# Patient Record
Sex: Female | Born: 1960 | Race: White | Hispanic: No | Marital: Single | Smoking: Former smoker
Health system: Southern US, Community
[De-identification: ages and names within clinical notes are randomized; demographics above are authoritative.]

## PROBLEM LIST (undated history)

## (undated) DIAGNOSIS — E785 Hyperlipidemia, unspecified: Secondary | ICD-10-CM

## (undated) DIAGNOSIS — E119 Type 2 diabetes mellitus without complications: Secondary | ICD-10-CM

## (undated) DIAGNOSIS — I214 Non-ST elevation (NSTEMI) myocardial infarction: Secondary | ICD-10-CM

## (undated) DIAGNOSIS — I10 Essential (primary) hypertension: Secondary | ICD-10-CM

## (undated) DIAGNOSIS — I2510 Atherosclerotic heart disease of native coronary artery without angina pectoris: Secondary | ICD-10-CM

---

## 1998-12-13 ENCOUNTER — Emergency Department (HOSPITAL_COMMUNITY): Admission: EM | Admit: 1998-12-13 | Discharge: 1998-12-13 | Payer: Self-pay | Admitting: Emergency Medicine

## 2002-05-14 ENCOUNTER — Ambulatory Visit (HOSPITAL_BASED_OUTPATIENT_CLINIC_OR_DEPARTMENT_OTHER): Admission: RE | Admit: 2002-05-14 | Discharge: 2002-05-14 | Payer: Self-pay | Admitting: *Deleted

## 2003-08-23 ENCOUNTER — Emergency Department (HOSPITAL_COMMUNITY): Admission: EM | Admit: 2003-08-23 | Discharge: 2003-08-23 | Payer: Self-pay | Admitting: Emergency Medicine

## 2003-08-23 ENCOUNTER — Encounter: Payer: Self-pay | Admitting: Emergency Medicine

## 2016-12-08 ENCOUNTER — Encounter (HOSPITAL_COMMUNITY): Payer: Self-pay

## 2016-12-08 ENCOUNTER — Emergency Department (HOSPITAL_COMMUNITY)
Admission: EM | Admit: 2016-12-08 | Discharge: 2016-12-09 | Disposition: A | Discharge status: Home / Self Care | Payer: Medicaid Other | Attending: Emergency Medicine | Admitting: Emergency Medicine

## 2016-12-08 ENCOUNTER — Emergency Department (HOSPITAL_COMMUNITY): Payer: Medicaid Other

## 2016-12-08 DIAGNOSIS — R079 Chest pain, unspecified: Secondary | ICD-10-CM | POA: Diagnosis not present

## 2016-12-08 DIAGNOSIS — I10 Essential (primary) hypertension: Secondary | ICD-10-CM | POA: Diagnosis not present

## 2016-12-08 DIAGNOSIS — E119 Type 2 diabetes mellitus without complications: Secondary | ICD-10-CM | POA: Insufficient documentation

## 2016-12-08 DIAGNOSIS — Z79899 Other long term (current) drug therapy: Secondary | ICD-10-CM | POA: Insufficient documentation

## 2016-12-08 DIAGNOSIS — K2100 Gastro-esophageal reflux disease with esophagitis, without bleeding: Secondary | ICD-10-CM

## 2016-12-08 DIAGNOSIS — Z7984 Long term (current) use of oral hypoglycemic drugs: Secondary | ICD-10-CM | POA: Insufficient documentation

## 2016-12-08 DIAGNOSIS — Z7982 Long term (current) use of aspirin: Secondary | ICD-10-CM | POA: Diagnosis not present

## 2016-12-08 DIAGNOSIS — R1013 Epigastric pain: Secondary | ICD-10-CM | POA: Diagnosis present

## 2016-12-08 DIAGNOSIS — F17200 Nicotine dependence, unspecified, uncomplicated: Secondary | ICD-10-CM | POA: Insufficient documentation

## 2016-12-08 HISTORY — DX: Type 2 diabetes mellitus without complications: E11.9

## 2016-12-08 HISTORY — DX: Essential (primary) hypertension: I10

## 2016-12-08 LAB — BASIC METABOLIC PANEL
Anion gap: 10 (ref 5–15)
BUN: 13 mg/dL (ref 6–20)
CALCIUM: 9.2 mg/dL (ref 8.9–10.3)
CO2: 23 mmol/L (ref 22–32)
Chloride: 103 mmol/L (ref 101–111)
Creatinine, Ser: 1.03 mg/dL — ABNORMAL HIGH (ref 0.44–1.00)
GFR calc Af Amer: >60 mL/min (ref >60)
GLUCOSE: 203 mg/dL — AB (ref 65–99)
Potassium: 3.6 mmol/L (ref 3.5–5.1)
SODIUM: 136 mmol/L (ref 135–145)

## 2016-12-08 LAB — I-STAT TROPONIN, ED
TROPONIN I, POC: 0 ng/mL (ref 0.00–0.08)
Troponin i, poc: 0 ng/mL (ref 0.00–0.08)

## 2016-12-08 LAB — CBC
HEMATOCRIT: 38.4 % (ref 36.0–46.0)
Hemoglobin: 12.9 g/dL (ref 12.0–15.0)
MCH: 29 pg (ref 26.0–34.0)
MCHC: 33.6 g/dL (ref 30.0–36.0)
MCV: 86.3 fL (ref 78.0–100.0)
PLATELETS: 177 10*3/uL (ref 150–400)
RBC: 4.45 MIL/uL (ref 3.87–5.11)
RDW: 14.6 % (ref 11.5–15.5)
WBC: 9.2 10*3/uL (ref 4.0–10.5)

## 2016-12-08 MED ORDER — ACETAMINOPHEN 325 MG PO TABS
650.0000 mg | ORAL_TABLET | Freq: Once | ORAL | Status: AC
  Administered 2016-12-09: 650 mg via ORAL
  Filled 2016-12-08: qty 2

## 2016-12-08 MED ORDER — GI COCKTAIL ~~LOC~~
30.0000 mL | Freq: Once | ORAL | Status: AC
  Administered 2016-12-09: 30 mL via ORAL
  Filled 2016-12-08: qty 30

## 2016-12-08 MED ORDER — NITROGLYCERIN 0.4 MG SL SUBL: 0.4000 mg | SUBLINGUAL_TABLET | SUBLINGUAL | Status: DC | PRN

## 2016-12-08 NOTE — ED Provider Notes (Signed)
Medical screening examination/treatment/procedure(s) were conducted as a shared visit with non-physician practitioner(s) and myself.  I personally evaluated the patient during the encounter.   EKG Interpretation  Date/Time:  Wednesday December 08 2016 19:38:48 EST Ventricular Rate:  61 PR Interval:  174 QRS Duration: 82 QT Interval:  422 QTC Calculation: 424 R Axis:   9 Text Interpretation:  Normal sinus rhythm Normal ECG No significant change since last tracing Confirmed by Paitlyn Mcclatchey,  DO, Cataleia Gade [phone]([id number]) on 12/08/2016 11:14:43 PM      Pt is a 56 y.o. female with history of CAD status post stents on Plavix who presents emergency department with 2 days of epigastric abdominal pain that radiated into her left chest tonight at 7 PM. Described as a burning pain with nausea. No significant shortness of breath, diaphoresis. States this does not feel at all like her prior heart attack. No pressure, tightness. Has had some left arm pain but she does not feel that this is related to her chest pain. Patient's exam is unremarkable. Mildly bradycardic in the upper 40s intermittently but is on a beta blocker. Otherwise hemodynamically stable. Reports compliance with her Plavix. First troponin negative. Chest x-ray clear. EKG shows no new ischemic abnormality, arrhythmia or interval change. Plan is to repeat second troponin, treat any residual symptoms with GI cocktail. This seems less likely cardiac in nature. It is not pleuritic or exertional. If second troponin negative, pain resolved the patient still hemodynamically stable, plan is to discharge home with close cardiology follow-up. Patient and daughter at bedside are comfortable with this plan.   Layla MawKristen N Yaritza Leist, DO 12/08/16 2329

## 2016-12-08 NOTE — ED Provider Notes (Addendum)
[facility]-EMERGENCY DEPT Provider Note   CSN: [id number] Arrival date & time: 12/08/16  1932     History   Chief Complaint Chief Complaint  Patient presents with  . Chest Pain    HPI Alice Bennett is a 56 y.o. female.  Patient with history of NIDDM, MI (stents 2015), HLD, HTN, kidney mass, tobacco abuse presents with chest pain x 2 days. Describes pain was initially a burning type epigastric pain until tonight when it became a pressure-like, left sided, constant chest pain, now 5/10. She did not take anything at home for pain. No aggravating or alleviating factors.  She has nausea no vomiting, no headache, and no sob. She reports left arm aching x 2 days. She has a history of MI and reports current pain is not the same.    The history is provided by the patient. No language interpreter was used.    Past Medical History:  Diagnosis Date  . Diabetes mellitus without complication (HCC)   . Hypertension     There are no active problems to display for this patient.   History reviewed. No pertinent surgical history.  OB History    No data available       Home Medications    Prior to Admission medications   Medication Sig Start Date End Date Taking? Authorizing Provider  aspirin EC 81 MG tablet Take 81 mg by mouth daily.   Yes Historical Provider, MD  clonazePAM (KLONOPIN) 1 MG tablet Take 1 mg by mouth 2 (two) times daily.   Yes Historical Provider, MD  clopidogrel (PLAVIX) 75 MG tablet Take 75 mg by mouth daily.   Yes Historical Provider, MD  esomeprazole (NEXIUM) 20 MG capsule Take 40 mg by mouth daily at 12 noon.    Yes Historical Provider, MD  gabapentin (NEURONTIN) 300 MG capsule Take 300 mg by mouth 3 (three) times daily.   Yes Historical Provider, MD  hydrochlorothiazide (HYDRODIURIL) 25 MG tablet Take 25 mg by mouth daily.   Yes Historical Provider, MD  lisinopril (PRINIVIL,ZESTRIL) 5 MG tablet Take 5 mg by mouth daily.   Yes Historical Provider, MD  metFORMIN  (GLUCOPHAGE) 1000 MG tablet Take 1,000 mg by mouth 2 (two) times daily with a meal.   Yes Historical Provider, MD  metoprolol tartrate (LOPRESSOR) 25 MG tablet Take 25 mg by mouth 2 (two) times daily.   Yes Historical Provider, MD  nitroGLYCERIN (NITROSTAT) 0.4 MG SL tablet Place 0.4 mg under the tongue every 5 (five) minutes as needed for chest pain.   Yes Historical Provider, MD  pravastatin (PRAVACHOL) 40 MG tablet Take 40 mg by mouth every evening.   Yes Historical Provider, MD  sertraline (ZOLOFT) 100 MG tablet Take 100 mg by mouth daily.   Yes Historical Provider, MD    Family History History reviewed. No pertinent family history.  Social History Social History  Substance Use Topics  . Smoking status: Current Every Day Smoker  . Smokeless tobacco: Never Used  . Alcohol use No     Allergies   Codeine   Review of Systems Review of Systems  Constitutional: Negative for chills and fever.  HENT: Negative.   Respiratory: Negative.  Negative for cough and shortness of breath.   Cardiovascular: Positive for chest pain.  Gastrointestinal: Positive for abdominal pain and nausea. Negative for vomiting.  Musculoskeletal: Negative.   Skin: Negative.   Neurological: Positive for weakness.     Physical Exam Updated Vital Signs BP 135/72 (BP Location: Right  Arm)   Pulse (!) 58   Temp 98.2 F (36.8 C) (Oral)   Resp 18   Ht 5\' 9"  (1.753 m)   Wt 99.8 kg   SpO2 97%   BMI 32.49 kg/m   Physical Exam  Constitutional: She is oriented to person, place, and time. She appears well-developed and well-nourished. No distress.  HENT:  Head: Normocephalic.  Neck: Normal range of motion. Neck supple.  Cardiovascular: Normal rate and regular rhythm.   No murmur heard. Pulmonary/Chest: Effort normal and breath sounds normal. She has no wheezes. She has no rales. She exhibits no tenderness.  Abdominal: Soft. Bowel sounds are normal. There is no tenderness. There is no rebound and no  guarding.  Musculoskeletal: Normal range of motion. She exhibits no edema.  Neurological: She is alert and oriented to person, place, and time.  Skin: Skin is warm and dry. No rash noted.  Psychiatric: She has a normal mood and affect.     ED Treatments / Results  Labs (all labs ordered are listed, but only abnormal results are displayed) Labs Reviewed  BASIC METABOLIC PANEL - Abnormal; Notable for the following:       Result Value   Glucose, Bld 203 (*)    Creatinine, Ser 1.03 (*)    All other components within normal limits  CBC  I-STAT TROPOININ, ED   Results for orders placed or performed during the hospital encounter of 12/08/16  Basic metabolic panel  Result Value Ref Range   Sodium 136 135 - 145 mmol/L   Potassium 3.6 3.5 - 5.1 mmol/L   Chloride 103 101 - 111 mmol/L   CO2 23 22 - 32 mmol/L   Glucose, Bld 203 (H) 65 - 99 mg/dL   BUN 13 6 - 20 mg/dL   Creatinine, Ser 1.321.03 (H) 0.44 - 1.00 mg/dL   Calcium 9.2 8.9 - 44.010.3 mg/dL   GFR calc non Af Amer >60 >60 mL/min   GFR calc Af Amer >60 >60 mL/min   Anion gap 10 5 - 15  CBC  Result Value Ref Range   WBC 9.2 4.0 - 10.5 K/uL   RBC 4.45 3.87 - 5.11 MIL/uL   Hemoglobin 12.9 12.0 - 15.0 g/dL   HCT 10.238.4 72.536.0 - 36.646.0 %   MCV 86.3 78.0 - 100.0 fL   MCH 29.0 26.0 - 34.0 pg   MCHC 33.6 30.0 - 36.0 g/dL   RDW 44.014.6 34.711.5 - 42.515.5 %   Platelets 177 150 - 400 K/uL  I-stat troponin, ED  Result Value Ref Range   Troponin i, poc 0.00 0.00 - 0.08 ng/mL   Comment 3          I-Stat Troponin, ED [facility])  Result Value Ref Range   Troponin i, poc 0.00 0.00 - 0.08 ng/mL   Comment 3            EKG  EKG Interpretation None       Radiology Dg Chest 2 View  Result Date: 12/08/2016 CLINICAL DATA:  Acute onset of chest pain under both breasts, with burning and pain at the left biceps. Insomnia. EXAM: CHEST  2 VIEW COMPARISON:  Chest radiograph performed 08/03/2016 FINDINGS: The lungs are well-aerated and clear. There is no  evidence of focal opacification, pleural effusion or pneumothorax. The heart is normal in size; the mediastinal contour is within normal limits. No acute osseous abnormalities are seen. IMPRESSION: No acute cardiopulmonary process seen. Electronically Signed   By: Leotis ShamesJeffery  Chang M.D.   On: 12/08/2016 21:00    Procedures Procedures (including critical care time)  Medications Ordered in ED Medications - No data to display   Initial Impression / Assessment and Plan / ED Course  I have reviewed the triage vital signs and the nursing notes.  Pertinent labs & imaging results that were available during my care of the patient were reviewed by me and considered in my medical decision making (see chart for details).     Patient presents with pain initially described as epigastric burning, which continues but now extends to left chest. No SOB, diaphoresis. No vomiting though she has nausea.   EKG is non-ischemic, troponin and delta troponin are negative. GI cocktail with some relief. This is not felt to be ACS.  She is evaluated by Dr. Elesa Massed and is felt appropriate for discharge home. She is from [facility] and will be referred to cardiology to establish care. Recommend adding Pepcid or Zantac to current medication regimen.   Final Clinical Impressions(s) / ED Diagnoses   Final diagnoses:  None  1. Nonspecific chest pain.  New Prescriptions New Prescriptions   No medications on file     Elpidio Anis, PA-C 12/09/16 0019    Elpidio Anis, PA-C 12/22/16 0002    Layla Maw Ward, DO 12/23/16 2303

## 2016-12-08 NOTE — ED Triage Notes (Signed)
Pt complaining of L sided chest pain that radiates to L arm. Pt states hx of MI in 2016. Pt states some nausea, no vomiting. Pt states intermittent chest pain x 1 week, worsening today.

## 2017-06-09 IMAGING — DX DG CHEST 2V
2 series · 2 of 2 positions shown · non-contrast
Comparison: Chest radiograph performed 08/03/2016

CLINICAL DATA: Acute onset of chest pain under both breasts, with
burning and pain at the left biceps. Insomnia.

EXAM:
CHEST  2 VIEW

[w chest pa]
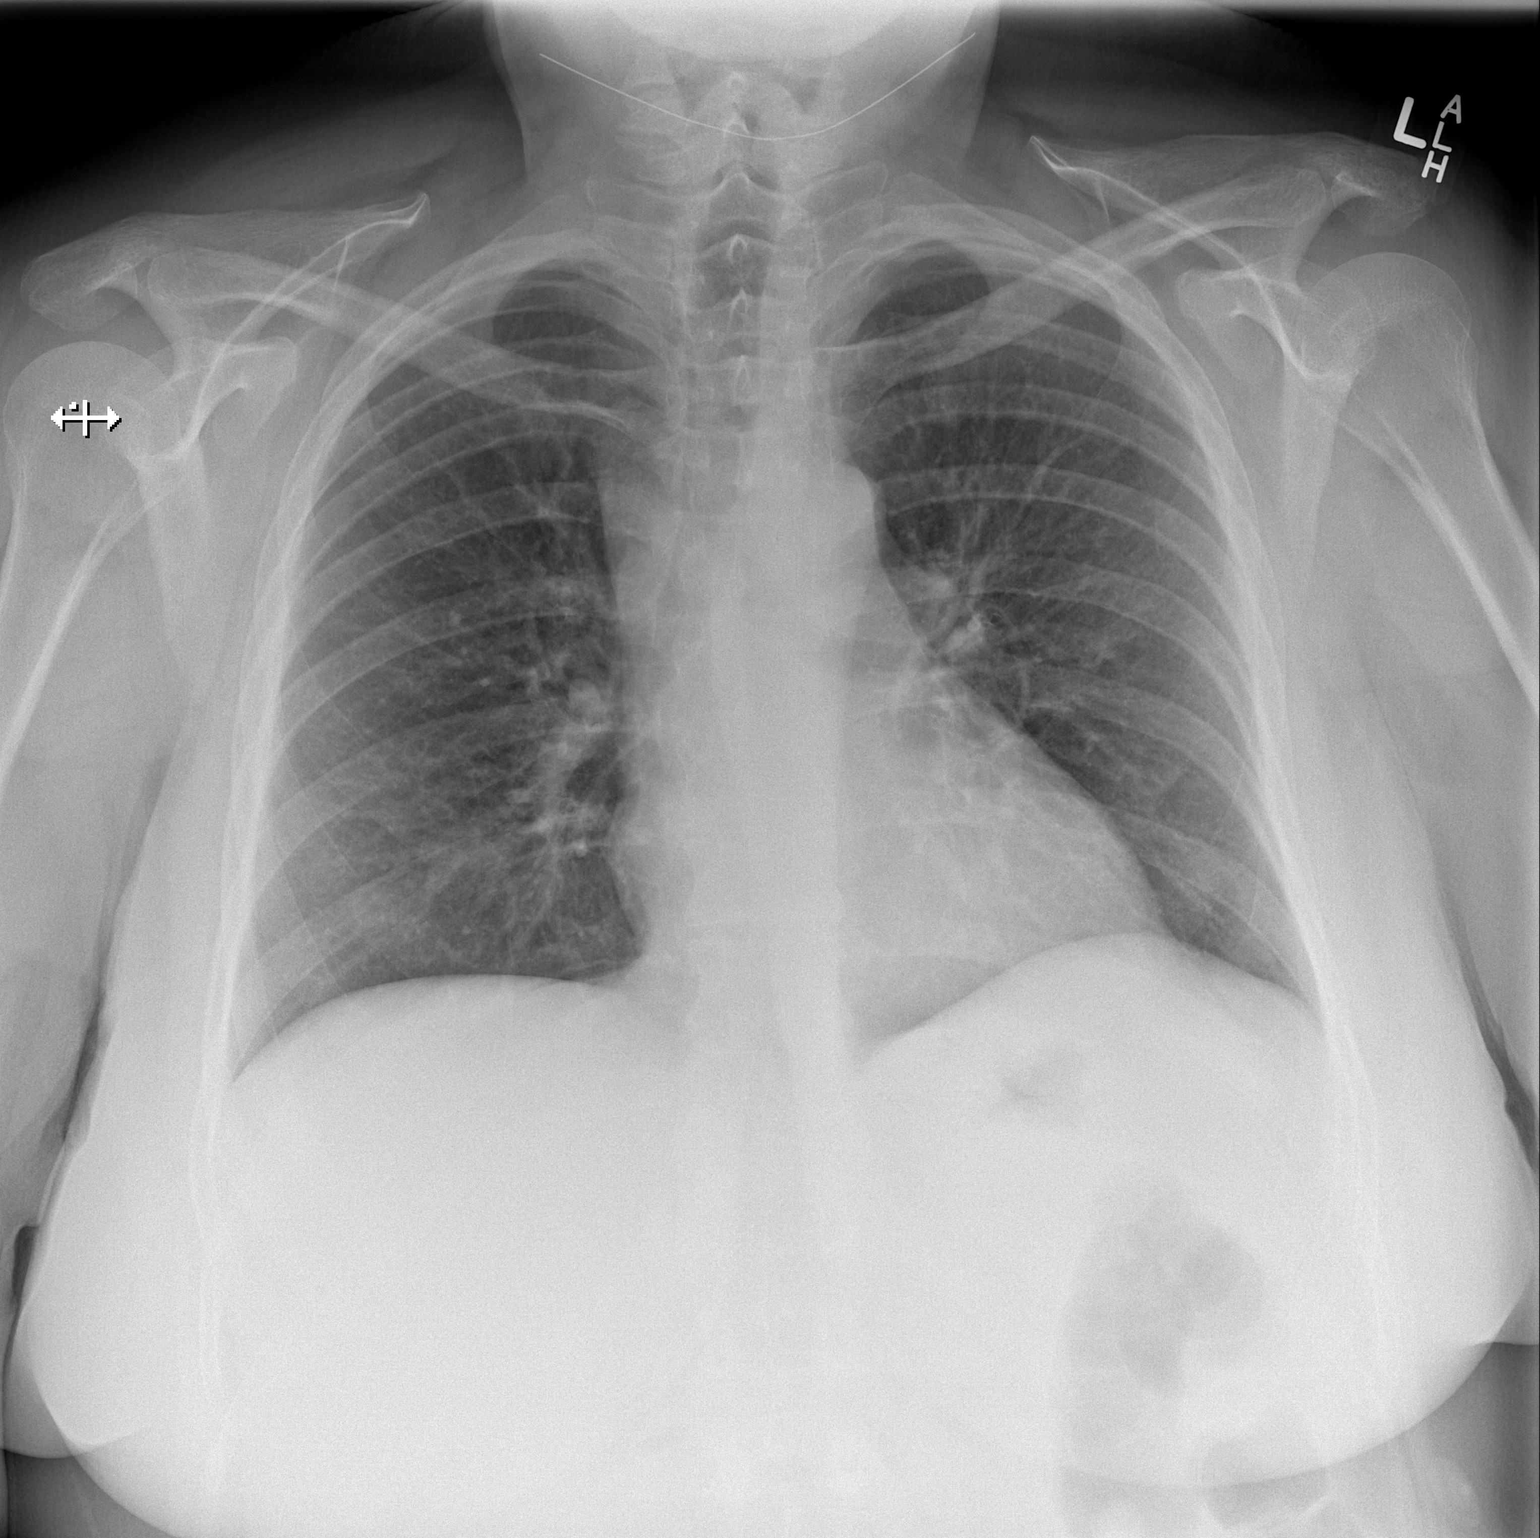

[w chest lat]
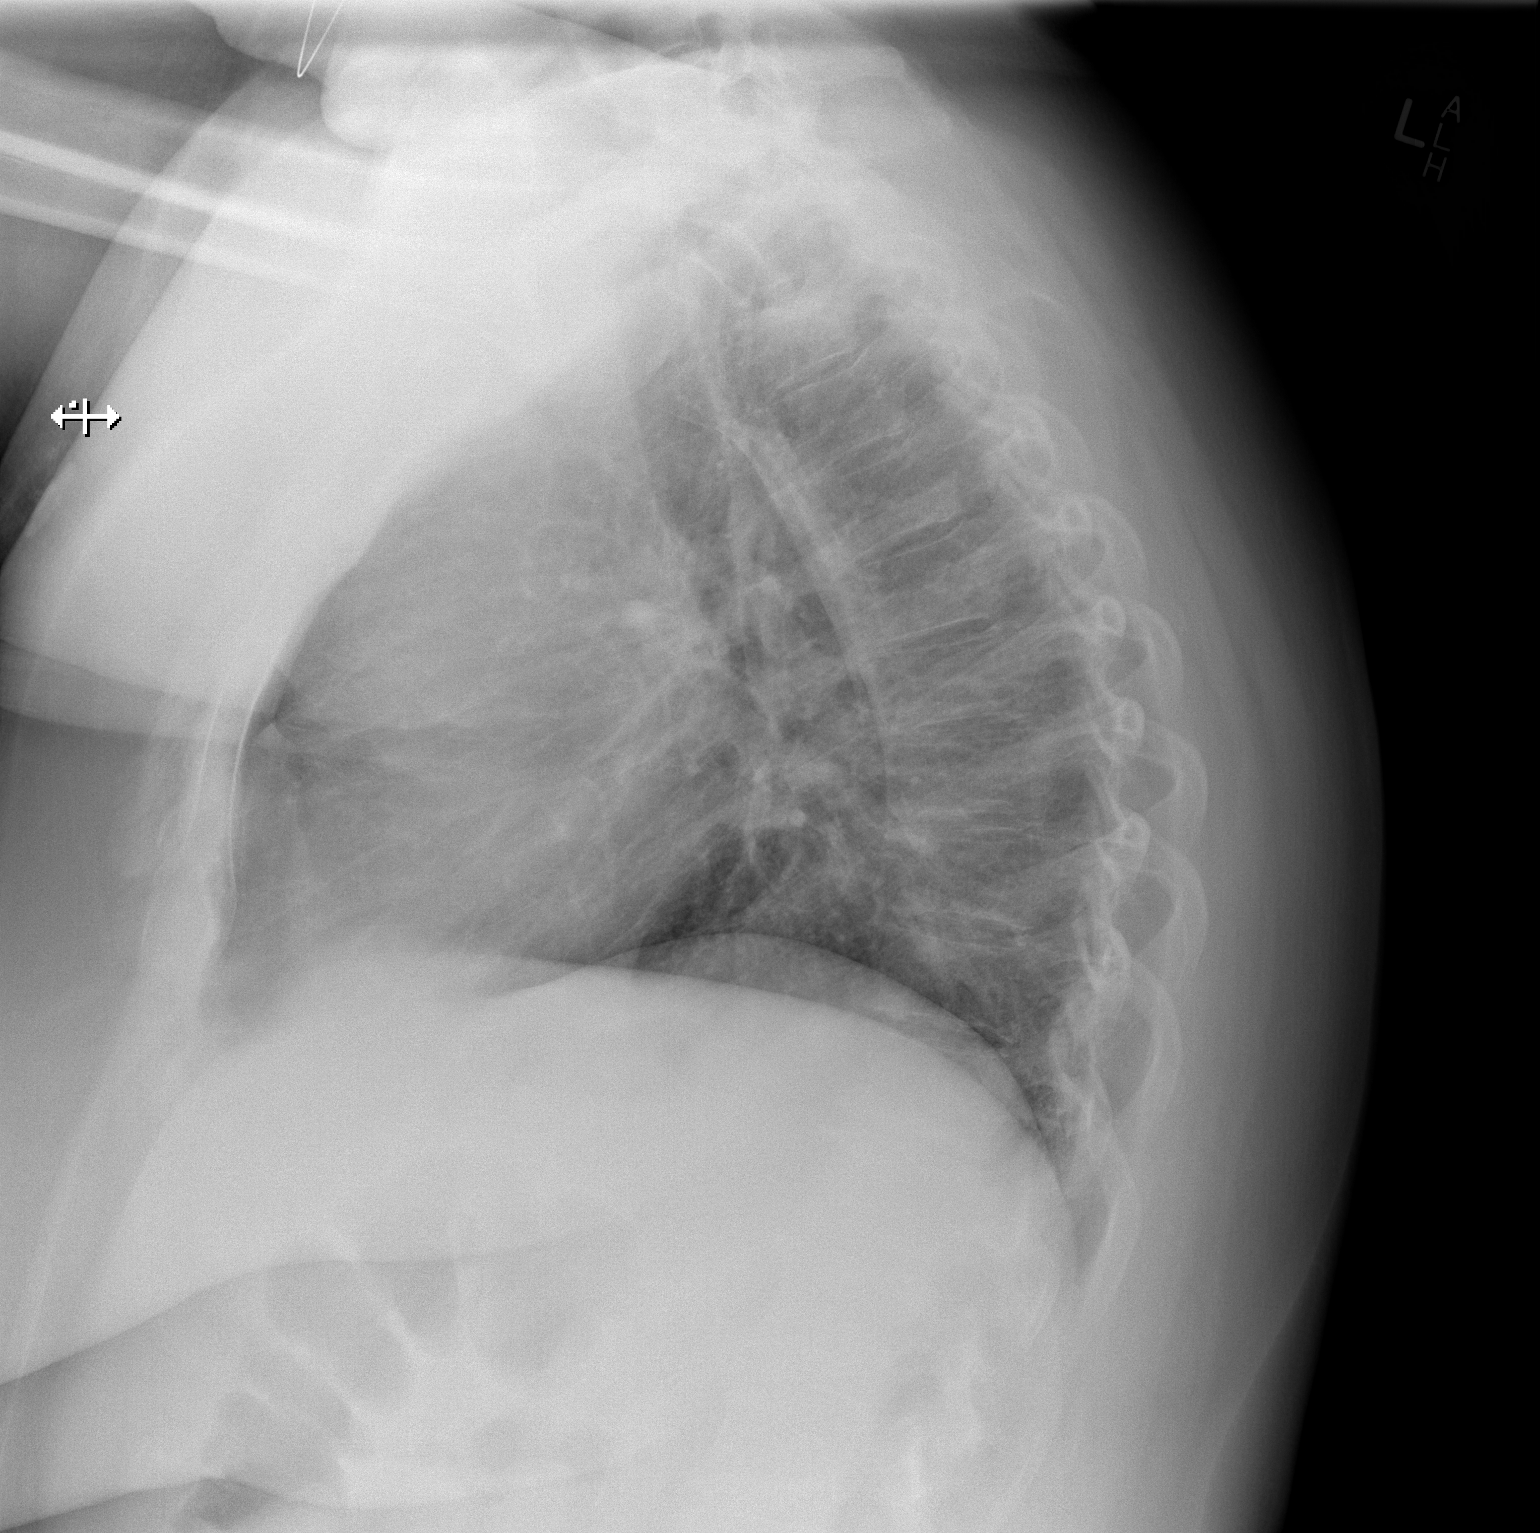

[2 of 2 positions shown; findings below may reference images not displayed]

FINDINGS: The lungs are well-aerated and clear. There is no evidence of focal
opacification, pleural effusion or pneumothorax.

The heart is normal in size; the mediastinal contour is within
normal limits. No acute osseous abnormalities are seen.
IMPRESSION: No acute cardiopulmonary process seen.

## 2017-07-09 ENCOUNTER — Inpatient Hospital Stay (HOSPITAL_COMMUNITY)
Admission: AD | Admit: 2017-07-09 | Discharge: 2017-07-12 | DRG: 247 | Disposition: A | Discharge status: Home / Self Care | Payer: Medicaid Other | Source: Other Acute Inpatient Hospital | Attending: Cardiovascular Disease | Admitting: Cardiovascular Disease

## 2017-07-09 ENCOUNTER — Encounter (HOSPITAL_COMMUNITY): Payer: Self-pay | Admitting: Cardiovascular Disease

## 2017-07-09 DIAGNOSIS — F419 Anxiety disorder, unspecified: Secondary | ICD-10-CM | POA: Diagnosis not present

## 2017-07-09 DIAGNOSIS — E1169 Type 2 diabetes mellitus with other specified complication: Secondary | ICD-10-CM

## 2017-07-09 DIAGNOSIS — Z7982 Long term (current) use of aspirin: Secondary | ICD-10-CM

## 2017-07-09 DIAGNOSIS — I5181 Takotsubo syndrome: Secondary | ICD-10-CM | POA: Diagnosis present

## 2017-07-09 DIAGNOSIS — I152 Hypertension secondary to endocrine disorders: Secondary | ICD-10-CM

## 2017-07-09 DIAGNOSIS — I10 Essential (primary) hypertension: Secondary | ICD-10-CM | POA: Diagnosis present

## 2017-07-09 DIAGNOSIS — Z6835 Body mass index (BMI) 35.0-35.9, adult: Secondary | ICD-10-CM

## 2017-07-09 DIAGNOSIS — Z955 Presence of coronary angioplasty implant and graft: Secondary | ICD-10-CM

## 2017-07-09 DIAGNOSIS — Z885 Allergy status to narcotic agent status: Secondary | ICD-10-CM

## 2017-07-09 DIAGNOSIS — Z7984 Long term (current) use of oral hypoglycemic drugs: Secondary | ICD-10-CM

## 2017-07-09 DIAGNOSIS — R079 Chest pain, unspecified: Secondary | ICD-10-CM

## 2017-07-09 DIAGNOSIS — J811 Chronic pulmonary edema: Secondary | ICD-10-CM | POA: Diagnosis not present

## 2017-07-09 DIAGNOSIS — W19XXXA Unspecified fall, initial encounter: Secondary | ICD-10-CM | POA: Diagnosis not present

## 2017-07-09 DIAGNOSIS — Z7902 Long term (current) use of antithrombotics/antiplatelets: Secondary | ICD-10-CM | POA: Diagnosis not present

## 2017-07-09 DIAGNOSIS — I214 Non-ST elevation (NSTEMI) myocardial infarction: Principal | ICD-10-CM

## 2017-07-09 DIAGNOSIS — E118 Type 2 diabetes mellitus with unspecified complications: Secondary | ICD-10-CM

## 2017-07-09 DIAGNOSIS — Z79899 Other long term (current) drug therapy: Secondary | ICD-10-CM | POA: Diagnosis not present

## 2017-07-09 DIAGNOSIS — I25110 Atherosclerotic heart disease of native coronary artery with unstable angina pectoris: Secondary | ICD-10-CM | POA: Diagnosis present

## 2017-07-09 DIAGNOSIS — E876 Hypokalemia: Secondary | ICD-10-CM | POA: Diagnosis present

## 2017-07-09 DIAGNOSIS — E785 Hyperlipidemia, unspecified: Secondary | ICD-10-CM

## 2017-07-09 DIAGNOSIS — E669 Obesity, unspecified: Secondary | ICD-10-CM | POA: Diagnosis present

## 2017-07-09 DIAGNOSIS — F17210 Nicotine dependence, cigarettes, uncomplicated: Secondary | ICD-10-CM | POA: Diagnosis present

## 2017-07-09 DIAGNOSIS — I255 Ischemic cardiomyopathy: Secondary | ICD-10-CM | POA: Diagnosis present

## 2017-07-09 DIAGNOSIS — E119 Type 2 diabetes mellitus without complications: Secondary | ICD-10-CM | POA: Diagnosis present

## 2017-07-09 DIAGNOSIS — I200 Unstable angina: Secondary | ICD-10-CM | POA: Diagnosis present

## 2017-07-09 DIAGNOSIS — E782 Mixed hyperlipidemia: Secondary | ICD-10-CM | POA: Diagnosis present

## 2017-07-09 DIAGNOSIS — Z8679 Personal history of other diseases of the circulatory system: Secondary | ICD-10-CM

## 2017-07-09 HISTORY — DX: Atherosclerotic heart disease of native coronary artery without angina pectoris: I25.10

## 2017-07-09 HISTORY — DX: Non-ST elevation (NSTEMI) myocardial infarction: I21.4

## 2017-07-09 HISTORY — DX: Hyperlipidemia, unspecified: E78.5

## 2017-07-09 LAB — CBC WITH DIFFERENTIAL/PLATELET
Basophils Absolute: 0 10*3/uL (ref 0.0–0.1)
Basophils Relative: 0 %
Eosinophils Absolute: 0.1 10*3/uL (ref 0.0–0.7)
Eosinophils Relative: 0 %
HCT: 35.4 % — ABNORMAL LOW (ref 36.0–46.0)
Hemoglobin: 11.7 g/dL — ABNORMAL LOW (ref 12.0–15.0)
Lymphocytes Relative: 23 %
Lymphs Abs: 2.6 10*3/uL (ref 0.7–4.0)
MCH: 28.5 pg (ref 26.0–34.0)
MCHC: 33.1 g/dL (ref 30.0–36.0)
MCV: 86.3 fL (ref 78.0–100.0)
Monocytes Absolute: 0.4 10*3/uL (ref 0.1–1.0)
Monocytes Relative: 4 %
Neutro Abs: 8.2 10*3/uL — ABNORMAL HIGH (ref 1.7–7.7)
Neutrophils Relative %: 73 %
Platelets: 180 10*3/uL (ref 150–400)
RBC: 4.1 MIL/uL (ref 3.87–5.11)
RDW: 14.7 % (ref 11.5–15.5)
WBC: 11.3 10*3/uL — ABNORMAL HIGH (ref 4.0–10.5)

## 2017-07-09 LAB — COMPREHENSIVE METABOLIC PANEL
ALT: 14 U/L (ref 14–54)
AST: 20 U/L (ref 15–41)
Albumin: 3.4 g/dL — ABNORMAL LOW (ref 3.5–5.0)
Alkaline Phosphatase: 53 U/L (ref 38–126)
Anion gap: 8 (ref 5–15)
BUN: 15 mg/dL (ref 6–20)
CO2: 24 mmol/L (ref 22–32)
Calcium: 9.1 mg/dL (ref 8.9–10.3)
Chloride: 106 mmol/L (ref 101–111)
Creatinine, Ser: 0.92 mg/dL (ref 0.44–1.00)
GFR calc Af Amer: >60 mL/min (ref >60)
GFR calc non Af Amer: >60 mL/min (ref >60)
Glucose, Bld: 109 mg/dL — ABNORMAL HIGH (ref 65–99)
Potassium: 3.6 mmol/L (ref 3.5–5.1)
Sodium: 138 mmol/L (ref 135–145)
Total Bilirubin: 0.3 mg/dL (ref 0.3–1.2)
Total Protein: 7.1 g/dL (ref 6.5–8.1)

## 2017-07-09 LAB — TROPONIN I
Troponin I: 0.35 ng/mL (ref <0.03)
Troponin I: 0.29 ng/mL (ref <0.03)

## 2017-07-09 LAB — HEPARIN LEVEL (UNFRACTIONATED): HEPARIN UNFRACTIONATED: 0.2 [IU]/mL — AB (ref 0.30–0.70)

## 2017-07-09 LAB — MRSA PCR SCREENING: MRSA BY PCR: NEGATIVE

## 2017-07-09 LAB — GLUCOSE, CAPILLARY: GLUCOSE-CAPILLARY: 136 mg/dL — AB (ref 65–99)

## 2017-07-09 MED ORDER — ACETAMINOPHEN 325 MG PO TABS
650.0000 mg | ORAL_TABLET | ORAL | Status: DC | PRN
  Administered 2017-07-09 – 2017-07-10 (×3): 650 mg via ORAL
  Filled 2017-07-09 (×3): qty 2

## 2017-07-09 MED ORDER — INSULIN ASPART 100 UNIT/ML ~~LOC~~ SOLN: 0.0000 [IU] | Freq: Every day | SUBCUTANEOUS | Status: DC

## 2017-07-09 MED ORDER — ASPIRIN 81 MG PO CHEW: 324.0000 mg | CHEWABLE_TABLET | ORAL | Status: DC

## 2017-07-09 MED ORDER — SERTRALINE HCL 100 MG PO TABS
100.0000 mg | ORAL_TABLET | Freq: Every day | ORAL | Status: DC
  Administered 2017-07-10 – 2017-07-12 (×3): 100 mg via ORAL
  Filled 2017-07-09 (×3): qty 1

## 2017-07-09 MED ORDER — PANTOPRAZOLE SODIUM 40 MG PO TBEC
40.0000 mg | DELAYED_RELEASE_TABLET | Freq: Every day | ORAL | Status: DC
  Administered 2017-07-10 – 2017-07-12 (×3): 40 mg via ORAL
  Filled 2017-07-09 (×3): qty 1

## 2017-07-09 MED ORDER — ASPIRIN 300 MG RE SUPP: 300.0000 mg | RECTAL | Status: DC

## 2017-07-09 MED ORDER — ONDANSETRON HCL 4 MG/2ML IJ SOLN: 4.0000 mg | Freq: Four times a day (QID) | INTRAMUSCULAR | Status: DC | PRN

## 2017-07-09 MED ORDER — ASPIRIN EC 81 MG PO TBEC
81.0000 mg | DELAYED_RELEASE_TABLET | Freq: Every day | ORAL | Status: DC
  Administered 2017-07-10: 81 mg via ORAL
  Filled 2017-07-09: qty 1

## 2017-07-09 MED ORDER — HEPARIN BOLUS VIA INFUSION
1000.0000 [IU] | Freq: Once | INTRAVENOUS | Status: AC
  Administered 2017-07-09: 1000 [IU] via INTRAVENOUS
  Filled 2017-07-09: qty 1000

## 2017-07-09 MED ORDER — NITROGLYCERIN IN D5W 200-5 MCG/ML-% IV SOLN
0.0000 ug/min | INTRAVENOUS | Status: DC
  Administered 2017-07-09: 15 ug/min via INTRAVENOUS
  Filled 2017-07-09: qty 250

## 2017-07-09 MED ORDER — HYDROCHLOROTHIAZIDE 25 MG PO TABS
25.0000 mg | ORAL_TABLET | Freq: Every day | ORAL | Status: DC
  Administered 2017-07-10: 25 mg via ORAL
  Filled 2017-07-09: qty 1

## 2017-07-09 MED ORDER — ATORVASTATIN CALCIUM 80 MG PO TABS
80.0000 mg | ORAL_TABLET | Freq: Every day | ORAL | Status: DC
  Administered 2017-07-09 – 2017-07-11 (×3): 80 mg via ORAL
  Filled 2017-07-09 (×3): qty 1

## 2017-07-09 MED ORDER — CLONAZEPAM 0.5 MG PO TABS
1.0000 mg | ORAL_TABLET | Freq: Two times a day (BID) | ORAL | Status: DC
  Administered 2017-07-09 – 2017-07-12 (×6): 1 mg via ORAL
  Filled 2017-07-09: qty 1
  Filled 2017-07-09: qty 2
  Filled 2017-07-09 (×2): qty 1
  Filled 2017-07-09: qty 2
  Filled 2017-07-09: qty 1

## 2017-07-09 MED ORDER — INSULIN ASPART 100 UNIT/ML ~~LOC~~ SOLN
0.0000 [IU] | Freq: Three times a day (TID) | SUBCUTANEOUS | Status: DC
  Administered 2017-07-10: 3 [IU] via SUBCUTANEOUS

## 2017-07-09 MED ORDER — GABAPENTIN 300 MG PO CAPS
300.0000 mg | ORAL_CAPSULE | Freq: Three times a day (TID) | ORAL | Status: DC
  Administered 2017-07-09 – 2017-07-10 (×3): 300 mg via ORAL
  Filled 2017-07-09 (×3): qty 1

## 2017-07-09 MED ORDER — PNEUMOCOCCAL VAC POLYVALENT 25 MCG/0.5ML IJ INJ: 0.5000 mL | INJECTION | INTRAMUSCULAR | Status: DC | PRN

## 2017-07-09 MED ORDER — ORAL CARE MOUTH RINSE
15.0000 mL | Freq: Two times a day (BID) | OROMUCOSAL | Status: DC
  Administered 2017-07-10 – 2017-07-12 (×3): 15 mL via OROMUCOSAL

## 2017-07-09 MED ORDER — CLOPIDOGREL BISULFATE 75 MG PO TABS
75.0000 mg | ORAL_TABLET | Freq: Every day | ORAL | Status: DC
  Administered 2017-07-10 – 2017-07-12 (×3): 75 mg via ORAL
  Filled 2017-07-09 (×3): qty 1

## 2017-07-09 MED ORDER — METOPROLOL TARTRATE 25 MG PO TABS
25.0000 mg | ORAL_TABLET | Freq: Two times a day (BID) | ORAL | Status: DC
  Administered 2017-07-10: 25 mg via ORAL
  Filled 2017-07-09 (×2): qty 1

## 2017-07-09 MED ORDER — ASPIRIN EC 81 MG PO TBEC: 81.0000 mg | DELAYED_RELEASE_TABLET | Freq: Every day | ORAL | Status: DC

## 2017-07-09 MED ORDER — ALPRAZOLAM 0.25 MG PO TABS
0.2500 mg | ORAL_TABLET | Freq: Two times a day (BID) | ORAL | Status: DC | PRN
  Administered 2017-07-10: 0.25 mg via ORAL
  Filled 2017-07-09: qty 1

## 2017-07-09 MED ORDER — HEPARIN (PORCINE) IN NACL 100-0.45 UNIT/ML-% IJ SOLN
1550.0000 [IU]/h | INTRAMUSCULAR | Status: DC
  Administered 2017-07-09: 1300 [IU]/h via INTRAVENOUS
  Administered 2017-07-10: 1550 [IU]/h via INTRAVENOUS
  Filled 2017-07-09 (×2): qty 250

## 2017-07-09 MED ORDER — ZOLPIDEM TARTRATE 5 MG PO TABS: 5.0000 mg | ORAL_TABLET | Freq: Every evening | ORAL | Status: DC | PRN

## 2017-07-09 MED ORDER — LISINOPRIL 5 MG PO TABS
5.0000 mg | ORAL_TABLET | Freq: Every day | ORAL | Status: DC
  Administered 2017-07-10 – 2017-07-12 (×3): 5 mg via ORAL
  Filled 2017-07-09 (×3): qty 1

## 2017-07-09 MED ORDER — NITROGLYCERIN 0.4 MG SL SUBL
0.4000 mg | SUBLINGUAL_TABLET | SUBLINGUAL | Status: DC | PRN
  Administered 2017-07-10: 0.4 mg via SUBLINGUAL
  Filled 2017-07-09: qty 1

## 2017-07-09 NOTE — Progress Notes (Signed)
ANTICOAGULATION CONSULT NOTE - Initial Consult  Pharmacy Consult for heparin Indication: chest pain/ACS  Allergies  Allergen Reactions  . Codeine Itching    Patient Measurements: Height: 5\' 9"  (175.3 cm) Weight: 238 lb 1.6 oz (108 kg) IBW/kg (Calculated) : 66.2 Heparin Dosing Weight: 90kg  Vital Signs: Temp: 98.6 F (37 C) (09/01 1400) Temp Source: Oral (09/01 1400) BP: 104/64 (09/01 1445) Pulse Rate: 54 (09/01 1445)  Labs: No results for input(s): HGB, HCT, PLT, APTT, LABPROT, INR, HEPARINUNFRC, HEPRLOWMOCWT, CREATININE, CKTOTAL, CKMB, TROPONINI in the last 72 hours.  CrCl cannot be calculated (Patient's most recent lab result is older than the maximum 21 days allowed.).   Medical History: Past Medical History:  Diagnosis Date  . Diabetes mellitus without complication (HCC)   . Hypertension     Assessment: [facility]. Heparin currently running at 1300 units/hr from OSH. Will check level at 1800 and adjust accordingly.    Goal of Therapy:  Heparin level 0.3-0.7 units/ml Monitor platelets by anticoagulation protocol: Yes   Plan:  Continue heparin at 1300 units/hr Check heparin level tonight  Sheppard CoilFrank Trudee Chirino PharmD., BCPS Clinical Pharmacist Pager [phone] 07/09/2017 3:09 PM
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
## 2017-07-09 NOTE — Progress Notes (Signed)
ANTICOAGULATION CONSULT NOTE   Pharmacy Consult for heparin Indication: chest pain/ACS  Allergies  Allergen Reactions  . Codeine Itching    Patient Measurements: Height: 5\' 9"  (175.3 cm) Weight: 238 lb 1.6 oz (108 kg) IBW/kg (Calculated) : 66.2 Heparin Dosing Weight: 90kg  Vital Signs: Temp: 98.8 F (37.1 C) (09/01 1600) Temp Source: Oral (09/01 1600) BP: 119/76 (09/01 1700) Pulse Rate: 51 (09/01 1700)  Labs:  Recent Labs  07/09/17 1643  HGB 11.7*  HCT 35.4*  PLT 180  HEPARINUNFRC 0.20*    CrCl cannot be calculated (Patient's most recent lab result is older than the maximum 21 days allowed.).   Medical History: Past Medical History:  Diagnosis Date  . CAD (coronary artery disease)   . Diabetes mellitus without complication (HCC)   . Hyperlipidemia   . Hypertension     Assessment: [facility] on heparin. The initial heparin level is 0.2 and below goal.   Goal of Therapy:  Heparin level 0.3-0.7 units/ml Monitor platelets by anticoagulation protocol: Yes   Plan:  -heparin 1000 unit bolus and increase to 1550 units/hr -Heparin level in 6 hours and daily wth CBC daily  Harland Germanndrew Welma Mccombs, Pharm D 07/09/2017 5:35 PM

## 2017-07-10 ENCOUNTER — Inpatient Hospital Stay (HOSPITAL_COMMUNITY): Payer: Medicaid Other

## 2017-07-10 ENCOUNTER — Other Ambulatory Visit: Payer: Self-pay

## 2017-07-10 ENCOUNTER — Encounter (HOSPITAL_COMMUNITY)
Admission: AD | Disposition: A | Discharge status: Home / Self Care | Payer: Self-pay | Source: Other Acute Inpatient Hospital | Attending: Cardiovascular Disease

## 2017-07-10 DIAGNOSIS — I255 Ischemic cardiomyopathy: Secondary | ICD-10-CM

## 2017-07-10 DIAGNOSIS — I2510 Atherosclerotic heart disease of native coronary artery without angina pectoris: Secondary | ICD-10-CM

## 2017-07-10 DIAGNOSIS — I5030 Unspecified diastolic (congestive) heart failure: Secondary | ICD-10-CM

## 2017-07-10 DIAGNOSIS — I214 Non-ST elevation (NSTEMI) myocardial infarction: Secondary | ICD-10-CM

## 2017-07-10 HISTORY — PX: LEFT HEART CATH AND CORONARY ANGIOGRAPHY: CATH118249

## 2017-07-10 HISTORY — PX: INTRAVASCULAR PRESSURE WIRE/FFR STUDY: CATH118243

## 2017-07-10 HISTORY — PX: CORONARY STENT INTERVENTION: CATH118234

## 2017-07-10 LAB — HEPARIN LEVEL (UNFRACTIONATED)
HEPARIN UNFRACTIONATED: 0.39 [IU]/mL (ref 0.30–0.70)
Heparin Unfractionated: 0.43 IU/mL (ref 0.30–0.70)

## 2017-07-10 LAB — CBC
HCT: 36.1 % (ref 36.0–46.0)
Hemoglobin: 11.7 g/dL — ABNORMAL LOW (ref 12.0–15.0)
MCH: 28.1 pg (ref 26.0–34.0)
MCHC: 32.4 g/dL (ref 30.0–36.0)
MCV: 86.6 fL (ref 78.0–100.0)
PLATELETS: 146 10*3/uL — AB (ref 150–400)
RBC: 4.17 MIL/uL (ref 3.87–5.11)
RDW: 14.8 % (ref 11.5–15.5)
WBC: 7.9 10*3/uL (ref 4.0–10.5)

## 2017-07-10 LAB — PROTIME-INR
INR: 0.93
Prothrombin Time: 12.4 seconds (ref 11.4–15.2)

## 2017-07-10 LAB — GLUCOSE, CAPILLARY
GLUCOSE-CAPILLARY: 104 mg/dL — AB (ref 65–99)
GLUCOSE-CAPILLARY: 153 mg/dL — AB (ref 65–99)
GLUCOSE-CAPILLARY: 151 mg/dL — AB (ref 65–99)
Glucose-Capillary: 173 mg/dL — ABNORMAL HIGH (ref 65–99)
Glucose-Capillary: 156 mg/dL — ABNORMAL HIGH (ref 65–99)

## 2017-07-10 LAB — TROPONIN I
Troponin I: 0.15 ng/mL (ref <0.03)
Troponin I: 0.18 ng/mL (ref <0.03)
Troponin I: 0.48 ng/mL (ref <0.03)

## 2017-07-10 LAB — HIV ANTIBODY (ROUTINE TESTING W REFLEX): HIV Screen 4th Generation wRfx: NONREACTIVE

## 2017-07-10 SURGERY — LEFT HEART CATH AND CORONARY ANGIOGRAPHY
Anesthesia: LOCAL

## 2017-07-10 MED ORDER — HEPARIN SODIUM (PORCINE) 1000 UNIT/ML IJ SOLN
INTRAMUSCULAR | Status: AC
  Filled 2017-07-10: qty 1

## 2017-07-10 MED ORDER — MORPHINE SULFATE (PF) 4 MG/ML IV SOLN
2.0000 mg | INTRAVENOUS | Status: DC | PRN
  Administered 2017-07-10 (×2): 4 mg via INTRAVENOUS
  Filled 2017-07-10 (×2): qty 1

## 2017-07-10 MED ORDER — IOPAMIDOL (ISOVUE-370) INJECTION 76%
INTRAVENOUS | Status: DC | PRN
  Administered 2017-07-10: 155 mL via INTRA_ARTERIAL

## 2017-07-10 MED ORDER — IOPAMIDOL (ISOVUE-370) INJECTION 76%
INTRAVENOUS | Status: AC
  Filled 2017-07-10: qty 100

## 2017-07-10 MED ORDER — HYDRALAZINE HCL 20 MG/ML IJ SOLN
INTRAMUSCULAR | Status: DC | PRN
  Administered 2017-07-10: 10 mg via INTRAVENOUS

## 2017-07-10 MED ORDER — LIDOCAINE HCL (PF) 1 % IJ SOLN
INTRAMUSCULAR | Status: AC
  Filled 2017-07-10: qty 30

## 2017-07-10 MED ORDER — FENTANYL CITRATE (PF) 100 MCG/2ML IJ SOLN
INTRAMUSCULAR | Status: AC
  Filled 2017-07-10: qty 2

## 2017-07-10 MED ORDER — MIDAZOLAM HCL 2 MG/2ML IJ SOLN
INTRAMUSCULAR | Status: DC | PRN
  Administered 2017-07-10: 1 mg via INTRAVENOUS

## 2017-07-10 MED ORDER — ADENOSINE 12 MG/4ML IV SOLN
INTRAVENOUS | Status: AC
  Filled 2017-07-10: qty 16

## 2017-07-10 MED ORDER — FUROSEMIDE 10 MG/ML IJ SOLN
INTRAMUSCULAR | Status: AC
  Filled 2017-07-10: qty 4

## 2017-07-10 MED ORDER — LABETALOL HCL 5 MG/ML IV SOLN
10.0000 mg | INTRAVENOUS | Status: AC | PRN
Start: 2017-07-10 — End: 2017-07-10

## 2017-07-10 MED ORDER — CLOPIDOGREL BISULFATE 300 MG PO TABS
ORAL_TABLET | ORAL | Status: AC
  Filled 2017-07-10: qty 1

## 2017-07-10 MED ORDER — VERAPAMIL HCL 2.5 MG/ML IV SOLN
INTRAVENOUS | Status: DC | PRN
  Administered 2017-07-10: 11:00:00 via INTRA_ARTERIAL

## 2017-07-10 MED ORDER — ASPIRIN 81 MG PO CHEW: 81.0000 mg | CHEWABLE_TABLET | ORAL | Status: DC

## 2017-07-10 MED ORDER — HYDRALAZINE HCL 20 MG/ML IJ SOLN
INTRAMUSCULAR | Status: AC
  Filled 2017-07-10: qty 1

## 2017-07-10 MED ORDER — IOPAMIDOL (ISOVUE-370) INJECTION 76%
INTRAVENOUS | Status: AC
  Filled 2017-07-10: qty 50

## 2017-07-10 MED ORDER — ASPIRIN EC 81 MG PO TBEC
81.0000 mg | DELAYED_RELEASE_TABLET | Freq: Every day | ORAL | Status: DC
  Administered 2017-07-12: 81 mg via ORAL
  Filled 2017-07-10: qty 1

## 2017-07-10 MED ORDER — FUROSEMIDE 10 MG/ML IJ SOLN
INTRAMUSCULAR | Status: DC | PRN
  Administered 2017-07-10: 40 mg via INTRAVENOUS

## 2017-07-10 MED ORDER — SODIUM CHLORIDE 0.9% FLUSH: 3.0000 mL | Freq: Two times a day (BID) | INTRAVENOUS | Status: DC

## 2017-07-10 MED ORDER — PREGABALIN 75 MG PO CAPS
75.0000 mg | ORAL_CAPSULE | Freq: Two times a day (BID) | ORAL | Status: DC
  Administered 2017-07-10 – 2017-07-12 (×4): 75 mg via ORAL
  Filled 2017-07-10 (×4): qty 1

## 2017-07-10 MED ORDER — HYDRALAZINE HCL 20 MG/ML IJ SOLN: 5.0000 mg | INTRAMUSCULAR | Status: AC | PRN

## 2017-07-10 MED ORDER — MIDAZOLAM HCL 2 MG/2ML IJ SOLN
INTRAMUSCULAR | Status: AC
  Filled 2017-07-10: qty 2

## 2017-07-10 MED ORDER — SODIUM CHLORIDE 0.9% FLUSH
3.0000 mL | Freq: Two times a day (BID) | INTRAVENOUS | Status: DC
  Administered 2017-07-10 – 2017-07-12 (×4): 3 mL via INTRAVENOUS

## 2017-07-10 MED ORDER — HEPARIN SODIUM (PORCINE) 5000 UNIT/ML IJ SOLN
5000.0000 [IU] | Freq: Three times a day (TID) | INTRAMUSCULAR | Status: DC
  Administered 2017-07-10 – 2017-07-12 (×4): 5000 [IU] via SUBCUTANEOUS
  Filled 2017-07-10 (×4): qty 1

## 2017-07-10 MED ORDER — CLOPIDOGREL BISULFATE 300 MG PO TABS
ORAL_TABLET | ORAL | Status: DC | PRN
  Administered 2017-07-10: 600 mg via ORAL

## 2017-07-10 MED ORDER — SODIUM CHLORIDE 0.9 % IV SOLN: 250.0000 mL | INTRAVENOUS | Status: DC | PRN

## 2017-07-10 MED ORDER — LIDOCAINE HCL (PF) 1 % IJ SOLN
INTRAMUSCULAR | Status: DC | PRN
  Administered 2017-07-10: 2 mL

## 2017-07-10 MED ORDER — ONDANSETRON HCL 4 MG/2ML IJ SOLN
INTRAMUSCULAR | Status: DC | PRN
  Administered 2017-07-10: 4 mg via INTRAVENOUS

## 2017-07-10 MED ORDER — SODIUM CHLORIDE 0.9% FLUSH: 3.0000 mL | INTRAVENOUS | Status: DC | PRN

## 2017-07-10 MED ORDER — HEPARIN SODIUM (PORCINE) 1000 UNIT/ML IJ SOLN
INTRAMUSCULAR | Status: DC | PRN
  Administered 2017-07-10: 5000 [IU] via INTRAVENOUS
  Administered 2017-07-10: 3000 [IU] via INTRAVENOUS
  Administered 2017-07-10: 5000 [IU] via INTRAVENOUS

## 2017-07-10 MED ORDER — HEPARIN (PORCINE) IN NACL 2-0.9 UNIT/ML-% IJ SOLN
INTRAMUSCULAR | Status: DC | PRN
  Administered 2017-07-10: 11:00:00

## 2017-07-10 MED ORDER — VERAPAMIL HCL 2.5 MG/ML IV SOLN
INTRAVENOUS | Status: AC
  Filled 2017-07-10: qty 2

## 2017-07-10 MED ORDER — ADENOSINE (DIAGNOSTIC) 140MCG/KG/MIN
INTRAVENOUS | Status: DC | PRN
  Administered 2017-07-10: 140 ug/kg/min via INTRAVENOUS

## 2017-07-10 MED ORDER — SODIUM CHLORIDE 0.9% FLUSH
3.0000 mL | INTRAVENOUS | Status: DC | PRN
Start: 2017-07-10 — End: 2017-07-10

## 2017-07-10 MED ORDER — SODIUM CHLORIDE 0.9 % IV SOLN
INTRAVENOUS | Status: DC
  Administered 2017-07-10: 10:00:00 via INTRAVENOUS

## 2017-07-10 MED ORDER — HEPARIN (PORCINE) IN NACL 2-0.9 UNIT/ML-% IJ SOLN
INTRAMUSCULAR | Status: AC
  Filled 2017-07-10: qty 1000

## 2017-07-10 MED ORDER — INSULIN ASPART 100 UNIT/ML ~~LOC~~ SOLN
0.0000 [IU] | Freq: Three times a day (TID) | SUBCUTANEOUS | Status: DC
  Administered 2017-07-10 – 2017-07-12 (×6): 3 [IU] via SUBCUTANEOUS
  Administered 2017-07-12: 2 [IU] via SUBCUTANEOUS

## 2017-07-10 MED ORDER — NITROGLYCERIN 1 MG/10 ML FOR IR/CATH LAB
INTRA_ARTERIAL | Status: DC | PRN
  Administered 2017-07-10: 200 ug via INTRACORONARY

## 2017-07-10 MED ORDER — ONDANSETRON HCL 4 MG/2ML IJ SOLN
INTRAMUSCULAR | Status: AC
  Filled 2017-07-10: qty 2

## 2017-07-10 MED ORDER — FENTANYL CITRATE (PF) 100 MCG/2ML IJ SOLN
INTRAMUSCULAR | Status: DC | PRN
  Administered 2017-07-10 (×2): 25 ug via INTRAVENOUS

## 2017-07-10 SURGICAL SUPPLY — 18 items
BALLN SAPPHIRE 2.5X12 (BALLOONS) ×2
BALLN ~~LOC~~ EUPHORA RX 3.25X12 (BALLOONS) ×2
BALLOON SAPPHIRE 2.5X12 (BALLOONS) IMPLANT
BALLOON ~~LOC~~ EUPHORA RX 3.25X12 (BALLOONS) IMPLANT
CATH 5FR JL3.5 JR4 ANG PIG MP (CATHETERS) ×1 IMPLANT
CATH VISTA GUIDE 6FR XBLAD3.5 (CATHETERS) ×1 IMPLANT
DEVICE RAD COMP TR BAND LRG (VASCULAR PRODUCTS) ×1 IMPLANT
GLIDESHEATH SLEND SS 6F .021 (SHEATH) ×1 IMPLANT
GUIDEWIRE INQWIRE 1.5J.035X260 (WIRE) IMPLANT
GUIDEWIRE PRESSURE COMET II (WIRE) ×1 IMPLANT
INQWIRE 1.5J .035X260CM (WIRE) ×2
KIT ENCORE 26 ADVANTAGE (KITS) ×1 IMPLANT
KIT HEART LEFT (KITS) ×2 IMPLANT
PACK CARDIAC CATHETERIZATION (CUSTOM PROCEDURE TRAY) ×2 IMPLANT
STENT SIERRA 3.00 X 15 MM (Permanent Stent) ×1 IMPLANT
SYR MEDRAD MARK V 150ML (SYRINGE) ×2 IMPLANT
TRANSDUCER W/STOPCOCK (MISCELLANEOUS) ×2 IMPLANT
TUBING CIL FLEX 10 FLL-RA (TUBING) ×2 IMPLANT

## 2017-07-10 NOTE — Plan of Care (Signed)
Problem: Pain Managment: Goal: General experience of comfort will improve Outcome: Not Progressing Patient experiencing pain post cath, MD aware and orders for PRN medication received.

## 2017-07-10 NOTE — Progress Notes (Signed)
Pt c/o new onset chest pain (5/10), radiating to left arm- similar to what initially  caused her to seek medical attention. EKG performed, nitro increased as ordered, and VS obtained. Cards fellow on call paged. RN will cycle BP Q 15 min and closely monitor.

## 2017-07-10 NOTE — Progress Notes (Signed)
ANTICOAGULATION CONSULT NOTE   Pharmacy Consult for heparin Indication: chest pain/ACS  Allergies  Allergen Reactions  . Codeine Itching    Patient Measurements: Height: 5\' 9"  (175.3 cm) Weight: 236 lb 1.6 oz (107.1 kg) IBW/kg (Calculated) : 66.2 Heparin Dosing Weight: 90kg  Vital Signs: Temp: 98.7 F (37.1 C) (09/02 0837) Temp Source: Oral (09/02 0837) BP: 114/63 (09/02 0705) Pulse Rate: 68 (09/02 0705)  Labs:  Recent Labs  07/09/17 1643 07/09/17 2035 07/10/17 0034 07/10/17 0326 07/10/17 1007  HGB 11.7*  --   --  11.7*  --   HCT 35.4*  --   --  36.1  --   PLT 180  --   --  146*  --   HEPARINUNFRC 0.20*  --  0.43  --  0.39  CREATININE 0.92  --   --   --   --   TROPONINI 0.35* 0.29*  --  0.15*  --     Estimated Creatinine Clearance: 90.1 mL/min (by C-G formula based on SCr of 0.92 mg/dL).   Medical History: Past Medical History:  Diagnosis Date  . CAD (coronary artery disease)   . Diabetes mellitus without complication (HCC)   . Hyperlipidemia   . Hypertension     Assessment: [facility] heparin. Heparin level this am is at goal.   Plan is for ischemic eval this am. Will follow up post cath.  Goal of Therapy:  Heparin level 0.3-0.7 units/ml Monitor platelets by anticoagulation protocol: Yes   Sheppard CoilFrank Majorie Santee PharmD., BCPS Clinical Pharmacist Pager [phone] 07/10/2017 10:56 AM

## 2017-07-10 NOTE — Progress Notes (Signed)
ANTICOAGULATION CONSULT NOTE - Follow Up Consult  Pharmacy Consult for Heparin  Indication: chest pain/ACS  Allergies  Allergen Reactions  . Codeine Itching    Patient Measurements: Height: 5\' 9"  (175.3 cm) Weight: 238 lb 1.6 oz (108 kg) IBW/kg (Calculated) : 66.2  Vital Signs: Temp: 98.5 F (36.9 C) (09/01 2300) Temp Source: Oral (09/01 2300) BP: 109/64 (09/01 2300) Pulse Rate: 63 (09/01 2300)  Labs:  Recent Labs  07/09/17 1643 07/09/17 2035 07/10/17 0034  HGB 11.7*  --   --   HCT 35.4*  --   --   PLT 180  --   --   HEPARINUNFRC 0.20*  --  0.43  CREATININE 0.92  --   --   TROPONINI 0.35* 0.29*  --     Estimated Creatinine Clearance: 90.4 mL/min (by C-G formula based on SCr of 0.92 mg/dL).   Assessment: 56 y/o F on heparin for CP, plans for cath, heparin level is therapeutic after rate increase  Goal of Therapy:  Heparin level 0.3-0.7 units/ml Monitor platelets by anticoagulation protocol: Yes   Plan:  -Cont heparin at 1550 units/hr -1000 HL  Abran DukeLedford, Azeneth Carbonell 07/10/2017,3:21 AM

## 2017-07-10 NOTE — Progress Notes (Signed)
Spoke with Dr. Santiago Gladarncelli and updated on patient status. Pt currently reports she is chest pain free on nitro at 50mcgs. This incident of substernal chest pain with left arm radiation was percipated by a trip to the restroom. RN informed patient that next time she would have to use the bedpan or bedside commode. RN will continue to monitor- nitro order adjusted per MD to reflect current usage rate.
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
## 2017-07-10 NOTE — Interval H&P Note (Signed)
History and Physical Interval Note:  07/10/2017 11:14 AM  Alice SacksShirley F Locey  has presented today for surgery, with the diagnosis of STEMI  The various methods of treatment have been discussed with the patient and family. After consideration of risks, benefits and other options for treatment, the patient has consented to  Procedure(s): LEFT HEART CATH AND CORONARY ANGIOGRAPHY (N/A) as a surgical intervention .  The patient's history has been reviewed, patient examined, no change in status, stable for surgery.  I have reviewed the patient's chart and labs.  Questions were answered to the patient's satisfaction.   Cath Lab Visit (complete for each Cath Lab visit)  Clinical Evaluation Leading to the Procedure:   ACS: Yes.    Non-ACS:    Anginal Classification: CCS IV  Anti-ischemic medical therapy: Maximal Therapy (2 or more classes of medications)  Non-Invasive Test Results: No non-invasive testing performed  Prior CABG: No previous CABG        Theron Aristaeter [facility] ToledoJordanMD,FACC 07/10/2017 11:14 AM

## 2017-07-10 NOTE — Progress Notes (Signed)
  Echocardiogram 2D Echocardiogram has been performed.  Alice Bennett, Alice Bennett 07/10/2017, 6:17 PM

## 2017-07-11 ENCOUNTER — Encounter (HOSPITAL_COMMUNITY): Payer: Self-pay

## 2017-07-11 DIAGNOSIS — I5181 Takotsubo syndrome: Secondary | ICD-10-CM

## 2017-07-11 LAB — ECHOCARDIOGRAM COMPLETE
CHL CUP MV DEC (S): 257
E decel time: 257 msec
E/e' ratio: 9.72
FS: 36 % (ref 28–44)
Height: 69 in
IVS/LV PW RATIO, ED: 0.89
LA ID, A-P, ES: 38 mm
LA diam end sys: 38 mm
LA diam index: 1.63 cm/m2
LA vol index: 20.9 mL/m2
LA vol: 48.7 mL
LAVOLA4C: 41.1 mL
LV TDI E'LATERAL: 9.03
LV TDI E'MEDIAL: 5.33
LV sys vol: 60 mL — AB (ref 14–42)
LVDIAVOL: 103 mL (ref 46–106)
LVDIAVOLIN: 44 mL/m2
LVEEAVG: 9.72
LVEEMED: 9.72
LVELAT: 9.03 cm/s
LVOT SV: 97 mL
LVOT VTI: 25.4 cm
LVOT area: 3.8 cm2
LVOT diameter: 22 mm
LVOTPV: 111 cm/s
LVSYSVOLIN: 26 mL/m2
Lateral S' vel: 15.4 cm/s
MV Peak grad: 3 mmHg
MVPKAVEL: 62.1 m/s
MVPKEVEL: 87.8 m/s
PW: 9.2 mm — AB (ref 0.6–1.1)
RV TAPSE: 34 mm
Simpson's disk: 42
Stroke v: 43 ml
WEIGHTICAEL: 3777.6 [oz_av]

## 2017-07-11 LAB — GLUCOSE, CAPILLARY
GLUCOSE-CAPILLARY: 172 mg/dL — AB (ref 65–99)
GLUCOSE-CAPILLARY: 163 mg/dL — AB (ref 65–99)
Glucose-Capillary: 194 mg/dL — ABNORMAL HIGH (ref 65–99)

## 2017-07-11 LAB — CBC
HEMATOCRIT: 42.9 % (ref 36.0–46.0)
HEMOGLOBIN: 14 g/dL (ref 12.0–15.0)
MCH: 28.5 pg (ref 26.0–34.0)
MCHC: 32.6 g/dL (ref 30.0–36.0)
MCV: 87.2 fL (ref 78.0–100.0)
Platelets: 220 10*3/uL (ref 150–400)
RBC: 4.92 MIL/uL (ref 3.87–5.11)
RDW: 14.7 % (ref 11.5–15.5)
WBC: 12.1 10*3/uL — ABNORMAL HIGH (ref 4.0–10.5)

## 2017-07-11 LAB — BASIC METABOLIC PANEL
ANION GAP: 12 (ref 5–15)
BUN: 11 mg/dL (ref 6–20)
CALCIUM: 9.4 mg/dL (ref 8.9–10.3)
CO2: 27 mmol/L (ref 22–32)
Chloride: 96 mmol/L — ABNORMAL LOW (ref 101–111)
Creatinine, Ser: 0.94 mg/dL (ref 0.44–1.00)
GFR calc Af Amer: >60 mL/min (ref >60)
GFR calc non Af Amer: >60 mL/min (ref >60)
Glucose, Bld: 143 mg/dL — ABNORMAL HIGH (ref 65–99)
POTASSIUM: 4 mmol/L (ref 3.5–5.1)
SODIUM: 135 mmol/L (ref 135–145)

## 2017-07-11 MED ORDER — CARVEDILOL 6.25 MG PO TABS
6.2500 mg | ORAL_TABLET | Freq: Two times a day (BID) | ORAL | Status: DC
  Administered 2017-07-11 – 2017-07-12 (×3): 6.25 mg via ORAL
  Filled 2017-07-11 (×3): qty 1

## 2017-07-11 MED ORDER — SPIRONOLACTONE 25 MG PO TABS
12.5000 mg | ORAL_TABLET | Freq: Every day | ORAL | Status: DC
  Administered 2017-07-11 – 2017-07-12 (×2): 12.5 mg via ORAL
  Filled 2017-07-11 (×2): qty 1

## 2017-07-11 NOTE — Progress Notes (Signed)
Progress Note  Patient Name: Alice Bennett Date of Encounter: 07/11/2017  Primary Cardiologist: new  Subjective   Feels better; chest  Pain resolved  Inpatient Medications    Scheduled Meds: . [START ON 07/12/2017] aspirin EC  81 mg Oral Daily  . atorvastatin  80 mg Oral q1800  . clonazePAM  1 mg Oral BID  . clopidogrel  75 mg Oral Daily  . heparin  5,000 Units Subcutaneous Q8H  . insulin aspart  0-15 Units Subcutaneous TID WC  . insulin aspart  0-5 Units Subcutaneous QHS  . lisinopril  5 mg Oral Daily  . mouth rinse  15 mL Mouth Rinse BID  . metoprolol tartrate  25 mg Oral BID  . pantoprazole  40 mg Oral Daily  . pregabalin  75 mg Oral BID  . sertraline  100 mg Oral Daily  . sodium chloride flush  3 mL Intravenous Q12H   Continuous Infusions: . sodium chloride Stopped (Jul 26, 2017 1935)  . nitroGLYCERIN Stopped (07-26-17 1536)   PRN Meds: sodium chloride, acetaminophen, ALPRAZolam, morphine injection, nitroGLYCERIN, ondansetron (ZOFRAN) IV, pneumococcal 23 valent vaccine, sodium chloride flush, zolpidem   Vital Signs    Vitals:   07/11/17 0350 07/11/17 0423 07/11/17 0720 07/11/17 0724  BP:  120/68 117/72   Pulse:  65 (!) 57   Resp:  16 13 15   Temp: 99.1 F (37.3 C)  98.7 F (37.1 C)   TempSrc: Oral  Oral   SpO2:  97% 96% 93%  Weight:      Height:        Intake/Output Summary (Last 24 hours) at 07/11/17 0732 Last data filed at 07/11/17 0720  Gross per 24 hour  Intake           857.23 ml  Output             3450 ml  Net         -2592.77 ml    I/O since admission: -4349  Filed Weights   07/09/17 1400 07/26/17 1028  Weight: 238 lb 1.6 oz (108 kg) 236 lb 1.6 oz (107.1 kg)    Telemetry    Sinus 65 - Personally Reviewed  ECG   ECG (independently read by me): SB at 59; diffuse T wave inversion V1-6, I aVL, 2.aVF less pronounced  07-26-2017 ECG at 4:29 am (independently read by me): NSR at 67 with new deeply inverted T wave changes in V1-6 c/w LAD  ischemia but also T-wave changes in 1 and L, V2 and aVF.; QTc 541    Repeat ECG at 7:03 am (independently read by me): NSR at 65 persistent new anterolateral, lateral, and inferior deep T wave inversion; QTc 523   Repeat ECG obtained now 8:57 am (independently read by me): NSR at 63 persistent marked T wave normality; QTC interval 542 ms.  Physical Exam   BP 117/72 (BP Location: Left Wrist)   Pulse (!) 57   Temp 98.7 F (37.1 C) (Oral)   Resp 15   Ht 5\' 9"  (1.753 m)   Wt 236 lb 1.6 oz (107.1 kg)   SpO2 93%   BMI 34.87 kg/m  General: Alert, oriented, no distress.  Skin: normal turgor, no rashes, warm and dry HEENT: Normocephalic, atraumatic. Pupils equal round and reactive to light; sclera anicteric; extraocular muscles intact; Nose without nasal septal hypertrophy Mouth/Parynx benign; Mallinpatti scale 3 Neck: No JVD, no carotid bruits; normal carotid upstroke Lungs: clear to ausculatation and percussion; no wheezing or rales Chest  wall: without tenderness to palpitation Heart: PMI not displaced, RRR, s1 s2 normal, 1/6 systolic murmur, no diastolic murmur, no rubs, gallops, thrills, or heaves Abdomen: soft, nontender; no hepatosplenomehaly, BS+; abdominal aorta nontender and not dilated by palpation. Back: no CVA tenderness Pulses 2+ Musculoskeletal: full range of motion, normal strength, no joint deformities Extremities: no clubbing cyanosis or edema, Homan's sign negative  Neurologic: grossly nonfocal; Cranial nerves grossly wnl Psychologic: Normal mood and affect   Labs    Laboratory from [facility]: shows a BUN of 20, creatinine 0.9.  Glucose 179.  Hemoglobin 13.4/hematocrit 39.0/platelets 231.  Total cholesterol 232, triglycerides 420, HDL 34  Troponin I at 5:25 AM 0.48 and at 8:26 AM 0.73 on 07/09/2017  Chemistry  Recent Labs Lab 07/09/17 1643 07/11/17 0223  NA 138 135  K 3.6 4.0  CL 106 96*  CO2 24 27  GLUCOSE 109* 143*  BUN 15 11  CREATININE 0.92  0.94  CALCIUM 9.1 9.4  PROT 7.1  --   ALBUMIN 3.4*  --   AST 20  --   ALT 14  --   ALKPHOS 53  --   BILITOT 0.3  --   GFRNONAA >60 >60  GFRAA >60 >60  ANIONGAP 8 12     Hematology  Recent Labs Lab 07/09/17 1643 07/10/17 0326 07/11/17 0223  WBC 11.3* 7.9 12.1*  RBC 4.10 4.17 4.92  HGB 11.7* 11.7* 14.0  HCT 35.4* 36.1 42.9  MCV 86.3 86.6 87.2  MCH 28.5 28.1 28.5  MCHC 33.1 32.4 32.6  RDW 14.7 14.8 14.7  PLT 180 146* 220    Cardiac Enzymes  Recent Labs Lab 07/09/17 2035 07/10/17 0326 07/10/17 1007 07/10/17 2215  TROPONINI 0.29* 0.15* 0.18* 0.48*   No results for input(s): TROPIPOC in the last 168 hours.   BNPNo results for input(s): BNP, PROBNP in the last 168 hours.   DDimer No results for input(s): DDIMER in the last 168 hours.   Lipid Panel  No results found for: CHOL, TRIG, HDL, CHOLHDL, VLDL, LDLCALC, LDLDIRECT   Radiology    No results found.  Cardiac Studies   Echo at [facility] 07/09/17: EF 25%   Cardiac Cath Conclusion     Prox RCA lesion, 35 %stenosed.  Prox LAD lesion, 0 %stenosed.  Prox Cx to Mid Cx lesion, 50 %stenosed.  A STENT SIERRA 3.00 X 15 MM drug eluting stent was successfully placed, and does not overlap previously placed stent.  Mid LAD lesion, 70 %stenosed.  Post intervention, there is a 0% residual stenosis.  There is moderate left ventricular systolic dysfunction.  The left ventricular ejection fraction is 35-45% by visual estimate.  LV end diastolic pressure is severely elevated.   1. Single vessel obstructive CAD with 70% mid LAD. FFR 0.79. Stent in proximal LAD is patent 2. Severe LV dysfunction. Wall motion abnormality is more consistent with Takotsubo pattern 3. Markedly elevated LVEDP 4. Successful stenting of the mid LAD with DES.  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 The mid LAD stenosis was not critical and is unlikely to have caused  these findings. It was borderline obstructive with abnormal FFR so we did proceed with stenting of this lesion. Will continue DAPT for one year. IV diuresis. Optimize BP control with beta blocker and ACEi.          Post-Intervention Diagram    Patient Profile     ANTWANETTE WESCHE is a 56 y.o. female  with a history of CAD, hypertension, diabetes mellitus and hyperlipidemia who was [facility] following admission for chest pain with plans for cardiac catheterization  Assessment & Plan    1. Unstable angina/NSTEMI: the patient is status post bare-metal stenting of her mid LAD with a 3.015 mm MultiLink vision stent in June 2014.  She had experienced 2-3 months of progressive exertional chest pain and dyspnea.  [facility]  on 07/09/2017 showed an EF of 25%  On early monring yesterday night,after walking to the bathroom, she developed similar chest pain as the previous night and now her ECG shows marked new T-wave inversion anterolaterally V1-6  with T-wave inversion also in leads 1 and L, 2 and aVF.  Cath yesterday showed findings c/w Takotsubo cardiomyopathy but mid LAD lesion with FFR 0.79 for which she underwent PCI. Had recurrent pain post procedure; now pain free. Plan DAPT for a minimum of a year.  2.  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  Apical ballooning at cath c/w Takotsubo cardiomyopathy. However, the patient had noticed several months of progressive exertional angina and dyspnea leading to her hospitalization probably contributed by LAD stenosis. Will change metopolol to carvedilol 6.25 mg bid today. Continue lisinopril. Add spironolactone 12.5 mg daily.   3. Mixed hyperlipidemia:  now on atorvastatin 80 mg; may need combination therapy if triglycerides remain significantly elevated  4.  Essential hypertension: BP stable today with plan for med adjustment as noted   5.  Type 2 diabetes mellitus: Metformin on hold.  6.  Obesity: BMI 35.16.  Weight loss is imperative  Time spent: 35 minutes  Signed, Lennette Biharihomas A. Myeshia Fojtik, MD, [facility] 07/11/2017, 7:32 AM

## 2017-07-12 ENCOUNTER — Encounter (HOSPITAL_COMMUNITY): Payer: Self-pay | Admitting: Cardiology

## 2017-07-12 ENCOUNTER — Encounter (HOSPITAL_COMMUNITY)
Admission: AD | Disposition: A | Discharge status: Home / Self Care | Payer: Self-pay | Source: Other Acute Inpatient Hospital | Attending: Cardiovascular Disease

## 2017-07-12 DIAGNOSIS — E785 Hyperlipidemia, unspecified: Secondary | ICD-10-CM

## 2017-07-12 DIAGNOSIS — I10 Essential (primary) hypertension: Secondary | ICD-10-CM

## 2017-07-12 DIAGNOSIS — E1169 Type 2 diabetes mellitus with other specified complication: Secondary | ICD-10-CM

## 2017-07-12 DIAGNOSIS — I152 Hypertension secondary to endocrine disorders: Secondary | ICD-10-CM

## 2017-07-12 LAB — BASIC METABOLIC PANEL
ANION GAP: 10 (ref 5–15)
BUN: 24 mg/dL — ABNORMAL HIGH (ref 6–20)
CALCIUM: 8.8 mg/dL — AB (ref 8.9–10.3)
CHLORIDE: 97 mmol/L — AB (ref 101–111)
CO2: 27 mmol/L (ref 22–32)
Creatinine, Ser: 1.24 mg/dL — ABNORMAL HIGH (ref 0.44–1.00)
GFR calc Af Amer: 56 mL/min — ABNORMAL LOW (ref >60)
GFR calc non Af Amer: 48 mL/min — ABNORMAL LOW (ref >60)
GLUCOSE: 235 mg/dL — AB (ref 65–99)
POTASSIUM: 3.4 mmol/L — AB (ref 3.5–5.1)
Sodium: 134 mmol/L — ABNORMAL LOW (ref 135–145)

## 2017-07-12 LAB — GLUCOSE, CAPILLARY
GLUCOSE-CAPILLARY: 177 mg/dL — AB (ref 65–99)
Glucose-Capillary: 123 mg/dL — ABNORMAL HIGH (ref 65–99)

## 2017-07-12 LAB — POCT ACTIVATED CLOTTING TIME
ACTIVATED CLOTTING TIME: 516 s
Activated Clotting Time: 268 s

## 2017-07-12 SURGERY — LEFT HEART CATH AND CORONARY ANGIOGRAPHY
Anesthesia: LOCAL

## 2017-07-12 MED ORDER — CARVEDILOL 6.25 MG PO TABS: 6.2500 mg | ORAL_TABLET | Freq: Two times a day (BID) | ORAL | 0 refills | Status: DC

## 2017-07-12 MED ORDER — POTASSIUM CHLORIDE CRYS ER 20 MEQ PO TBCR
20.0000 meq | EXTENDED_RELEASE_TABLET | Freq: Once | ORAL | Status: AC
  Administered 2017-07-12: 20 meq via ORAL
  Filled 2017-07-12: qty 1

## 2017-07-12 MED ORDER — CLOPIDOGREL BISULFATE 75 MG PO TABS: 75.0000 mg | ORAL_TABLET | Freq: Every day | ORAL | 0 refills | Status: DC

## 2017-07-12 MED ORDER — ATORVASTATIN CALCIUM 80 MG PO TABS: 80.0000 mg | ORAL_TABLET | Freq: Every day | ORAL | 0 refills | Status: DC

## 2017-07-12 MED ORDER — PANTOPRAZOLE SODIUM 40 MG PO TBEC: 40.0000 mg | DELAYED_RELEASE_TABLET | Freq: Every day | ORAL | 0 refills | Status: DC
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
## 2017-07-12 NOTE — Care Management Note (Addendum)
Case Management Note  Patient Details  Name: Alice Bennett MRN: [id number] Date of Birth: 10/28/1961  Subjective/Objective:    Unstable Angina               Action/Plan: Patient lives with her Nephew; no PCP/ no medical insurance; patient is agreeable to [facility]; apt made for Sept 10, 2018 at 10 am; she can also go there to get her prescriptions filled; pharmacy of choice is [facility]; she has filed for [employer] and is getting Disability.  Expected Discharge Date:    07/12/2017              Expected Discharge Plan:  Home/Self Care  In-House Referral:   Financial Counselor   Discharge planning Services  CM Consult, [facility]    Status of Service:  In process, will continue to follow  Reola MosherChandler, Massiel Stipp L, RN,MHA,BSN [phone] 07/12/2017, 11:14 AM

## 2017-07-12 NOTE — Progress Notes (Signed)
Patient offered pneumonia vaccine prior to discharge.  Patient refused, stated she will get the vaccination from her doctor. Elnita MaxwellSalome N Tashayla Therien, RN

## 2017-07-12 NOTE — Progress Notes (Signed)
The patient has been seen in conjunction with Laverda Page, in [facility]. All aspects of care have been considered and discussed. The patient has been personally interviewed, examined, and all clinical data has been reviewed.   Non-ST elevation acute coronary syndrome possibly stress cardiomyopathy with superimposed CAD. LAD DES was placed.  Currently on appropriate therapy for systolic dysfunction including beta blocker, ACE inhibitor, and spironolactone.  Creatinine is elevated this a.m. Spironolactone will be discontinued.  Probably home today but will need to have blood work within the next 5-7 days to reassess kidney function and potassium.   Progress Note  Patient Name: Alice Bennett Date of Encounter: 07/12/2017  Primary Cardiologist: Tresa Endo  Subjective   Feeling well today. Ambulated in the hallway with cardiac rehab and felt well. No chest pain.   Inpatient Medications    Scheduled Meds: . aspirin EC  81 mg Oral Daily  . atorvastatin  80 mg Oral q1800  . carvedilol  6.25 mg Oral BID WC  . clonazePAM  1 mg Oral BID  . clopidogrel  75 mg Oral Daily  . heparin  5,000 Units Subcutaneous Q8H  . insulin aspart  0-15 Units Subcutaneous TID WC  . insulin aspart  0-5 Units Subcutaneous QHS  . lisinopril  5 mg Oral Daily  . mouth rinse  15 mL Mouth Rinse BID  . pantoprazole  40 mg Oral Daily  . pregabalin  75 mg Oral BID  . sertraline  100 mg Oral Daily  . sodium chloride flush  3 mL Intravenous Q12H  . spironolactone  12.5 mg Oral Daily   Continuous Infusions: . sodium chloride Stopped (07/10/17 1935)  . nitroGLYCERIN Stopped (07/10/17 1536)   PRN Meds: sodium chloride, acetaminophen, ALPRAZolam, morphine injection, nitroGLYCERIN, ondansetron (ZOFRAN) IV, pneumococcal 23 valent vaccine, sodium chloride flush, zolpidem   Vital Signs    Vitals:   07/11/17 1611 07/11/17 2015 07/12/17 0501 07/12/17 0830  BP: 108/65 (!) 110/51 (!) 92/43 117/76  Pulse:  66 72 76  Resp:  (!) 26 18 18    Temp: 99.3 F (37.4 C) 98.4 F (36.9 C) 99.3 F (37.4 C)   TempSrc: Oral Oral Oral   SpO2: 98% 98% 96%   Weight:   230 lb 4.8 oz (104.5 kg)   Height:        Intake/Output Summary (Last 24 hours) at 07/12/17 1042 Last data filed at 07/12/17 0900  Gross per 24 hour  Intake             1680 ml  Output              700 ml  Net              980 ml   Filed Weights   07/10/17 1028 07/11/17 1110 07/12/17 0501  Weight: 236 lb 1.6 oz (107.1 kg) 230 lb 2.6 oz (104.4 kg) 230 lb 4.8 oz (104.5 kg)    Telemetry    SR - Personally Reviewed  ECG    9/3 SB with slightly improved TWI - Personally Reviewed  Physical Exam   General: Well developed, well nourished, female appearing in no acute distress. Head: Normocephalic, atraumatic.  Neck: Supple without bruits, JVD. Lungs:  Resp regular and unlabored, CTA. Heart: RRR, S1, S2, no S3, S4, or murmur; no rub. Abdomen: Soft, non-tender, non-distended with normoactive bowel sounds. No hepatomegaly. No rebound/guarding. No obvious abdominal masses. Extremities: No clubbing, cyanosis, edema. Distal pedal pulses are 2+ bilaterally. Neuro: Alert and oriented X  3. Moves all extremities spontaneously. Psych: Normal affect.  Labs    Chemistry Recent Labs Lab 07/09/17 1643 07/11/17 0223 07/12/17 0648  NA 138 135 134*  K 3.6 4.0 3.4*  CL 106 96* 97*  CO2 24 27 27   GLUCOSE 109* 143* 235*  BUN 15 11 24*  CREATININE 0.92 0.94 1.24*  CALCIUM 9.1 9.4 8.8*  PROT 7.1  --   --   ALBUMIN 3.4*  --   --   AST 20  --   --   ALT 14  --   --   ALKPHOS 53  --   --   BILITOT 0.3  --   --   GFRNONAA >60 >60 48*  GFRAA >60 >60 56*  ANIONGAP 8 12 10      Hematology Recent Labs Lab 07/09/17 1643 07/10/17 0326 07/11/17 0223  WBC 11.3* 7.9 12.1*  RBC 4.10 4.17 4.92  HGB 11.7* 11.7* 14.0  HCT 35.4* 36.1 42.9  MCV 86.3 86.6 87.2  MCH 28.5 28.1 28.5  MCHC 33.1 32.4 32.6  RDW 14.7 14.8 14.7  PLT 180 146* 220    Cardiac  Enzymes Recent Labs Lab 07/09/17 2035 07/10/17 0326 07/10/17 1007 07/10/17 2215  TROPONINI 0.29* 0.15* 0.18* 0.48*   No results for input(s): TROPIPOC in the last 168 hours.   BNPNo results for input(s): BNP, PROBNP in the last 168 hours.   DDimer No results for input(s): DDIMER in the last 168 hours.    Radiology    No results found.  Cardiac Studies   LHC: 07/10/17  Conclusion     Prox RCA lesion, 35 %stenosed.  Prox LAD lesion, 0 %stenosed.  Prox Cx to Mid Cx lesion, 50 %stenosed.  A STENT SIERRA 3.00 X 15 MM drug eluting stent was successfully placed, and does not overlap previously placed stent.  Mid LAD lesion, 70 %stenosed.  Post intervention, there is a 0% residual stenosis.  There is moderate left ventricular systolic dysfunction.  The left ventricular ejection fraction is 35-45% by visual estimate.  LV end diastolic pressure is severely elevated.   1. Single vessel obstructive CAD with 70% mid LAD. FFR 0.79. Stent in proximal LAD is patent 2. Severe LV dysfunction. Wall motion abnormality is more consistent with Takotsubo pattern 3. Markedly elevated LVEDP 4. Successful stenting of the mid LAD with DES.  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 The mid LAD stenosis was not critical and is unlikely to have caused these findings. It was borderline obstructive with abnormal FFR so we did proceed with stenting of this lesion. Will continue DAPT for one year. IV diuresis. Optimize BP control with beta blocker and ACEi.   TTE: 07/10/17  Study Conclusions  - Left ventricle: The cavity size was normal. Wall thickness was   normal. Systolic function was moderately reduced. The estimated   ejection fraction was in the range of 35% to 40%. Hypokinesis of   the mid-apicalanteroseptal, anterior, inferolateral, inferior,   and inferoseptal myocardium. Features are consistent with a   pseudonormal  left ventricular filling pattern, with concomitant   abnormal relaxation and increased filling pressure (grade 2   diastolic dysfunction). - Left atrium: The atrium was mildly dilated.  Impressions:  - Appearance is most suggestive of apical ballooning due to   takotsubo syndrome, but cannot exclude ischemic heart disease   with multivessel involvement or mid-vessel stenosis in a large   &quot;wrap-around&quot; LAD artery.  Patient Profile  56 y.o. female with a history of CAD, hypertension, diabetes mellitus and hyperlipidemia who was transferred from [facility] following admission for chest pain with plans for cardiac catheterization.  Assessment & Plan    1. Unstable angina/NSTEMI: underwent post bare-metal stenting of her mid LAD with a 3.015 mm MultiLink vision stent in June 2014.  She had experienced 2-3 months of progressive exertional chest pain and dyspnea.  Echo at [facility]  on 07/09/2017 showed an EF of 25% On 07/08/17 after walking to the bathroom, she developed similar chest pain as the previous night and now her ECG shows marked new T-wave inversion anterolaterally V1-6  with T-wave inversion also in leads 1 and L, 2 and aVF.  Cath 07/09/17 showed findings c/w Takotsubo cardiomyopathy but mid LAD lesion with FFR 0.79 for which she underwent PCI. Had recurrent pain post procedure; now pain free. Trop peaked at 0.48. -- Plan DAPT with ASA/plavix for at least a year, with statin, BB and ACEi  2. 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  Apical ballooning at cath c/w Takotsubo cardiomyopathy. However, the patient had noticed several months of progressive exertional angina and dyspnea leading to her hospitalization probably contributed by LAD stenosis. -- on carvedilol 6.25 mg bid, lisinopril and spironolactone 12.5 mg daily.   3. Mixed hyperlipidemia:  on atorvastatin 80 mg --LDL 232, trig  420 at OSH prior to admission  4. Essential hypertension: BP stable today.  5. Type 2 diabetes mellitus: Metformin on hold. -- Hgb A1c 6.5  6.  Obesity: BMI 35.16.  Weight loss is imperative  7. Hypokalemia: Replace x1   Signed, Laverda PageLindsay Roberts, NP  07/12/2017, 10:42 AM  Pager # [phone]

## 2017-07-12 NOTE — Progress Notes (Signed)
CARDIAC REHAB PHASE I   PRE:  Rate/Rhythm: 74 SR  BP:  Supine:   Sitting: 130/75  Standing:    SaO2: 98%RA  MODE:  Ambulation: 470 ft   POST:  Rate/Rhythm: 87 SR  BP:  Supine:   Sitting: 114/64  Standing:    SaO2: 98%RA 0845-0945 Pt walked 470 ft with steady gait. No CP or dizziness. Tolerated well. MI and CHF ed completed with pt . Discussed MI restrictions, NTG use, risk factors, ex ed and diet. Gave pt smoking cessation handout and fake cigarette. Pt has quit cold [place] with each pregnancy. Encouraged her to quit now. Gave pt an article on takotsubo cardiomyopathy and explained risk of CHF with low EF. Gave CHF booklet and low sodium diet. Pt does not have scales at home. She is trying to get her Medicaid. She needs assistance with her meds. Needs to see case manager. Discussed CRP 2 and will refer to [facility]. Pt unsure if she can attend as she will need transportation and her Medicaid.   Luetta Nuttingharlene Karlee Staff, RN BSN  07/12/2017 9:41 AM

## 2017-07-18 ENCOUNTER — Inpatient Hospital Stay: Payer: Self-pay

## 2017-07-19 ENCOUNTER — Ambulatory Visit: Payer: Self-pay | Admitting: Physician Assistant

## 2017-07-19 ENCOUNTER — Telehealth: Payer: Self-pay | Admitting: Physician Assistant

## 2017-07-19 MED ORDER — CARVEDILOL 6.25 MG PO TABS: 6.2500 mg | ORAL_TABLET | Freq: Two times a day (BID) | ORAL | 0 refills | Status: DC

## 2017-07-19 MED ORDER — CLOPIDOGREL BISULFATE 75 MG PO TABS: 75.0000 mg | ORAL_TABLET | Freq: Every day | ORAL | 0 refills | Status: DC

## 2017-07-19 MED ORDER — PANTOPRAZOLE SODIUM 40 MG PO TBEC: 40.0000 mg | DELAYED_RELEASE_TABLET | Freq: Every day | ORAL | 0 refills | Status: AC

## 2017-07-19 MED ORDER — ATORVASTATIN CALCIUM 80 MG PO TABS: 80.0000 mg | ORAL_TABLET | Freq: Every day | ORAL | 0 refills | Status: DC

## 2017-07-19 NOTE — Telephone Encounter (Signed)
Rx sent to correct pharmacy.   Confirmed upcoming appt with patient.   Patient verbalized understanding.

## 2017-07-19 NOTE — Telephone Encounter (Signed)
New message      Pt need these meds sent to [facility] in [place], [place] [facility] of [facility]atorvastatin, carvedilol, clopidogrel and pantoprazole

## 2017-07-27 ENCOUNTER — Encounter: Payer: Self-pay | Admitting: Physician Assistant

## 2017-07-27 ENCOUNTER — Ambulatory Visit (INDEPENDENT_AMBULATORY_CARE_PROVIDER_SITE_OTHER): Payer: Medicaid Other | Admitting: Physician Assistant

## 2017-07-27 VITALS — BP 110/70 | HR 58 | Ht 69.0 in | Wt 233.0 lb

## 2017-07-27 DIAGNOSIS — I5181 Takotsubo syndrome: Secondary | ICD-10-CM

## 2017-07-27 DIAGNOSIS — I10 Essential (primary) hypertension: Secondary | ICD-10-CM

## 2017-07-27 DIAGNOSIS — E785 Hyperlipidemia, unspecified: Secondary | ICD-10-CM | POA: Diagnosis not present

## 2017-07-27 DIAGNOSIS — I2510 Atherosclerotic heart disease of native coronary artery without angina pectoris: Secondary | ICD-10-CM

## 2017-07-27 DIAGNOSIS — Z79899 Other long term (current) drug therapy: Secondary | ICD-10-CM | POA: Diagnosis not present

## 2017-07-27 DIAGNOSIS — E119 Type 2 diabetes mellitus without complications: Secondary | ICD-10-CM | POA: Diagnosis not present

## 2017-07-27 MED ORDER — NITROGLYCERIN 0.4 MG/SPRAY TL SOLN: 1.0000 | 2 refills | Status: DC | PRN

## 2017-07-27 MED ORDER — SPIRONOLACTONE 25 MG PO TABS: 12.5000 mg | ORAL_TABLET | Freq: Every day | ORAL | 3 refills | Status: DC

## 2017-07-27 MED ORDER — LISINOPRIL 2.5 MG PO TABS: 2.5000 mg | ORAL_TABLET | Freq: Every day | ORAL | 3 refills | Status: DC

## 2017-07-27 NOTE — Patient Instructions (Addendum)
Medication Instructions: DECREASE the Lisinopril to 2.5 mg tablet daily START Spironolactone 12.5 mg (half of a  tablet) daily. Nitroglycerin spray has been filled for you.  If you need a refill on your cardiac medications before your next appointment, please call your pharmacy.   Labwork: Your physician recommends that you have a BMET done today.  Your physician recommends that you return for lab work in: one week for a BMET. You do not have to be fasting. This can be done at the [facility] office. You do not need an appointment.  Your physician recommends that you return for lab work in: 6-8 weeks for a FASTING lipid and Hepatic. You may have this done at the [facility] office.   Follow-Up: Your physician wants you to follow-up in: one month with Azalee Course, PA and in 3 months with Dr. Tresa Endo. You will receive a reminder letter in the mail two months in advance. If you don't receive a letter, please call our office to schedule this follow-up appointment.   Thank you for choosing Heart[facility] at [facility]!!

## 2017-07-27 NOTE — Progress Notes (Signed)
Cardiology Office Note    Date:  07/27/2017   ID:  PETRONA WYETH, DOB [birth year], MRN [id number]  PCP:  Retia Passe, NP  Cardiologist:  Dr. Tresa Endo  Chief Complaint  Patient presents with  . Follow-up    swelling and occassional chest pain     History of Present Illness:  Alice Bennett is a 56 y.o. female with PMH of CAD, HTN, HLD and DM II. He had a history of coronary artery disease and underwent stenting of mid LAD with bare metal stent in June 2014 at [facility]. Recently, she moved upper normal area and has not established with a cardiologist. She presented to the hospital on 07/09/2017 with chest pain after tripping in ditch. Echocardiogram obtained on 07/10/2017 however showed EF 35-40%, hypokinesis of the mid apical anteroseptal, anterior, inferolateral, inferior, inferoseptal myocardium, grade 2 DD, mild LAE. She eventually underwent cardiac catheterization on the same day which showed 30% proximal RCA lesion, patent proximal LAD stent, 70% mid LAD lesion with FFR 0.79 treated with DES (does not overlap previous prox LAD stent), 50% proximal to mid left circumflex lesion, EF 35-45%. Her mid LAD lesion was not critical and It was felt patient likely has Takotsubo syndrome. There was an initial attempt at adding spironolactone, however her creatinine increased mildly, spironolactone was held prior to discharge. Metoprolol was switched to carvedilol due to LV dysfunction.  She presents today for cardiology office visit. She says sometimes she has ankle swelling in the legs, her HCTZ was recently discontinued. She also had hypokalemia in the hospital as well. I want rechallenge her with spironolactone. Even though spironolactone was discontinued prior to discharge due to mildly elevated creatinine, however I think the elevation of the creatinine likely has to do with contrast dyes instead of medication. She'll occasionally have a little arm pain only last a few seconds at a time.  She will continue on aspirin and Plavix. She denies any significant orthopnea or PND.   Past Medical History:  Diagnosis Date  . CAD (coronary artery disease)    07/10/17 PCI/DES to LAD, EF 35-40%  . Diabetes mellitus without complication (HCC)   . Hyperlipidemia   . Hypertension   . NSTEMI (non-ST elevated myocardial infarction) [facility])     Past Surgical History:  Procedure Laterality Date  . CORONARY STENT INTERVENTION N/A 07/10/2017   Procedure: CORONARY STENT INTERVENTION;  Surgeon: Swaziland, Peter M, MD;  Location[facility] INVASIVE CV LAB;  Service: Cardiovascular;  Laterality: N/A;  . INTRAVASCULAR PRESSURE WIRE/FFR STUDY N/A 07/10/2017   Procedure: INTRAVASCULAR PRESSURE WIRE/FFR STUDY;  Surgeon: Swaziland, Peter M, MD;  Location: [facility] INVASIVE CV LAB;  Service: Cardiovascular;  Laterality: N/A;  . LEFT HEART CATH AND CORONARY ANGIOGRAPHY N/A 07/10/2017   Procedure: LEFT HEART CATH AND CORONARY ANGIOGRAPHY;  Surgeon: Swaziland, Peter M, MD;  Location: [facility] INVASIVE CV LAB;  Service: Cardiovascular;  Laterality: N/A;    Current Medications: Outpatient Medications Prior to Visit  Medication Sig Dispense Refill  . aspirin EC 81 MG tablet Take 81 mg by mouth daily.    Marland Kitchen atorvastatin (LIPITOR) 80 MG tablet Take 1 tablet (80 mg total) by mouth daily at 6 PM. 30 tablet 0  . carvedilol (COREG) 6.25 MG tablet Take 1 tablet (6.25 mg total) by mouth 2 (two) times daily with a meal. 60 tablet 0  . clonazePAM (KLONOPIN) 1 MG tablet Take 1 mg by mouth 2 (two) times daily.    . clopidogrel (PLAVIX) 75 MG  tablet Take 1 tablet (75 mg total) by mouth daily. 30 tablet 0  . metFORMIN (GLUCOPHAGE) 1000 MG tablet Take 1,000 mg by mouth 2 (two) times daily with a meal.    . pantoprazole (PROTONIX) 40 MG tablet Take 1 tablet (40 mg total) by mouth daily. 30 tablet 0  . sertraline (ZOLOFT) 100 MG tablet Take 100 mg by mouth daily.    Marland Kitchen lisinopril (PRINIVIL,ZESTRIL) 5 MG tablet Take 5 mg by mouth daily.    . nitroGLYCERIN  (NITROSTAT) 0.4 MG SL tablet Place 0.4 mg under the tongue every 5 (five) minutes as needed for chest pain.     No facility-administered medications prior to visit.      Allergies:   Codeine   Social History   Social History  . Marital status: Married    Spouse name: N/A  . Number of children: N/A  . Years of education: N/A   Social History Main Topics  . Smoking status: Current Every Day Smoker    Packs/day: 0.50    Years: 10.00    Types: Cigarettes  . Smokeless tobacco: Never Used  . Alcohol use No  . Drug use: No  . Sexual activity: Not Asked   Other Topics Concern  . None   Social History Narrative  . None     Family History:  The patient's family history is not on file.   ROS:   Please see the history of present illness.    ROS All other systems reviewed and are negative.   PHYSICAL EXAM:   VS:  BP 110/70   Pulse (!) 58   Ht  (1.753 m)   Wt 233 lb (105.7 kg)   BMI 34.41 kg/m    GEN: Well nourished, well developed, in no acute distress  HEENT: normal  Neck: no JVD, carotid bruits, or masses Cardiac: RRR; no murmurs, rubs, or gallops,no edema  Respiratory:  clear to auscultation bilaterally, normal work of breathing GI: soft, nontender, nondistended, + BS MS: no deformity or atrophy  Skin: warm and dry, no rash Neuro:  Alert and Oriented x 3, Strength and sensation are intact Psych: euthymic mood, full affect  Wt Readings from Last 3 Encounters:  07/27/17 233 lb (105.7 kg)  07/12/17 230 lb 4.8 oz (104.5 kg)  12/08/16 220 lb (99.8 kg)      Studies/Labs Reviewed:   EKG:  EKG is ordered today.  The ekg ordered today demonstrates Normal sinus rhythm,T-wave inversion in anterolateral leads.  Recent Labs: 07/09/2017: ALT 14 07/11/2017: Hemoglobin 14.0; Platelets 220 07/12/2017: BUN 24; Creatinine, Ser 1.24; Potassium 3.4; Sodium 134   Lipid Panel No results found for: CHOL, TRIG, HDL, CHOLHDL, VLDL, LDLCALC, LDLDIRECT  Additional studies/  records that were reviewed today include:   Echo 07/10/2017 LV EF: 35% -   40%  Study Conclusions  - Left ventricle: The cavity size was normal. Wall thickness was   normal. Systolic function was moderately reduced. The estimated   ejection fraction was in the range of 35% to 40%. Hypokinesis of   the mid-apicalanteroseptal, anterior, inferolateral, inferior,   and inferoseptal myocardium. Features are consistent with a   pseudonormal left ventricular filling pattern, with concomitant   abnormal relaxation and increased filling pressure (grade 2   diastolic dysfunction). - Left atrium: The atrium was mildly dilated.  Impressions:  - Appearance is most suggestive of apical ballooning due to   takotsubo syndrome, but cannot exclude ischemic heart disease   with multivessel involvement  or mid-vessel stenosis in a large   &quot;wrap-around&quot; LAD artery.    Cath 07/10/2017 Conclusion     Prox RCA lesion, 35 %stenosed.  Prox LAD lesion, 0 %stenosed.  Prox Cx to Mid Cx lesion, 50 %stenosed.  A STENT SIERRA 3.00 X 15 MM drug eluting stent was successfully placed, and does not overlap previously placed stent.  Mid LAD lesion, 70 %stenosed.  Post intervention, there is a 0% residual stenosis.  There is moderate left ventricular systolic dysfunction.  The left ventricular ejection fraction is 35-45% by visual estimate.  LV end diastolic pressure is severely elevated.   1. Single vessel obstructive CAD with 70% mid LAD. FFR 0.79. Stent in proximal LAD is patent 2. Severe LV dysfunction. Wall motion abnormality is more consistent with Takotsubo pattern 3. Markedly elevated LVEDP 4. Successful stenting of the mid LAD with DES.  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 The mid LAD stenosis was not critical and is unlikely to have caused these findings. It was borderline obstructive with abnormal FFR so we  did proceed with stenting of this lesion. Will continue DAPT for one year. IV diuresis. Optimize BP control with beta blocker and ACEi.      ASSESSMENT:    1. Coronary artery disease involving native coronary artery of native heart without angina pectoris   2. Hyperlipidemia, unspecified hyperlipidemia type   3. Hypertension, unspecified type   4. Medication management   5. Takotsubo cardiomyopathy   6. Controlled type 2 diabetes mellitus without complication, without long-term current use of insulin (HCC)      PLAN:  In order of problems listed above:  1. CAD: Only transient chest discomfort and arm pain lasting a few seconds at a time, no further workup is needed at this time. Recently underwent DES to mid LAD, the new stent does not overlap with the previous proximal LAD stent. Continue on aspirin and Plavix.  2. Takotsubo cardiomyopathy: Continue carvedilol, blood pressure soft, I would decrease lisinopril to 2.5 mg daily. Add spironolactone for LV dysfunction and a lower extremity edema. Note, spironolactone was discontinued prior to recent discharge as her renal function worsened, I think this is likely related to contrast dye instead of the medication.   3. Hypertension: Blood pressure stable, reduce lisinopril to 2.5 mg daily which will allow addition of spironolactone 12.5 mg daily. Basic metabolic panel today and also in one week  4. Hyperlipidemia: Continue high-dose statin, fasting lipid panel and LFTs in 6-8 weeks.  5. DM 2: We'll defer to primary care provider    Medication Adjustments/Labs and Tests Ordered: Current medicines are reviewed at length with the patient today.  Concerns regarding medicines are outlined above.  Medication changes, Labs and Tests ordered today are listed in the Patient Instructions below. Patient Instructions  Medication Instructions: DECREASE the Lisinopril to 2.5 mg tablet daily START Spironolactone 12.5 mg (half of a  tablet)  daily. Nitroglycerin spray has been filled for you.  If you need a refill on your cardiac medications before your next appointment, please call your pharmacy.   Labwork: Your physician recommends that you have a BMET done today.  Your physician recommends that you return for lab work in: one week for a BMET. You do not have to be fasting. This can be done at the [facility] office. You do not need an appointment.  Your physician recommends that you return for lab work in: 6-8 weeks for a FASTING lipid  and Hepatic. You may have this done at the [facility] [place] office.   Follow-Up: Your physician wants you to follow-up in: one month with Azalee Course, PA and in 3 months with Dr. Tresa Endo. You will receive a reminder letter in the mail two months in advance. If you don't receive a letter, please call our office to schedule this follow-up appointment.   Thank you for choosing [facility] at [facility], Azalee Course, [place]  07/27/2017 11:34 PM    [facility] [address] [place], [place], [place]  [address] Phone: [phone]; Fax: [phone]

## 2017-07-28 LAB — BASIC METABOLIC PANEL
BUN / CREAT RATIO: 8 — AB (ref 9–23)
BUN: 8 mg/dL (ref 6–24)
CALCIUM: 9.2 mg/dL (ref 8.7–10.2)
CO2: 25 mmol/L (ref 20–29)
CREATININE: 1.05 mg/dL — AB (ref 0.57–1.00)
Chloride: 107 mmol/L — ABNORMAL HIGH (ref 96–106)
GFR calc non Af Amer: 60 mL/min/{1.73_m2} (ref >59)
GFR, EST AFRICAN AMERICAN: 69 mL/min/{1.73_m2} (ref >59)
Glucose: 97 mg/dL (ref 65–99)
Potassium: 3.9 mmol/L (ref 3.5–5.2)
Sodium: 143 mmol/L (ref 134–144)

## 2017-08-01 ENCOUNTER — Telehealth: Payer: Self-pay | Admitting: Physician Assistant

## 2017-08-01 NOTE — Telephone Encounter (Signed)
Communicated to patient, see results note.

## 2017-08-01 NOTE — Telephone Encounter (Signed)
New message      Patient returning call  For lab results.

## 2017-08-31 ENCOUNTER — Ambulatory Visit: Payer: Medicaid Other | Admitting: Physician Assistant

## 2017-10-14 ENCOUNTER — Ambulatory Visit: Payer: Medicaid Other | Admitting: Cardiovascular Disease

## 2018-11-17 ENCOUNTER — Encounter: Payer: Self-pay | Admitting: Physician Assistant

## 2019-01-30 ENCOUNTER — Ambulatory Visit: Payer: Medicaid Other | Admitting: Cardiovascular Disease

## 2019-04-27 ENCOUNTER — Telehealth: Payer: Self-pay | Admitting: Cardiovascular Disease

## 2019-04-27 NOTE — Telephone Encounter (Signed)
LVM for patient to call and confirm appt for 04-30-19

## 2019-04-30 ENCOUNTER — Other Ambulatory Visit: Payer: Self-pay

## 2019-04-30 ENCOUNTER — Encounter: Payer: Self-pay | Admitting: Cardiovascular Disease

## 2019-04-30 ENCOUNTER — Ambulatory Visit (INDEPENDENT_AMBULATORY_CARE_PROVIDER_SITE_OTHER): Payer: Medicaid Other | Admitting: Cardiovascular Disease

## 2019-04-30 VITALS — BP 144/78 | HR 60 | Ht 69.0 in | Wt 242.4 lb

## 2019-04-30 DIAGNOSIS — E118 Type 2 diabetes mellitus with unspecified complications: Secondary | ICD-10-CM | POA: Diagnosis not present

## 2019-04-30 DIAGNOSIS — I10 Essential (primary) hypertension: Secondary | ICD-10-CM | POA: Diagnosis not present

## 2019-04-30 DIAGNOSIS — E785 Hyperlipidemia, unspecified: Secondary | ICD-10-CM

## 2019-04-30 DIAGNOSIS — I2510 Atherosclerotic heart disease of native coronary artery without angina pectoris: Secondary | ICD-10-CM | POA: Diagnosis not present

## 2019-04-30 DIAGNOSIS — I5181 Takotsubo syndrome: Secondary | ICD-10-CM

## 2019-04-30 DIAGNOSIS — K219 Gastro-esophageal reflux disease without esophagitis: Secondary | ICD-10-CM

## 2019-04-30 MED ORDER — SPIRONOLACTONE 25 MG PO TABS: 12.5000 mg | ORAL_TABLET | Freq: Every day | ORAL | 3 refills | Status: DC

## 2019-04-30 MED ORDER — LISINOPRIL 5 MG PO TABS: 5.0000 mg | ORAL_TABLET | Freq: Every day | ORAL | 1 refills | Status: DC

## 2019-04-30 NOTE — Progress Notes (Signed)
Cardiology Office Note    Date:  04/30/2019   ID:  Alice Bennett, DOB 01/22/1961, MRN [id number]  PCP:  Barnie Mort, NP  Cardiologist:  Shelva Majestic, MD   Chief Complaint  Patient presents with  . Follow-up   Initial office evaluation with me  History of Present Illness:  Alice Bennett is a 58 y.o. female who I had seen when she was hospitalized in September 2018 but have never seen her in the office.  She has a history of known CAD and in 2014 underwent bare-metal stenting of her mid LAD with a 3.0 x 15 mm MultiLink vision stent at [facility].  She presented to the hospital on July 09, 2017 with chest pain after tripping in a ditch.  An echocardiogram at [facility] showed an EF of 35 to 40% with hypokinesis of the mid apical, anteroseptal anterior inferolateral inferior inferoseptal myocardium with grade 2 diastolic dysfunction.  She was transferred to [facility] and underwent cardiac catheterization which showed 30% proximal RCA lesion, patent proximal LAD stent, 70% mid LAD lesion with FFR of 0.79 treated with a DES stent and there was 50% proximal to mid circumflex stenosis.  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  There was an initial attempt of adding spironolactone but this was discontinued due to increasing creatinine.  She apparently was seen on July 27, 2017 in the office by Mammie Russian, PA for follow-up evaluation she was on aspirin and Plavix had not developed any recurrent symptomatology.  She was also on lisinopril and due to low blood pressure this was reduced to 2.5 mg daily.  She also has a history of diabetes mellitus, hyperlipidemia.  She has not been seen in our by our group in over 2 years.  She apparently recently saw Katheran Awe, NP who advised cardiology follow-up evaluation.  Patient denies any exertional chest pain.  She does admit to rare episodes of sharp twinges of chest discomfort.  She is on atorvastatin 80 mg  for hyperlipidemia.  She continues to be on dual antiplatelet therapy with aspirin and Plavix.  She continues to be on spironolactone 12.5 mg daily and lisinopril 2.5 mg in addition to carvedilol 6.25 mg twice a day with her LV dysfunction.  She is diabetic on Farxiga and metformin, pantoprazole for GERD and continues to be on Lyrica for peripheral neuropathy.  She presents for evaluation   Past Medical History:  Diagnosis Date  . CAD (coronary artery disease)    07/10/17 PCI/DES to LAD, EF 35-40%  . Diabetes mellitus without complication ([place])   . Hyperlipidemia   . Hypertension   . NSTEMI (non-ST elevated myocardial infarction) [facility])     Past Surgical History:  Procedure Laterality Date  . CORONARY STENT INTERVENTION N/A 07/10/2017   Procedure: CORONARY STENT INTERVENTION;  Surgeon: Martinique, Peter M, MD;  Location: [place] [facility];  Service: Cardiovascular;  Laterality: N/A;  . INTRAVASCULAR PRESSURE WIRE/FFR STUDY N/A 07/10/2017   Procedure: INTRAVASCULAR PRESSURE WIRE/FFR STUDY;  Surgeon: Martinique, Peter M, MD;  Location: [place] [facility];  Service: Cardiovascular;  Laterality: N/A;  . LEFT HEART CATH AND CORONARY ANGIOGRAPHY N/A 07/10/2017   Procedure: LEFT HEART CATH AND CORONARY ANGIOGRAPHY;  Surgeon: Martinique, Peter M, MD;  Location: [place] [facility];  Service: Cardiovascular;  Laterality: N/A;    Current Medications: Outpatient Medications Prior to Visit  Medication Sig Dispense Refill  . aspirin EC 81 MG tablet Take  81 mg by mouth daily.    Marland Kitchen atorvastatin (LIPITOR) 80 MG tablet Take 1 tablet (80 mg total) by mouth daily at 6 PM. 30 tablet 0  . carvedilol (COREG) 6.25 MG tablet Take 1 tablet (6.25 mg total) by mouth 2 (two) times daily with a meal. 60 tablet 0  . clonazePAM (KLONOPIN) 1 MG tablet Take 1 mg by mouth 2 (two) times daily.    . clopidogrel (PLAVIX) 75 MG tablet Take 1 tablet (75 mg total) by mouth daily. 30 tablet 0  . FARXIGA 10 MG TABS tablet TAKE 1 TABLET BY MOUTH  ONCE DAILY FOR 30 DAYS    . Folic Acid-Cholecalciferol 11-4998 MG-UNIT TABS Take by mouth.    Marland Kitchen lisinopril (PRINIVIL,ZESTRIL) 2.5 MG tablet Take 1 tablet (2.5 mg total) by mouth daily. 90 tablet 3  . magnesium oxide (MAG-OX) 400 MG tablet TAKE 1 TABLET BY MOUTH ONCE DAILY FOR 30 DAYS    . metFORMIN (GLUCOPHAGE) 500 MG tablet TAKE 2 TABLETS BY MOUTH TWICE DAILY FOR 30 DAYS    . Multiple Vitamin (MULTI-VITAMIN) tablet Take by mouth.    . nitroGLYCERIN (NITROLINGUAL) 0.4 MG/SPRAY spray Place 1 spray under the tongue every 5 (five) minutes x 3 doses as needed for chest pain. 12 g 2  . pantoprazole (PROTONIX) 40 MG tablet Take 1 tablet (40 mg total) by mouth daily. 30 tablet 0  . pregabalin (LYRICA) 100 MG capsule Take 100 mg by mouth 2 (two) times daily.    . sertraline (ZOLOFT) 100 MG tablet Take 100 mg by mouth daily.    . metFORMIN (GLUCOPHAGE) 1000 MG tablet Take 1,000 mg by mouth 2 (two) times daily with a meal.    . spironolactone (ALDACTONE) 25 MG tablet Take 0.5 tablets (12.5 mg total) by mouth daily. 45 tablet 3   No facility-administered medications prior to visit.      Allergies:   Codeine   Social History   Socioeconomic History  . Marital status: Married    Spouse name: Not on file  . Number of children: Not on file  . Years of education: Not on file  . Highest education level: Not on file  Occupational History  . Not on file  Social Needs  . Financial resource strain: Not on file  . Food insecurity    Worry: Not on file    Inability: Not on file  . Transportation needs    Medical: Not on file    Non-medical: Not on file  Tobacco Use  . Smoking status: Current Every Day Smoker    Packs/day: 0.50    Years: 10.00    Pack years: 5.00    Types: Cigarettes  . Smokeless tobacco: Never Used  Substance and Sexual Activity  . Alcohol use: No  . Drug use: No  . Sexual activity: Not on file  Lifestyle  . Physical activity    Days per week: Not on file    Minutes per  session: Not on file  . Stress: Not on file  Relationships  . Social Herbalist on phone: Not on file    Gets together: Not on file    Attends religious service: Not on file    Active member of club or organization: Not on file    Attends meetings of clubs or organizations: Not on file    Relationship status: Not on file  Other Topics Concern  . Not on file  Social History Narrative  . Not  on file     Family History:  The patient's family history is not on file.   ROS General: Negative; No fevers, chills, or night sweats;  HEENT: Negative; No changes in vision or hearing, sinus congestion, difficulty swallowing Pulmonary: Negative; No cough, wheezing, shortness of breath, hemoptysis Cardiovascular: Negative; No chest pain, presyncope, syncope, palpitations GI: GERD GU: Negative; No dysuria, hematuria, or difficulty voiding Musculoskeletal: Negative; no myalgias, joint pain, or weakness Hematologic/Oncology: Negative; no easy bruising, bleeding Endocrine: Positive for diabetes Neuro: Positive for peripheral neuropathy Skin: Negative; No rashes or skin lesions Psychiatric: Negative; No behavioral problems, depression Sleep: Negative; No snoring, daytime sleepiness, hypersomnolence, bruxism, restless legs, hypnogognic hallucinations, no cataplexy Other comprehensive 14 point system review is negative.   PHYSICAL EXAM:   VS:  BP (!) 144/78   Pulse 60   Ht '5\' 9"'  (1.753 m)   Wt 242 lb 6.4 oz (110 kg)   BMI 35.80 kg/m     Wt Readings from Last 3 Encounters:  04/30/19 242 lb 6.4 oz (110 kg)  07/27/17 233 lb (105.7 kg)  07/12/17 230 lb 4.8 oz (104.5 kg)    General: Alert, oriented, no distress.  Skin: normal turgor, no rashes, warm and dry HEENT: Normocephalic, atraumatic. Pupils equal round and reactive to light; sclera anicteric; extraocular muscles intact;  Nose without nasal septal hypertrophy Mouth/Parynx benign;  Neck: No JVD, no carotid bruits; normal  carotid upstroke Lungs: clear to ausculatation and percussion; no wheezing or rales Chest wall: without tenderness to palpitation Heart: PMI not displaced, RRR, s1 s2 normal, 1/6 systolic murmur, no diastolic murmur, no rubs, gallops, thrills, or heaves Abdomen: soft, nontender; no hepatosplenomehaly, BS+; abdominal aorta nontender and not dilated by palpation. Back: no CVA tenderness Pulses 2+ Musculoskeletal: full range of motion, normal strength, no joint deformities Extremities: no clubbing cyanosis or edema, Homan's sign negative  Neurologic: grossly nonfocal; Cranial nerves grossly wnl Psychologic: Normal mood and affect   Studies/Labs Reviewed:   EKG:  EKG is ordered today.  ECG (independently read by me): NSR at 60; resolution of prior anterior T wave inversion since 07/28/2017  Recent Labs: BMP Latest Ref Rng & Units 07/27/2017 07/12/2017 07/11/2017  Glucose 65 - 99 mg/dL 97 235(H) 143(H)  BUN 6 - 24 mg/dL 8 24(H) 11  Creatinine 0.57 - 1.00 mg/dL 1.05(H) 1.24(H) 0.94  BUN/Creat Ratio 9 - 23 8(L) - -  Sodium 134 - 144 mmol/L 143 134(L) 135  Potassium 3.5 - 5.2 mmol/L 3.9 3.4(L) 4.0  Chloride 96 - 106 mmol/L 107(H) 97(L) 96(L)  CO2 20 - 29 mmol/L '25 27 27  ' Calcium 8.7 - 10.2 mg/dL 9.2 8.8(L) 9.4     Hepatic Function Latest Ref Rng & Units 07/09/2017  Total Protein 6.5 - 8.1 g/dL 7.1  Albumin 3.5 - 5.0 g/dL 3.4(L)  AST 15 - 41 U/L 20  ALT 14 - 54 U/L 14  Alk Phosphatase 38 - 126 U/L 53  Total Bilirubin 0.3 - 1.2 mg/dL 0.3    CBC Latest Ref Rng & Units 07/11/2017 07/10/2017 07/09/2017  WBC 4.0 - 10.5 K/uL 12.1(H) 7.9 11.3(H)  Hemoglobin 12.0 - 15.0 g/dL 14.0 11.7(L) 11.7(L)  Hematocrit 36.0 - 46.0 % 42.9 36.1 35.4(L)  Platelets 150 - 400 K/uL 220 146(L) 180   Lab Results  Component Value Date   MCV 87.2 07/11/2017   MCV 86.6 07/10/2017   MCV 86.3 07/09/2017   No results found for: TSH No results found for: HGBA1C   BNP No  results found for: BNP  ProBNP No results  found for: PROBNP   Lipid Panel  No results found for: CHOL, TRIG, HDL, CHOLHDL, VLDL, LDLCALC, LDLDIRECT   RADIOLOGY: No results found.   Additional studies/ records that were reviewed today include:  I reviewed her records from her hospitalization in September 2018 including catheterization report, echo Doppler data, and subsequent evaluation with Almyra Deforest, PA-C   Prox RCA lesion, 35 %stenosed.  Prox LAD lesion, 0 %stenosed.  Prox Cx to Mid Cx lesion, 50 %stenosed.  A STENT SIERRA 3.00 X 15 MM drug eluting stent was successfully placed, and does not overlap previously placed stent.  Mid LAD lesion, 70 %stenosed.  Post intervention, there is a 0% residual stenosis.  There is moderate left ventricular systolic dysfunction.  The left ventricular ejection fraction is 35-45% by visual estimate.  LV end diastolic pressure is severely elevated.   1. Single vessel obstructive CAD with 70% mid LAD. FFR 0.79. Stent in proximal LAD is patent 2. Severe LV dysfunction. Wall motion abnormality is more consistent with Takotsubo pattern 3. Markedly elevated LVEDP 4. Successful stenting of the mid LAD with DES.  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 The mid LAD stenosis was not critical and is unlikely to have caused these findings. It was borderline obstructive with abnormal FFR so we did proceed with stenting of this lesion. Will continue DAPT for one year. IV diuresis. Optimize BP control with beta blocker and ACEi.      ASSESSMENT:    No diagnosis found.   PLAN:   1.  CAD: In 2014 she underwent bare-metal stenting of her LAD at [facility].  On July 09, 2017 she underwent stenting of her mid LAD a patent proximal LAD stent and 50% circumflex and 35% RCA stenoses.  She continues to be on DAPT.  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  ECG today shows complete resolution of her previous T wave abnormalities noted in September 2018.  She continues to be on lisinopril 2.5 mg, carvedilol 6.25 mg twice a day and spironolactone 12.5 mg daily  3.  Atypical chest pain: She admits to rare episodes of sharp fleeting chest pain, nonexertional  4.  Type 2 diabetes mellitus with complication: Currently on SGLT with Farxiga and metformin.  5.  Essential hypertension: Blood pressure is elevated today.  I have recommended further titration of lisinopril to 5 mg and she will continue present dose of carvedilol 6.25 mg twice a day and Spironolactone.  6.  Hyperlipidemia: LDL goal less than 70, she is on atorvastatin 80 mg.  Laboratory has been checked by her primary provider  7.  Peripheral neuropathy: on pregabalin  8.  GERD: Controlled with pantoprazole   Medication Adjustments/Labs and Tests Ordered: Current medicines are reviewed at length with the patient today.  Concerns regarding medicines are outlined above.  Medication changes, Labs and Tests ordered today are listed in the Patient Instructions below. There are no Patient Instructions on file for this visit.   Signed, Shelva Majestic, MD  04/30/2019 11:44 AM    [facility] [address], [place], [place], [place]  [address] Phone: [phone]

## 2019-04-30 NOTE — Patient Instructions (Addendum)
Medication Instructions:  Increase Lisinopril to 1 tablet daily (5 mg) May take extra 0.5 tablet of Spironlactone as needed if increased swelling occurs.  If you need a refill on your cardiac medications before your next appointment, please call your pharmacy.    Follow-Up: At [facility], you and your health needs are our priority.  As part of our continuing mission to provide you with exceptional heart care, we have created designated Provider Care Teams.  These Care Teams include your primary Cardiologist (physician) and Advanced Practice Providers (APPs -  Physician Assistants and Nurse Practitioners) who all work together to provide you with the care you need, when you need it. You will need a follow up appointment in 12 months.  Please call our office 2 months in advance to schedule this appointment.  You may see Dr.Kelly or one of the following Advanced Practice Providers on your designated Care Team: Almyra Deforest, [place] . Fabian Sharp, PA-C

## 2019-05-02 ENCOUNTER — Encounter: Payer: Self-pay | Admitting: Cardiovascular Disease

## 2019-09-20 ENCOUNTER — Telehealth: Payer: Self-pay

## 2019-09-20 NOTE — Telephone Encounter (Signed)
   [facility] Pre-operative Risk Assessment    Request for surgical clearance:  1. What type of surgery is being performed? DENTAL CLEANING, FILLINGS AND/OR EXTRACTIONS   2. When is this surgery scheduled? TBD   3. What type of clearance is required (medical clearance vs. Pharmacy clearance to hold med vs. Both)? BOTH  4. Are there any medications that need to be held prior to surgery and how long? PLAVIX AND ASA 5 DAYS PRIOR   5. Practice name and name of physician performing surgery? [facility]    6. What is your office phone number  [phone]   2.   What is your office fax number  [phone]  8.   Anesthesia type (None, local, MAC, general) ? LOCAL W/EPI

## 2019-09-20 NOTE — Telephone Encounter (Signed)
   Primary Cardiologist: Shelva Majestic, MD  Chart reviewed as part of pre-operative protocol coverage. Simple dental extractions are considered low risk procedures per guidelines and generally do not require any specific cardiac clearance. It is also generally accepted that for simple extractions and dental cleanings, there is no need to interrupt blood thinner therapy.   SBE prophylaxis is not required for the patient.  I will route this recommendation to the requesting party via Epic fax function and remove from pre-op pool.  Please call with questions.  Abigail Butts, PA-C 09/20/2019, 8:24 AM

## 2020-05-06 ENCOUNTER — Encounter: Payer: Self-pay | Admitting: Cardiovascular Disease

## 2020-05-06 ENCOUNTER — Ambulatory Visit (INDEPENDENT_AMBULATORY_CARE_PROVIDER_SITE_OTHER): Payer: Medicaid Other | Admitting: Cardiovascular Disease

## 2020-05-06 ENCOUNTER — Other Ambulatory Visit: Payer: Self-pay

## 2020-05-06 DIAGNOSIS — I10 Essential (primary) hypertension: Secondary | ICD-10-CM

## 2020-05-06 DIAGNOSIS — E785 Hyperlipidemia, unspecified: Secondary | ICD-10-CM

## 2020-05-06 DIAGNOSIS — I255 Ischemic cardiomyopathy: Secondary | ICD-10-CM

## 2020-05-06 DIAGNOSIS — I5181 Takotsubo syndrome: Secondary | ICD-10-CM

## 2020-05-06 DIAGNOSIS — I214 Non-ST elevation (NSTEMI) myocardial infarction: Secondary | ICD-10-CM | POA: Diagnosis not present

## 2020-05-06 DIAGNOSIS — E118 Type 2 diabetes mellitus with unspecified complications: Secondary | ICD-10-CM

## 2020-05-06 NOTE — Progress Notes (Signed)
Cardiology Office Note    Date:  05/07/2020   ID:  Alice Bennett, DOB Jul 17, 1961, MRN [id number]  PCP:  Alice Mort, NP  Cardiologist:  Alice Majestic, MD   No chief complaint on file.  One year  evaluation with me  History of Present Illness:  Alice Bennett is a 59 y.o. female who I had seen when she was hospitalized in September 2018 but have never seen her in the office.  She has a history of known CAD and in 2014 underwent bare-metal stenting of her mid LAD with a 3.0 x 15 mm MultiLink vision stent at [facility].  She presented to the hospital on July 09, 2017 with chest pain after tripping in a ditch.  An echocardiogram at [facility] showed an EF of 35 to 40% with hypokinesis of the mid apical, anteroseptal anterior inferolateral inferior inferoseptal myocardium with grade 2 diastolic dysfunction.  She was transferred to [facility] and underwent cardiac catheterization which showed 30% proximal RCA lesion, patent proximal LAD stent, 70% mid LAD lesion with FFR of 0.79 treated with a DES stent and there was 50% proximal to mid circumflex stenosis.  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  There was an initial attempt of adding spironolactone but this was discontinued due to increasing creatinine.  She apparently was seen on July 27, 2017 in the office by Alice Laud Me  ng, PA for follow-up evaluation she was on aspirin and Plavix had not developed any recurrent symptomatology.  She was also on lisinopril and due to low blood pressure this was reduced to 2.5 mg daily.  She also has a history of diabetes mellitus, hyperlipidemia.  She has not been seen in our by our group in over 2 years.  She apparently recently saw Alice Awe, NP who advised cardiology follow-up evaluation.  I last saw her in June 2020 at which time she denied exertional chest pain.  She  admitted to rare episodes of sharp twinges of chest discomfort.  She was on atorvastatin 80 mg  for hyperlipidemia.  She continued to be on dual antiplatelet therapy with aspirin and Plavix.  She was on spironolactone 12.5 mg daily and lisinopril 2.5 mg in addition to carvedilol 6.25 mg twice a day with her LV dysfunction.  She is diabetic on Farxiga and metformin, pantoprazole for GERD and continues to be on Lyrica for peripheral neuropathy.    Since I last saw her, over the past year she has continued to remain fairly stable.  She continues to experience rare nonexertional chest pain in the costochondral region which is nonexertional and short-lived.  She does experience mild shortness of breath with increased activity.  She has not had recent laboratory.  She is unaware of presyncope or syncope.  She presents for evaluation.   Past Medical History:  Diagnosis Date  . CAD (coronary artery disease)    07/10/17 PCI/DES to LAD, EF 35-40%  . Diabetes mellitus without complication ([place])   . Hyperlipidemia   . Hypertension   . NSTEMI (non-ST elevated myocardial infarction) [facility])     Past Surgical History:  Procedure Laterality Date  . CORONARY STENT INTERVENTION N/A 07/10/2017   Procedure: CORONARY STENT INTERVENTION;  Surgeon: Bennett, Alice M, MD;  Location: [place] CV LAB;  Service: Cardiovascular;  Laterality: N/A;  . INTRAVASCULAR PRESSURE WIRE/FFR STUDY N/A 07/10/2017   Procedure: INTRAVASCULAR PRESSURE WIRE/FFR STUDY;  Surgeon: Bennett, Alice M, MD;  Location: [place] CV  LAB;  Service: Cardiovascular;  Laterality: N/A;  . LEFT HEART CATH AND CORONARY ANGIOGRAPHY N/A 07/10/2017   Procedure: LEFT HEART CATH AND CORONARY ANGIOGRAPHY;  Surgeon: Bennett, Alice M, MD;  Location: [place] [facility];  Service: Cardiovascular;  Laterality: N/A;    Current Medications: Outpatient Medications Prior to Visit  Medication Sig Dispense Refill  . aspirin EC 81 MG tablet Take 81 mg by mouth daily.    Marland Kitchen atorvastatin (LIPITOR) 80 MG tablet Take 1 tablet (80 mg total) by mouth daily at 6 PM. 30 tablet  0  . carvedilol (COREG) 6.25 MG tablet Take 1 tablet (6.25 mg total) by mouth 2 (two) times daily with a meal. 60 tablet 0  . clonazePAM (KLONOPIN) 1 MG tablet Take 1 mg by mouth 2 (two) times daily.    . clopidogrel (PLAVIX) 75 MG tablet Take 1 tablet (75 mg total) by mouth daily. 30 tablet 0  . FARXIGA 10 MG TABS tablet TAKE 1 TABLET BY MOUTH ONCE DAILY FOR 30 DAYS    . Folic Acid-Cholecalciferol 11-4998 MG-UNIT TABS Take by mouth.    Marland Kitchen lisinopril (ZESTRIL) 5 MG tablet Take 1 tablet (5 mg total) by mouth daily. 90 tablet 1  . magnesium oxide (MAG-OX) 400 MG tablet TAKE 1 TABLET BY MOUTH ONCE DAILY FOR 30 DAYS    . metFORMIN (GLUCOPHAGE) 500 MG tablet TAKE 2 TABLETS BY MOUTH TWICE DAILY FOR 30 DAYS    . Multiple Vitamin (MULTI-VITAMIN) tablet Take by mouth.    . nitroGLYCERIN (NITROLINGUAL) 0.4 MG/SPRAY spray Place 1 spray under the tongue every 5 (five) minutes x 3 doses as needed for chest pain. 12 g 2  . pantoprazole (PROTONIX) 40 MG tablet Take 1 tablet (40 mg total) by mouth daily. 30 tablet 0  . pregabalin (LYRICA) 150 MG capsule Take 150 mg by mouth 2 (two) times daily.    . sertraline (ZOLOFT) 100 MG tablet Take 100 mg by mouth daily.    . pregabalin (LYRICA) 100 MG capsule Take 150 mg by mouth 2 (two) times daily.     Marland Kitchen spironolactone (ALDACTONE) 25 MG tablet Take 0.5 tablets (12.5 mg total) by mouth daily. May take extra 0.5 tablet as needed if swelling occurs. 45 tablet 3   No facility-administered medications prior to visit.     Allergies:   Codeine   Social History   Socioeconomic History  . Marital status: Married    Spouse name: Not on file  . Number of children: Not on file  . Years of education: Not on file  . Highest education level: Not on file  Occupational History  . Not on file  Tobacco Use  . Smoking status: Current Every Day Smoker    Packs/day: 0.50    Years: 10.00    Pack years: 5.00    Types: Cigarettes  . Smokeless tobacco: Never Used  Vaping Use  .  Vaping Use: Never used  Substance and Sexual Activity  . Alcohol use: No  . Drug use: No  . Sexual activity: Not on file  Other Topics Concern  . Not on file  Social History Narrative  . Not on file   Social Determinants of Health   Financial Resource Strain:   . Difficulty of Paying Living Expenses:   Food Insecurity:   . Worried About Charity fundraiser in the Last Year:   . [place] in the Last Year:   Transportation Needs:   . Lack of  Transportation (Medical):   Marland Kitchen Lack of Transportation (Non-Medical):   Physical Activity:   . Days of Exercise per Week:   . Minutes of Exercise per Session:   Stress:   . Feeling of Stress :   Social Connections:   . Frequency of Communication with Friends and Family:   . Frequency of Social Gatherings with Friends and Family:   . Attends Religious Services:   . Active Member of Clubs or Organizations:   . Attends Archivist Meetings:   Marland Kitchen Marital Status:      Family History:  The patient's family history is not on file.   ROS General: Negative; No fevers, chills, or night sweats;  HEENT: Negative; No changes in vision or hearing, sinus congestion, difficulty swallowing Pulmonary: Negative; No cough, wheezing, shortness of breath, hemoptysis Cardiovascular: Negative; No chest pain, presyncope, syncope, palpitations GI: GERD GU: Negative; No dysuria, hematuria, or difficulty voiding Musculoskeletal: Negative; no myalgias, joint pain, or weakness Hematologic/Oncology: Negative; no easy bruising, bleeding Endocrine: Positive for diabetes Neuro: Positive for peripheral neuropathy Skin: Negative; No rashes or skin lesions Psychiatric: Negative; No behavioral problems, depression Sleep: Negative; No snoring, daytime sleepiness, hypersomnolence, bruxism, restless legs, hypnogognic hallucinations, no cataplexy Other comprehensive 14 point system review is negative.   PHYSICAL EXAM:   VS:  BP 120/70 (BP Location: Right  Arm, Patient Position: Sitting, Cuff Size: Large)   Pulse 63   Ht '5\' 9"'  (1.753 Bennett)   Wt 230 lb (104.3 kg)   BMI 33.97 kg/Bennett     Repeat blood pressure by me was 124/74  Wt Readings from Last 3 Encounters:  05/06/20 230 lb (104.3 kg)  04/30/19 242 lb 6.4 oz (110 kg)  07/27/17 233 lb (105.7 kg)   General: Alert, oriented, no distress.  Skin: normal turgor, no rashes, warm and dry HEENT: Normocephalic, atraumatic. Pupils equal round and reactive to light; sclera anicteric; extraocular muscles intact;  Nose without nasal septal hypertrophy Mouth/Parynx benign; Mallinpatti scale 3 Neck: No JVD, no carotid bruits; normal carotid upstroke Lungs: clear to ausculatation and percussion; no wheezing or rales Chest wall: Tenderness to palpation over the left costochondral region Heart: PMI not displaced, RRR, s1 s2 normal, 1/6 systolic murmur, no diastolic murmur, no rubs, gallops, thrills, or heaves Abdomen: soft, nontender; no hepatosplenomehaly, BS+; abdominal aorta nontender and not dilated by palpation. Back: no CVA tenderness Pulses 2+ Musculoskeletal: full range of motion, normal strength, no joint deformities Extremities: no clubbing cyanosis or edema, Homan's sign negative  Neurologic: grossly nonfocal; Cranial nerves grossly wnl Psychologic: Normal mood and affect   Studies/Labs Reviewed:   EKG:  EKG is ordered today.  ECG (independently read by me): NSR at 63; no ectopy, normal intervals  April 30, 2019 ECG (independently read by me): NSR at 60; resolution of prior anterior T wave inversion since 07/28/2017  Recent Labs: BMP Latest Ref Rng & Units 07/27/2017 07/12/2017 07/11/2017  Glucose 65 - 99 mg/dL 97 235(H) 143(H)  BUN 6 - 24 mg/dL 8 24(H) 11  Creatinine 0.57 - 1.00 mg/dL 1.05(H) 1.24(H) 0.94  BUN/Creat Ratio 9 - 23 8(L) - -  Sodium 134 - 144 mmol/L 143 134(L) 135  Potassium 3.5 - 5.2 mmol/L 3.9 3.4(L) 4.0  Chloride 96 - 106 mmol/L 107(H) 97(L) 96(L)  CO2 20 - 29 mmol/L '25 27  27  ' Calcium 8.7 - 10.2 mg/dL 9.2 8.8(L) 9.4     Hepatic Function Latest Ref Rng & Units 07/09/2017  Total Protein 6.5 - 8.1  g/dL 7.1  Albumin 3.5 - 5.0 g/dL 3.4(L)  AST 15 - 41 U/L 20  ALT 14 - 54 U/L 14  Alk Phosphatase 38 - 126 U/L 53  Total Bilirubin 0.3 - 1.2 mg/dL 0.3    CBC Latest Ref Rng & Units 07/11/2017 07/10/2017 07/09/2017  WBC 4.0 - 10.5 K/uL 12.1(H) 7.9 11.3(H)  Hemoglobin 12.0 - 15.0 g/dL 14.0 11.7(L) 11.7(L)  Hematocrit 36 - 46 % 42.9 36.1 35.4(L)  Platelets 150 - 400 K/uL 220 146(L) 180   Lab Results  Component Value Date   MCV 87.2 07/11/2017   MCV 86.6 07/10/2017   MCV 86.3 07/09/2017   No results found for: TSH No results found for: HGBA1C   BNP No results found for: BNP  ProBNP No results found for: PROBNP   Lipid Panel  No results found for: CHOL, TRIG, HDL, CHOLHDL, VLDL, LDLCALC, LDLDIRECT   RADIOLOGY: No results found.   Additional studies/ records that were reviewed today include:  I reviewed her records from her hospitalization in September 2018 including catheterization report, echo Doppler data, and subsequent evaluation with Almyra Deforest, PA-C   Prox RCA lesion, 35 %stenosed.  Prox LAD lesion, 0 %stenosed.  Prox Cx to Mid Cx lesion, 50 %stenosed.  A STENT SIERRA 3.00 X 15 MM drug eluting stent was successfully placed, and does not overlap previously placed stent.  Mid LAD lesion, 70 %stenosed.  Post intervention, there is a 0% residual stenosis.  There is moderate left ventricular systolic dysfunction.  The left ventricular ejection fraction is 35-45% by visual estimate.  LV end diastolic pressure is severely elevated.   1. Single vessel obstructive CAD with 70% mid LAD. FFR 0.79. Stent in proximal LAD is patent 2. Severe LV dysfunction. Wall motion abnormality is more consistent with Takotsubo pattern 3. Markedly elevated LVEDP 4. Successful stenting of the mid LAD with DES.  Plan: her clinical presentation, Ecg findings, LV  abnormality are most consistent with Takotsubo syndrome. The mid LAD stenosis was not critical and is unlikely to have caused these findings. It was borderline obstructive with abnormal FFR so we did proceed with stenting of this lesion. Will continue DAPT for one year. IV diuresis. Optimize BP control with beta blocker and ACEi.      ASSESSMENT:    1. Non-ST elevation (NSTEMI) myocardial infarction [facility]): September 2018   2. Ischemic cardiomyopathy   3. Hyperlipidemia, unspecified hyperlipidemia type   4. Hypertension, unspecified type   5. Takotsubo cardiomyopathy   6. Essential hypertension   7. Type 2 diabetes mellitus with complication, without long-term current use of insulin ([place])     PLAN:   1.  CAD: In 2014 she underwent bare-metal stenting of her LAD at [facility].  On July 09, 2017 she underwent stenting of her mid LAD a patent proximal LAD stent and 50% circumflex and 35% RCA stenoses.  Presently, is not having any anginal symptoms on her regimen consisting of carvedilol 6.25 mg twice a day in addition to her lisinopril and spironolactone.  She continues to be on DAPT with aspirin Plavix and denies bleeding.  2.  Takotsubo cardiomyopathy: Her clinical presentation, ECG findings, and LV abnormality were felt to be due to the syndrome in September 2018.  EKG today shows resolution of her prior T wave abnormalities.  She is breathing well denies shortness of breath except during significant activity.  She is not had follow-up echocardiography since her initial echo in September 2018 revealed an EF of 35  to 40%.  I am recommending a follow-up echo Doppler study.  Continue lisinopril, carvedilol and spironolactone.    3.  Atypical chest pain: She has musculoskeletal chest pain and costochondral tenderness on palpation of her chest wall.  4.  Type 2 diabetes mellitus with complication: She is on Farxiga for SGL T2 inhibition in addition to Metformin.  5.  Essential  hypertension: Blood pressure today is stable.  At her last evaluation lisinopril had been titrated that she will continue her regimen of lisinopril 5 mg, spironolactone 12.5 mg and carvedilol 6.25 mg twice a day.  6.  Hyperlipidemia: LDL goal less than 70, she is on atorvastatin 80 mg.  Laboratory has been checked by her primary provider  7.  Peripheral neuropathy: She continues to be on pregabalin.  8.  GERD: Stable on pantoprazole  In addition to echo Doppler study, fasting laboratory will be obtained including a comprehensive metabolic panel, CBC, TSH, lipid studies in addition to hemoglobin A1c.  I will see her in 3 months for follow-up evaluation.   Medication Adjustments/Labs and Tests Ordered: Current medicines are reviewed at length with the patient today.  Concerns regarding medicines are outlined above.  Medication changes, Labs and Tests ordered today are listed in the Patient Instructions below. Patient Instructions  Medication Instructions:  CONTINUE WITH CURRENT MEDICATIONS. NO CHANGES.  *If you need a refill on your cardiac medications before your next appointment, please call your pharmacy*   Lab Work: FASTING LABS: CMET CBC TSH LIPID HGBA1C  If you have labs (blood work) drawn today and your tests are completely normal, you will receive your results only by: Marland Kitchen MyChart Message (if you have MyChart) OR . A paper copy in the mail If you have any lab test that is abnormal or we need to change your treatment, we will call you to review the results.   Testing/Procedures: Your physician has requested that you have an echocardiogram. Echocardiography is a painless test that uses sound waves to create images of your heart. It provides your doctor with information about the size and shape of your heart and how well your heart's chambers and valves are working. This procedure takes approximately one hour. There are no restrictions for this procedure.     Follow-Up: At  [facility], you and your health needs are our priority.  As part of our continuing mission to provide you with exceptional heart care, we have created designated Provider Care Teams.  These Care Teams include your primary Cardiologist (physician) and Advanced Practice Providers (APPs -  Physician Assistants and Nurse Practitioners) who all work together to provide you with the care you need, when you need it.  We recommend signing up for the patient portal called "MyChart".  Sign up information is provided on this After Visit Summary.  MyChart is used to connect with patients for Virtual Visits (Telemedicine).  Patients are able to view lab/test results, encounter notes, upcoming appointments, etc.  Non-urgent messages can be sent to your provider as well.   To learn more about what you can do with MyChart, go to [email or web address].    Your next appointment:   3 month(s)  The format for your next appointment:   In Person  Provider:   Shelva Majestic, MD        Signed, Alice Majestic, MD  05/07/2020 7:36 AM    [place] [address] [place], [place],   [address] Phone: [phone]

## 2020-05-06 NOTE — Patient Instructions (Signed)
Medication Instructions:  CONTINUE WITH CURRENT MEDICATIONS. NO CHANGES.  *If you need a refill on your cardiac medications before your next appointment, please call your pharmacy*   Lab Work: FASTING LABS: CMET CBC TSH LIPID HGBA1C  If you have labs (blood work) drawn today and your tests are completely normal, you will receive your results only by: Marland Kitchen MyChart Message (if you have MyChart) OR . A paper copy in the mail If you have any lab test that is abnormal or we need to change your treatment, we will call you to review the results.   Testing/Procedures: Your physician has requested that you have an echocardiogram. Echocardiography is a painless test that uses sound waves to create images of your heart. It provides your doctor with information about the size and shape of your heart and how well your heart's chambers and valves are working. This procedure takes approximately one hour. There are no restrictions for this procedure.     Follow-Up: At [facility], you and your health needs are our priority.  As part of our continuing mission to provide you with exceptional heart care, we have created designated Provider Care Teams.  These Care Teams include your primary Cardiologist (physician) and Advanced Practice Providers (APPs -  Physician Assistants and Nurse Practitioners) who all work together to provide you with the care you need, when you need it.  We recommend signing up for the patient portal called "MyChart".  Sign up information is provided on this After Visit Summary.  MyChart is used to connect with patients for Virtual Visits (Telemedicine).  Patients are able to view lab/test results, encounter notes, upcoming appointments, etc.  Non-urgent messages can be sent to your provider as well.   To learn more about what you can do with MyChart, go to [email or web address].    Your next appointment:   3 month(s)  The format for your next appointment:   In  Person  Provider:   Nicki Guadalajara, MD

## 2020-05-07 ENCOUNTER — Encounter: Payer: Self-pay | Admitting: Cardiovascular Disease

## 2020-05-30 ENCOUNTER — Ambulatory Visit (HOSPITAL_COMMUNITY): Payer: Medicaid Other | Attending: Cardiology

## 2020-05-30 ENCOUNTER — Other Ambulatory Visit: Payer: Self-pay

## 2020-05-30 DIAGNOSIS — I10 Essential (primary) hypertension: Secondary | ICD-10-CM | POA: Diagnosis present

## 2020-05-30 DIAGNOSIS — E785 Hyperlipidemia, unspecified: Secondary | ICD-10-CM

## 2020-05-30 DIAGNOSIS — I214 Non-ST elevation (NSTEMI) myocardial infarction: Secondary | ICD-10-CM

## 2020-05-30 DIAGNOSIS — I255 Ischemic cardiomyopathy: Secondary | ICD-10-CM | POA: Insufficient documentation

## 2020-05-30 LAB — ECHOCARDIOGRAM COMPLETE
Area-P 1/2: 2.75 cm2
P 1/2 time: 822 msec
S' Lateral: 3.2 cm

## 2020-05-31 LAB — COMPREHENSIVE METABOLIC PANEL
ALT: 15 IU/L (ref 0–32)
AST: 10 IU/L (ref 0–40)
Albumin/Globulin Ratio: 1.5 (ref 1.2–2.2)
Albumin: 4.5 g/dL (ref 3.8–4.9)
Alkaline Phosphatase: 97 IU/L (ref 48–121)
BUN/Creatinine Ratio: 12 (ref 9–23)
BUN: 15 mg/dL (ref 6–24)
Bilirubin Total: 0.3 mg/dL (ref 0.0–1.2)
CO2: 26 mmol/L (ref 20–29)
Calcium: 9.5 mg/dL (ref 8.7–10.2)
Chloride: 104 mmol/L (ref 96–106)
Creatinine, Ser: 1.21 mg/dL — ABNORMAL HIGH (ref 0.57–1.00)
GFR calc Af Amer: 57 mL/min/{1.73_m2} — ABNORMAL LOW (ref >59)
GFR calc non Af Amer: 49 mL/min/{1.73_m2} — ABNORMAL LOW (ref >59)
Globulin, Total: 3 g/dL (ref 1.5–4.5)
Glucose: 116 mg/dL — ABNORMAL HIGH (ref 65–99)
Potassium: 5 mmol/L (ref 3.5–5.2)
Sodium: 143 mmol/L (ref 134–144)
Total Protein: 7.5 g/dL (ref 6.0–8.5)

## 2020-05-31 LAB — LIPID PANEL
Chol/HDL Ratio: 5.9 ratio — ABNORMAL HIGH (ref 0.0–4.4)
Cholesterol, Total: 207 mg/dL — ABNORMAL HIGH (ref 100–199)
HDL: 35 mg/dL — ABNORMAL LOW (ref >39)
LDL Chol Calc (NIH): 123 mg/dL — ABNORMAL HIGH (ref 0–99)
Triglycerides: 277 mg/dL — ABNORMAL HIGH (ref 0–149)
VLDL Cholesterol Cal: 49 mg/dL — ABNORMAL HIGH (ref 5–40)

## 2020-05-31 LAB — CBC
Hematocrit: 45.5 % (ref 34.0–46.6)
Hemoglobin: 14.7 g/dL (ref 11.1–15.9)
MCH: 28.5 pg (ref 26.6–33.0)
MCHC: 32.3 g/dL (ref 31.5–35.7)
MCV: 88 fL (ref 79–97)
Platelets: 217 10*3/uL (ref 150–450)
RBC: 5.16 x10E6/uL (ref 3.77–5.28)
RDW: 14.6 % (ref 11.7–15.4)
WBC: 7.9 10*3/uL (ref 3.4–10.8)

## 2020-05-31 LAB — TSH: TSH: 2.98 u[IU]/mL (ref 0.450–4.500)

## 2020-05-31 LAB — HEMOGLOBIN A1C
Est. average glucose Bld gHb Est-mCnc: 166 mg/dL
Hgb A1c MFr Bld: 7.4 % — ABNORMAL HIGH (ref 4.8–5.6)

## 2020-06-04 ENCOUNTER — Other Ambulatory Visit: Payer: Self-pay

## 2020-06-04 DIAGNOSIS — I255 Ischemic cardiomyopathy: Secondary | ICD-10-CM

## 2020-06-04 DIAGNOSIS — E785 Hyperlipidemia, unspecified: Secondary | ICD-10-CM

## 2020-06-04 DIAGNOSIS — I214 Non-ST elevation (NSTEMI) myocardial infarction: Secondary | ICD-10-CM

## 2020-06-04 DIAGNOSIS — I10 Essential (primary) hypertension: Secondary | ICD-10-CM

## 2020-06-04 DIAGNOSIS — I5181 Takotsubo syndrome: Secondary | ICD-10-CM

## 2020-06-04 MED ORDER — EZETIMIBE 10 MG PO TABS
10.0000 mg | ORAL_TABLET | Freq: Every day | ORAL | 3 refills | Status: DC
Start: 2020-06-04 — End: 2021-01-02

## 2020-06-04 MED ORDER — ICOSAPENT ETHYL 1 G PO CAPS
2.0000 g | ORAL_CAPSULE | Freq: Two times a day (BID) | ORAL | 3 refills | Status: DC
Start: 2020-06-04 — End: 2020-07-10

## 2020-06-04 NOTE — Progress Notes (Signed)
Pt states to send her lab work to [address] [place], [place] [address]

## 2020-06-09 ENCOUNTER — Emergency Department (HOSPITAL_COMMUNITY)
Admission: EM | Admit: 2020-06-09 | Discharge: 2020-06-09 | Disposition: A | Discharge status: Home / Self Care | Payer: Medicaid Other | Attending: Emergency Medicine | Admitting: Emergency Medicine

## 2020-06-09 ENCOUNTER — Other Ambulatory Visit: Payer: Self-pay

## 2020-06-09 ENCOUNTER — Encounter (HOSPITAL_COMMUNITY): Payer: Self-pay | Admitting: *Deleted

## 2020-06-09 DIAGNOSIS — R197 Diarrhea, unspecified: Secondary | ICD-10-CM | POA: Diagnosis not present

## 2020-06-09 DIAGNOSIS — I25110 Atherosclerotic heart disease of native coronary artery with unstable angina pectoris: Secondary | ICD-10-CM | POA: Diagnosis not present

## 2020-06-09 DIAGNOSIS — R1110 Vomiting, unspecified: Secondary | ICD-10-CM | POA: Insufficient documentation

## 2020-06-09 DIAGNOSIS — Z7982 Long term (current) use of aspirin: Secondary | ICD-10-CM | POA: Diagnosis not present

## 2020-06-09 DIAGNOSIS — F17210 Nicotine dependence, cigarettes, uncomplicated: Secondary | ICD-10-CM | POA: Insufficient documentation

## 2020-06-09 DIAGNOSIS — N189 Chronic kidney disease, unspecified: Secondary | ICD-10-CM | POA: Diagnosis not present

## 2020-06-09 DIAGNOSIS — E1122 Type 2 diabetes mellitus with diabetic chronic kidney disease: Secondary | ICD-10-CM | POA: Diagnosis not present

## 2020-06-09 DIAGNOSIS — K625 Hemorrhage of anus and rectum: Secondary | ICD-10-CM | POA: Diagnosis not present

## 2020-06-09 DIAGNOSIS — I129 Hypertensive chronic kidney disease with stage 1 through stage 4 chronic kidney disease, or unspecified chronic kidney disease: Secondary | ICD-10-CM | POA: Diagnosis not present

## 2020-06-09 DIAGNOSIS — I214 Non-ST elevation (NSTEMI) myocardial infarction: Secondary | ICD-10-CM | POA: Diagnosis not present

## 2020-06-09 DIAGNOSIS — Z79899 Other long term (current) drug therapy: Secondary | ICD-10-CM | POA: Diagnosis not present

## 2020-06-09 DIAGNOSIS — Z7984 Long term (current) use of oral hypoglycemic drugs: Secondary | ICD-10-CM | POA: Diagnosis not present

## 2020-06-09 DIAGNOSIS — R109 Unspecified abdominal pain: Secondary | ICD-10-CM | POA: Diagnosis present

## 2020-06-09 LAB — COMPREHENSIVE METABOLIC PANEL
ALT: 14 U/L (ref 0–44)
AST: 14 U/L — ABNORMAL LOW (ref 15–41)
Albumin: 3.6 g/dL (ref 3.5–5.0)
Alkaline Phosphatase: 73 U/L (ref 38–126)
Anion gap: 9 (ref 5–15)
BUN: 16 mg/dL (ref 6–20)
CO2: 25 mmol/L (ref 22–32)
Calcium: 9.2 mg/dL (ref 8.9–10.3)
Chloride: 103 mmol/L (ref 98–111)
Creatinine, Ser: 1.28 mg/dL — ABNORMAL HIGH (ref 0.44–1.00)
GFR calc Af Amer: 53 mL/min — ABNORMAL LOW (ref >60)
GFR calc non Af Amer: 46 mL/min — ABNORMAL LOW (ref >60)
Glucose, Bld: 160 mg/dL — ABNORMAL HIGH (ref 70–99)
Potassium: 3.5 mmol/L (ref 3.5–5.1)
Sodium: 137 mmol/L (ref 135–145)
Total Bilirubin: 0.7 mg/dL (ref 0.3–1.2)
Total Protein: 7.4 g/dL (ref 6.5–8.1)

## 2020-06-09 LAB — URINALYSIS, ROUTINE W REFLEX MICROSCOPIC
Bacteria, UA: NONE SEEN
Bilirubin Urine: NEGATIVE
Glucose, UA: >=500 mg/dL — AB
Ketones, ur: NEGATIVE mg/dL
Leukocytes,Ua: NEGATIVE
Nitrite: NEGATIVE
Protein, ur: NEGATIVE mg/dL
Specific Gravity, Urine: 1.01 (ref 1.005–1.030)
pH: 5 (ref 5.0–8.0)

## 2020-06-09 LAB — CBC
HCT: 42.6 % (ref 36.0–46.0)
Hemoglobin: 13.5 g/dL (ref 12.0–15.0)
MCH: 28.1 pg (ref 26.0–34.0)
MCHC: 31.7 g/dL (ref 30.0–36.0)
MCV: 88.8 fL (ref 80.0–100.0)
Platelets: 226 10*3/uL (ref 150–400)
RBC: 4.8 MIL/uL (ref 3.87–5.11)
RDW: 14.9 % (ref 11.5–15.5)
WBC: 11.3 10*3/uL — ABNORMAL HIGH (ref 4.0–10.5)
nRBC: 0 % (ref 0.0–0.2)

## 2020-06-09 LAB — I-STAT BETA HCG BLOOD, ED (MC, WL, AP ONLY): I-stat hCG, quantitative: <5 m[IU]/mL (ref <5)

## 2020-06-09 LAB — POC OCCULT BLOOD, ED: Fecal Occult Bld: POSITIVE — AB

## 2020-06-09 LAB — LIPASE, BLOOD: Lipase: 29 U/L (ref 11–51)

## 2020-06-09 MED ORDER — SODIUM CHLORIDE 0.9% FLUSH: 3.0000 mL | Freq: Once | INTRAVENOUS | Status: DC

## 2020-06-09 NOTE — ED Notes (Signed)
Verbalized understanding of DC instructions and follow up care with GI.

## 2020-06-09 NOTE — ED Provider Notes (Signed)
[facility] EMERGENCY DEPARTMENT Provider Note   CSN: [id number] Arrival date & time: 06/09/20  0246     History Chief Complaint  Patient presents with  . Abdominal Pain    Alice Bennett is a 59 y.o. female with pmhx of CAD (PCI/DEC 08-05-17), DM, HLD, CKD, HTN presenting with BRBPR today. Patient reports that she had 4-5 episodes of diarrhea starting around 2 pm which is not normal for her. On Saturday, she had 3 episodes of NBNB emesis. She has not had n/v since. She denies fevers, current abdominal pain, chest pain. She has been able to tolerate PO solids and liquids. She has never had blood in her stool in the past. Patient reports history of normal colonoscopy around her 36s. She does not have a gastroenterologist at this time. No family history of colon cancer or other GI cancers She does also reports some light-headedness but no syncope.      Past Medical History:  Diagnosis Date  . CAD (coronary artery disease)    07/10/17 PCI/DES to LAD, EF 35-40%  . Diabetes mellitus without complication (HCC)   . Hyperlipidemia   . Hypertension   . NSTEMI (non-ST elevated myocardial infarction) [facility])     Patient Active Problem List   Diagnosis Date Noted  . Hyperlipemia 07/12/2017  . Hypertension 07/12/2017  . Non-ST elevation (NSTEMI) myocardial infarction (HCC)   . Ischemic cardiomyopathy   . Unstable angina (HCC) 07/09/2017    Past Surgical History:  Procedure Laterality Date  . CORONARY STENT INTERVENTION N/A 07/10/2017   Procedure: CORONARY STENT INTERVENTION;  Surgeon: Swaziland, Peter M, MD;  Location: [facility] INVASIVE CV LAB;  Service: Cardiovascular;  Laterality: N/A;  . INTRAVASCULAR PRESSURE WIRE/FFR STUDY N/A 07/10/2017   Procedure: INTRAVASCULAR PRESSURE WIRE/FFR STUDY;  Surgeon: Swaziland, Peter M, MD;  Location: [facility] INVASIVE CV LAB;  Service: Cardiovascular;  Laterality: N/A;  . LEFT HEART CATH AND CORONARY ANGIOGRAPHY N/A 07/10/2017   Procedure: LEFT HEART CATH  AND CORONARY ANGIOGRAPHY;  Surgeon: Swaziland, Peter M, MD;  Location: [facility] INVASIVE CV LAB;  Service: Cardiovascular;  Laterality: N/A;     OB History   No obstetric history on file.    No family history on file.  Social History   Tobacco Use  . Smoking status: Current Every Day Smoker    Packs/day: 0.50    Years: 10.00    Pack years: 5.00    Types: Cigarettes  . Smokeless tobacco: Never Used  Vaping Use  . Vaping Use: Never used  Substance Use Topics  . Alcohol use: No  . Drug use: No    Home Medications Prior to Admission medications   Medication Sig Start Date End Date Taking? Authorizing Provider  aspirin EC 81 MG tablet Take 81 mg by mouth daily.   Yes [provider]  atorvastatin (LIPITOR) 80 MG tablet Take 1 tablet (80 mg total) by mouth daily at 6 PM. 07/19/17  Yes Lennette Bihari, MD  carvedilol (COREG) 6.25 MG tablet Take 1 tablet (6.25 mg total) by mouth 2 (two) times daily with a meal. 07/19/17  Yes Lennette Bihari, MD  clonazePAM (KLONOPIN) 1 MG tablet Take 1 mg by mouth 2 (two) times daily.   Yes [provider]  clopidogrel (PLAVIX) 75 MG tablet Take 1 tablet (75 mg total) by mouth daily. 07/19/17  Yes Lennette Bihari, MD  ezetimibe (ZETIA) 10 MG tablet Take 1 tablet (10 mg total) by mouth daily. 06/04/20 09/02/20 Yes  Lennette Bihari, MD  FARXIGA 10 MG TABS tablet Take 10 mg by mouth daily.  03/28/19  Yes [provider]  icosapent Ethyl (VASCEPA) 1 g capsule Take 2 capsules (2 g total) by mouth 2 (two) times daily. 06/04/20  Yes Lennette Bihari, MD  lisinopril (ZESTRIL) 5 MG tablet Take 1 tablet (5 mg total) by mouth daily. 04/30/19  Yes Lennette Bihari, MD  metFORMIN (GLUCOPHAGE-XR) 500 MG 24 hr tablet Take 500 mg by mouth 2 (two) times daily. 04/25/20  Yes [provider]  Multiple Vitamin (MULTI-VITAMIN) tablet Take by mouth.   Yes [provider]  nitroGLYCERIN (NITROSTAT) 0.4 MG SL tablet Place 0.4 mg under the tongue as  needed for chest pain. 04/25/20  Yes [provider]  pantoprazole (PROTONIX) 40 MG tablet Take 1 tablet (40 mg total) by mouth daily. 07/19/17  Yes Lennette Bihari, MD  pregabalin (LYRICA) 150 MG capsule Take 150 mg by mouth 2 (two) times daily.   Yes [provider]  sertraline (ZOLOFT) 100 MG tablet Take 150 mg by mouth daily.    Yes [provider]  spironolactone (ALDACTONE) 25 MG tablet Take 0.5 tablets (12.5 mg total) by mouth daily. May take extra 0.5 tablet as needed if swelling occurs. 04/30/19 06/09/20 Yes Lennette Bihari, MD  TRULICITY 0.75 MG/0.5ML SOPN Inject 0.75 mg into the skin once a week. 04/10/20  Yes [provider]  metFORMIN (GLUCOPHAGE) 500 MG tablet TAKE 2 TABLETS BY MOUTH TWICE DAILY FOR 30 DAYS 02/12/19   [provider]  nitroGLYCERIN (NITROLINGUAL) 0.4 MG/SPRAY spray Place 1 spray under the tongue every 5 (five) minutes x 3 doses as needed for chest pain. Patient not taking: Reported on 06/09/2020 07/27/17   Azalee Course, PA    Allergies    Codeine  Review of Systems   Review of Systems  Physical Exam Updated Vital Signs BP (!) 139/59 (BP Location: Left Arm)   Pulse 64   Temp 98.5 F (36.9 C) (Oral)   Resp 16   Ht 5\' 9"  (1.753 m)   Wt (!) 101.6 kg   SpO2 93%   BMI 33.08 kg/m   Physical Exam  ED Results / Procedures / Treatments   Labs (all labs ordered are listed, but only abnormal results are displayed) Labs Reviewed  COMPREHENSIVE METABOLIC PANEL - Abnormal; Notable for the following components:      Result Value   Glucose, Bld 160 (*)    Creatinine, Ser 1.28 (*)    AST 14 (*)    GFR calc non Af Amer 46 (*)    GFR calc Af Amer 53 (*)    All other components within normal limits  CBC - Abnormal; Notable for the following components:   WBC 11.3 (*)    All other components within normal limits  URINALYSIS, ROUTINE W REFLEX MICROSCOPIC - Abnormal; Notable for the following components:   Color, Urine STRAW (*)     Glucose, UA >=500 (*)    Hgb urine dipstick SMALL (*)    All other components within normal limits  POC OCCULT BLOOD, ED - Abnormal; Notable for the following components:   Fecal Occult Bld POSITIVE (*)    All other components within normal limits  LIPASE, BLOOD  OCCULT BLOOD X 1 CARD TO LAB, STOOL  I-STAT BETA HCG BLOOD, ED ([facility], [facility], [facility] ONLY)    EKG None  Radiology No results found.  Procedures Procedures (including critical care time)  Medications Ordered in ED Medications  sodium chloride flush (NS) 0.9 % injection 3 mL (has no administration in time range)    ED Course  I have reviewed the triage vital signs and the nursing notes.  Pertinent labs & imaging results that were available during my care of the patient were reviewed by me and considered in my medical decision making (see chart for details).    MDM Rules/Calculators/A&P                          60 year old female with history of CAD, DM, HLD, CKD, HTN who presents with BRB PR x1 this morning.  She does report some diarrhea and vomiting over the weekend though has otherwise been able to tolerate p.o. liquids and solids.  She has not had any other bleeding.  On physical exam, there is no tenderness to palpation abdomen and there is no obvious anal fissure or hemorrhoid palpated on rectal exam.  FOBT collected she is otherwise well-appearing.  CBC within normal limits without evidence of anemia.  Patient's vitals otherwise stable with no current complaints.  Check in with patient and she continues to feel well. No complaints at this time. She is not had any further bleeding episodes while she has been here. Given her stable hemoglobin and vital signs, she will be discharged from the ED and provided the contact information for [facility] gastroenterology for follow-up. This is likely a viral gastroenteritis given preceding emesis and diarrhea with abdominal pain. Patient also advised to follow-up with her primary care  physician.  Final Clinical Impression(s) / ED Diagnoses Final diagnoses:  Bright red blood per rectum    Rx / DC Orders ED Discharge Orders    None      Melene Plan, M.D.  4:32 PM 06/09/2020      Melene Plan, MD 06/09/20 [id number]    Cathren Laine, MD 06/12/20 1058

## 2020-06-09 NOTE — ED Triage Notes (Signed)
The pt is c/o some abd pain since thrusday  She has noticed bright and dark blood in her stools

## 2020-06-09 NOTE — Discharge Instructions (Signed)
If you continue to have bright red bleeding associated with lightheadedness, dizziness, falling out, please go straight to the ED as this may be a sign that you are low on blood. Please follow-up with gastroenterologist as well as your PCP. You can continue to take your aspirin unless you start to have diffuse bleeding.

## 2020-06-10 ENCOUNTER — Encounter: Payer: Self-pay | Admitting: Nurse Practitioner

## 2020-06-26 ENCOUNTER — Telehealth: Payer: Self-pay

## 2020-06-26 NOTE — Telephone Encounter (Signed)
Alice Bennett (Key: [id number]) Sent to  Plan today [employer] The plan will fax you a determination, typically within 1 to 5 business days.  Drug Icosapent Ethyl 1GM capsules

## 2020-07-04 ENCOUNTER — Telehealth: Payer: Self-pay

## 2020-07-04 NOTE — Telephone Encounter (Signed)
[id number]. Need to notify pt that her vascepa was denied and that Dr.Kelly would like to change her to Lovaza

## 2020-07-10 ENCOUNTER — Other Ambulatory Visit: Payer: Self-pay

## 2020-07-10 MED ORDER — OMEGA-3-ACID ETHYL ESTERS 1 G PO CAPS
2.0000 g | ORAL_CAPSULE | Freq: Two times a day (BID) | ORAL | 3 refills | Status: DC
Start: 2020-07-10 — End: 2022-07-20

## 2020-07-10 NOTE — Telephone Encounter (Signed)
acknowledged

## 2020-07-10 NOTE — Telephone Encounter (Signed)
Called and spoke with pt. Notified that insurance would not cover her Vascepa any longer and that Dr.Kelly suggested Lovaza at the same dosage. Pt states she has been paying out of pocket for her Vascepa but it hurts her stomach so she would be willing to try the other medication. Notified that they were similar medications so I was unsure how it would effect her stomach but that hopefully insurance would help cover this. Pt verbalized understanding and was agreeable to medication switch. No other questions at this time. Will send prescription for Lovaza 1g- take 2 capsules twice a day to her pharmacy.  Will make Dr.Kelly aware. 

## 2020-07-10 NOTE — Progress Notes (Signed)
Called and spoke with pt. Notified that insurance would not cover her Vascepa any longer and that Dr.Kelly suggested Lovaza at the same dosage. Pt states she has been paying out of pocket for her Vascepa but it hurts her stomach so she would be willing to try the other medication. Notified that they were similar medications so I was unsure how it would effect her stomach but that hopefully insurance would help cover this. Pt verbalized understanding and was agreeable to medication switch. No other questions at this time. Will send prescription for Lovaza 1g- take 2 capsules twice a day to her pharmacy.  Will make Dr.Kelly aware.

## 2020-07-16 ENCOUNTER — Other Ambulatory Visit: Payer: Self-pay

## 2020-07-23 ENCOUNTER — Ambulatory Visit: Payer: Medicaid Other | Admitting: Nurse Practitioner

## 2020-07-30 ENCOUNTER — Ambulatory Visit: Payer: Medicaid Other | Admitting: Cardiovascular Disease

## 2020-08-15 ENCOUNTER — Other Ambulatory Visit: Payer: Self-pay | Admitting: Cardiovascular Disease

## 2020-08-15 MED ORDER — SPIRONOLACTONE 25 MG PO TABS: 12.5000 mg | ORAL_TABLET | Freq: Every day | ORAL | 6 refills | Status: DC

## 2020-08-15 NOTE — Telephone Encounter (Signed)
*  STAT* If patient is at the pharmacy, call can be transferred to refill team.   1. Which medications need to be refilled? (please list name of each medication and dose if known)  spironolactone (ALDACTONE) 25 MG tablet  2. Which pharmacy/location (including street and city if local pharmacy) is medication to be sent to? [facility] - [place], [place] - [address]  3. Do they need a 30 day or 90 day supply? 30 with refills    Patient is out of medication

## 2020-08-15 NOTE — Telephone Encounter (Signed)
Refilled patient's Spironolactone.

## 2020-12-30 ENCOUNTER — Telehealth: Payer: Self-pay | Admitting: Cardiovascular Disease

## 2020-12-30 NOTE — Telephone Encounter (Signed)
New Message:    Pt said she had an episode last Friday(12-26-20).   Pt c/o of Chest Pain: STAT if CP now or developed within 24 hours  1. Are you having CP right now? A little tightness in her chest  2. Are you experiencing any other symptoms (ex. SOB, nausea, vomiting, sweating)? All of these symptoms and left arm hurting  3. How long have you been experiencing CP?  Just that episode  4. Is your CP continuous or coming and going? It just stayed there for about 20 or 25 minutes  5. Have you taken Nitroglycerin? *took aspirin- she wanted an appt, I offerer her this week, she could not come- her appt is 01-06-21 with Wynema Birch ? t

## 2020-12-30 NOTE — Telephone Encounter (Signed)
Spoke with patient. On Friday 12/26/20 patient experienced chest pain while talking on the phone. She reports she thought it may be indigestion so she went to get Tums. By the time she made it to the kitchen she had to run to the bathroom to vomit. She had a pressure like pain in the middle of her chest and an aching pain under her left arm pit. She developed cold sweats and some shortness of breath as well. It lasted about 10 minutes in total for the chest pain. She chewed a baby aspirin and took a nap. She reports she felt like she did during her last heart attack.  Patient mentioned that she has been having left sided jaw pain that radiates down her left arm as well. It was not present during the chest pain episode but it has been coming and going.   No pain or symptoms at time of call. Advised patient to call 911 if symptoms return. She has an appointment on 01/06/21 with Azalee Course, PA-C. Will route to MD and PA for review.

## 2021-01-02 ENCOUNTER — Other Ambulatory Visit: Payer: Self-pay | Admitting: Cardiovascular Disease

## 2021-01-05 NOTE — Telephone Encounter (Signed)
Per [facility] Keep appointment for tomorrow.

## 2021-01-06 ENCOUNTER — Encounter: Payer: Self-pay | Admitting: Cardiovascular Disease

## 2021-01-06 ENCOUNTER — Ambulatory Visit (INDEPENDENT_AMBULATORY_CARE_PROVIDER_SITE_OTHER): Payer: Medicaid Other | Admitting: Physician Assistant

## 2021-01-06 ENCOUNTER — Other Ambulatory Visit: Payer: Self-pay

## 2021-01-06 ENCOUNTER — Encounter: Payer: Self-pay | Admitting: Physician Assistant

## 2021-01-06 VITALS — BP 142/84 | HR 73 | Ht 69.0 in | Wt 244.0 lb

## 2021-01-06 DIAGNOSIS — R079 Chest pain, unspecified: Secondary | ICD-10-CM

## 2021-01-06 DIAGNOSIS — E785 Hyperlipidemia, unspecified: Secondary | ICD-10-CM

## 2021-01-06 DIAGNOSIS — I10 Essential (primary) hypertension: Secondary | ICD-10-CM | POA: Diagnosis not present

## 2021-01-06 DIAGNOSIS — I25119 Atherosclerotic heart disease of native coronary artery with unspecified angina pectoris: Secondary | ICD-10-CM

## 2021-01-06 NOTE — Progress Notes (Signed)
Cardiology Office Note:    Date:  01/07/2021   ID:  Alice Bennett, DOB [birth year], MRN [id number]  PCP:  Retia Passe, NP   [facility]  Cardiologist:  Nicki Guadalajara, MD  Advanced Practice Provider:  No care team member to display Electrophysiologist:  None   Referring MD: Retia Passe, NP   Chief Complaint  Patient presents with  . Follow-up    Seen for Dr. Tresa Endo  . Chest Pain    History of Present Illness:    Alice Bennett is a 60 y.o. female with a hx of CAD, hypertension, hyperlipidemia, and NSTEMI. She had a bare-metal stent in her LAD at [facility] in 2014. Echocardiogram at [facility] in 2018 showed EF 35 to 40% with hypokinesis of the mid apical, anteroseptal, anterior inferolateral, inferior and inferoseptal myocardium, grade 2 DD. Subsequent cardiac catheterization revealed 30% proximal RCA lesion, patent proximal LAD stent, 70% mid LAD lesion with FFR of 0.79 treated with DES, 50% proximal to mid left circumflex lesion. 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 Spironolactone was added however later discontinued due to rising creatinine. Patient was last seen by Dr. Tresa Endo in June 2021 at which time she was doing well.  Echocardiogram obtained on 05/30/2020 showed EF 60 to 65%, mild LVH of basal septal segment, grade 1 DD, mild AI.  Alice Bennett is presents today for evaluation of chest pain.  So far, she has had 2 episodes of chest pain.  First episode occurred last Friday on 01/02/2021.  She says she had substernal chest pain rating to into the left breast and under the left armpit while on the phone talking to her sister.  Symptom was associated with diaphoresis, nausea and vomiting.  Overall duration of the symptom lasted about 15 minutes before resolving.  Her symptom recurred again yesterday while sitting down watching TV.  Interestingly, she does not have any exertional symptoms with walking recently.  EKG was normal.   I recommended proceeding with Myoview to rule out ischemic disease.    Past Medical History:  Diagnosis Date  . CAD (coronary artery disease)    07/10/17 PCI/DES to LAD, EF 35-40%  . Diabetes mellitus without complication (HCC)   . Hyperlipidemia   . Hypertension   . NSTEMI (non-ST elevated myocardial infarction) [facility])     Past Surgical History:  Procedure Laterality Date  . CORONARY STENT INTERVENTION N/A 07/10/2017   Procedure: CORONARY STENT INTERVENTION;  Surgeon: Swaziland, Peter M, MD;  Location: [facility] INVASIVE CV LAB;  Service: Cardiovascular;  Laterality: N/A;  . INTRAVASCULAR PRESSURE WIRE/FFR STUDY N/A 07/10/2017   Procedure: INTRAVASCULAR PRESSURE WIRE/FFR STUDY;  Surgeon: Swaziland, Peter M, MD;  Location: [facility] INVASIVE CV LAB;  Service: Cardiovascular;  Laterality: N/A;  . LEFT HEART CATH AND CORONARY ANGIOGRAPHY N/A 07/10/2017   Procedure: LEFT HEART CATH AND CORONARY ANGIOGRAPHY;  Surgeon: Swaziland, Peter M, MD;  Location: [facility] INVASIVE CV LAB;  Service: Cardiovascular;  Laterality: N/A;    Current Medications: Current Meds  Medication Sig  . aspirin EC 81 MG tablet Take 81 mg by mouth daily.  Marland Kitchen atorvastatin (LIPITOR) 80 MG tablet Take 1 tablet (80 mg total) by mouth daily at 6 PM.  . carvedilol (COREG) 6.25 MG tablet Take 1 tablet (6.25 mg total) by mouth 2 (two) times daily with a meal.  . clonazePAM (KLONOPIN) 1 MG tablet Take 1 mg by mouth 2 (two) times daily.  . clopidogrel (PLAVIX) 75 MG  tablet Take 1 tablet (75 mg total) by mouth daily.  Marland Kitchen ezetimibe (ZETIA) 10 MG tablet Take 1 tablet by mouth once daily  . FARXIGA 10 MG TABS tablet Take 10 mg by mouth daily.   Marland Kitchen lisinopril (ZESTRIL) 5 MG tablet Take 1 tablet (5 mg total) by mouth daily.  . metFORMIN (GLUCOPHAGE-XR) 500 MG 24 hr tablet Take 500 mg by mouth 2 (two) times daily.  . Multiple Vitamin (MULTI-VITAMIN) tablet Take by mouth.  . nitroGLYCERIN (NITROSTAT) 0.4 MG SL tablet Place 0.4 mg under the tongue as needed for chest  pain.  Marland Kitchen omega-3 acid ethyl esters (LOVAZA) 1 g capsule Take 2 capsules (2 g total) by mouth 2 (two) times daily.  . pantoprazole (PROTONIX) 40 MG tablet Take 1 tablet (40 mg total) by mouth daily.  . pregabalin (LYRICA) 150 MG capsule Take 150 mg by mouth 2 (two) times daily.  . sertraline (ZOLOFT) 100 MG tablet Take 150 mg by mouth daily.   . TRULICITY 0.75 MG/0.5ML SOPN Inject 0.75 mg into the skin once a week.     Allergies:   Codeine   Social History   Socioeconomic History  . Marital status: Married    Spouse name: Not on file  . Number of children: Not on file  . Years of education: Not on file  . Highest education level: Not on file  Occupational History  . Not on file  Tobacco Use  . Smoking status: Current Every Day Smoker    Packs/day: 0.50    Years: 10.00    Pack years: 5.00    Types: Cigarettes  . Smokeless tobacco: Never Used  Vaping Use  . Vaping Use: Never used  Substance and Sexual Activity  . Alcohol use: No  . Drug use: No  . Sexual activity: Not on file  Other Topics Concern  . Not on file  Social History Narrative  . Not on file   Social Determinants of Health   Financial Resource Strain: Not on file  Food Insecurity: Not on file  Transportation Needs: Not on file  Physical Activity: Not on file  Stress: Not on file  Social Connections: Not on file     Family History: The patient's family history is not on file.  ROS:   Please see the history of present illness.     All other systems reviewed and are negative.  EKGs/Labs/Other Studies Reviewed:    The following studies were reviewed today:  Echo 05/30/2020  1. Left ventricular ejection fraction, by estimation, is 60 to 65%. The  left ventricle has normal function. The left ventricle has no regional  wall motion abnormalities. There is mild left ventricular hypertrophy of  the basal-septal segment. Left  ventricular diastolic parameters are consistent with Grade I diastolic   dysfunction (impaired relaxation).  2. Right ventricular systolic function is normal. The right ventricular  size is normal. There is normal pulmonary artery systolic pressure.  3. The mitral valve is normal in structure. No evidence of mitral valve  regurgitation. No evidence of mitral stenosis.  4. The aortic valve is tricuspid. Aortic valve regurgitation is mild.  Mild aortic valve sclerosis is present, with no evidence of aortic valve  stenosis. Aortic regurgitation PHT measures 822 msec.  5. The inferior vena cava is normal in size with greater than 50%  respiratory variability, suggesting right atrial pressure of 3 mmHg.   Comparison(s): Compared to prior study, LVF has normalized.   EKG:  EKG is ordered  today.  The ekg ordered today demonstrates normal sinus rhythm, no significant ST-T wave changes  Recent Labs: 05/30/2020: TSH 2.980 06/09/2020: ALT 14; BUN 16; Creatinine, Ser 1.28; Hemoglobin 13.5; Platelets 226; Potassium 3.5; Sodium 137  Recent Lipid Panel    Component Value Date/Time   CHOL 207 (H) 05/30/2020 1223   TRIG 277 (H) 05/30/2020 1223   HDL 35 (L) 05/30/2020 1223   CHOLHDL 5.9 (H) 05/30/2020 1223   LDLCALC 123 (H) 05/30/2020 1223     Risk Assessment/Calculations:       Physical Exam:    VS:  BP (!) 142/84   Pulse 73   Ht 5\' 9"  (1.753 m)   Wt 244 lb (110.7 kg)   SpO2 96%   BMI 36.03 kg/m     Wt Readings from Last 3 Encounters:  01/06/21 244 lb (110.7 kg)  06/09/20 (!) 224 lb (101.6 kg)  05/06/20 230 lb (104.3 kg)     GEN:  Well nourished, well developed in no acute distress HEENT: Normal NECK: No JVD; No carotid bruits LYMPHATICS: No lymphadenopathy CARDIAC: RRR, no murmurs, rubs, gallops RESPIRATORY:  Clear to auscultation without rales, wheezing or rhonchi  ABDOMEN: Soft, non-tender, non-distended MUSCULOSKELETAL:  No edema; No deformity  SKIN: Warm and dry NEUROLOGIC:  Alert and oriented x 3 PSYCHIATRIC:  Normal affect    ASSESSMENT:    1. Chest pain, unspecified type   2. Coronary artery disease involving native coronary artery of native heart with angina pectoris (HCC)   3. Essential hypertension   4. Hyperlipidemia LDL goal <70    PLAN:    In order of problems listed above:  1. Chest pain: Recommend proceed with Myoview for further evaluation.  2. CAD: Previously had bare-metal stent to LAD in 2014.  Last cardiac catheterization performed in 2018 had a 70% mid LAD lesion that was treated with DES.  3. Hypertension: Blood pressure mildly elevated today, however normally her blood pressure is very well controlled  4. Hyperlipidemia: On Lipitor.   Shared Decision Making/Informed Consent The risks [chest pain, shortness of breath, cardiac arrhythmias, dizziness, blood pressure fluctuations, myocardial infarction, stroke/transient ischemic attack, nausea, vomiting, allergic reaction, radiation exposure, metallic taste sensation and life-threatening complications (estimated to be 1 in 10,000)], benefits (risk stratification, diagnosing coronary artery disease, treatment guidance) and alternatives of a nuclear stress test were discussed in detail with Alice Bennett and she agrees to proceed.       Medication Adjustments/Labs and Tests Ordered: Current medicines are reviewed at length with the patient today.  Concerns regarding medicines are outlined above.  Orders Placed This Encounter  Procedures  . MYOCARDIAL PERFUSION IMAGING  . EKG 12-Lead   No orders of the defined types were placed in this encounter.   Patient Instructions  Medication Instructions:  Your physician recommends that you continue on your current medications as directed. Please refer to the Current Medication list given to you today.  *If you need a refill on your cardiac medications before your next appointment, please call your pharmacy*  Lab Work: NONE ordered at this time of appointment   If you have labs (blood work)  drawn today and your tests are completely normal, you will receive your results only by: 2019 MyChart Message (if you have MyChart) OR . A paper copy in the mail If you have any lab test that is abnormal or we need to change your treatment, we will call you to review the results.  Testing/Procedures: Your physician has requested that  you have a lexiscan myoview. For further information please visit [email or web address]. Please follow instruction sheet, as given.   [facility]   Follow-Up: At [facility], you and your health needs are our priority.  As part of our continuing mission to provide you with exceptional heart care, we have created designated Provider Care Teams.  These Care Teams include your primary Cardiologist (physician) and Advanced Practice Providers (APPs -  Physician Assistants and Nurse Practitioners) who all work together to provide you with the care you need, when you need it.  We recommend signing up for the patient portal called "MyChart".  Sign up information is provided on this After Visit Summary.  MyChart is used to connect with patients for Virtual Visits (Telemedicine).  Patients are able to view lab/test results, encounter notes, upcoming appointments, etc.  Non-urgent messages can be sent to your provider as well.   To learn more about what you can do with MyChart, go to [email or web address].    Your next appointment:   2 month(s)  The format for your next appointment:   In Person  Provider:   Nicki Guadalajarahomas Kelly, MD  Other Instructions      Signed, Azalee CourseHao Muhammad Vacca, PA  01/07/2021 10:50 PM    [facility]

## 2021-01-06 NOTE — Patient Instructions (Signed)
Medication Instructions:  Your physician recommends that you continue on your current medications as directed. Please refer to the Current Medication list given to you today.  *If you need a refill on your cardiac medications before your next appointment, please call your pharmacy*  Lab Work: NONE ordered at this time of appointment   If you have labs (blood work) drawn today and your tests are completely normal, you will receive your results only by: Marland Kitchen MyChart Message (if you have MyChart) OR . A paper copy in the mail If you have any lab test that is abnormal or we need to change your treatment, we will call you to review the results.  Testing/Procedures: Your physician has requested that you have a lexiscan myoview. For further information please visit [email or web address] Please follow instruction sheet, as given.   Please schedule for 1 week at [facility] office   Follow-Up: At [facility], you and your health needs are our priority.  As part of our continuing mission to provide you with exceptional heart care, we have created designated Provider Care Teams.  These Care Teams include your primary Cardiologist (physician) and Advanced Practice Providers (APPs -  Physician Assistants and Nurse Practitioners) who all work together to provide you with the care you need, when you need it.  We recommend signing up for the patient portal called "MyChart".  Sign up information is provided on this After Visit Summary.  MyChart is used to connect with patients for Virtual Visits (Telemedicine).  Patients are able to view lab/test results, encounter notes, upcoming appointments, etc.  Non-urgent messages can be sent to your provider as well.   To learn more about what you can do with MyChart, go to [email or web address].    Your next appointment:   2 month(s)  The format for your next appointment:   In Person  Provider:   Nicki Guadalajara, MD  Other Instructions

## 2021-01-07 ENCOUNTER — Encounter: Payer: Self-pay | Admitting: Physician Assistant

## 2021-01-14 ENCOUNTER — Telehealth (HOSPITAL_COMMUNITY): Payer: Self-pay | Admitting: *Deleted

## 2021-01-14 NOTE — Telephone Encounter (Signed)
Patient given detailed instructions per Myocardial Perfusion Study Information Sheet for the test on 01/19/21 at 1:15. Patient notified to arrive 15 minutes early and that it is imperative to arrive on time for appointment to keep from having the test rescheduled.  If you need to cancel or reschedule your appointment, please call the office within 24 hours of your appointment. . Patient verbalized understanding.Alice Bennett

## 2021-01-19 ENCOUNTER — Ambulatory Visit (HOSPITAL_COMMUNITY): Payer: Medicaid Other | Attending: Cardiology

## 2021-01-19 ENCOUNTER — Other Ambulatory Visit: Payer: Self-pay

## 2021-01-19 DIAGNOSIS — R079 Chest pain, unspecified: Secondary | ICD-10-CM | POA: Insufficient documentation

## 2021-01-19 MED ORDER — REGADENOSON 0.4 MG/5ML IV SOLN
0.4000 mg | Freq: Once | INTRAVENOUS | Status: AC
  Administered 2021-01-19: 0.4 mg via INTRAVENOUS

## 2021-01-19 MED ORDER — TECHNETIUM TC 99M TETROFOSMIN IV KIT
30.7000 | PACK | Freq: Once | INTRAVENOUS | Status: AC | PRN
  Administered 2021-01-19: 30.7 via INTRAVENOUS
  Filled 2021-01-19: qty 31

## 2021-01-20 ENCOUNTER — Ambulatory Visit (HOSPITAL_COMMUNITY): Payer: Medicaid Other | Attending: Internal Medicine

## 2021-01-20 LAB — MYOCARDIAL PERFUSION IMAGING
LV dias vol: 96 mL (ref 46–106)
LV sys vol: 37 mL
Peak HR: 85 {beats}/min
Rest HR: 65 {beats}/min
SDS: 2
SRS: 2
SSS: 4
TID: 0.9

## 2021-01-20 MED ORDER — TECHNETIUM TC 99M TETROFOSMIN IV KIT
32.4000 | PACK | Freq: Once | INTRAVENOUS | Status: AC | PRN
  Administered 2021-01-20: 32.4 via INTRAVENOUS
  Filled 2021-01-20: qty 33

## 2021-03-09 ENCOUNTER — Other Ambulatory Visit: Payer: Self-pay

## 2021-03-09 ENCOUNTER — Ambulatory Visit (INDEPENDENT_AMBULATORY_CARE_PROVIDER_SITE_OTHER): Payer: Medicaid Other | Admitting: Cardiovascular Disease

## 2021-03-09 ENCOUNTER — Encounter: Payer: Self-pay | Admitting: Cardiovascular Disease

## 2021-03-09 VITALS — BP 150/86 | HR 59 | Ht 69.0 in | Wt 243.2 lb

## 2021-03-09 DIAGNOSIS — I214 Non-ST elevation (NSTEMI) myocardial infarction: Secondary | ICD-10-CM | POA: Diagnosis not present

## 2021-03-09 DIAGNOSIS — E118 Type 2 diabetes mellitus with unspecified complications: Secondary | ICD-10-CM | POA: Diagnosis not present

## 2021-03-09 DIAGNOSIS — E785 Hyperlipidemia, unspecified: Secondary | ICD-10-CM | POA: Diagnosis not present

## 2021-03-09 DIAGNOSIS — K219 Gastro-esophageal reflux disease without esophagitis: Secondary | ICD-10-CM

## 2021-03-09 DIAGNOSIS — I5181 Takotsubo syndrome: Secondary | ICD-10-CM

## 2021-03-09 DIAGNOSIS — I10 Essential (primary) hypertension: Secondary | ICD-10-CM

## 2021-03-09 DIAGNOSIS — R140 Abdominal distension (gaseous): Secondary | ICD-10-CM

## 2021-03-09 MED ORDER — SPIRONOLACTONE 25 MG PO TABS: 12.5000 mg | ORAL_TABLET | Freq: Every day | ORAL | 6 refills | Status: DC

## 2021-03-09 MED ORDER — OLMESARTAN MEDOXOMIL 20 MG PO TABS: 20.0000 mg | ORAL_TABLET | Freq: Every day | ORAL | 3 refills | Status: DC

## 2021-03-09 NOTE — Patient Instructions (Addendum)
Medication Instructions:  Stop taking lisinopril  5 mg  Start taking Olmesartan 20 mg one tablet daily     *If you need a refill on your cardiac medications before your next appointment, please call your pharmacy*   Lab Work:  not needed   Testing/Procedures: Not needed   Follow-Up: At [facility], you and your health needs are our priority.  As part of our continuing mission to provide you with exceptional heart care, we have created designated Provider Care Teams.  These Care Teams include your primary Cardiologist (physician) and Advanced Practice Providers (APPs -  Physician Assistants and Nurse Practitioners) who all work together to provide you with the care you need, when you need it.     Your next appointment:   6 month(s)  The format for your next appointment:   In Person  Provider:   Nicki Guadalajara, MD

## 2021-03-09 NOTE — Progress Notes (Signed)
Cardiology Office Note    Date:  03/09/2021   ID:  JO BOOZE, DOB 05-Jun-1961, MRN [id number]  PCP:  Barnie Mort, NP  Cardiologist:  Shelva Majestic, MD   No chief complaint on file.  11 month  evaluation with me  History of Present Illness:  Alice Bennett is a 60 y.o. female who I had seen when she was hospitalized in September 2018.  She has a history of known CAD and in 2014 underwent bare-metal stenting of her mid LAD with a 3.0 x 15 mm MultiLink vision stent at [facility].  She presented to the hospital on July 09, 2017 with chest pain after tripping in a ditch.  An echocardiogram at [facility] showed an EF of 35 to 40% with hypokinesis of the mid apical, anteroseptal anterior inferolateral inferior inferoseptal myocardium with grade 2 diastolic dysfunction.  She was transferred to [facility] and underwent cardiac catheterization which showed 30% proximal RCA lesion, patent proximal LAD stent, 70% mid LAD lesion with FFR of 0.79 treated with a DES stent and there was 50% proximal to mid circumflex stenosis.  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  There was an initial attempt of adding spironolactone but this was discontinued due to increasing creatinine.  She apparently was seen on July 27, 2017 in the office by Almyra Deforest, PA for follow-up evaluation she was on aspirin and Plavix had not developed any recurrent symptomatology.  She was also on lisinopril and due to low blood pressure this was reduced to 2.5 mg daily.  She also has a history of diabetes mellitus, hyperlipidemia.  She has not been seen in our by our group in over 2 years.  She apparently recently saw Katheran Awe, NP who advised cardiology follow-up evaluation.  I saw her in June 2020 at which time she denied exertional chest pain.  She  admitted to rare episodes of sharp twinges of chest discomfort.  She was on atorvastatin 80 mg for hyperlipidemia.  She continued to be on dual  antiplatelet therapy with aspirin and Plavix.  She was on spironolactone 12.5 mg daily and lisinopril 2.5 mg in addition to carvedilol 6.25 mg twice a day with her LV dysfunction.  She is diabetic on Farxiga and metformin, pantoprazole for GERD and continues to be on Lyrica for peripheral neuropathy.    I last saw her on May 06, 2020 and over the prior year she continued to remain stable.  She was experiencing rare nonexertional chest pain in the costochondral region which was short-lived and not felt to be ischemic in etiology.   She was evaluated by Almyra Deforest, PA on January 06, 2021 that time had experienced 2 short-lived episodes of chest discomfort which was somewhat different than her costochondral discomfort.  Her symptoms were not exertionally precipitated and lasted approximately 15 minutes before resolving.  She was referred for a Lexiscan Myoview study which was low risk and showed a small mild defect in the basal inferoseptal and mid inferoseptal location that was nonreversible.  EF was 62%.  Since I last saw her, she has been on a medical regimen of carvedilol 6.25 mg twice a day spironolactone 12.5 mg daily in addition to lisinopril 5 mg for hypertension.  She continues to be on atorvastatin 80 mg, lovaza 2 capsules twice a day and Zetia 10 mg for mixed hyperlipidemia with target LDL less than 70.  She is diabetic on metformin 500 mg twice a day  and Farxiga 10 mg daily.  She has had some GI issues and is on pantoprazole 40 mg.  Recently she has had some abdominal bloating.  She apparently is scheduled to see a gastroenterologist in [place].  She has been followed by Dr. Heide Scales.  She does admit to a dry cough.  She has not been successful with weight loss.  She presents for evaluation.  Past Medical History:  Diagnosis Date  . CAD (coronary artery disease)    07/10/17 PCI/DES to LAD, EF 35-40%  . Diabetes mellitus without complication ([place])   . Hyperlipidemia   . Hypertension   . NSTEMI  (non-ST elevated myocardial infarction) [facility])     Past Surgical History:  Procedure Laterality Date  . CORONARY STENT INTERVENTION N/A 07/10/2017   Procedure: CORONARY STENT INTERVENTION;  Surgeon: Martinique, Peter M, MD;  Location: [place] [facility];  Service: Cardiovascular;  Laterality: N/A;  . INTRAVASCULAR PRESSURE WIRE/FFR STUDY N/A 07/10/2017   Procedure: INTRAVASCULAR PRESSURE WIRE/FFR STUDY;  Surgeon: Martinique, Peter M, MD;  Location: [place] [facility];  Service: Cardiovascular;  Laterality: N/A;  . LEFT HEART CATH AND CORONARY ANGIOGRAPHY N/A 07/10/2017   Procedure: LEFT HEART CATH AND CORONARY ANGIOGRAPHY;  Surgeon: Martinique, Peter M, MD;  Location: [place] [facility];  Service: Cardiovascular;  Laterality: N/A;    Current Medications: Outpatient Medications Prior to Visit  Medication Sig Dispense Refill  . aspirin EC 81 MG tablet Take 81 mg by mouth daily.    Marland Kitchen atorvastatin (LIPITOR) 80 MG tablet Take 1 tablet (80 mg total) by mouth daily at 6 PM. 30 tablet 0  . carvedilol (COREG) 6.25 MG tablet Take 1 tablet (6.25 mg total) by mouth 2 (two) times daily with a meal. 60 tablet 0  . clonazePAM (KLONOPIN) 1 MG tablet Take 1 mg by mouth 2 (two) times daily.    . clopidogrel (PLAVIX) 75 MG tablet Take 1 tablet (75 mg total) by mouth daily. 30 tablet 0  . ezetimibe (ZETIA) 10 MG tablet Take 1 tablet by mouth once daily 30 tablet 0  . FARXIGA 10 MG TABS tablet Take 10 mg by mouth daily.     Marland Kitchen lisinopril (ZESTRIL) 5 MG tablet Take 1 tablet (5 mg total) by mouth daily. 90 tablet 1  . metFORMIN (GLUCOPHAGE-XR) 500 MG 24 hr tablet Take 500 mg by mouth 2 (two) times daily.    . Multiple Vitamin (MULTI-VITAMIN) tablet Take by mouth.    . nitroGLYCERIN (NITROSTAT) 0.4 MG SL tablet Place 0.4 mg under the tongue as needed for chest pain.    Marland Kitchen omega-3 acid ethyl esters (LOVAZA) 1 g capsule Take 2 capsules (2 g total) by mouth 2 (two) times daily. 360 capsule 3  . pantoprazole (PROTONIX) 40 MG tablet Take  1 tablet (40 mg total) by mouth daily. 30 tablet 0  . pregabalin (LYRICA) 150 MG capsule Take 150 mg by mouth 2 (two) times daily.    . sertraline (ZOLOFT) 100 MG tablet Take 150 mg by mouth daily.     . TRULICITY 2.77 OE/4.2PN SOPN Inject 0.75 mg into the skin once a week.    . spironolactone (ALDACTONE) 25 MG tablet Take 0.5 tablets (12.5 mg total) by mouth daily. May take extra 0.5 tablet as needed if swelling occurs. 30 tablet 6   No facility-administered medications prior to visit.     Allergies:   Codeine   Social History   Socioeconomic History  . Marital status: Married  Spouse name: Not on file  . Number of children: Not on file  . Years of education: Not on file  . Highest education level: Not on file  Occupational History  . Not on file  Tobacco Use  . Smoking status: Current Every Day Smoker    Packs/day: 0.50    Years: 10.00    Pack years: 5.00    Types: Cigarettes  . Smokeless tobacco: Never Used  Vaping Use  . Vaping Use: Never used  Substance and Sexual Activity  . Alcohol use: No  . Drug use: No  . Sexual activity: Not on file  Other Topics Concern  . Not on file  Social History Narrative  . Not on file   Social Determinants of Health   Financial Resource Strain: Not on file  Food Insecurity: Not on file  Transportation Needs: Not on file  Physical Activity: Not on file  Stress: Not on file  Social Connections: Not on file     Family History:  The patient's family history is not on file.   ROS General: Negative; No fevers, chills, or night sweats;  HEENT: Negative; No changes in vision or hearing, sinus congestion, difficulty swallowing Pulmonary: Negative; No cough, wheezing, shortness of breath, hemoptysis Cardiovascular: Negative; No chest pain, presyncope, syncope, palpitations GI: GERD; abdominal bloating GU: Negative; No dysuria, hematuria, or difficulty voiding Musculoskeletal: Negative; no myalgias, joint pain, or  weakness Hematologic/Oncology: Negative; no easy bruising, bleeding Endocrine: Positive for diabetes Neuro: Positive for peripheral neuropathy Skin: Negative; No rashes or skin lesions Psychiatric: Negative; No behavioral problems, depression Sleep: Negative; No snoring, daytime sleepiness, hypersomnolence, bruxism, restless legs, hypnogognic hallucinations, no cataplexy Other comprehensive 14 point system review is negative.   PHYSICAL EXAM:   VS:  BP (!) 150/86   Pulse (!) 59   Ht _0  (1.753 m)   Wt 243 lb 3.2 oz (110.3 kg)   SpO2 97%   BMI 35.91 kg/m     Blood pressure by me was 150/86  Wt Readings from Last 3 Encounters:  03/09/21 243 lb 3.2 oz (110.3 kg)  01/19/21 244 lb (110.7 kg)  01/06/21 244 lb (110.7 kg)   General: Alert, oriented, no distress.  Skin: normal turgor, no rashes, warm and dry HEENT: Normocephalic, atraumatic. Pupils equal round and reactive to light; sclera anicteric; extraocular muscles intact;  Nose without nasal septal hypertrophy Mouth/Parynx benign; Mallinpatti scale 3 Neck: No JVD, no carotid bruits; normal carotid upstroke Lungs: clear to ausculatation and percussion; no wheezing or rales Chest wall: without tenderness to palpitation Heart: PMI not displaced, RRR, s1 s2 normal, 1/6 systolic murmur, no diastolic murmur, no rubs, gallops, thrills, or heaves Abdomen: central adiposity; soft, nontender; no hepatosplenomehaly, BS+; abdominal aorta nontender and not dilated by palpation. Back: no CVA tenderness Pulses 2+ Musculoskeletal: full range of motion, normal strength, no joint deformities Extremities: no clubbing cyanosis or edema, Homan's sign negative  Neurologic: grossly nonfocal; Cranial nerves grossly wnl Psychologic: Normal mood and affect   Studies/Labs Reviewed:   EKG:  EKG is ordered today.  ECG (independently read by me): NSR at 63; no ectopy, normal intervals  April 30, 2019 ECG (independently read by me): NSR at 60;  resolution of prior anterior T wave inversion since 07/28/2017  Recent Labs: BMP Latest Ref Rng & Units 06/09/2020 05/30/2020 07/27/2017  Glucose 70 - 99 mg/dL 160(H) 116(H) 97  BUN 6 - 20 mg/dL _1 Creatinine 0.44 - 1.00 mg/dL 1.28(H) 1.21(H) 1.05(H)  BUN/Creat Ratio 9 - 23 - 12 8(L)  Sodium 135 - 145 mmol/L 137 143 143  Potassium 3.5 - 5.1 mmol/L 3.5 5.0 3.9  Chloride 98 - 111 mmol/L 103 104 107(H)  CO2 22 - 32 mmol/L _0 Calcium 8.9 - 10.3 mg/dL 9.2 9.5 9.2     Hepatic Function Latest Ref Rng & Units 06/09/2020 05/30/2020 07/09/2017  Total Protein 6.5 - 8.1 g/dL 7.4 7.5 7.1  Albumin 3.5 - 5.0 g/dL 3.6 4.5 3.4(L)  AST 15 - 41 U/L 14(L) 10 20  ALT 0 - 44 U/L _1 Alk Phosphatase 38 - 126 U/L 73 97 53  Total Bilirubin 0.3 - 1.2 mg/dL 0.7 0.3 0.3    CBC Latest Ref Rng & Units 06/09/2020 05/30/2020 07/11/2017  WBC 4.0 - 10.5 K/uL 11.3(H) 7.9 12.1(H)  Hemoglobin 12.0 - 15.0 g/dL 13.5 14.7 14.0  Hematocrit 36.0 - 46.0 % 42.6 45.5 42.9  Platelets 150 - 400 K/uL 226 217 220   Lab Results  Component Value Date   MCV 88.8 06/09/2020   MCV 88 05/30/2020   MCV 87.2 07/11/2017   Lab Results  Component Value Date   TSH 2.980 05/30/2020   Lab Results  Component Value Date   HGBA1C 7.4 (H) 05/30/2020     BNP No results found for: BNP  ProBNP No results found for: PROBNP   Lipid Panel     Component Value Date/Time   CHOL 207 (H) 05/30/2020 1223   TRIG 277 (H) 05/30/2020 1223   HDL 35 (L) 05/30/2020 1223   CHOLHDL 5.9 (H) 05/30/2020 1223   LDLCALC 123 (H) 05/30/2020 1223     RADIOLOGY: No results found.   Additional studies/ records that were reviewed today include:  I reviewed her records from her hospitalization in September 2018 including catheterization report, echo Doppler data, and subsequent evaluation with Almyra Deforest, PA-C   Prox RCA lesion, 35 %stenosed.  Prox LAD lesion, 0 %stenosed.  Prox Cx to Mid Cx lesion, 50 %stenosed.  A STENT SIERRA 3.00  X 15 MM drug eluting stent was successfully placed, and does not overlap previously placed stent.  Mid LAD lesion, 70 %stenosed.  Post intervention, there is a 0% residual stenosis.  There is moderate left ventricular systolic dysfunction.  The left ventricular ejection fraction is 35-45% by visual estimate.  LV end diastolic pressure is severely elevated.   1. Single vessel obstructive CAD with 70% mid LAD. FFR 0.79. Stent in proximal LAD is patent 2. Severe LV dysfunction. Wall motion abnormality is more consistent with Takotsubo pattern 3. Markedly elevated LVEDP 4. Successful stenting of the mid LAD with DES.  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 The mid LAD stenosis was not critical and is unlikely to have caused these findings. It was borderline obstructive with abnormal FFR so we did proceed with stenting of this lesion. Will continue DAPT for one year. IV diuresis. Optimize BP control with beta blocker and ACEi.      ASSESSMENT:    No diagnosis found.  PLAN:   1.  CAD: In 2014 she underwent bare-metal stenting of her LAD at [facility].  On July 09, 2017 she underwent stenting of her mid LAD a patent proximal LAD stent and 50% circumflex and 35% RCA stenoses.  At present, she is not having any recurrent anginal symptoms continues to be on DAPT with aspirin/Plavix, carvedilol 6.25 mg twice a day, lisinopril  5 mg and spironolactone 12.5 mg daily.   2.  Takotsubo cardiomyopathy: Her clinical presentation, ECG findings, and LV abnormality were felt to be due to the syndrome in September 2018.  ECG reveals resolution of prior T wave abnormalities.  Her EF in September 2018 was 35 to 40%.  On repeat echocardiography on May 30, 2020 EF had improved to 60 to 65%.  There was mild LVH of the basal septal segment, grade 1 diastolic dysfunction and mild aortic Insufficiency.  3.  Atypical chest pain:  She has a history of musculoskeletal chest pain and costochondral tenderness on palpation of her chest wall.  She was reevaluated by Almyra Deforest on January 06, 2021.  She was referred for nuclear perfusion study which was performed on January 19, 2021.  This was a low risk study with a nuclear stress EF at 62%.  There were no EKG changes.  There was a small fixed mild perfusion defect in the basal inferoseptal mid inferoseptal location without evidence for ischemia.  4.  Type 2 diabetes mellitus with complication: She continues to be on Farxiga 10 mg and metformin 500 mg twice a day.  5.  Essential hypertension: She has been on carvedilol 6.25 mg twice a day, lisinopril 5 mg, and spironolactone 12.5 mg daily.  Blood pressure when taken by me today was elevated at 150/86.  She also has noticed a dry cough which most likely is contributed by lisinopril.  I have recommended discontinuance of lisinopril.  I will change her to olmesartan 20 mg which is covered by her insurance.  Ideal blood pressure is less than 120/80.  6.  Hyperlipidemia: LDL goal less than 70, she is on atorvastatin 80 mg.  Laboratory has been checked by her primary provider  7.  Peripheral neuropathy: She continues to be on pregabalin.  8.  GERD: Stable on pantoprazole    Medication Adjustments/Labs and Tests Ordered: Current medicines are reviewed at length with the patient today.  Concerns regarding medicines are outlined above.  Medication changes, Labs and Tests ordered today are listed in the Patient Instructions below. There are no Patient Instructions on file for this visit.   Signed, Shelva Majestic, MD  03/09/2021 3:47 PM    [facility] [address] [place], [place], [place]  [address] Phone: [phone]

## 2021-03-10 ENCOUNTER — Encounter: Payer: Self-pay | Admitting: Cardiovascular Disease

## 2021-09-10 ENCOUNTER — Other Ambulatory Visit: Payer: Self-pay | Admitting: Cardiovascular Disease

## 2022-05-30 ENCOUNTER — Emergency Department (HOSPITAL_COMMUNITY): Payer: Medicaid Other

## 2022-05-30 ENCOUNTER — Other Ambulatory Visit: Payer: Self-pay

## 2022-05-30 ENCOUNTER — Encounter (HOSPITAL_COMMUNITY): Payer: Self-pay | Admitting: *Deleted

## 2022-05-30 ENCOUNTER — Inpatient Hospital Stay (HOSPITAL_COMMUNITY)
Admission: EM | Admit: 2022-05-30 | Discharge: 2022-06-01 | DRG: 247 | Disposition: A | Discharge status: Home / Self Care | Payer: Medicaid Other | Attending: Internal Medicine | Admitting: Internal Medicine

## 2022-05-30 DIAGNOSIS — I10 Essential (primary) hypertension: Secondary | ICD-10-CM | POA: Diagnosis present

## 2022-05-30 DIAGNOSIS — E1142 Type 2 diabetes mellitus with diabetic polyneuropathy: Secondary | ICD-10-CM | POA: Diagnosis present

## 2022-05-30 DIAGNOSIS — I5181 Takotsubo syndrome: Secondary | ICD-10-CM | POA: Diagnosis present

## 2022-05-30 DIAGNOSIS — Z7982 Long term (current) use of aspirin: Secondary | ICD-10-CM | POA: Diagnosis not present

## 2022-05-30 DIAGNOSIS — R079 Chest pain, unspecified: Secondary | ICD-10-CM | POA: Diagnosis not present

## 2022-05-30 DIAGNOSIS — Z79899 Other long term (current) drug therapy: Secondary | ICD-10-CM

## 2022-05-30 DIAGNOSIS — I152 Hypertension secondary to endocrine disorders: Secondary | ICD-10-CM | POA: Diagnosis present

## 2022-05-30 DIAGNOSIS — E876 Hypokalemia: Secondary | ICD-10-CM | POA: Diagnosis present

## 2022-05-30 DIAGNOSIS — E1169 Type 2 diabetes mellitus with other specified complication: Secondary | ICD-10-CM

## 2022-05-30 DIAGNOSIS — K219 Gastro-esophageal reflux disease without esophagitis: Secondary | ICD-10-CM | POA: Diagnosis present

## 2022-05-30 DIAGNOSIS — I252 Old myocardial infarction: Secondary | ICD-10-CM | POA: Diagnosis not present

## 2022-05-30 DIAGNOSIS — Z955 Presence of coronary angioplasty implant and graft: Secondary | ICD-10-CM

## 2022-05-30 DIAGNOSIS — I2510 Atherosclerotic heart disease of native coronary artery without angina pectoris: Secondary | ICD-10-CM | POA: Diagnosis present

## 2022-05-30 DIAGNOSIS — F32A Depression, unspecified: Secondary | ICD-10-CM | POA: Diagnosis present

## 2022-05-30 DIAGNOSIS — F17210 Nicotine dependence, cigarettes, uncomplicated: Secondary | ICD-10-CM | POA: Diagnosis present

## 2022-05-30 DIAGNOSIS — I249 Acute ischemic heart disease, unspecified: Principal | ICD-10-CM

## 2022-05-30 DIAGNOSIS — Z7984 Long term (current) use of oral hypoglycemic drugs: Secondary | ICD-10-CM

## 2022-05-30 DIAGNOSIS — Z7902 Long term (current) use of antithrombotics/antiplatelets: Secondary | ICD-10-CM

## 2022-05-30 DIAGNOSIS — E785 Hyperlipidemia, unspecified: Secondary | ICD-10-CM | POA: Diagnosis present

## 2022-05-30 DIAGNOSIS — Z720 Tobacco use: Secondary | ICD-10-CM | POA: Diagnosis not present

## 2022-05-30 DIAGNOSIS — I214 Non-ST elevation (NSTEMI) myocardial infarction: Principal | ICD-10-CM | POA: Diagnosis present

## 2022-05-30 LAB — BASIC METABOLIC PANEL
Anion gap: 4 — ABNORMAL LOW (ref 5–15)
BUN: 11 mg/dL (ref 6–20)
CO2: 28 mmol/L (ref 22–32)
Calcium: 8.7 mg/dL — ABNORMAL LOW (ref 8.9–10.3)
Chloride: 108 mmol/L (ref 98–111)
Creatinine, Ser: 0.83 mg/dL (ref 0.44–1.00)
GFR, Estimated: >60 mL/min (ref >60)
Glucose, Bld: 234 mg/dL — ABNORMAL HIGH (ref 70–99)
Potassium: 4.1 mmol/L (ref 3.5–5.1)
Sodium: 140 mmol/L (ref 135–145)

## 2022-05-30 LAB — TROPONIN I (HIGH SENSITIVITY)
Troponin I (High Sensitivity): 577 ng/L (ref <18)
Troponin I (High Sensitivity): 1576 ng/L (ref <18)

## 2022-05-30 LAB — CBC
HCT: 42.6 % (ref 36.0–46.0)
Hemoglobin: 13.7 g/dL (ref 12.0–15.0)
MCH: 28.5 pg (ref 26.0–34.0)
MCHC: 32.2 g/dL (ref 30.0–36.0)
MCV: 88.8 fL (ref 80.0–100.0)
Platelets: 179 10*3/uL (ref 150–400)
RBC: 4.8 MIL/uL (ref 3.87–5.11)
RDW: 15 % (ref 11.5–15.5)
WBC: 9 10*3/uL (ref 4.0–10.5)
nRBC: 0 % (ref 0.0–0.2)

## 2022-05-30 LAB — GLUCOSE, CAPILLARY: Glucose-Capillary: 81 mg/dL (ref 70–99)

## 2022-05-30 LAB — HEMOGLOBIN A1C
Hgb A1c MFr Bld: 6.5 % — ABNORMAL HIGH (ref 4.8–5.6)
Mean Plasma Glucose: 139.85 mg/dL

## 2022-05-30 MED ORDER — SERTRALINE HCL 100 MG PO TABS
100.0000 mg | ORAL_TABLET | Freq: Every day | ORAL | Status: DC
  Administered 2022-05-30 – 2022-06-01 (×3): 100 mg via ORAL
  Filled 2022-05-30 (×3): qty 1

## 2022-05-30 MED ORDER — LORAZEPAM 1 MG PO TABS
1.0000 mg | ORAL_TABLET | Freq: Once | ORAL | Status: AC
  Administered 2022-05-30: 1 mg via ORAL
  Filled 2022-05-30: qty 1

## 2022-05-30 MED ORDER — ONDANSETRON HCL 4 MG/2ML IJ SOLN: 4.0000 mg | Freq: Four times a day (QID) | INTRAMUSCULAR | Status: DC | PRN

## 2022-05-30 MED ORDER — ASPIRIN 81 MG PO CHEW
324.0000 mg | CHEWABLE_TABLET | Freq: Once | ORAL | Status: AC
  Administered 2022-05-30: 324 mg via ORAL
  Filled 2022-05-30: qty 4

## 2022-05-30 MED ORDER — PNEUMOCOCCAL VAC POLYVALENT 25 MCG/0.5ML IJ INJ: 0.5000 mL | INJECTION | INTRAMUSCULAR | Status: DC | PRN

## 2022-05-30 MED ORDER — ORAL CARE MOUTH RINSE: 15.0000 mL | OROMUCOSAL | Status: DC | PRN

## 2022-05-30 MED ORDER — INSULIN ASPART 100 UNIT/ML IJ SOLN
6.0000 [IU] | Freq: Three times a day (TID) | INTRAMUSCULAR | Status: DC
  Administered 2022-05-31 – 2022-06-01 (×2): 6 [IU] via SUBCUTANEOUS
  Filled 2022-05-30: qty 0.06

## 2022-05-30 MED ORDER — NITROGLYCERIN IN D5W 200-5 MCG/ML-% IV SOLN
0.0000 ug/min | INTRAVENOUS | Status: DC
  Administered 2022-05-30: 5 ug/min via INTRAVENOUS
  Filled 2022-05-30: qty 250

## 2022-05-30 MED ORDER — NITROGLYCERIN 0.4 MG SL SUBL: 0.4000 mg | SUBLINGUAL_TABLET | SUBLINGUAL | Status: DC | PRN

## 2022-05-30 MED ORDER — EZETIMIBE 10 MG PO TABS
10.0000 mg | ORAL_TABLET | Freq: Every day | ORAL | Status: DC
  Administered 2022-05-30 – 2022-06-01 (×3): 10 mg via ORAL
  Filled 2022-05-30 (×3): qty 1

## 2022-05-30 MED ORDER — SODIUM CHLORIDE 0.9 % IV SOLN: 250.0000 mL | INTRAVENOUS | Status: DC | PRN

## 2022-05-30 MED ORDER — ASPIRIN 81 MG PO CHEW
81.0000 mg | CHEWABLE_TABLET | ORAL | Status: AC
  Administered 2022-05-31: 81 mg via ORAL
  Filled 2022-05-30: qty 1

## 2022-05-30 MED ORDER — CLOPIDOGREL BISULFATE 75 MG PO TABS
75.0000 mg | ORAL_TABLET | Freq: Every day | ORAL | Status: DC
Start: 2022-05-30 — End: 2022-06-01
  Administered 2022-05-30 – 2022-06-01 (×3): 75 mg via ORAL
  Filled 2022-05-30 (×3): qty 1

## 2022-05-30 MED ORDER — OMEGA-3-ACID ETHYL ESTERS 1 G PO CAPS
2.0000 g | ORAL_CAPSULE | Freq: Two times a day (BID) | ORAL | Status: DC
  Administered 2022-05-30: 2 g via ORAL
  Filled 2022-05-30 (×4): qty 2

## 2022-05-30 MED ORDER — SODIUM CHLORIDE 0.9% FLUSH
3.0000 mL | INTRAVENOUS | Status: DC | PRN
Start: 2022-05-30 — End: 2022-05-31

## 2022-05-30 MED ORDER — ATORVASTATIN CALCIUM 80 MG PO TABS
80.0000 mg | ORAL_TABLET | Freq: Every day | ORAL | Status: DC
  Administered 2022-05-30 – 2022-05-31 (×2): 80 mg via ORAL
  Filled 2022-05-30 (×2): qty 1

## 2022-05-30 MED ORDER — ACETAMINOPHEN 325 MG PO TABS
650.0000 mg | ORAL_TABLET | ORAL | Status: DC | PRN
  Administered 2022-05-31: 650 mg via ORAL
  Filled 2022-05-30: qty 2

## 2022-05-30 MED ORDER — IRBESARTAN 150 MG PO TABS
150.0000 mg | ORAL_TABLET | Freq: Every day | ORAL | Status: DC
  Administered 2022-05-30 – 2022-06-01 (×3): 150 mg via ORAL
  Filled 2022-05-30 (×3): qty 1

## 2022-05-30 MED ORDER — PANTOPRAZOLE SODIUM 40 MG PO TBEC
40.0000 mg | DELAYED_RELEASE_TABLET | Freq: Every day | ORAL | Status: DC
Start: 2022-05-30 — End: 2022-06-01
  Administered 2022-05-30 – 2022-06-01 (×3): 40 mg via ORAL
  Filled 2022-05-30 (×3): qty 1

## 2022-05-30 MED ORDER — SODIUM CHLORIDE 0.9% FLUSH
3.0000 mL | Freq: Two times a day (BID) | INTRAVENOUS | Status: DC
  Administered 2022-05-30 – 2022-05-31 (×2): 3 mL via INTRAVENOUS

## 2022-05-30 MED ORDER — SODIUM CHLORIDE 0.9 % WEIGHT BASED INFUSION
1.0000 mL/kg/h | INTRAVENOUS | Status: DC
Start: 2022-05-31 — End: 2022-05-31

## 2022-05-30 MED ORDER — ASPIRIN 81 MG PO TBEC
81.0000 mg | DELAYED_RELEASE_TABLET | Freq: Every day | ORAL | Status: DC
  Administered 2022-05-30 – 2022-06-01 (×2): 81 mg via ORAL
  Filled 2022-05-30 (×2): qty 1

## 2022-05-30 MED ORDER — HEPARIN (PORCINE) 25000 UT/250ML-% IV SOLN
1500.0000 [IU]/h | INTRAVENOUS | Status: DC
  Administered 2022-05-30: 1050 [IU]/h via INTRAVENOUS
  Administered 2022-05-31: 1500 [IU]/h via INTRAVENOUS
  Filled 2022-05-30 (×2): qty 250

## 2022-05-30 MED ORDER — CARVEDILOL 6.25 MG PO TABS
6.2500 mg | ORAL_TABLET | Freq: Two times a day (BID) | ORAL | Status: DC
Start: 2022-05-30 — End: 2022-06-01
  Administered 2022-05-30 – 2022-06-01 (×4): 6.25 mg via ORAL
  Filled 2022-05-30 (×4): qty 1

## 2022-05-30 MED ORDER — DAPAGLIFLOZIN PROPANEDIOL 10 MG PO TABS
10.0000 mg | ORAL_TABLET | Freq: Every day | ORAL | Status: DC
  Administered 2022-05-30 – 2022-06-01 (×3): 10 mg via ORAL
  Filled 2022-05-30 (×3): qty 1

## 2022-05-30 MED ORDER — SODIUM CHLORIDE 0.9 % WEIGHT BASED INFUSION
3.0000 mL/kg/h | INTRAVENOUS | Status: DC
Start: 2022-05-31 — End: 2022-05-31
  Administered 2022-05-31: 3 mL/kg/h via INTRAVENOUS

## 2022-05-30 MED ORDER — HEPARIN BOLUS VIA INFUSION
4000.0000 [IU] | Freq: Once | INTRAVENOUS | Status: AC
Start: 2022-05-30 — End: 2022-05-30
  Administered 2022-05-30: 4000 [IU] via INTRAVENOUS
  Filled 2022-05-30: qty 4000

## 2022-05-30 MED ORDER — INSULIN ASPART 100 UNIT/ML IJ SOLN
0.0000 [IU] | Freq: Three times a day (TID) | INTRAMUSCULAR | Status: DC
  Administered 2022-05-31 (×2): 3 [IU] via SUBCUTANEOUS
  Administered 2022-05-31 – 2022-06-01 (×2): 4 [IU] via SUBCUTANEOUS
  Filled 2022-05-30: qty 0.2

## 2022-05-30 MED ORDER — SPIRONOLACTONE 12.5 MG HALF TABLET
12.5000 mg | ORAL_TABLET | Freq: Every day | ORAL | Status: DC
Start: 2022-05-30 — End: 2022-06-01
  Administered 2022-05-30 – 2022-06-01 (×2): 12.5 mg via ORAL
  Filled 2022-05-30 (×2): qty 1

## 2022-05-30 NOTE — ED Triage Notes (Signed)
Per EMS, patient from home, c/o chest pain since 1100 yesterday after receiving news that brother had died. States today at 1130 pain significantly worsened. 2 nitro without relief. Hx MI.   BP 164/90 HR 60 CBG 109 96% RA

## 2022-05-30 NOTE — Plan of Care (Signed)
Patient admitted to 6E from [facility] ED with a diagnosis of "NSTEMI". Patient is receiving infusions of Nitro and heparin. Patient asked this RN multiple times if her "daughter could bring her something from [employer]'' because she wanted to "eat before midnight". This Rn provided verbal teaching to the patient regarding dietary choices, weight loss, and smoking cessation, with regards to her current diagnosis, and for the prevention of further cardiac tissue damage. Patient still endorses only mild chest pressure at the time of writing.   Problem: Education: Goal: Knowledge of General Education information will improve Description: Including pain rating scale, medication(s)/side effects and non-pharmacologic comfort measures Outcome: Progressing   Problem: Health Behavior/Discharge Planning: Goal: Ability to manage health-related needs will improve Outcome: Progressing   Problem: Clinical Measurements: Goal: Ability to maintain clinical measurements within normal limits will improve Outcome: Progressing Goal: Will remain free from infection Outcome: Progressing Goal: Diagnostic test results will improve Outcome: Progressing Goal: Respiratory complications will improve Outcome: Progressing Goal: Cardiovascular complication will be avoided Outcome: Progressing   Problem: Activity: Goal: Risk for activity intolerance will decrease Outcome: Progressing   Problem: Nutrition: Goal: Adequate nutrition will be maintained Outcome: Progressing   Problem: Coping: Goal: Level of anxiety will decrease Outcome: Progressing   Problem: Elimination: Goal: Will not experience complications related to bowel motility Outcome: Progressing Goal: Will not experience complications related to urinary retention Outcome: Progressing   Problem: Pain Managment: Goal: General experience of comfort will improve Outcome: Progressing   Problem: Safety: Goal: Ability to remain free from injury will  improve Outcome: Progressing   Problem: Skin Integrity: Goal: Risk for impaired skin integrity will decrease Outcome: Progressing   Problem: Education: Goal: Understanding of cardiac disease, CV risk reduction, and recovery process will improve Outcome: Progressing Goal: Individualized Educational Video(s) Outcome: Progressing   Problem: Activity: Goal: Ability to tolerate increased activity will improve Outcome: Progressing   Problem: Cardiac: Goal: Ability to achieve and maintain adequate cardiovascular perfusion will improve Outcome: Progressing   Problem: Health Behavior/Discharge Planning: Goal: Ability to safely manage health-related needs after discharge will improve Outcome: Progressing   Problem: Education: Goal: Ability to describe self-care measures that may prevent or decrease complications (Diabetes Survival Skills Education) will improve Outcome: Progressing Goal: Individualized Educational Video(s) Outcome: Progressing   Problem: Coping: Goal: Ability to adjust to condition or change in health will improve Outcome: Progressing   Problem: Fluid Volume: Goal: Ability to maintain a balanced intake and output will improve Outcome: Progressing   Problem: Health Behavior/Discharge Planning: Goal: Ability to identify and utilize available resources and services will improve Outcome: Progressing Goal: Ability to manage health-related needs will improve Outcome: Progressing   Problem: Metabolic: Goal: Ability to maintain appropriate glucose levels will improve Outcome: Progressing   Problem: Nutritional: Goal: Maintenance of adequate nutrition will improve Outcome: Progressing Goal: Progress toward achieving an optimal weight will improve Outcome: Progressing   Problem: Skin Integrity: Goal: Risk for impaired skin integrity will decrease Outcome: Progressing   Problem: Tissue Perfusion: Goal: Adequacy of tissue perfusion will improve Outcome:  Progressing   Problem: Education: Goal: Understanding of CV disease, CV risk reduction, and recovery process will improve Outcome: Progressing Goal: Individualized Educational Video(s) Outcome: Progressing   Problem: Activity: Goal: Ability to return to baseline activity level will improve Outcome: Progressing   Problem: Cardiovascular: Goal: Ability to achieve and maintain adequate cardiovascular perfusion will improve Outcome: Progressing Goal: Vascular access site(s) Level 0-1 will be maintained Outcome: Progressing   Problem: Health Behavior/Discharge Planning: Goal: Ability  to safely manage health-related needs after discharge will improve Outcome: Progressing

## 2022-05-30 NOTE — ED Triage Notes (Signed)
Patient states she has 4 baby aspirins today. Patient reports chest pain is mostly in the middle of chest but reports pain to her left arm.   States the nitroglycerin didn't help.

## 2022-05-30 NOTE — H&P (Signed)
Cardiology Admission History and Physical:   Patient ID: Alice Bennett MRN: [id number]; DOB: 12-23-1960   Admission date: 05/30/2022  PCP:  Retia Passe, NP   [facility] Providers Cardiologist:  Nicki Guadalajara, MD        Chief Complaint:  NSTEMI  Patient Profile:   Alice Bennett is a 61 y.o. female with CAD with Prior NSTEMI (PCI in 2014 pLAD, mLAD PCI 2018 ) with non obstructive LCX and RCA, EF 35-40%, Mild AI and tobacco abuse, HTN with DM, HLD  with DM with chart history of Takotsubo Cardiomyopathy and LV recover in 2021 (despite only LAD PCI) who is being seen 05/30/2022 for the evaluation of NSTEMI after death of mother.  History of Present Illness:   Alice Bennett notes that she is feeling fine on the nitroglycerin drip.    Was last feeling well in 2022.  Had septal defect in her NM Stress, at that time, and started SGLT2i lovaza and zetia and her pain, though to be MSK pain has resolved.  Baseline activity includes being fairly sedentary, spends time with the grandkids.  Still smoking.  Per patient, brother died two days ago.  At that time had chest pain and chest pressure.  This is better that prior: prior to last PCI felt like there was an elephant on her chest.  For the past few days, she has had a ton of bricks on her chest instead.  Improved with nitroglycerin.    No shortness of breath, DOE .  No PND or orthopnea.  No bendopnea, weight gain (or weight loss- just stable), leg swelling , or abdominal swelling.  No syncope or near syncope .   Notes  no palpitations or funny heart beats.     Inpatient evaluation because of worsening chest pain.  In the ED/OSH patient received medications including ASA (full dose), heparin, ativan X1, nitro drip and had resolution of her symptoms..  Key labs notable for glucose 234, first hs troponin 577.  Key imaging includes CXR without effusions.  Cardiology called for evaluation. EKG: SR: 67 subtle anterolateral t wave  flattening Tele: SR with rare PAC   Past Medical History:  Diagnosis Date   CAD (coronary artery disease)    07/10/17 PCI/DES to LAD, EF 35-40%   Diabetes mellitus without complication (HCC)    Hyperlipidemia    Hypertension    NSTEMI (non-ST elevated myocardial infarction) [facility])     Past Surgical History:  Procedure Laterality Date   CORONARY STENT INTERVENTION N/A 07/10/2017   Procedure: CORONARY STENT INTERVENTION;  Surgeon: Swaziland, Peter M, MD;  Location: [facility] INVASIVE CV LAB;  Service: Cardiovascular;  Laterality: N/A;   INTRAVASCULAR PRESSURE WIRE/FFR STUDY N/A 07/10/2017   Procedure: INTRAVASCULAR PRESSURE WIRE/FFR STUDY;  Surgeon: Swaziland, Peter M, MD;  Location: [facility] INVASIVE CV LAB;  Service: Cardiovascular;  Laterality: N/A;   LEFT HEART CATH AND CORONARY ANGIOGRAPHY N/A 07/10/2017   Procedure: LEFT HEART CATH AND CORONARY ANGIOGRAPHY;  Surgeon: Swaziland, Peter M, MD;  Location: [facility] INVASIVE CV LAB;  Service: Cardiovascular;  Laterality: N/A;     Medications Prior to Admission: Prior to Admission medications   Medication Sig Start Date End Date Taking? Authorizing Provider  carvedilol (COREG) 6.25 MG tablet Take 1 tablet (6.25 mg total) by mouth 2 (two) times daily with a meal. 07/19/17  Yes Lennette Bihari, MD  clonazePAM (KLONOPIN) 1 MG tablet Take 1 mg by mouth 2 (two) times daily.   Yes [provider]  clopidogrel (PLAVIX) 75 MG tablet Take 1 tablet (75 mg total) by mouth daily. 07/19/17  Yes Lennette Bihari, MD  OZEMPIC, 2 MG/DOSE, 8 MG/3ML SOPN Inject 2 mg into the skin once a week. 04/21/22  Yes [provider]  pregabalin (LYRICA) 150 MG capsule Take 150 mg by mouth 2 (two) times daily.   Yes [provider]  sertraline (ZOLOFT) 100 MG tablet Take 100 mg by mouth daily.   Yes [provider]  aspirin EC 81 MG tablet Take 81 mg by mouth daily.    [provider]  atorvastatin (LIPITOR) 80 MG tablet Take 1 tablet (80 mg total) by mouth  daily at 6 PM. 07/19/17   Lennette Bihari, MD  ezetimibe (ZETIA) 10 MG tablet Take 1 tablet by mouth once daily 01/02/21   Lennette Bihari, MD  FARXIGA 10 MG TABS tablet Take 10 mg by mouth daily.  03/28/19   [provider]  metFORMIN (GLUCOPHAGE-XR) 500 MG 24 hr tablet Take 500 mg by mouth 2 (two) times daily. 04/25/20   [provider]  Multiple Vitamin (MULTI-VITAMIN) tablet Take by mouth.    [provider]  nitroGLYCERIN (NITROSTAT) 0.4 MG SL tablet Place 0.4 mg under the tongue as needed for chest pain. 04/25/20   [provider]  olmesartan (BENICAR) 20 MG tablet Take 1 tablet (20 mg total) by mouth daily. 03/09/21   Lennette Bihari, MD  omega-3 acid ethyl esters (LOVAZA) 1 g capsule Take 2 capsules (2 g total) by mouth 2 (two) times daily. 07/10/20   Lennette Bihari, MD  pantoprazole (PROTONIX) 40 MG tablet Take 1 tablet (40 mg total) by mouth daily. 07/19/17   Lennette Bihari, MD  spironolactone (ALDACTONE) 25 MG tablet TAKE 1/2 (ONE-HALF) TABLET BY MOUTH ONCE DAILY. MAY TAKE EXTRA 1/2 TABLET AS NEEDED IF SWELLING OCCURS. 09/10/21   Lennette Bihari, MD  TRULICITY 0.75 MG/0.5ML SOPN Inject 0.75 mg into the skin once a week. 04/10/20   [provider]     Allergies:    Allergies  Allergen Reactions   Codeine Itching    Social History:   Social History   Socioeconomic History   Marital status: Married    Spouse name: Not on file   Number of children: Not on file   Years of education: Not on file   Highest education level: Not on file  Occupational History   Not on file  Tobacco Use   Smoking status: Every Day    Packs/day: 0.50    Years: 10.00    Total pack years: 5.00    Types: Cigarettes   Smokeless tobacco: Never  Vaping Use   Vaping Use: Never used  Substance and Sexual Activity   Alcohol use: No   Drug use: No   Sexual activity: Not on file  Other Topics Concern   Not on file  Social History Narrative   Not on file   Social  Determinants of Health   Financial Resource Strain: Not on file  Food Insecurity: Not on file  Transportation Needs: Not on file  Physical Activity: Not on file  Stress: Not on file  Social Connections: Not on file  Intimate Partner Violence: Not on file    Family History:   Both parents are deceased.  She has 3 brothers and one sister.  2 brothers have undergone CABG revascularization surgery. Has one sister, a daughter and two grandchildren  ROS:  Please see the  history of present illness.  All other ROS reviewed and negative.     Physical Exam/Data:   Vitals:   05/30/22 1434 05/30/22 1500 05/30/22 1536 05/30/22 1600  BP: 135/67 (!) 129/56 121/88 123/70  Pulse: 72 67 65 62  Resp: (!) 23 17 (!) 22 16  Temp:    97.6 F (36.4 C)  SpO2: 99% 99% 98% 99%  Weight:      Height:       No intake or output data in the 24 hours ending 05/30/22 1658    05/30/2022   12:21 PM 03/09/2021    3:01 PM 01/19/2021    1:14 PM  Last 3 Weights  Weight (lbs) 223 lb 243 lb 3.2 oz 244 lb  Weight (kg) 101.152 kg 110.315 kg 110.678 kg     Body mass index is 32.93 kg/m.  General:  Well nourished, well developed, in no acute distress, smells of smoke HEENT: normal Neck: no JVD Vascular: +2 radial pulses Cardiac:  normal S1, S2; RRR; no murmur  Lungs:  clear to auscultation bilaterally, no wheezing, rhonchi or rales  Abd: soft, nontender, no hepatomegaly  Ext: no edema Musculoskeletal:  No deformities, BUE and BLE strength normal and equal Skin: warm and dry  Neuro:  CNs 2-12 intact, no focal abnormalities noted Psych:  Normal affect    Laboratory Data:  High Sensitivity Troponin:   Recent Labs  Lab 05/30/22 1225  TROPONINIHS 577*      Chemistry Recent Labs  Lab 05/30/22 1225  NA 140  K 4.1  CL 108  CO2 28  GLUCOSE 234*  BUN 11  CREATININE 0.83  CALCIUM 8.7*  GFRNONAA >60  ANIONGAP 4*    No results for input(s): "PROT", "ALBUMIN", "AST", "ALT", "ALKPHOS", "BILITOT" in  the last 168 hours. Lipids No results for input(s): "CHOL", "TRIG", "HDL", "LABVLDL", "LDLCALC", "CHOLHDL" in the last 168 hours. Hematology Recent Labs  Lab 05/30/22 1225  WBC 9.0  RBC 4.80  HGB 13.7  HCT 42.6  MCV 88.8  MCH 28.5  MCHC 32.2  RDW 15.0  PLT 179   Thyroid No results for input(s): "TSH", "FREET4" in the last 168 hours. BNPNo results for input(s): "BNP", "PROBNP" in the last 168 hours.  DDimer No results for input(s): "DDIMER" in the last 168 hours.   Radiology/Studies:  DG Chest 2 View  Result Date: 05/30/2022 CLINICAL DATA:  Chest pain since yesterday. EXAM: CHEST - 2 VIEW COMPARISON:  02/17/2021 FINDINGS: Lungs are adequately inflated without focal airspace consolidation or effusion. Cardiomediastinal silhouette and remainder of the exam is unchanged. IMPRESSION: No active cardiopulmonary disease. Electronically Signed   By: Elberta Fortis M.D.   On: 05/30/2022 13:49     Assessment and Plan:   NSTEMI Coronary Artery Disease Complicated by history of Takotsubo Cardiomyopathy  Given risks factors, favor repeat LHC 05/31/22.    Risks and benefits of cardiac catheterization have been discussed with the patient.  These include bleeding, infection, kidney damage, stroke, heart attack, death.  The patient understands these risks and is willing to proceed.  Access recommendations: R radial Procedural considerations prior LAD PCI  - asymptomatic on nitro drip; continue - continue ASA 81 mg and plavix; continue heparin. - continue statin, goal LDL < 55, will check Lp(a) - continue zetia 10, statin 80, and Lovaza - continue BB (coreg 6.25 mg PO BID) - continue home ARB (normal creatinine) and MRA - will get echo 05/31/22   HTM with DM - sliding scale insulin  and SGLT@i   Peripheral neuropathy Depression - continue zoloft and lyrica, need to clarify if she is taking the klonopin BID but given acute stress will continue her home dose of benzo  GERD - continue  protonix  Tobacco abuse - discuss cessation   Labs for tomorrow Cardiac Carb Consistent Diet for today, NPO for tomorrow Cath orders placed  Full Code   Risk Assessment/Risk Scores:    TIMI Risk Score for Unstable Angina or Non-ST Elevation MI:   The patient's TIMI risk score is 5, which indicates a 26% risk of all cause mortality, new or recurrent myocardial infarction or need for urgent revascularization in the next 14 days.  New York Heart Association (NYHA) Functional Class NYHA Class I   Severity of Illness: The appropriate patient status for this patient is INPATIENT. Inpatient status is judged to be reasonable and necessary in order to provide the required intensity of service to ensure the patient's safety. The patient's presenting symptoms, physical exam findings, and initial radiographic and laboratory data in the context of their chronic comorbidities is felt to place them at high risk for further clinical deterioration. Furthermore, it is not anticipated that the patient will be medically stable for discharge from the hospital within 2 midnights of admission.   * I certify that at the point of admission it is my clinical judgment that the patient will require inpatient hospital care spanning beyond 2 midnights from the point of admission due to high intensity of service, high risk for further deterioration and high frequency of surveillance required.*   For questions or updates, please contact [place] Please consult [email or web address] for contact info under     Signed, Werner Lean, MD  05/30/2022 4:58 PM

## 2022-05-30 NOTE — Progress Notes (Signed)
ANTICOAGULATION CONSULT NOTE - Initial Consult  Pharmacy Consult for IV heparin Indication: chest pain/ACS  Allergies  Allergen Reactions   Codeine Itching    Patient Measurements: Height: 5\' 9"  (175.3 cm) Weight: 101.2 kg (223 lb) IBW/kg (Calculated) : 66.2 Heparin Dosing Weight: 88 kg  Vital Signs: BP: 121/88 (07/23 1536) Pulse Rate: 65 (07/23 1536)  Labs: Recent Labs    05/30/22 1225  HGB 13.7  HCT 42.6  PLT 179  CREATININE 0.83  TROPONINIHS 577*    Estimated Creatinine Clearance: 91.3 mL/min (by C-G formula based on SCr of 0.83 mg/dL).   Medical History: Past Medical History:  Diagnosis Date   CAD (coronary artery disease)    07/10/17 PCI/DES to LAD, EF 35-40%   Diabetes mellitus without complication (HCC)    Hyperlipidemia    Hypertension    NSTEMI (non-ST elevated myocardial infarction) (HCC)     Medications:  Scheduled:   aspirin  324 mg Oral Once   Infusions:   nitroGLYCERIN      Assessment: 61 yo patient presented with chest pain to start IV heparin for ACS/STEMI. Baseline labs done. Patient on asa/plavix PTA  Goal of Therapy:  Heparin level 0.3-0.7 units/ml Monitor platelets by anticoagulation protocol: Yes   Plan:  IV heparin 4000 unit bolus then IV heparin rate of 1050 units/hr Check heparin level 6 hours after start of IV heparin Daily CBC  67 05/30/2022,3:55 PM

## 2022-05-30 NOTE — ED Provider Notes (Signed)
[facility]-EMERGENCY DEPT Provider Note   CSN: [id number] Arrival date & time: 05/30/22  1206     History  No chief complaint on file.   Alice Bennett is a 61 y.o. female.  HPI Patient presents for evaluation of chest pain, for 2 days, after brother died suddenly in his sleep, 2 nights ago.  She reports feeling sad about this.  Yesterday she took several doses of nitroglycerin and took some today as well.  She also took her aspirin this morning.  She was transferred here by EMS.  She has not seen her cardiologist recently.  She denies shortness of breath, cough, weakness or dizziness.  In the ED she reports 7/10 chest pain which is ongoing.    Home Medications Prior to Admission medications   Medication Sig Start Date End Date Taking? Authorizing Provider  carvedilol (COREG) 6.25 MG tablet Take 1 tablet (6.25 mg total) by mouth 2 (two) times daily with a meal. 07/19/17  Yes Lennette Bihari, MD  clonazePAM (KLONOPIN) 1 MG tablet Take 1 mg by mouth 2 (two) times daily.   Yes [provider]  clopidogrel (PLAVIX) 75 MG tablet Take 1 tablet (75 mg total) by mouth daily. 07/19/17  Yes Lennette Bihari, MD  OZEMPIC, 2 MG/DOSE, 8 MG/3ML SOPN Inject 2 mg into the skin once a week. 04/21/22  Yes [provider]  pregabalin (LYRICA) 150 MG capsule Take 150 mg by mouth 2 (two) times daily.   Yes [provider]  sertraline (ZOLOFT) 100 MG tablet Take 100 mg by mouth daily.   Yes [provider]  aspirin EC 81 MG tablet Take 81 mg by mouth daily.    [provider]  atorvastatin (LIPITOR) 80 MG tablet Take 1 tablet (80 mg total) by mouth daily at 6 PM. 07/19/17   Lennette Bihari, MD  ezetimibe (ZETIA) 10 MG tablet Take 1 tablet by mouth once daily 01/02/21   Lennette Bihari, MD  FARXIGA 10 MG TABS tablet Take 10 mg by mouth daily.  03/28/19   [provider]  metFORMIN (GLUCOPHAGE-XR) 500 MG 24 hr tablet Take 500 mg by mouth 2  (two) times daily. 04/25/20   [provider]  Multiple Vitamin (MULTI-VITAMIN) tablet Take by mouth.    [provider]  nitroGLYCERIN (NITROSTAT) 0.4 MG SL tablet Place 0.4 mg under the tongue as needed for chest pain. 04/25/20   [provider]  olmesartan (BENICAR) 20 MG tablet Take 1 tablet (20 mg total) by mouth daily. 03/09/21   Lennette Bihari, MD  omega-3 acid ethyl esters (LOVAZA) 1 g capsule Take 2 capsules (2 g total) by mouth 2 (two) times daily. 07/10/20   Lennette Bihari, MD  pantoprazole (PROTONIX) 40 MG tablet Take 1 tablet (40 mg total) by mouth daily. 07/19/17   Lennette Bihari, MD  spironolactone (ALDACTONE) 25 MG tablet TAKE 1/2 (ONE-HALF) TABLET BY MOUTH ONCE DAILY. MAY TAKE EXTRA 1/2 TABLET AS NEEDED IF SWELLING OCCURS. 09/10/21   Lennette Bihari, MD  TRULICITY 0.75 MG/0.5ML SOPN Inject 0.75 mg into the skin once a week. 04/10/20   [provider]      Allergies    Codeine    Review of Systems   Review of Systems  Physical Exam Updated Vital Signs BP 123/70   Pulse 62   Resp 16   Ht 5\' 9"  (1.753 m)   Wt 101.2 kg   SpO2 99%  BMI 32.93 kg/m  Physical Exam Vitals and nursing note reviewed.  Constitutional:      Appearance: She is well-developed. She is not ill-appearing.  HENT:     Head: Normocephalic and atraumatic.     Right Ear: External ear normal.     Left Ear: External ear normal.  Eyes:     Conjunctiva/sclera: Conjunctivae normal.     Pupils: Pupils are equal, round, and reactive to light.  Neck:     Trachea: Phonation normal.  Cardiovascular:     Rate and Rhythm: Normal rate and regular rhythm.     Heart sounds: Normal heart sounds.  Pulmonary:     Effort: Pulmonary effort is normal. No respiratory distress.     Breath sounds: Normal breath sounds. No stridor.  Abdominal:     General: There is no distension.     Palpations: Abdomen is soft.     Tenderness: There is no abdominal tenderness.  Musculoskeletal:         General: Normal range of motion.     Cervical back: Normal range of motion and neck supple.  Skin:    General: Skin is warm and dry.  Neurological:     Mental Status: She is alert and oriented to person, place, and time.     Cranial Nerves: No cranial nerve deficit.     Sensory: No sensory deficit.     Motor: No abnormal muscle tone.     Coordination: Coordination normal.  Psychiatric:        Mood and Affect: Mood normal.        Behavior: Behavior normal.        Thought Content: Thought content normal.        Judgment: Judgment normal.     ED Results / Procedures / Treatments   Labs (all labs ordered are listed, but only abnormal results are displayed) Labs Reviewed  BASIC METABOLIC PANEL - Abnormal; Notable for the following components:      Result Value   Glucose, Bld 234 (*)    Calcium 8.7 (*)    Anion gap 4 (*)    All other components within normal limits  TROPONIN I (HIGH SENSITIVITY) - Abnormal; Notable for the following components:   Troponin I (High Sensitivity) 577 (*)    All other components within normal limits  CBC  HEPARIN LEVEL (UNFRACTIONATED)  TROPONIN I (HIGH SENSITIVITY)    EKG EKG Interpretation  Date/Time:  Sunday May 30 2022 12:16:14 EDT Ventricular Rate:  66 PR Interval:  191 QRS Duration: 93 QT Interval:  466 QTC Calculation: 489 R Axis:   28 Text Interpretation: Sinus rhythm Low voltage, precordial leads Abnrm T, consider ischemia, anterolateral lds Since last tracing Anterolateral leads have improved with less T wave inversion Confirmed by Mancel Bale [id number]) on 05/30/2022 12:33:58 PM  Radiology DG Chest 2 View  Result Date: 05/30/2022 CLINICAL DATA:  Chest pain since yesterday. EXAM: CHEST - 2 VIEW COMPARISON:  02/17/2021 FINDINGS: Lungs are adequately inflated without focal airspace consolidation or effusion. Cardiomediastinal silhouette and remainder of the exam is unchanged. IMPRESSION: No active cardiopulmonary disease.  Electronically Signed   By: Elberta Fortis M.D.   On: 05/30/2022 13:49    Procedures Procedures    Medications Ordered in ED Medications  nitroGLYCERIN 50 mg in dextrose 5 % 250 mL (0.2 mg/mL) infusion (5 mcg/min Intravenous New Bag/Given 05/30/22 1616)  heparin ADULT infusion 100 units/mL (25000 units/215mL) (1,050 Units/hr Intravenous New Bag/Given 05/30/22 1615)  LORazepam (ATIVAN)  tablet 1 mg (1 mg Oral Given 05/30/22 1239)  aspirin chewable tablet 324 mg (324 mg Oral Given 05/30/22 1615)  heparin bolus via infusion 4,000 Units (4,000 Units Intravenous Bolus from Bag 05/30/22 1614)    ED Course/ Medical Decision Making/ A&P Clinical Course as of 05/30/22 1627  Sun May 30, 2022  1603 Patient states her chest pain has resolved after taking a dose of oral Ativan.  EKG repeated does not show acute changes.  I discussed these findings with the patient and her family members who were on the telephone currently.  All questions answered [EW]  1620 Case discussed with Dr. Elease Hashimoto, who agrees with heparinization and nitroglycerin.  He recommends patient be admitted to the cardiology service at [facility].  Will check on bed availability. [EW]    Clinical Course User Index [EW] Mancel Bale, MD                           Medical Decision Making Patient presenting with chest pain for a day and a half, waxing and waning, despite use of nitroglycerin and aspirin.  Last dose of aspirin at 11:20 AM today.  Patient requires evaluation for grief reaction, ACS, pneumonia and pneumothorax.  Problems Addressed: ACS (acute coronary syndrome) [facility]): acute illness or injury that poses a threat to life or bodily functions  Amount and/or Complexity of Data Reviewed Independent Historian:     Details: She is a cogent historian. Labs: ordered.    Details: CBC, metabolic panel, troponin -- normal except troponin high, calcium low, glucose Radiology: ordered.    Details: Chest x-ray -- no infiltrate or  edema Discussion of management or test interpretation with external provider(s): Case discussed with cardiologist, Dr. Melburn Popper, who is excepting patient for admission.  Risk Prescription drug management. Decision regarding hospitalization. Risk Details: Patient presenting with chest pain following morning of her brother's death, 2 days ago.  She has been using nitroglycerin at home without relief.  On arrival her EKG was compared with 1 from 2018, through the MUSE system.  This comparison indicated that the EKG today was improved.  Subsequently an EKG from January 06, 2021 was found, that when compared, showed today's tracing had T wave inversion he had V2 and lead I that was new.  Patient troponin returned elevated.  Pain resolved after treatment of Ativan.  Consideration for Takotsubo's cardiomyopathy causing discomfort and troponin elevation.  Patient does have known coronary disease, cardiac artery stenting.  Patient requires evaluation treat by cardiology.  Heparin and nitroglycerin started.  No evidence for acute heart failure.  Hemodynamically stable.  Plan admission to cardiology service.  Critical Care Total time providing critical care: 45 minutes           Final Clinical Impression(s) / ED Diagnoses Final diagnoses:  ACS (acute coronary syndrome) [facility])    Rx / DC Orders ED Discharge Orders     None         Mancel Bale, MD 05/30/22 [phone]

## 2022-05-31 ENCOUNTER — Encounter (HOSPITAL_COMMUNITY)
Admission: EM | Disposition: A | Discharge status: Home / Self Care | Payer: Self-pay | Source: Home / Self Care | Attending: Internal Medicine

## 2022-05-31 ENCOUNTER — Inpatient Hospital Stay (HOSPITAL_COMMUNITY): Payer: Medicaid Other

## 2022-05-31 DIAGNOSIS — R079 Chest pain, unspecified: Secondary | ICD-10-CM

## 2022-05-31 DIAGNOSIS — I2510 Atherosclerotic heart disease of native coronary artery without angina pectoris: Secondary | ICD-10-CM

## 2022-05-31 HISTORY — PX: RIGHT/LEFT HEART CATH AND CORONARY ANGIOGRAPHY: CATH118266

## 2022-05-31 HISTORY — PX: CORONARY PRESSURE/FFR STUDY: CATH118243

## 2022-05-31 HISTORY — PX: CORONARY STENT INTERVENTION: CATH118234

## 2022-05-31 HISTORY — PX: INTRAVASCULAR PRESSURE WIRE/FFR STUDY: CATH118243

## 2022-05-31 LAB — POCT I-STAT EG7
Acid-Base Excess: 0 mmol/L (ref 0.0–2.0)
Bicarbonate: 25.6 mmol/L (ref 20.0–28.0)
Calcium, Ion: 1.23 mmol/L (ref 1.15–1.40)
HCT: 35 % — ABNORMAL LOW (ref 36.0–46.0)
Hemoglobin: 11.9 g/dL — ABNORMAL LOW (ref 12.0–15.0)
O2 Saturation: 62 %
Potassium: 4 mmol/L (ref 3.5–5.1)
Sodium: 143 mmol/L (ref 135–145)
TCO2: 27 mmol/L (ref 22–32)
pCO2, Ven: 46.2 mmHg (ref 44–60)
pH, Ven: 7.351 (ref 7.25–7.43)
pO2, Ven: 34 mmHg (ref 32–45)

## 2022-05-31 LAB — CBC
HCT: 36.9 % (ref 36.0–46.0)
Hemoglobin: 12.3 g/dL (ref 12.0–15.0)
MCH: 28.7 pg (ref 26.0–34.0)
MCHC: 33.3 g/dL (ref 30.0–36.0)
MCV: 86.2 fL (ref 80.0–100.0)
Platelets: 158 K/uL (ref 150–400)
RBC: 4.28 MIL/uL (ref 3.87–5.11)
RDW: 15.1 % (ref 11.5–15.5)
WBC: 8.6 K/uL (ref 4.0–10.5)
nRBC: 0 % (ref 0.0–0.2)

## 2022-05-31 LAB — POCT I-STAT 7, (LYTES, BLD GAS, ICA,H+H)
Acid-Base Excess: 0 mmol/L (ref 0.0–2.0)
Bicarbonate: 24.6 mmol/L (ref 20.0–28.0)
Calcium, Ion: 1.25 mmol/L (ref 1.15–1.40)
HCT: 35 % — ABNORMAL LOW (ref 36.0–46.0)
Hemoglobin: 11.9 g/dL — ABNORMAL LOW (ref 12.0–15.0)
O2 Saturation: 95 %
Potassium: 4.1 mmol/L (ref 3.5–5.1)
Sodium: 142 mmol/L (ref 135–145)
TCO2: 26 mmol/L (ref 22–32)
pCO2 arterial: 39.6 mmHg (ref 32–48)
pH, Arterial: 7.4 (ref 7.35–7.45)
pO2, Arterial: 77 mmHg — ABNORMAL LOW (ref 83–108)

## 2022-05-31 LAB — GLUCOSE, CAPILLARY
Glucose-Capillary: 140 mg/dL — ABNORMAL HIGH (ref 70–99)
Glucose-Capillary: 147 mg/dL — ABNORMAL HIGH (ref 70–99)
Glucose-Capillary: 193 mg/dL — ABNORMAL HIGH (ref 70–99)
Glucose-Capillary: 136 mg/dL — ABNORMAL HIGH (ref 70–99)

## 2022-05-31 LAB — BASIC METABOLIC PANEL
Anion gap: 8 (ref 5–15)
BUN: 10 mg/dL (ref 6–20)
CO2: 24 mmol/L (ref 22–32)
Calcium: 8.9 mg/dL (ref 8.9–10.3)
Chloride: 107 mmol/L (ref 98–111)
Creatinine, Ser: 1.06 mg/dL — ABNORMAL HIGH (ref 0.44–1.00)
GFR, Estimated: >60 mL/min (ref >60)
Glucose, Bld: 183 mg/dL — ABNORMAL HIGH (ref 70–99)
Potassium: 3.2 mmol/L — ABNORMAL LOW (ref 3.5–5.1)
Sodium: 139 mmol/L (ref 135–145)

## 2022-05-31 LAB — BASIC METABOLIC PANEL WITH GFR
Anion gap: 8 (ref 5–15)
BUN: 10 mg/dL (ref 6–20)
CO2: 23 mmol/L (ref 22–32)
Calcium: 8.5 mg/dL — ABNORMAL LOW (ref 8.9–10.3)
Chloride: 109 mmol/L (ref 98–111)
Creatinine, Ser: 0.99 mg/dL (ref 0.44–1.00)
GFR, Estimated: >60 mL/min
Glucose, Bld: 152 mg/dL — ABNORMAL HIGH (ref 70–99)
Potassium: 3.6 mmol/L (ref 3.5–5.1)
Sodium: 140 mmol/L (ref 135–145)

## 2022-05-31 LAB — ECHOCARDIOGRAM COMPLETE
AR max vel: 2.34 cm2
AV Peak grad: 12 mmHg
Ao pk vel: 1.73 m/s
Area-P 1/2: 3.3 cm2
Height: 69 in
P 1/2 time: 456 msec
S' Lateral: 4.1 cm
Weight: 3622.6 oz

## 2022-05-31 LAB — POCT ACTIVATED CLOTTING TIME
Activated Clotting Time: 275 seconds
Activated Clotting Time: 299 seconds

## 2022-05-31 LAB — HEPARIN LEVEL (UNFRACTIONATED)
Heparin Unfractionated: 0.12 IU/mL — ABNORMAL LOW (ref 0.30–0.70)
Heparin Unfractionated: 0.19 [IU]/mL — ABNORMAL LOW (ref 0.30–0.70)

## 2022-05-31 LAB — LIPID PANEL
Cholesterol: 185 mg/dL (ref 0–200)
HDL: 33 mg/dL — ABNORMAL LOW (ref >40)
LDL Cholesterol: 111 mg/dL — ABNORMAL HIGH (ref 0–99)
Total CHOL/HDL Ratio: 5.6 RATIO
Triglycerides: 205 mg/dL — ABNORMAL HIGH (ref <150)
VLDL: 41 mg/dL — ABNORMAL HIGH (ref 0–40)

## 2022-05-31 SURGERY — RIGHT/LEFT HEART CATH AND CORONARY ANGIOGRAPHY
Anesthesia: LOCAL

## 2022-05-31 MED ORDER — FENTANYL CITRATE (PF) 100 MCG/2ML IJ SOLN
INTRAMUSCULAR | Status: AC
  Filled 2022-05-31: qty 2

## 2022-05-31 MED ORDER — PREGABALIN 75 MG PO CAPS
150.0000 mg | ORAL_CAPSULE | Freq: Two times a day (BID) | ORAL | Status: DC
  Administered 2022-05-31 – 2022-06-01 (×2): 150 mg via ORAL
  Filled 2022-05-31 (×2): qty 2

## 2022-05-31 MED ORDER — CLOPIDOGREL BISULFATE 300 MG PO TABS
ORAL_TABLET | ORAL | Status: AC
Start: 2022-05-31 — End: ?
  Filled 2022-05-31: qty 1

## 2022-05-31 MED ORDER — VERAPAMIL HCL 2.5 MG/ML IV SOLN
INTRAVENOUS | Status: AC
  Filled 2022-05-31: qty 2

## 2022-05-31 MED ORDER — HEPARIN SODIUM (PORCINE) 1000 UNIT/ML IJ SOLN
INTRAMUSCULAR | Status: DC | PRN
  Administered 2022-05-31: 5000 [IU] via INTRAVENOUS
  Administered 2022-05-31: 3000 [IU] via INTRAVENOUS
  Administered 2022-05-31: 5000 [IU] via INTRAVENOUS

## 2022-05-31 MED ORDER — FENTANYL CITRATE (PF) 100 MCG/2ML IJ SOLN
INTRAMUSCULAR | Status: DC | PRN
  Administered 2022-05-31 (×2): 12.5 ug via INTRAVENOUS
  Administered 2022-05-31: 25 ug via INTRAVENOUS

## 2022-05-31 MED ORDER — IOHEXOL 350 MG/ML SOLN
INTRAVENOUS | Status: DC | PRN
  Administered 2022-05-31: 120 mL

## 2022-05-31 MED ORDER — NITROGLYCERIN 1 MG/10 ML FOR IR/CATH LAB
INTRA_ARTERIAL | Status: AC
  Filled 2022-05-31: qty 10

## 2022-05-31 MED ORDER — POTASSIUM CHLORIDE CRYS ER 20 MEQ PO TBCR
40.0000 meq | EXTENDED_RELEASE_TABLET | Freq: Once | ORAL | Status: AC
Start: 2022-05-31 — End: 2022-05-31
  Administered 2022-05-31: 40 meq via ORAL
  Filled 2022-05-31: qty 2

## 2022-05-31 MED ORDER — HYDRALAZINE HCL 20 MG/ML IJ SOLN: 10.0000 mg | INTRAMUSCULAR | Status: AC | PRN

## 2022-05-31 MED ORDER — MIDAZOLAM HCL 2 MG/2ML IJ SOLN
INTRAMUSCULAR | Status: DC | PRN
  Administered 2022-05-31: 1 mg via INTRAVENOUS
  Administered 2022-05-31 (×2): .5 mg via INTRAVENOUS

## 2022-05-31 MED ORDER — HEPARIN SODIUM (PORCINE) 1000 UNIT/ML IJ SOLN
INTRAMUSCULAR | Status: AC
  Filled 2022-05-31: qty 10

## 2022-05-31 MED ORDER — VERAPAMIL HCL 2.5 MG/ML IV SOLN
INTRAVENOUS | Status: DC | PRN
  Administered 2022-05-31: 10 mL via INTRA_ARTERIAL

## 2022-05-31 MED ORDER — MIDAZOLAM HCL 2 MG/2ML IJ SOLN
INTRAMUSCULAR | Status: AC
  Filled 2022-05-31: qty 2

## 2022-05-31 MED ORDER — FUROSEMIDE 10 MG/ML IJ SOLN
20.0000 mg | Freq: Every day | INTRAMUSCULAR | Status: DC
  Administered 2022-05-31 – 2022-06-01 (×2): 20 mg via INTRAVENOUS
  Filled 2022-05-31 (×2): qty 2

## 2022-05-31 MED ORDER — LIDOCAINE HCL (PF) 1 % IJ SOLN
INTRAMUSCULAR | Status: AC
  Filled 2022-05-31: qty 30

## 2022-05-31 MED ORDER — CLONAZEPAM 0.5 MG PO TABS
1.0000 mg | ORAL_TABLET | Freq: Two times a day (BID) | ORAL | Status: DC
  Administered 2022-05-31 – 2022-06-01 (×3): 1 mg via ORAL
  Filled 2022-05-31 (×3): qty 2

## 2022-05-31 MED ORDER — SODIUM CHLORIDE 0.9% FLUSH
3.0000 mL | Freq: Two times a day (BID) | INTRAVENOUS | Status: DC
Start: 2022-05-31 — End: 2022-06-01
  Administered 2022-06-01: 3 mL via INTRAVENOUS

## 2022-05-31 MED ORDER — LABETALOL HCL 5 MG/ML IV SOLN: 10.0000 mg | INTRAVENOUS | Status: AC | PRN

## 2022-05-31 MED ORDER — HEPARIN (PORCINE) IN NACL 1000-0.9 UT/500ML-% IV SOLN
INTRAVENOUS | Status: AC
  Filled 2022-05-31: qty 1000

## 2022-05-31 MED ORDER — NITROGLYCERIN 1 MG/10 ML FOR IR/CATH LAB
INTRA_ARTERIAL | Status: DC | PRN
  Administered 2022-05-31 (×3): 200 ug via INTRACORONARY

## 2022-05-31 MED ORDER — HEPARIN (PORCINE) IN NACL 1000-0.9 UT/500ML-% IV SOLN
INTRAVENOUS | Status: DC | PRN
  Administered 2022-05-31 (×2): 500 mL

## 2022-05-31 MED ORDER — SODIUM CHLORIDE 0.9 % IV SOLN
250.0000 mL | INTRAVENOUS | Status: DC | PRN
  Administered 2022-06-01: 250 mL via INTRAVENOUS

## 2022-05-31 MED ORDER — LIDOCAINE HCL (PF) 1 % IJ SOLN
INTRAMUSCULAR | Status: DC | PRN
  Administered 2022-05-31 (×2): 2 mL

## 2022-05-31 MED ORDER — HEPARIN BOLUS VIA INFUSION
3000.0000 [IU] | Freq: Once | INTRAVENOUS | Status: AC
  Administered 2022-05-31: 3000 [IU] via INTRAVENOUS
  Filled 2022-05-31: qty 3000

## 2022-05-31 MED ORDER — ENOXAPARIN SODIUM 40 MG/0.4ML IJ SOSY
40.0000 mg | PREFILLED_SYRINGE | INTRAMUSCULAR | Status: DC
  Administered 2022-06-01: 40 mg via SUBCUTANEOUS
  Filled 2022-05-31: qty 0.4

## 2022-05-31 MED ORDER — CLOPIDOGREL BISULFATE 300 MG PO TABS
ORAL_TABLET | ORAL | Status: DC | PRN
  Administered 2022-05-31: 300 mg via ORAL

## 2022-05-31 MED ORDER — SODIUM CHLORIDE 0.9% FLUSH: 3.0000 mL | INTRAVENOUS | Status: DC | PRN

## 2022-05-31 SURGICAL SUPPLY — 25 items
BALL SAPPHIRE NC24 3.25X8 (BALLOONS) ×2
BALLN SAPPHIRE 2.5X12 (BALLOONS) ×2
BALLOON SAPPHIRE 2.5X12 (BALLOONS) IMPLANT
BALLOON SAPPHIRE NC24 3.25X8 (BALLOONS) IMPLANT
BAND CMPR LRG ZPHR (HEMOSTASIS) ×1
BAND ZEPHYR COMPRESS 30 LONG (HEMOSTASIS) ×1 IMPLANT
CATH BALLN WEDGE 5F 110CM (CATHETERS) ×1 IMPLANT
CATH INFINITI 5FR ANG PIGTAIL (CATHETERS) ×1 IMPLANT
CATH LAUNCHER 6FR EBU3.5 (CATHETERS) ×1 IMPLANT
CATH LAUNCHER 6FR JR4 (CATHETERS) ×1 IMPLANT
CATH OPTITORQUE TIG 4.0 5F (CATHETERS) ×1 IMPLANT
GLIDESHEATH SLEND SS 6F .021 (SHEATH) ×1 IMPLANT
GUIDEWIRE INQWIRE 1.5J.035X260 (WIRE) IMPLANT
GUIDEWIRE PRESSURE X 175 (WIRE) ×1 IMPLANT
INQWIRE 1.5J .035X260CM (WIRE) ×2
KIT ENCORE 26 ADVANTAGE (KITS) ×1 IMPLANT
KIT ESSENTIALS PG (KITS) ×1 IMPLANT
KIT HEART LEFT (KITS) ×2 IMPLANT
PACK CARDIAC CATHETERIZATION (CUSTOM PROCEDURE TRAY) ×2 IMPLANT
SHEATH GLIDE SLENDER 4/5FR (SHEATH) ×1 IMPLANT
SHEATH PROBE COVER 6X72 (BAG) ×1 IMPLANT
STENT SYNERGY XD 3.0X12 (Permanent Stent) IMPLANT
SYNERGY XD 3.0X12 (Permanent Stent) ×2 IMPLANT
TRANSDUCER W/STOPCOCK (MISCELLANEOUS) ×2 IMPLANT
TUBING CIL FLEX 10 FLL-RA (TUBING) ×2 IMPLANT

## 2022-05-31 NOTE — Brief Op Note (Signed)
BRIEF CARDIAC CATHETERIZATION NOTE  05/31/2022  12:04 PM  PATIENT:  Alice Bennett  61 y.o. female  PRE-OPERATIVE DIAGNOSIS:  NSTEMI  POST-OPERATIVE DIAGNOSIS:  NSTEMI  PROCEDURE:  Procedure(s): RIGHT/LEFT HEART CATH AND CORONARY ANGIOGRAPHY (N/A) INTRAVASCULAR PRESSURE WIRE/FFR STUDY (N/A) CORONARY STENT INTERVENTION (N/A)  SURGEON:  Surgeon(s) and Role:    * Anysia Choi, MD - Primary  FINDINGS: Moderate two-vessel coronary artery disease with 50-60% mid LAD stenosis between 2 previously placed stents that is hemodynamically significant (RFR 0.89) and 50-60% proximal RCA stenosis that is borderline hemodynamically significant (RFR 0.90). Widely patent proximal and mid LAD stents. Low normal left ventricular systolic function (LVEF 50-55%). Moderately elevated left heart filling pressures (PCWP/LVEDP 25-30 mmHg). Low normal to mildly reduced Fick cardiac output (CO 5.3 L/min, CI 2.4 L/min/m). Successful RFR-guided PCI to mid LAD using Synergy 3.0 x 12 mm drug-eluting stent with 0% residual stenosis and TIMI-3 flow.  RECOMMENDATIONS: Continue indefinite dual antiplatelet therapy with aspirin and clopidogrel. Favor medical therapy of RCA disease.  Could consider PCI to proximal RCA if symptoms do not resolve despite aggressive antianginal and heart failure therapy. Gentle diuresis. Aggressive secondary prevention of coronary artery disease.  Yvonne Kendall, MD [facility]

## 2022-05-31 NOTE — Progress Notes (Signed)
ANTICOAGULATION CONSULT NOTE  Pharmacy Consult for IV heparin Indication: chest pain/ACS  Allergies  Allergen Reactions   Codeine Itching    Patient Measurements: Height: 5\' 9"  (175.3 cm) Weight: 102.7 kg (226 lb 6.6 oz) IBW/kg (Calculated) : 66.2 Heparin Dosing Weight: 88 kg  Vital Signs: Temp: 98 F (36.7 C) (07/24 0544) Temp Source: Oral (07/24 0544) BP: 139/53 (07/24 0544) Pulse Rate: 68 (07/24 0600)  Labs: Recent Labs    05/30/22 1225 05/30/22 1425 05/30/22 2330 05/31/22 0425 05/31/22 0654  HGB 13.7  --   --  12.3  --   HCT 42.6  --   --  36.9  --   PLT 179  --   --  158  --   HEPARINUNFRC  --   --  0.12*  --  0.19*  CREATININE 0.83  --  1.06*  --   --   TROPONINIHS 577* 1,576*  --   --   --      Estimated Creatinine Clearance: 72 mL/min (A) (by C-G formula based on SCr of 1.06 mg/dL (H)).   Medical History: Past Medical History:  Diagnosis Date   CAD (coronary artery disease)    07/10/17 PCI/DES to LAD, EF 35-40%   Diabetes mellitus without complication (HCC)    Hyperlipidemia    Hypertension    NSTEMI (non-ST elevated myocardial infarction) (HCC)     Medications:  Scheduled:   aspirin EC  81 mg Oral Daily   atorvastatin  80 mg Oral q1800   carvedilol  6.25 mg Oral BID WC   clopidogrel  75 mg Oral Daily   dapagliflozin propanediol  10 mg Oral Daily   ezetimibe  10 mg Oral Daily   insulin aspart  0-20 Units Subcutaneous TID WC   insulin aspart  6 Units Subcutaneous TID WC   irbesartan  150 mg Oral Daily   omega-3 acid ethyl esters  2 g Oral BID   pantoprazole  40 mg Oral Daily   sertraline  100 mg Oral Daily   sodium chloride flush  3 mL Intravenous Q12H   spironolactone  12.5 mg Oral Daily   Infusions:   sodium chloride     sodium chloride 1 mL/kg/hr (05/31/22 0600)   heparin 1,300 Units/hr (05/31/22 0600)   nitroGLYCERIN 5 mcg/min (05/31/22 0600)    Assessment: 61 yo patient presented with chest pain to start IV heparin for  ACS/STEMI. Baseline labs done. Patient on asa/plavix PTA.  Heparin level this morning came back subtherapeutic at 0.19, on 1300 units/hr. Hgb 12.3, plt 158. No s/sx of bleeding or infusion issues.  Goal of Therapy:  Heparin level 0.3-0.7 units/ml Monitor platelets by anticoagulation protocol: Yes   Plan:  Increase IV heparin rate to 1500 units/hr Check heparin level 6 hours after start of IV heparin Daily CBC, HL, and for s/sx of bleeding F/u after cath   67, PharmD, BCCCP Clinical Pharmacist  Phone: [phone] 05/31/2022 8:08 AM  Please check AMION for all [facility] phone numbers After 10:00 PM, call Main Pharmacy [phone]

## 2022-05-31 NOTE — Interval H&P Note (Signed)
History and Physical Interval Note:  05/31/2022 10:23 AM  Alice Bennett  has presented today for surgery, with the diagnosis of NSTEMI.  The various methods of treatment have been discussed with the patient and family. After consideration of risks, benefits and other options for treatment, the patient has consented to  Procedure(s): RIGHT/LEFT HEART CATH AND CORONARY ANGIOGRAPHY (N/A) as a surgical intervention.  The patient's history has been reviewed, patient examined, no change in status, stable for surgery.  I have reviewed the patient's chart and labs.  Questions were answered to the patient's satisfaction.    Cath Lab Visit (complete for each Cath Lab visit)  Clinical Evaluation Leading to the Procedure:   ACS: Yes.    Non-ACS:  N/A  Chidera Thivierge

## 2022-05-31 NOTE — Progress Notes (Addendum)
Progress Note  Patient Name: Alice Bennett Date of Encounter: 05/31/2022  [facility] Cardiologist: Nicki Guadalajara, MD   Subjective   No complaints, planned for cardiac cath today,   Inpatient Medications    Scheduled Meds:  aspirin EC  81 mg Oral Daily   atorvastatin  80 mg Oral q1800   carvedilol  6.25 mg Oral BID WC   clopidogrel  75 mg Oral Daily   dapagliflozin propanediol  10 mg Oral Daily   ezetimibe  10 mg Oral Daily   insulin aspart  0-20 Units Subcutaneous TID WC   insulin aspart  6 Units Subcutaneous TID WC   irbesartan  150 mg Oral Daily   omega-3 acid ethyl esters  2 g Oral BID   pantoprazole  40 mg Oral Daily   sertraline  100 mg Oral Daily   sodium chloride flush  3 mL Intravenous Q12H   spironolactone  12.5 mg Oral Daily   Continuous Infusions:  sodium chloride     sodium chloride 1 mL/kg/hr (05/31/22 0600)   heparin 1,300 Units/hr (05/31/22 0600)   nitroGLYCERIN 5 mcg/min (05/31/22 0600)   PRN Meds: sodium chloride, acetaminophen, nitroGLYCERIN, ondansetron (ZOFRAN) IV, mouth rinse, pneumococcal 23 valent vaccine, sodium chloride flush   Vital Signs    Vitals:   05/31/22 0415 05/31/22 0500 05/31/22 0544 05/31/22 0600  BP: 128/61  (!) 139/53   Pulse: 75 69 69 68  Resp:      Temp:   98 F (36.7 C)   TempSrc:   Oral   SpO2: 96% 94% 96% 96%  Weight:      Height:        Intake/Output Summary (Last 24 hours) at 05/31/2022 0755 Last data filed at 05/31/2022 0600 Gross per 24 hour  Intake 560.7 ml  Output --  Net 560.7 ml      05/30/2022    8:45 PM 05/30/2022   12:21 PM 03/09/2021    3:01 PM  Last 3 Weights  Weight (lbs) 226 lb 6.6 oz 223 lb 243 lb 3.2 oz  Weight (kg) 102.7 kg 101.152 kg 110.315 kg      Telemetry    Sinus Rhythm - Personally Reviewed  ECG    No new tracing this morning.   Physical Exam   GEN: No acute distress.   Neck: No JVD Cardiac: RRR, no murmurs, rubs, or gallops.  Respiratory: Clear to auscultation  bilaterally. GI: Soft, nontender, non-distended  MS: No edema; No deformity. Neuro:  Nonfocal  Psych: Normal affect   Labs    High Sensitivity Troponin:   Recent Labs  Lab 05/30/22 1225 05/30/22 1425  TROPONINIHS 577* 1,576*     Chemistry Recent Labs  Lab 05/30/22 1225 05/30/22 2330  NA 140 139  K 4.1 3.2*  CL 108 107  CO2 28 24  GLUCOSE 234* 183*  BUN 11 10  CREATININE 0.83 1.06*  CALCIUM 8.7* 8.9  GFRNONAA >60 >60  ANIONGAP 4* 8    Lipids  Recent Labs  Lab 05/30/22 2330  CHOL 185  TRIG 205*  HDL 33*  LDLCALC 111*  CHOLHDL 5.6    Hematology Recent Labs  Lab 05/30/22 1225 05/31/22 0425  WBC 9.0 8.6  RBC 4.80 4.28  HGB 13.7 12.3  HCT 42.6 36.9  MCV 88.8 86.2  MCH 28.5 28.7  MCHC 32.2 33.3  RDW 15.0 15.1  PLT 179 158   Thyroid No results for input(s): "TSH", "FREET4" in the last 168 hours.  BNPNo results for input(s): "BNP", "PROBNP" in the last 168 hours.  DDimer No results for input(s): "DDIMER" in the last 168 hours.   Radiology    DG Chest 2 View  Result Date: 05/30/2022 CLINICAL DATA:  Chest pain since yesterday. EXAM: CHEST - 2 VIEW COMPARISON:  02/17/2021 FINDINGS: Lungs are adequately inflated without focal airspace consolidation or effusion. Cardiomediastinal silhouette and remainder of the exam is unchanged. IMPRESSION: No active cardiopulmonary disease. Electronically Signed   By: Elberta Fortis M.D.   On: 05/30/2022 13:49    Cardiac Studies   Echo: pending  Patient Profile     61 y.o. female with CAD with Prior NSTEMI (PCI in 2014 pLAD, mLAD PCI 2018 ) with non obstructive LCX and RCA, EF 35-40%, Mild AI and tobacco abuse, HTN with DM, HLD with DM with chart history of Takotsubo Cardiomyopathy and LV recover in 2021 (despite only LAD PCI) who is being seen 05/30/2022 for the evaluation of NSTEMI after death of mother.  Assessment & Plan    NSTEMI Prior PCI to LAD' 18, nonobstructive disease in circumflex and RCA --High-sensitivity  troponin 577>> 1576.  Planned for cardiac catheterization today.  No recurrent chest pain --Continue aspirin, IV heparin, nitro, statin, beta-blocker, and Plavix --Echo pending  Hypertension: Stable --Continue Coreg 6.25 mg twice daily, irbesartan 150 mg daily, Aldactone 12.5 mg daily  Hyperlipidemia: LDL 111 --Continue atorvastatin 80 mg daily, Zetia 10 mg daily, Lovaza 2 g twice daily -- Consider lipid clinic referral at discharge  Diabetes: Hemoglobin A1c 6.5 --SSI --Continue Farxiga  History of Takotsubo cardiomyopathy: Significant volume overload noted on exam.  -- Continue Coreg 6.25 mg twice daily, irbesartan 150 mg daily, Aldactone 12.5 mg daily --Echo pending  Tobacco use: Cessation advised  For questions or updates, please contact [facility] Please consult [email or web address] for contact info under        Signed, Laverda Page, NP  05/31/2022, 7:55 AM

## 2022-05-31 NOTE — Progress Notes (Signed)
ANTICOAGULATION CONSULT NOTE - Follow Up Consult  Pharmacy Consult for heparin Indication:  NSTEMI  Labs: Recent Labs    05/30/22 1225 05/30/22 1425 05/30/22 2330  HGB 13.7  --   --   HCT 42.6  --   --   PLT 179  --   --   HEPARINUNFRC  --   --  0.12*  CREATININE 0.83  --   --   TROPONINIHS 577* 1,576*  --     Assessment: 60yo female subtherapeutic on heparin with initial dosing for NSTEMI; no infusion issues or signs of bleeding per RN.  Goal of Therapy:  Heparin level 0.3-0.7 units/ml   Plan:  Will rebolus with heparin 3000 units and increase heparin infusion by 3 units/kgABW/hr to 1300 units/hr and check level in 6 hours.    Vernard Gambles, PharmD, BCPS  05/31/2022,12:17 AM

## 2022-06-01 ENCOUNTER — Encounter: Payer: Self-pay | Admitting: *Deleted

## 2022-06-01 ENCOUNTER — Encounter (HOSPITAL_COMMUNITY): Payer: Self-pay | Admitting: Internal Medicine

## 2022-06-01 DIAGNOSIS — Z720 Tobacco use: Secondary | ICD-10-CM

## 2022-06-01 DIAGNOSIS — Z006 Encounter for examination for normal comparison and control in clinical research program: Secondary | ICD-10-CM

## 2022-06-01 DIAGNOSIS — E785 Hyperlipidemia, unspecified: Secondary | ICD-10-CM

## 2022-06-01 DIAGNOSIS — I10 Essential (primary) hypertension: Secondary | ICD-10-CM

## 2022-06-01 LAB — BASIC METABOLIC PANEL
Anion gap: 8 (ref 5–15)
BUN: 15 mg/dL (ref 6–20)
CO2: 26 mmol/L (ref 22–32)
Calcium: 8.5 mg/dL — ABNORMAL LOW (ref 8.9–10.3)
Chloride: 105 mmol/L (ref 98–111)
Creatinine, Ser: 1.22 mg/dL — ABNORMAL HIGH (ref 0.44–1.00)
GFR, Estimated: 51 mL/min — ABNORMAL LOW (ref >60)
Glucose, Bld: 145 mg/dL — ABNORMAL HIGH (ref 70–99)
Potassium: 3.2 mmol/L — ABNORMAL LOW (ref 3.5–5.1)
Sodium: 139 mmol/L (ref 135–145)

## 2022-06-01 LAB — CBC
HCT: 37.2 % (ref 36.0–46.0)
Hemoglobin: 12.7 g/dL (ref 12.0–15.0)
MCH: 28.7 pg (ref 26.0–34.0)
MCHC: 34.1 g/dL (ref 30.0–36.0)
MCV: 84.2 fL (ref 80.0–100.0)
Platelets: 187 10*3/uL (ref 150–400)
RBC: 4.42 MIL/uL (ref 3.87–5.11)
RDW: 14.6 % (ref 11.5–15.5)
WBC: 9.2 10*3/uL (ref 4.0–10.5)
nRBC: 0 % (ref 0.0–0.2)

## 2022-06-01 LAB — LIPOPROTEIN A (LPA): Lipoprotein (a): 81.8 nmol/L — ABNORMAL HIGH (ref <75.0)

## 2022-06-01 LAB — GLUCOSE, CAPILLARY
Glucose-Capillary: 198 mg/dL — ABNORMAL HIGH (ref 70–99)
Glucose-Capillary: 114 mg/dL — ABNORMAL HIGH (ref 70–99)

## 2022-06-01 LAB — MAGNESIUM: Magnesium: 1.6 mg/dL — ABNORMAL LOW (ref 1.7–2.4)

## 2022-06-01 MED ORDER — MAGNESIUM SULFATE 4 GM/100ML IV SOLN
4.0000 g | Freq: Once | INTRAVENOUS | Status: AC
  Administered 2022-06-01: 4 g via INTRAVENOUS
  Filled 2022-06-01: qty 100

## 2022-06-01 MED ORDER — POTASSIUM CHLORIDE CRYS ER 20 MEQ PO TBCR
40.0000 meq | EXTENDED_RELEASE_TABLET | Freq: Once | ORAL | Status: AC
Start: 2022-06-01 — End: 2022-06-01
  Administered 2022-06-01: 40 meq via ORAL
  Filled 2022-06-01: qty 2

## 2022-06-01 NOTE — Progress Notes (Signed)
CARDIAC REHAB PHASE I   Offered to walk with pt. Pt states independent ambulation without difficulty. Pt denies CP, SOB, or dizziness. Pt educated on importance of ASA and Plavix. Pt given heart healthy and diabetic diets. Reviewed site care, restrictions, and exercise guidelines. Will refer to CRP II [place], pt unsure if she will be able to attend due to transportation.  [id number] Reynold Bowen, RN BSN 06/01/2022 8:47 AM

## 2022-06-01 NOTE — Research (Addendum)
EVOLVE Informed Consent   Subject Name: Alice Bennett [facility]  Subject met inclusion and exclusion criteria.  The informed consent form, study requirements and expectations were reviewed with the subject and questions and concerns were addressed prior to the signing of the consent form.  The subject verbalized understanding of the trial requirements.  The subject agreed to participate in the EVOLVE trial and signed the informed consent at 08:46 on 06/01/2022.  The informed consent was obtained prior to performance of any protocol-specific procedures for the subject.  A copy of the signed informed consent was given to the subject and a copy was placed in the subject's medical record.   Alice Bennett    Protocol # [id number]  Subject Initial ID#:   [id number]                               Age in years: 75  *Demographics are found in the [facility] EMR source.   Protocol Version: v2.00 [id number]  Were all Eligibility Criteria Met?  '[x]'  Yes  '[]'   No  Screening Visit Date:                01-Jun-2022  10.2 Targeted Medical History:  Qualifying Acute Myocardial Infarction Event  '[x]'  NSTEMI  '[]'  STEMI  Admission Date For Index Myocardial Infarction:     30-May-2022  Initial Management Strategy: '[]'  Medical Management ONLY     '[x]'  Invasive Mgmt (including Lysis)  Diagnostic Coronary Angiography Performed for Index MI Event? '[x]'  Yes '[]'  No   Diagnostic Coronary Angiography Performed For Index Myocardial Infarction Event (Select All That Apply)    '[x]'  Invasive Coronary Angiography         '[]'  Computed Tomography (CT) angiography   Coronary Revascularization Performed For Index Myocardial Infarction Event?    '[x]'  Yes  '[]'  No  Coronary Revascularization Performed For Index Myocardial Infarction Event (Select All That Apply)  '[x]' Percutaneous Coronary Intervention (PCI)                                        '[]'  Coronary Artery Bypass Graft (CABG)                  '[]'  Fibrinolysis  Ejection Fraction  Measured?  '[x]'  Yes  '[]'   No  Ejection Fraction (%) __55__  (must be whole number)  Peak Troponin Level For Index Myocardial Infarction Event?  ___1576___   Peak Troponin Unit For Index Myocardial Infarction Event?  ng/L Troponin Normal Range Upper Limit    _18__  History of Multi-vessel Coronary Artery Disease (Including defined during index myocardial infarction event)   '[]'  Yes  '[x]'   No  Cardiovascular and Other Medical History History of Acute Myocardial Infarction Prior To Index Event   '[x]'  Yes '[]'  No  History of Coronary Artery Bypass Graft (CABG) '[]'  Yes  '[x]'   No  History of Percutaneous Coronary Intervention (PCI)  '[x]'  Yes  '[]'   No  History Of Hypertension  '[x]'  Yes  '[]'   No  History of Diabetes Mellitus  '[x]'  Yes  '[]'   No  History of Peripheral Artery Disease (Lower Extremity, Carotid Disease)                            '[]'  Yes  '[x]'   No  History of  Cerebrovascular Disease   (Prior Stroke, Transient Ischemic Attack (TIA))  '[]'  Yes  '[x]'   No  History of Lower Extremity Revascularization  '[]'  Yes  '[x]'   No  History of Major Lower Extremity Amputation At Or Above The Level Of The Ankle                        '[]'  Yes  '[x]'   No  History of Smoking Tobacco  '[]'  Never Smoked     '[]'  Former Smoker     '[x]'  Current Smoker     '[]'  Unknown  VITAL SIGNS (after Consent is signed) Height   __69_____       '[]'  centimeters  '[x]'   Inches  Weight _226_____  '[]'  Kilograms  '[x]'  Lbs   Systolic Blood Pressure (mmHg)    109  (Most recent Prior to Randomization)  Diastolic Blood Pressure (mmHg)    ___51__  (Most recent Prior to Randomization)         Protocol # [id number]   Subject Initial ID#        10.4 Laboratory Assessments - Baseline Baseline Laboratory Assessments Performed? '[x]'  Yes '[]'   No  Lipid Panel Lipid Panel available?  '[x]'  Yes  '[]'   No Lipid Panel Date?       30-May-2022   Total Cholesterol Result?  _185__   Unit: '[x]'   mg/dL   '[]'  mmol/L     High Density Lipoprotein Cholesterol  (HDL-C) Result? ___33__         Unit: '[x]'   mg/dL   '[]'  mmol/L     Triglycerides?  __205____   Unit: '[x]'   mg/dL   '[]'  mmol/L               Low Density Lipoprotein Cholesterol (LDL-C)    111          Unit: '[x]'   mg/dL   '[]'  mmol/L           Apolipoprotein A1 (Apo A1)    '[]'   Yes  '[x]'   No   Result if yes:_____   Apolipoprotein B (ApoB)   '[]'   Yes  '[x]'   No    Result if yes:______   Lipoprotein(a) Available?   '[x]'   Yes  '[]'   No      Date:    01-Jun-2022      Result:  __81__      Unit:  nmol/L  Other Labs:   Creatinine Available?   '[x]'   Yes  '[]'   No      Date:     30-May-2022      Result:  _0.83___      Unit:  mg/dL   Estimated Glomerular Filtration Rate (eGFR) Available?   '[x]'   Yes  '[]'   No      Date:     30-May-2022      Result:  _60___      Unit:  mL/min/1.34m   Alanine Aminotransferase (ALT) / Serum Glutamic-Pyruvic Transaminase (SGPT)     Available?   '[]'   Yes  '[x]'   No       Date:     DD-MMM-YYYY      Result:  ____      Unit:  U/L      Normal Range: Lower Limit;    0      Normal Range: Upper Limit:   44   Aspartate Aminotransferase (AST) / Serum Glutamic-Oxaloacetic Transaminase (SGOT)     Available?   '[]'   Yes  [  x]  No       Date:     DD-MMM-YYYY      Result:  ____      Unit:  U/L      Normal Range: Lower Limit;   15      Normal Range: Upper Limit:   41   Total Bilirubin   Available?   '[]'   Yes  '[x]'   No       Date:     DD-MMM-YYYY      Result:  ____      Unit:  mg/dL       Normal Range: Lower Limit;   0.3      Normal Range: Upper Limit: 1.2      Glucose Available?   '[x]'   Yes  '[]'   No       Date: 30-May-2022      Result:  _234___      Unit:  mg/dL     Normal Range: Lower Limit;  70     Normal Range: Upper Limit: 99  Hemoglobin A1c (HbA1c) Available?   '[x]'   Yes  '[]'   No       Date:     30-May-2022      Result:  _6.5___  (%)       Hemoglobin Available?   '[x]'   Yes  '[]'   No    Date:  30-May-2022       Hgb Level __13.7___      Unit:  g/dL  White Blood Cell Count Available?   '[x]'   Yes   '[]'   No   Date:  30-May-2022      WBC count:  9.0____      Unit:  1000/mmm3  Platelet Available?   '[x]'   Yes  '[]'   No       Date:     30-May-2022       Platelet count:  __179__      Unit:  1000/mmm3      High-Sensitivity C-Reactive Protein (hsCRP).?'[]'   Yes  '[x]'   No      Result if Yes,  ____  Very Low Density Lipoprotein Cholesterol (LDL-C)? '[x]'  Yes  '[]'  No     Result if Yes, __41__        Protocol # [id number]   Subject ID#   [id number]                            DAY 1 Date:        01-Jun-2022  Randomization:   Geographic Region:    [place]  Randomization Date:     01-Jun-2022  Randomization Time:  09:06     Treatment type Assigned   '[]'  Treatment                                   '[x]'   Control              Current Outpatient Medications:    aspirin EC 81 MG tablet, Take 81 mg by mouth daily., Disp: , Rfl:    atorvastatin (LIPITOR) 80 MG tablet, Take 1 tablet (80 mg total) by mouth daily at 6 PM., Disp: 30 tablet, Rfl: 0   carvedilol (COREG) 6.25 MG tablet, Take 1 tablet (6.25 mg total) by mouth 2 (two) times daily with a meal., Disp: 60 tablet, Rfl: 0   Cholecalciferol (D3 HIGH POTENCY)  125 MCG (5000 UT) capsule, Take 5,000 Units by mouth daily., Disp: , Rfl:    clonazePAM (KLONOPIN) 1 MG tablet, Take 1 mg by mouth 2 (two) times daily., Disp: , Rfl:    clopidogrel (PLAVIX) 75 MG tablet, Take 1 tablet (75 mg total) by mouth daily., Disp: 30 tablet, Rfl: 0   ezetimibe (ZETIA) 10 MG tablet, Take 1 tablet by mouth once daily (Patient not taking: Reported on 05/30/2022), Disp: 30 tablet, Rfl: 0   FARXIGA 10 MG TABS tablet, Take 10 mg by mouth daily. , Disp: , Rfl:    Multiple Vitamins-Minerals (HAIR SKIN AND NAILS FORMULA PO), Take 2 capsules by mouth daily., Disp: , Rfl:    nitroGLYCERIN (NITROSTAT) 0.4 MG SL tablet, Place 0.4 mg under the tongue as needed for chest pain., Disp: , Rfl:    olmesartan (BENICAR) 20 MG tablet, Take 1 tablet (20 mg total) by mouth daily., Disp: 90 tablet,  Rfl: 3   omega-3 acid ethyl esters (LOVAZA) 1 g capsule, Take 2 capsules (2 g total) by mouth 2 (two) times daily. (Patient not taking: Reported on 05/30/2022), Disp: 360 capsule, Rfl: 3   OZEMPIC, 2 MG/DOSE, 8 MG/3ML SOPN, Inject 2 mg into the skin once a week., Disp: , Rfl:    pantoprazole (PROTONIX) 40 MG tablet, Take 1 tablet (40 mg total) by mouth daily., Disp: 30 tablet, Rfl: 0   potassium chloride SA (KLOR-CON M) 20 MEQ tablet, Take 2 tablets (40 mEq total) by mouth daily., Disp: 60 tablet, Rfl: 1   pregabalin (LYRICA) 150 MG capsule, Take 150 mg by mouth 2 (two) times daily., Disp: , Rfl:    sertraline (ZOLOFT) 100 MG tablet, Take 100 mg by mouth daily., Disp: , Rfl:    spironolactone (ALDACTONE) 25 MG tablet, TAKE 1/2 (ONE-HALF) TABLET BY MOUTH ONCE DAILY. MAY TAKE EXTRA 1/2 TABLET AS NEEDED IF SWELLING OCCURS. (Patient taking differently: Take 12.5 mg by mouth daily.), Disp: 30 tablet, Rfl: 3

## 2022-06-01 NOTE — Discharge Summary (Addendum)
Discharge Summary    Patient ID: Alice Bennett MRN: [id number]; DOB: 09/26/1961  Admit date: 05/30/2022 Discharge date: 06/01/2022  PCP:  Retia Passeose, Melissa S, NP   [facility] Providers Cardiologist:  Nicki Guadalajarahomas Kelly, MD     Discharge Diagnoses    Principal Problem:   NSTEMI (non-ST elevated myocardial infarction) [facility](HCC) Active Problems:   Hyperlipidemia   Hypertension   Tobacco use   Diagnostic Studies/Procedures    Cath: 05/31/22  Conclusions: Moderate two-vessel coronary artery disease with 50-60% mid LAD stenosis between two previously placed stents that is hemodynamically significant (RFR 0.89) and 50-60% proximal RCA stenosis that is borderline hemodynamically significant (RFR 0.90). Widely patent proximal and mid LAD stents. Low normal left ventricular systolic function (LVEF 50-55%). Moderately elevated left heart filling pressures (PCWP/LVEDP 25-30 mmHg). Mildly elevated right heart and pulmonary artery pressures (mean RA 9  mmHg, mean PA 25 mmHg). Low normal to mildly reduced Fick cardiac output (CO 5.3 L/min, CI 2.4 L/min/m). Successful RFR-guided PCI to mid LAD using Synergy 3.0 x 12 mm drug-eluting stent with 0% residual stenosis and TIMI-3 flow.   Recommendations: Continue indefinite dual antiplatelet therapy with aspirin and clopidogrel. Favor medical therapy of RCA disease.  Could consider PCI to proximal RCA if symptoms do not resolve despite aggressive antianginal and heart failure therapy. Gentle diuresis. Aggressive secondary prevention of coronary artery disease.   [name], MD [facility]  Diagnostic Dominance: Right  Intervention   Echo: 05/31/22  IMPRESSIONS     1. Mid/distal septal hypokinesis . Left ventricular ejection fraction, by  estimation, is 50 to 55%. The left ventricle has low normal function. The  left ventricle demonstrates regional wall motion abnormalities (see  scoring diagram/findings for  description). There is  mild left ventricular hypertrophy. Left ventricular  diastolic parameters were normal.   2. Right ventricular systolic function is normal. The right ventricular  size is normal. There is mildly elevated pulmonary artery systolic  pressure.   3. Left atrial size was moderately dilated.   4. The mitral valve is normal in structure. No evidence of mitral valve  regurgitation. No evidence of mitral stenosis.   5. The aortic valve is tricuspid. There is mild calcification of the  aortic valve. There is mild thickening of the aortic valve. Aortic valve  regurgitation is mild. Aortic valve sclerosis is present, with no evidence  of aortic valve stenosis.   6. The inferior vena cava is normal in size with greater than 50%  respiratory variability, suggesting right atrial pressure of 3 mmHg.   FINDINGS   Left Ventricle: Mid/distal septal hypokinesis. Left ventricular ejection  fraction, by estimation, is 50 to 55%. The left ventricle has low normal  function. The left ventricle demonstrates regional wall motion  abnormalities. The left ventricular internal   cavity size was normal in size. There is mild left ventricular  hypertrophy. Left ventricular diastolic parameters were normal.   Right Ventricle: The right ventricular size is normal. No increase in  right ventricular wall thickness. Right ventricular systolic function is  normal. There is mildly elevated pulmonary artery systolic pressure. The  tricuspid regurgitant velocity is 2.44   m/s, and with an assumed right atrial pressure of 15 mmHg, the estimated  right ventricular systolic pressure is 38.8 mmHg.   Left Atrium: Left atrial size was moderately dilated.   Right Atrium: Right atrial size was normal in size.   Pericardium: There is no evidence of pericardial effusion.   Mitral Valve: The mitral valve  is normal in structure. No evidence of  mitral valve regurgitation. No evidence of mitral valve stenosis.   Tricuspid Valve:  The tricuspid valve is normal in structure. Tricuspid  valve regurgitation is not demonstrated. No evidence of tricuspid  stenosis.   Aortic Valve: The aortic valve is tricuspid. There is mild calcification  of the aortic valve. There is mild thickening of the aortic valve. Aortic  valve regurgitation is mild. Aortic regurgitation PHT measures 456 msec.  Aortic valve sclerosis is present,   with no evidence of aortic valve stenosis. Aortic valve peak gradient  measures 12.0 mmHg.   Pulmonic Valve: The pulmonic valve was normal in structure. Pulmonic valve  regurgitation is not visualized. No evidence of pulmonic stenosis.   Aorta: The aortic root is normal in size and structure.   Venous: The inferior vena cava is normal in size with greater than 50%  respiratory variability, suggesting right atrial pressure of 3 mmHg.   IAS/Shunts: No atrial level shunt detected by color flow Doppler.  _____________   History of Present Illness     Alice Bennett is a 61 y.o. female with past medical history of CAD with prior NSTEMI  (PCI in 2014 pLAD, mLAD PCI 2018 ), hypertension, diabetes, hyperlipidemia and Takotsubo cardiomyopathy who presented 05/30/2022 for evaluation of NSTEMI.  Underwent nuclear stress test in 2022 with septal defect.  She was placed on SGLT2, Lovaza and Zetia.  Presented to the ED on 7/23 with complaints of chest pain after her breath recent death 2 days prior.   In the ED/OSH patient received medications including ASA (full dose), heparin, ativan X1, nitro drip and had resolution of her symptoms..  Key labs notable for glucose 234, first hs troponin 577.  Key imaging includes CXR without effusions.   Cardiology called for evaluation. EKG: SR: 67 subtle anterolateral t wave flattening Tele: SR with rare PAC   She was admitted to cardiology further evaluation with plans for cardiac catheterization  Hospital Course     NSTEMI Prior PCI to LAD' 18, nonobstructive disease  in circumflex and RCA --High-sensitivity troponin 577>> 1576.  Underwent cardiac catheterization noted above with moderate two-vessel CAD with 50 to 60% mid LAD stenosis between 2 previously placed stents that was felt to be hemodynamically significant with an RFR of 0.89.  Prior proximal and mid LAD stents widely patent.  Underwent successful RFR guided PCI to mid LAD with DES x1.  Recommendations for indefinite dual antiplatelet therapy with aspirin and Plavix.  Does have residual disease of 50 to 60% in proximal RCA which was borderline significant with recommendations for medical therapy unless she has recurrent anginal symptoms.  Seen by cardiac rehab --Continue aspirin, Plavix, statin, beta-blocker, ARB   Hypertension: Stable --Continue Coreg 6.25 mg twice daily, Benicar 20 mg daily, Aldactone 12.5 mg daily   Hyperlipidemia: LDL 111 --Continue atorvastatin 80 mg daily, Zetia 10 mg daily, Lovaza 2 g twice daily --Consider lipid clinic referral at discharge (she was enrolled in the evolve trial)   Diabetes: Hemoglobin A1c 6.5 --Continue Farxiga, ozempic    History of Takotsubo cardiomyopathy: No significant volume overload noted on exam.  -- Continue Coreg 6.25 mg twice daily, Benicar 20 mg daily, Aldactone 12.5 mg daily -- Echocardiogram showed LVEF 50 to 55%, mild LVH, normal RV, moderately dilated left atrium, mid septal hypokinesis.  Hypokalemia/hypomagnesemia: K+ 3.2, mag 1.6.  Supplemented prior to discharge --BMP at follow-up   Tobacco use: Cessation advised  Right hip pain: mentioned during  admission she has been having episodes of right hip pain with walking, slightly diminished pedal pulse.  -- plan for outpatient LE dopplers/ABIs  General: Well developed, well nourished, female appearing in no acute distress. Head: Normocephalic, atraumatic.  Neck: Supple without bruits, JVD. Lungs:  Resp regular and unlabored, CTA. Heart: RRR, S1, S2, no S3, S4, or murmur; no  rub. Abdomen: Soft, non-tender, non-distended with normoactive bowel sounds. No hepatomegaly. No rebound/guarding. No obvious abdominal masses. Extremities: No clubbing, cyanosis,edema. Distal pedal pulses are 2+ bilaterally. Right radial cath site stable without bruising or hematoma Neuro: Alert and oriented X 3. Moves all extremities spontaneously. Psych: Normal affect.  Patient seen by Dr. [place] and deemed stable for discharge home.  Follow-up with office arranged.  Medications sent to patient's pharmacy of choice.  Educated by [employer].D. prior to discharge.  Did the patient have an acute coronary syndrome (MI, NSTEMI, STEMI, etc) this admission?:  Yes                               AHA/ACC Clinical Performance & Quality Measures: Aspirin prescribed? - Yes ADP Receptor Inhibitor (Plavix/Clopidogrel, Brilinta/Ticagrelor or Effient/Prasugrel) prescribed (includes medically managed patients)? - Yes Beta Blocker prescribed? - Yes High Intensity Statin (Lipitor 40-80mg  or Crestor 20-40mg ) prescribed? - Yes EF assessed during THIS hospitalization? - Yes For EF <40%, was ACEI/ARB prescribed? - Yes For EF <40%, Aldosterone Antagonist (Spironolactone or Eplerenone) prescribed? - Yes Cardiac Rehab Phase II ordered (including medically managed patients)? - Yes       The patient will be scheduled for a TOC follow up appointment in 10-14 days.  A message has been sent to the [facility] and Scheduling Pool at the office where the patient should be seen for follow up.  _____________  Discharge Vitals Blood pressure (!) 109/51, pulse (!) 58, temperature 98.3 F (36.8 C), temperature source Oral, resp. rate 16, height  (1.753 m), weight 102.7 kg, SpO2 98 %.  Filed Weights   05/30/22 1221 05/30/22 2045  Weight: 101.2 kg 102.7 kg    Labs & Radiologic Studies    CBC Recent Labs    05/31/22 0425 05/31/22 1055 05/31/22 1056 06/01/22 0153  WBC 8.6  --   --  9.2  HGB 12.3   < > 11.9* 12.7   HCT 36.9   < > 35.0* 37.2  MCV 86.2  --   --  84.2  PLT 158  --   --  187   < > = values in this interval not displayed.   Basic Metabolic Panel Recent Labs    57/84/69 0654 05/31/22 1055 05/31/22 1056 06/01/22 0153  NA 140   < > 143 139  K 3.6   < > 4.0 3.2*  CL 109  --   --  105  CO2 23  --   --  26  GLUCOSE 152*  --   --  145*  BUN 10  --   --  15  CREATININE 0.99  --   --  1.22*  CALCIUM 8.5*  --   --  8.5*  MG  --   --   --  1.6*   < > = values in this interval not displayed.   Liver Function Tests No results for input(s): "AST", "ALT", "ALKPHOS", "BILITOT", "PROT", "ALBUMIN" in the last 72 hours. No results for input(s): "LIPASE", "AMYLASE" in the last 72 hours. High Sensitivity Troponin:  Recent Labs  Lab 05/30/22 1225 05/30/22 1425  TROPONINIHS 577* 1,576*    BNP Invalid input(s): "POCBNP" D-Dimer No results for input(s): "DDIMER" in the last 72 hours. Hemoglobin A1C Recent Labs    05/30/22 1718  HGBA1C 6.5*   Fasting Lipid Panel Recent Labs    05/30/22 2330  CHOL 185  HDL 33*  LDLCALC 111*  TRIG 205*  CHOLHDL 5.6   Thyroid Function Tests No results for input(s): "TSH", "T4TOTAL", "T3FREE", "THYROIDAB" in the last 72 hours.  Invalid input(s): "FREET3" _____________  CARDIAC CATHETERIZATION  Result Date: 05/31/2022 Conclusions: Moderate two-vessel coronary artery disease with 50-60% mid LAD stenosis between two previously placed stents that is hemodynamically significant (RFR 0.89) and 50-60% proximal RCA stenosis that is borderline hemodynamically significant (RFR 0.90). Widely patent proximal and mid LAD stents. Low normal left ventricular systolic function (LVEF 50-55%). Moderately elevated left heart filling pressures (PCWP/LVEDP 25-30 mmHg). Mildly elevated right heart and pulmonary artery pressures (mean RA 9  mmHg, mean PA 25 mmHg). Low normal to mildly reduced Fick cardiac output (CO 5.3 L/min, CI 2.4 L/min/m). Successful RFR-guided PCI  to mid LAD using Synergy 3.0 x 12 mm drug-eluting stent with 0% residual stenosis and TIMI-3 flow.  Recommendations: Continue indefinite dual antiplatelet therapy with aspirin and clopidogrel. Favor medical therapy of RCA disease.  Could consider PCI to proximal RCA if symptoms do not resolve despite aggressive antianginal and heart failure therapy. Gentle diuresis. Aggressive secondary prevention of coronary artery disease. Yvonne Kendall, MD [facility]  ECHOCARDIOGRAM COMPLETE  Result Date: 05/31/2022    ECHOCARDIOGRAM REPORT   Patient Name:   NEIDY GUERRIERI Date of Exam: 05/31/2022 Medical Rec #:  [id number]       Height:       69.0 in Accession #:    [id number]      Weight:       226.4 lb Date of Birth:  11-Apr-1961       BSA:          2.178 m Patient Age:    60 years        BP:           121/59 mmHg Patient Gender: F               HR:           59 bpm. Exam Location:  Inpatient Procedure: 2D Echo, Cardiac Doppler and Color Doppler Indications:    Chest Pain  History:        Patient has prior history of Echocardiogram examinations, most                 recent 05/30/2020. CAD; Risk Factors:Diabetes and Hypertension.  Sonographer:    Eduard Roux Referring Phys: [phone] HAO MENG IMPRESSIONS  1. Mid/distal septal hypokinesis . Left ventricular ejection fraction, by estimation, is 50 to 55%. The left ventricle has low normal function. The left ventricle demonstrates regional wall motion abnormalities (see scoring diagram/findings for description). There is mild left ventricular hypertrophy. Left ventricular diastolic parameters were normal.  2. Right ventricular systolic function is normal. The right ventricular size is normal. There is mildly elevated pulmonary artery systolic pressure.  3. Left atrial size was moderately dilated.  4. The mitral valve is normal in structure. No evidence of mitral valve regurgitation. No evidence of mitral stenosis.  5. The aortic valve is tricuspid. There is mild  calcification of the aortic valve. There is mild thickening of the aortic valve. Aortic valve  regurgitation is mild. Aortic valve sclerosis is present, with no evidence of aortic valve stenosis.  6. The inferior vena cava is normal in size with greater than 50% respiratory variability, suggesting right atrial pressure of 3 mmHg. FINDINGS  Left Ventricle: Mid/distal septal hypokinesis. Left ventricular ejection fraction, by estimation, is 50 to 55%. The left ventricle has low normal function. The left ventricle demonstrates regional wall motion abnormalities. The left ventricular internal  cavity size was normal in size. There is mild left ventricular hypertrophy. Left ventricular diastolic parameters were normal. Right Ventricle: The right ventricular size is normal. No increase in right ventricular wall thickness. Right ventricular systolic function is normal. There is mildly elevated pulmonary artery systolic pressure. The tricuspid regurgitant velocity is 2.44  m/s, and with an assumed right atrial pressure of 15 mmHg, the estimated right ventricular systolic pressure is 38.8 mmHg. Left Atrium: Left atrial size was moderately dilated. Right Atrium: Right atrial size was normal in size. Pericardium: There is no evidence of pericardial effusion. Mitral Valve: The mitral valve is normal in structure. No evidence of mitral valve regurgitation. No evidence of mitral valve stenosis. Tricuspid Valve: The tricuspid valve is normal in structure. Tricuspid valve regurgitation is not demonstrated. No evidence of tricuspid stenosis. Aortic Valve: The aortic valve is tricuspid. There is mild calcification of the aortic valve. There is mild thickening of the aortic valve. Aortic valve regurgitation is mild. Aortic regurgitation PHT measures 456 msec. Aortic valve sclerosis is present,  with no evidence of aortic valve stenosis. Aortic valve peak gradient measures 12.0 mmHg. Pulmonic Valve: The pulmonic valve was normal in  structure. Pulmonic valve regurgitation is not visualized. No evidence of pulmonic stenosis. Aorta: The aortic root is normal in size and structure. Venous: The inferior vena cava is normal in size with greater than 50% respiratory variability, suggesting right atrial pressure of 3 mmHg. IAS/Shunts: No atrial level shunt detected by color flow Doppler.  LEFT VENTRICLE PLAX 2D LVIDd:         5.70 cm   Diastology LVIDs:         4.10 cm   LV e' medial:    5.87 cm/s LV PW:         1.20 cm   LV E/e' medial:  14.0 LV IVS:        1.40 cm   LV e' lateral:   8.60 cm/s LVOT diam:     2.00 cm   LV E/e' lateral: 9.5 LV SV:         89 LV SV Index:   41 LVOT Area:     3.14 cm  RIGHT VENTRICLE             IVC RV S prime:     10.20 cm/s  IVC diam: 2.40 cm TAPSE (M-mode): 2.2 cm LEFT ATRIUM             Index        RIGHT ATRIUM           Index LA diam:        4.60 cm 2.11 cm/m   RA Area:     14.80 cm LA Vol (A2C):   66.0 ml 30.31 ml/m  RA Volume:   36.40 ml  16.71 ml/m LA Vol (A4C):   60.9 ml 27.96 ml/m LA Biplane Vol: 65.4 ml 30.03 ml/m  AORTIC VALVE                 PULMONIC VALVE AV Area (  Vmax): 2.34 cm     PV Vmax:       1.05 m/s AV Vmax:        173.00 cm/s  PV Peak grad:  4.4 mmHg AV Peak Grad:   12.0 mmHg LVOT Vmax:      129.00 cm/s LVOT Vmean:     70.400 cm/s LVOT VTI:       0.282 m AI PHT:         456 msec  AORTA Ao Root diam: 3.60 cm Ao Asc diam:  3.60 cm MITRAL VALVE               TRICUSPID VALVE MV Area (PHT): 3.30 cm    TR Peak grad:   23.8 mmHg MV Decel Time: 230 msec    TR Vmax:        244.00 cm/s MV E velocity: 82.00 cm/s MV A velocity: 58.80 cm/s  SHUNTS MV E/A ratio:  1.39        Systemic VTI:  0.28 m                            Systemic Diam: 2.00 cm Charlton Haws MD Electronically signed by Charlton Haws MD Signature Date/Time: 05/31/2022/10:38:01 AM    Final    DG Chest 2 View  Result Date: 05/30/2022 CLINICAL DATA:  Chest pain since yesterday. EXAM: CHEST - 2 VIEW COMPARISON:  02/17/2021 FINDINGS: Lungs  are adequately inflated without focal airspace consolidation or effusion. Cardiomediastinal silhouette and remainder of the exam is unchanged. IMPRESSION: No active cardiopulmonary disease. Electronically Signed   By: Elberta Fortis M.D.   On: 05/30/2022 13:49    Disposition   Pt is being discharged home today in good condition.  Follow-up Plans & Appointments     Follow-up Information     Cannon Kettle, PA-C Follow up on 06/09/2022.   Specialty: Internal Medicine Why: at 8:25am for your follow up appt with Dr. Ellin Goodie' PA Esaw Dace information: [address] [place] [address] [place] [address] [phone]                Discharge Instructions     AMB Referral to Advanced Lipid Disorders Clinic   Complete by: As directed    Internal Lipid Clinic Referral Scheduling  Internal lipid clinic referrals are providers within [facility], who wish to refer established patients for routine management (help in starting PCSK9 inhibitor therapy) or advanced therapies.  Internal MD referral criteria:              1. All patients with LDL>190 mg/dL  2. All patients with Triglycerides >500 mg/dL  3. Patients with suspected or confirmed heterozygous familial hyperlipidemia (HeFH) or homozygous familial hyperlipidemia (HoFH)  4. Patients with family history of suspicious for genetic dyslipidemia desiring genetic testing  5. Patients refractory to standard guideline based therapy  6. Patients with statin intolerance (failed 2 statins, one of which must be a high potency statin)  7. Patients who the provider desires to be seen by MD   Internal PharmD referral criteria:   1. Follow-up patients for medication management  2. Follow-up for compliance monitoring  3. Patients for drug education  4. Patients with statin intolerance  5. PCSK9 inhibitor education and prior authorization approvals  6. Patients with triglycerides <500 mg/dL  External Lipid Clinic Referral  External lipid  clinic referrals are for providers outside of [facility], considered new clinic patients - automatically routed to  MD schedule   Amb Referral to Cardiac Rehabilitation   Complete by: As directed    Diagnosis: Coronary Stents   After initial evaluation and assessments completed: Virtual Based Care may be provided alone or in conjunction with Phase 2 Cardiac Rehab based on patient barriers.: Yes   Call MD for:  difficulty breathing, headache or visual disturbances   Complete by: As directed    Call MD for:  persistant dizziness or light-headedness   Complete by: As directed    Call MD for:  redness, tenderness, or signs of infection (pain, swelling, redness, odor or green/yellow discharge around incision site)   Complete by: As directed    Diet - low sodium heart healthy   Complete by: As directed    Discharge instructions   Complete by: As directed    Radial Site Care Refer to this sheet in the next few weeks. These instructions provide you with information on caring for yourself after your procedure. Your caregiver may also give you more specific instructions. Your treatment has been planned according to current medical practices, but problems sometimes occur. Call your caregiver if you have any problems or questions after your procedure. HOME CARE INSTRUCTIONS You may shower the day after the procedure. Remove the bandage (dressing) and gently wash the site with plain soap and water. Gently pat the site dry.  Do not apply powder or lotion to the site.  Do not submerge the affected site in water for 3 to 5 days.  Inspect the site at least twice daily.  Do not flex or bend the affected arm for 24 hours.  No lifting over 5 pounds (2.3 kg) for 5 days after your procedure.  Do not drive home if you are discharged the same day of the procedure. Have someone else drive you.  You may drive 24 hours after the procedure unless otherwise instructed by your caregiver.  What to expect: Any  bruising will usually fade within 1 to 2 weeks.  Blood that collects in the tissue (hematoma) may be painful to the touch. It should usually decrease in size and tenderness within 1 to 2 weeks.  SEEK IMMEDIATE MEDICAL CARE IF: You have unusual pain at the radial site.  You have redness, warmth, swelling, or pain at the radial site.  You have drainage (other than a small amount of blood on the dressing).  You have chills.  You have a fever or persistent symptoms for more than 72 hours.  You have a fever and your symptoms suddenly get worse.  Your arm becomes pale, cool, tingly, or numb.  You have heavy bleeding from the site. Hold pressure on the site.   PLEASE DO NOT MISS ANY DOSES OF YOUR PLAVIX!!!!! Also keep a log of you blood pressures and bring back to your follow up appt. Please call the office with any questions.   Patients taking blood thinners should generally stay away from medicines like ibuprofen, Advil, Motrin, naproxen, and Aleve due to risk of stomach bleeding. You may take Tylenol as directed or talk to your primary doctor about alternatives.  PLEASE ENSURE THAT YOU DO NOT RUN OUT OF YOUR PLAVIX. This medication is very important to remain on these medications. IF you have issues obtaining this medication due to cost please CALL the office 3-5 business days prior to running out in order to prevent missing doses of this medication.   Increase activity slowly   Complete by: As directed  Discharge Medications   Allergies as of 06/01/2022       Reactions   Codeine Itching        Medication List     TAKE these medications    aspirin EC 81 MG tablet Take 81 mg by mouth daily.   atorvastatin 80 MG tablet Commonly known as: LIPITOR Take 1 tablet (80 mg total) by mouth daily at 6 PM.   carvedilol 6.25 MG tablet Commonly known as: COREG Take 1 tablet (6.25 mg total) by mouth 2 (two) times daily with a meal.   clonazePAM 1 MG tablet Commonly known as:  KLONOPIN Take 1 mg by mouth 2 (two) times daily.   clopidogrel 75 MG tablet Commonly known as: PLAVIX Take 1 tablet (75 mg total) by mouth daily.   D3 High Potency 125 MCG (5000 UT) capsule Generic drug: Cholecalciferol Take 5,000 Units by mouth daily.   ezetimibe 10 MG tablet Commonly known as: ZETIA Take 1 tablet by mouth once daily   Farxiga 10 MG Tabs tablet Generic drug: dapagliflozin propanediol Take 10 mg by mouth daily.   HAIR SKIN AND NAILS FORMULA PO Take 2 capsules by mouth daily.   nitroGLYCERIN 0.4 MG SL tablet Commonly known as: NITROSTAT Place 0.4 mg under the tongue as needed for chest pain.   olmesartan 20 MG tablet Commonly known as: BENICAR Take 1 tablet (20 mg total) by mouth daily.   omega-3 acid ethyl esters 1 g capsule Commonly known as: Lovaza Take 2 capsules (2 g total) by mouth 2 (two) times daily.   Ozempic (2 MG/DOSE) 8 MG/3ML Sopn Generic drug: Semaglutide (2 MG/DOSE) Inject 2 mg into the skin once a week.   pantoprazole 40 MG tablet Commonly known as: PROTONIX Take 1 tablet (40 mg total) by mouth daily.   pregabalin 150 MG capsule Commonly known as: LYRICA Take 150 mg by mouth 2 (two) times daily.   sertraline 100 MG tablet Commonly known as: ZOLOFT Take 100 mg by mouth daily.   spironolactone 25 MG tablet Commonly known as: ALDACTONE TAKE 1/2 (ONE-HALF) TABLET BY MOUTH ONCE DAILY. MAY TAKE EXTRA 1/2 TABLET AS NEEDED IF SWELLING OCCURS. What changed: See the new instructions.        Outstanding Labs/Studies   BMET at follow up appt LE dopplers/ABIs   Duration of Discharge Encounter   Greater than 30 minutes including physician time.  Signed, Laverda Page, NP 06/01/2022, 10:46 AM

## 2022-06-02 ENCOUNTER — Telehealth: Payer: Self-pay

## 2022-06-02 ENCOUNTER — Ambulatory Visit: Payer: Medicaid Other | Admitting: Cardiovascular Disease

## 2022-06-02 MED ORDER — POTASSIUM CHLORIDE CRYS ER 20 MEQ PO TBCR: 40.0000 meq | EXTENDED_RELEASE_TABLET | Freq: Every day | ORAL | 1 refills | Status: DC

## 2022-06-02 NOTE — Telephone Encounter (Signed)
The patient has been notified of the result and verbalized understanding.  All questions (if any) were answered. Macie Burows, RN 06/02/2022 12:15 PM

## 2022-06-02 NOTE — Telephone Encounter (Signed)
-----   Message from Christell Constant, MD sent at 06/02/2022 11:16 AM EDT ----- Results: Low K Creatinine WNL Plan: K 40 mg PO daily, at upcoming follow up may be able to increase aldactone  Christell Constant, MD

## 2022-06-08 NOTE — Progress Notes (Unsigned)
Cardiology Office Note:    Date:  06/09/2022   ID:  Nile Dear, DOB Jun 02, 1961, MRN [id number]  PCP:  [facility] Cardiologist: Nicki Guadalajara, MD   Reason for visit: s/p PCI  History of Present Illness:    Alice Bennett is a 61 y.o. female with a hx of CAD status post BMS to prox/mid LAD in 2014, Takotsubo syndrome 2018 - LHC showed patent LAD stent and nonobstructive disease, diabetes with associated peripheral neuropathy, hypertension and hyperlipidemia.  She also Dr. Tresa Endo in May 2022.  Secondary to dry cough, her lisinopril was changed to olmesartan.  Presented to the ED on 7/23 with complaints of chest pain after her recent death  in her family.  +NSTEMI.  LHC showed moderate two-vessel CAD with 50 to 60% mid LAD stenosis between 2 previously placed stents that was felt to be hemodynamically significant with an RFR of 0.89.  Prior proximal and mid LAD stents widely patent.  Underwent successful RFR guided PCI to mid LAD with DES x1.  Recommendations for indefinite dual antiplatelet therapy with aspirin and Plavix.  Does have residual disease of 50 to 60% in proximal RCA which was borderline significant with recommendations for medical therapy unless she has recurrent anginal symptoms.  Tobacco cessation advised.  Echo showed LVEF 50 to 55%, mild LVH, normal RV, moderately dilated left atrium, mid septal hypokinesis.  Today, she discusses the "ton of bricks" on her chest that prompted her to go to the ED 2 days after her brother's death.  Chest pain resolved post PCI.  No SOB, PND, orthopnea, LE edema.  No bleeding issues with DAPT.  Feels slightly tired.  She is down from 1/2 pack/day to 3 cigarettes/day.  Her only concern is right hip pain with ambulation -this is limiting her walking.  She used to go to the flea market every weekend which is now difficult for her.  She denies calf and thigh pain.  No resting pain.  No leg wounds.    Past  Medical History:  Diagnosis Date   CAD (coronary artery disease)    07/10/17 PCI/DES to LAD, EF 35-40%   Diabetes mellitus without complication (HCC)    Hyperlipidemia    Hypertension    NSTEMI (non-ST elevated myocardial infarction) [facility])     Past Surgical History:  Procedure Laterality Date   CORONARY STENT INTERVENTION N/A 07/10/2017   Procedure: CORONARY STENT INTERVENTION;  Surgeon: Swaziland, Peter M, MD;  Location: [facility] INVASIVE CV LAB;  Service: Cardiovascular;  Laterality: N/A;   CORONARY STENT INTERVENTION N/A 05/31/2022   Procedure: CORONARY STENT INTERVENTION;  Surgeon: Yvonne Kendall, MD;  Location: [facility] INVASIVE CV LAB;  Service: Cardiovascular;  Laterality: N/A;   INTRAVASCULAR PRESSURE WIRE/FFR STUDY N/A 07/10/2017   Procedure: INTRAVASCULAR PRESSURE WIRE/FFR STUDY;  Surgeon: Swaziland, Peter M, MD;  Location: [facility] INVASIVE CV LAB;  Service: Cardiovascular;  Laterality: N/A;   INTRAVASCULAR PRESSURE WIRE/FFR STUDY N/A 05/31/2022   Procedure: INTRAVASCULAR PRESSURE WIRE/FFR STUDY;  Surgeon: Yvonne Kendall, MD;  Location: [facility] INVASIVE CV LAB;  Service: Cardiovascular;  Laterality: N/A;   LEFT HEART CATH AND CORONARY ANGIOGRAPHY N/A 07/10/2017   Procedure: LEFT HEART CATH AND CORONARY ANGIOGRAPHY;  Surgeon: Swaziland, Peter M, MD;  Location: [facility] INVASIVE CV LAB;  Service: Cardiovascular;  Laterality: N/A;   RIGHT/LEFT HEART CATH AND CORONARY ANGIOGRAPHY N/A 05/31/2022   Procedure: RIGHT/LEFT HEART CATH AND CORONARY ANGIOGRAPHY;  Surgeon: Yvonne Kendall, MD;  Location: [facility] INVASIVE CV LAB;  Service: Cardiovascular;  Laterality: N/A;    Current Medications: Current Meds  Medication Sig   aspirin EC 81 MG tablet Take 81 mg by mouth daily.   atorvastatin (LIPITOR) 80 MG tablet Take 1 tablet (80 mg total) by mouth daily at 6 PM.   carvedilol (COREG) 6.25 MG tablet Take 1 tablet (6.25 mg total) by mouth 2 (two) times daily with a meal.   Cholecalciferol (D3 HIGH POTENCY) 125 MCG (5000 UT) capsule  Take 5,000 Units by mouth daily.   clonazePAM (KLONOPIN) 1 MG tablet Take 1 mg by mouth 2 (two) times daily.   clopidogrel (PLAVIX) 75 MG tablet Take 1 tablet (75 mg total) by mouth daily.   ezetimibe (ZETIA) 10 MG tablet Take 1 tablet by mouth once daily   FARXIGA 10 MG TABS tablet Take 10 mg by mouth daily.    Multiple Vitamins-Minerals (HAIR SKIN AND NAILS FORMULA PO) Take 2 capsules by mouth daily.   nitroGLYCERIN (NITROSTAT) 0.4 MG SL tablet Place 0.4 mg under the tongue as needed for chest pain.   olmesartan (BENICAR) 20 MG tablet Take 1 tablet (20 mg total) by mouth daily.   omega-3 acid ethyl esters (LOVAZA) 1 g capsule Take 2 capsules (2 g total) by mouth 2 (two) times daily.   OZEMPIC, 2 MG/DOSE, 8 MG/3ML SOPN Inject 2 mg into the skin once a week.   pantoprazole (PROTONIX) 40 MG tablet Take 1 tablet (40 mg total) by mouth daily.   pregabalin (LYRICA) 150 MG capsule Take 150 mg by mouth 2 (two) times daily.   sertraline (ZOLOFT) 100 MG tablet Take 100 mg by mouth daily.   spironolactone (ALDACTONE) 25 MG tablet Take 1 tablet (25 mg total) by mouth daily.   [DISCONTINUED] potassium chloride SA (KLOR-CON M) 20 MEQ tablet Take 2 tablets (40 mEq total) by mouth daily.   [DISCONTINUED] spironolactone (ALDACTONE) 25 MG tablet TAKE 1/2 (ONE-HALF) TABLET BY MOUTH ONCE DAILY. MAY TAKE EXTRA 1/2 TABLET AS NEEDED IF SWELLING OCCURS. (Patient taking differently: Take 12.5 mg by mouth daily.)     Allergies:   Codeine   Social History   Socioeconomic History   Marital status: Married    Spouse name: Not on file   Number of children: Not on file   Years of education: Not on file   Highest education level: Not on file  Occupational History   Not on file  Tobacco Use   Smoking status: Every Day    Packs/day: 0.50    Years: 10.00    Total pack years: 5.00    Types: Cigarettes   Smokeless tobacco: Never  Vaping Use   Vaping Use: Never used  Substance and Sexual Activity   Alcohol use:  No   Drug use: No   Sexual activity: Not on file  Other Topics Concern   Not on file  Social History Narrative   Not on file   Social Determinants of Health   Financial Resource Strain: Not on file  Food Insecurity: Not on file  Transportation Needs: Not on file  Physical Activity: Not on file  Stress: Not on file  Social Connections: Not on file     Family History: The patient's family history is not on file.  ROS:   Please see the history of present illness.     EKGs/Labs/Other Studies Reviewed:    Recent Labs: 06/01/2022: BUN 15; Creatinine, Ser 1.22; Hemoglobin 12.7; Magnesium 1.6; Platelets 187; Potassium 3.2; Sodium 139   Recent Lipid  Panel Lab Results  Component Value Date/Time   CHOL 185 05/30/2022 11:30 PM   CHOL 207 (H) 05/30/2020 12:23 PM   TRIG 205 (H) 05/30/2022 11:30 PM   HDL 33 (L) 05/30/2022 11:30 PM   HDL 35 (L) 05/30/2020 12:23 PM   LDLCALC 111 (H) 05/30/2022 11:30 PM   LDLCALC 123 (H) 05/30/2020 12:23 PM    Physical Exam:    VS:  BP 110/73   Pulse 70   Ht 5\' 9"  (1.753 m)   Wt 221 lb 1.6 oz (100.3 kg)   SpO2 98%   BMI 32.65 kg/m    No data found.       Wt Readings from Last 3 Encounters:  06/09/22 221 lb 1.6 oz (100.3 kg)  05/30/22 226 lb 6.6 oz (102.7 kg)  03/09/21 243 lb 3.2 oz (110.3 kg)     GEN:  Well nourished, well developed in no acute distress HEENT: Normal NECK: No JVD; No carotid bruits CARDIAC: RRR, no murmurs, rubs, gallops RESPIRATORY:  Clear to auscultation without rales, wheezing or rhonchi  ABDOMEN: Soft, non-tender, non-distended MUSCULOSKELETAL: No edema; No deformity  SKIN: Warm and dry NEUROLOGIC:  Alert and oriented PSYCHIATRIC:  Normal affect     ASSESSMENT AND PLAN   Coronary artery disease, no angina -DES to LAD in 2018 -NSTEMI 05/2022 with PCI in between prior LAD stents; residual 50-60% RCA disease -- tx medically unless recurrent anginal symptoms -Recommended to continue indefinite DAPT with  aspirin & Plavix. -Continue statin, beta-blocker, ARB and Farxiga. -Recommend cardiac rehab. -Recommend tobacco cessation. -Follow-up with Pharm.D. appointment for further lipid lowering.  Right hip claudication -mentioned during admission she has been having episodes of right hip pain with walking -plan LE dopplers/ABIs -Continue antiplatelets, statin therapy and tobacco cessation efforts. -Continue walking as tolerated.  History of Takotsubo's cardiomyopathy, euvolemic -EF in 2018 was 35 to 40% -EF 05/2022: 50 to 55%, mid septal hypokinesis -Continue Coreg & olmesartan. -Increase spironolactone secondary to hypokalemia.  Stop potassium supplement.  Check BMET in 2 weeks.  Hypertension, well controlled -Meds as above.  Hyperlipidemia -LDL 111 -Continue atorvastatin 80 mg daily, Zetia 10 mg daily, Lovaza 2 g twice daily -Keep lipid clinic referral at discharge (she was enrolled in the evolve trial). -Discussed cholesterol lowering diets - Mediterranean diet, DASH diet, vegetarian diet, low-carbohydrate diet and avoidance of trans fats.  Discussed healthier choice substitutes.  Nuts, high-fiber foods, and fiber supplements may also improve lipids.    Obesity -Recommend moderate intensity activity for 30 minutes 5 days/week and the DASH diet.  Disposition - Follow-up in 4 months with Dr. 06/2022.   Medication Adjustments/Labs and Tests Ordered: Current medicines are reviewed at length with the patient today.  Concerns regarding medicines are outlined above.  Orders Placed This Encounter  Procedures   Basic metabolic panel   VAS Tresa Endo ABI WITH/WO TBI   Meds ordered this encounter  Medications   spironolactone (ALDACTONE) 25 MG tablet    Sig: Take 1 tablet (25 mg total) by mouth daily.    Dispense:  90 tablet    Refill:  3    Patient Instructions  Medication Instructions:  Increase Spironolactone from 12.5 mg to 25 mg (Take 1 Tablet Daily). *If you need a refill on your  cardiac medications before your next appointment, please call your pharmacy*   Lab Work: BMET 2 Weeks If you have labs (blood work) drawn today and your tests are completely normal, you will receive your results only by: MyChart  Message (if you have MyChart) OR A paper copy in the mail If you have any lab test that is abnormal or we need to change your treatment, we will call you to review the results.   Testing/Procedures: [address], [address]. Your physician has requested that you have an ankle brachial index (ABI). During this test an ultrasound and blood pressure cuff are used to evaluate the arteries that supply the arms and legs with blood. Allow thirty minutes for this exam. There are no restrictions or special instructions.    Follow-Up: At [facility], you and your health needs are our priority.  As part of our continuing mission to provide you with exceptional heart care, we have created designated Provider Care Teams.  These Care Teams include your primary Cardiologist (physician) and Advanced Practice Providers (APPs -  Physician Assistants and Nurse Practitioners) who all work together to provide you with the care you need, when you need it.   Your next appointment:   4 month(s)  The format for your next appointment:   In Person  Provider:   Nicki Guadalajara, MD       Signed, Cannon Kettle, PA-C  06/09/2022 9:08 AM    [facility]

## 2022-06-09 ENCOUNTER — Encounter: Payer: Self-pay | Admitting: Physician Assistant

## 2022-06-09 ENCOUNTER — Ambulatory Visit (INDEPENDENT_AMBULATORY_CARE_PROVIDER_SITE_OTHER): Payer: Medicaid Other | Admitting: Physician Assistant

## 2022-06-09 VITALS — BP 110/73 | HR 70 | Ht 69.0 in | Wt 221.1 lb

## 2022-06-09 DIAGNOSIS — E785 Hyperlipidemia, unspecified: Secondary | ICD-10-CM | POA: Diagnosis not present

## 2022-06-09 DIAGNOSIS — I25119 Atherosclerotic heart disease of native coronary artery with unspecified angina pectoris: Secondary | ICD-10-CM | POA: Diagnosis not present

## 2022-06-09 DIAGNOSIS — I5181 Takotsubo syndrome: Secondary | ICD-10-CM | POA: Diagnosis not present

## 2022-06-09 DIAGNOSIS — I10 Essential (primary) hypertension: Secondary | ICD-10-CM | POA: Diagnosis not present

## 2022-06-09 DIAGNOSIS — Z720 Tobacco use: Secondary | ICD-10-CM

## 2022-06-09 MED ORDER — SPIRONOLACTONE 25 MG PO TABS: 25.0000 mg | ORAL_TABLET | Freq: Every day | ORAL | 3 refills | Status: DC

## 2022-06-09 NOTE — Patient Instructions (Addendum)
Medication Instructions:  Increase Spironolactone from 12.5 mg to 25 mg (Take 1 Tablet Daily). Stop Potassium. *If you need a refill on your cardiac medications before your next appointment, please call your pharmacy*   Lab Work: BMET 2 Weeks If you have labs (blood work) drawn today and your tests are completely normal, you will receive your results only by: MyChart Message (if you have MyChart) OR A paper copy in the mail If you have any lab test that is abnormal or we need to change your treatment, we will call you to review the results.   Testing/Procedures: [address], [address]. Your physician has requested that you have an ankle brachial index (ABI). During this test an ultrasound and blood pressure cuff are used to evaluate the arteries that supply the arms and legs with blood. Allow thirty minutes for this exam. There are no restrictions or special instructions.    Follow-Up: At [facility], you and your health needs are our priority.  As part of our continuing mission to provide you with exceptional heart care, we have created designated Provider Care Teams.  These Care Teams include your primary Cardiologist (physician) and Advanced Practice Providers (APPs -  Physician Assistants and Nurse Practitioners) who all work together to provide you with the care you need, when you need it.   Your next appointment:   4 month(s)  The format for your next appointment:   In Person  Provider:   Nicki Guadalajara, MD

## 2022-06-10 ENCOUNTER — Other Ambulatory Visit: Payer: Self-pay | Admitting: Physician Assistant

## 2022-06-10 DIAGNOSIS — I10 Essential (primary) hypertension: Secondary | ICD-10-CM

## 2022-06-10 DIAGNOSIS — I5181 Takotsubo syndrome: Secondary | ICD-10-CM

## 2022-06-10 DIAGNOSIS — E785 Hyperlipidemia, unspecified: Secondary | ICD-10-CM

## 2022-06-10 DIAGNOSIS — I739 Peripheral vascular disease, unspecified: Secondary | ICD-10-CM

## 2022-06-10 DIAGNOSIS — Z720 Tobacco use: Secondary | ICD-10-CM

## 2022-06-13 ENCOUNTER — Other Ambulatory Visit: Payer: Self-pay | Admitting: Cardiovascular Disease

## 2022-06-16 ENCOUNTER — Ambulatory Visit (HOSPITAL_COMMUNITY)
Admission: RE | Admit: 2022-06-16 | Discharge: 2022-06-16 | Disposition: A | Discharge status: Home / Self Care | Payer: Medicaid Other | Source: Ambulatory Visit | Attending: Cardiovascular Disease | Admitting: Cardiovascular Disease

## 2022-06-16 DIAGNOSIS — I5181 Takotsubo syndrome: Secondary | ICD-10-CM | POA: Insufficient documentation

## 2022-06-16 DIAGNOSIS — I10 Essential (primary) hypertension: Secondary | ICD-10-CM | POA: Diagnosis present

## 2022-06-16 DIAGNOSIS — Z720 Tobacco use: Secondary | ICD-10-CM | POA: Diagnosis present

## 2022-06-16 DIAGNOSIS — I739 Peripheral vascular disease, unspecified: Secondary | ICD-10-CM | POA: Insufficient documentation

## 2022-06-16 DIAGNOSIS — E785 Hyperlipidemia, unspecified: Secondary | ICD-10-CM | POA: Diagnosis present

## 2022-06-17 ENCOUNTER — Telehealth: Payer: Self-pay

## 2022-06-17 DIAGNOSIS — I739 Peripheral vascular disease, unspecified: Secondary | ICD-10-CM

## 2022-06-17 LAB — BASIC METABOLIC PANEL
BUN/Creatinine Ratio: 15 (ref 12–28)
BUN: 15 mg/dL (ref 8–27)
CO2: 23 mmol/L (ref 20–29)
Calcium: 9.7 mg/dL (ref 8.7–10.3)
Chloride: 102 mmol/L (ref 96–106)
Creatinine, Ser: 1.03 mg/dL — ABNORMAL HIGH (ref 0.57–1.00)
Glucose: 122 mg/dL — ABNORMAL HIGH (ref 70–99)
Potassium: 4.7 mmol/L (ref 3.5–5.2)
Sodium: 141 mmol/L (ref 134–144)
eGFR: 62 mL/min/{1.73_m2} (ref >59)

## 2022-06-17 NOTE — Telephone Encounter (Addendum)
Called patient regarding results. Left message for patient to call office.----- Message from Cannon Kettle, PA-C sent at 06/17/2022  3:19 PM EDT ----- See duplex results - pt with right common iliac stenosis.  Refer pt to Dr. Allyson Sabal.

## 2022-06-17 NOTE — Telephone Encounter (Addendum)
Called patient regarding results. Left message for patient to call office.----- Message from Cannon Kettle, PA-C sent at 06/17/2022  3:27 PM EDT ----- Good news -kidney function improved and potassium now in normal range.  Continue current medications.

## 2022-06-18 ENCOUNTER — Telehealth: Payer: Self-pay

## 2022-06-18 NOTE — Telephone Encounter (Addendum)
Called patient regarding results. Patient had understanding of results.----- Message from Cannon Kettle, PA-C sent at 06/17/2022  3:17 PM EDT ----- Patient has significant right hip pain with walking, limiting her activities.   Duplex shows significant stenosis in the right common iliac artery.    Plan: Refer to Dr. Nanetta Batty with our peripheral vascular team. Judeth Cornfield -- please make appt with Dr. Allyson Sabal dx: PAD, right common iliac stenosis.  I have discussed with Dr. Allyson Sabal.

## 2022-06-18 NOTE — Telephone Encounter (Addendum)
Patient returned call regarding results. Patient understanding of results. Patient advised will be referred to Dr. Allyson Sabal for PAD.----- Message from Cannon Kettle, PA-C sent at 06/17/2022  3:19 PM EDT ----- See duplex results - pt with right common iliac stenosis.  Refer pt to Dr. Allyson Sabal.

## 2022-06-22 ENCOUNTER — Ambulatory Visit (INDEPENDENT_AMBULATORY_CARE_PROVIDER_SITE_OTHER): Payer: Medicaid Other | Admitting: Pharmacist Clinician (PhC)/ Clinical Pharmacy Specialist

## 2022-06-22 ENCOUNTER — Encounter: Payer: Self-pay | Admitting: Pharmacist Clinician (PhC)/ Clinical Pharmacy Specialist

## 2022-06-22 DIAGNOSIS — E785 Hyperlipidemia, unspecified: Secondary | ICD-10-CM | POA: Diagnosis not present

## 2022-06-22 MED ORDER — EZETIMIBE 10 MG PO TABS: 10.0000 mg | ORAL_TABLET | Freq: Every day | ORAL | 3 refills | Status: DC

## 2022-06-22 NOTE — Progress Notes (Signed)
06/22/2022 Alice Bennett [birth year] [id number]   HPI:  Alice Bennett is a 61 y.o. female patient of Dr Tresa Endo, who presents today for a lipid clinic evaluation.  See pertinent past medical history below.  Ms Martinez was hospitalized last month with NSTEMI and had DES to mid LAD.  Previously she had stenting to the LAD in 2016 and 2018.  The newest stent was placed right between the two previous ones.  She also had 55% stenosis of the proximal RCA, which is being treated medically.  She was enrolled in the EVOLVE-MI trial, which is comparing early treatment with evolocumab vs routine lipid management.  Clarified with research team and there are no limits on medication use for "routine lipid management".    Today in the office she has a bag with all her medications in it.  She has been on atorvastatin for several years, yet her bottle today is almost full and was filled by the pharmacy in late March.  Also, she does not have any ezetimibe in the bag.  States she has been taking all her meds, so not sure if she just ran out of the ezetimibe, or she didn't realize she was missing it.  She did note that she had quit taking the prescription fish oil product as she didn't like the fishy taste.   Past Medical History: CAD 7/23 - NSTEMI w/DES to mid LAD (between two previously placed stents) - other in 2016, 2018  Hypertension Stable on olmesartan, spironolactone, carvedilol  Tobacco abuse Has been advised to quit multiple times  DM2 7/23 A1c 6.5 - on Farxiga, Ozempic    Current Medications: atorvastatin 80, ezetimibe 10  Cholesterol Goals: LDL < 55  Family history: mother died multiple health issues; including MI died at 65; father multiple strokes, MI, died brain aneurysm around 55; sister diabetic w/o heart disease, brother with double amputation, ESRD; other brother died last month, aneurysm in stomach; 3 daughters, 1 son, = 2 with elevated BP   Diet: mix of home and eating out, likes  Lance Muss; no fried foods at home; likes vegetables f/f/c; variety of protein  Exercise:  none - can only walk short distances before pain sets in  Labs: 7/23 TC 185, TG 205, HLD 33, LDL 111   Current Outpatient Medications  Medication Sig Dispense Refill   aspirin EC 81 MG tablet Take 81 mg by mouth daily.     atorvastatin (LIPITOR) 80 MG tablet Take 1 tablet (80 mg total) by mouth daily at 6 PM. 30 tablet 0   carvedilol (COREG) 6.25 MG tablet Take 1 tablet (6.25 mg total) by mouth 2 (two) times daily with a meal. 60 tablet 0   Cholecalciferol (D3 HIGH POTENCY) 125 MCG (5000 UT) capsule Take 5,000 Units by mouth daily.     clonazePAM (KLONOPIN) 1 MG tablet Take 1 mg by mouth 2 (two) times daily.     clopidogrel (PLAVIX) 75 MG tablet Take 1 tablet (75 mg total) by mouth daily. 30 tablet 0   FARXIGA 10 MG TABS tablet Take 10 mg by mouth daily.      Multiple Vitamins-Minerals (HAIR SKIN AND NAILS FORMULA PO) Take 2 capsules by mouth daily.     nitroGLYCERIN (NITROSTAT) 0.4 MG SL tablet Place 0.4 mg under the tongue as needed for chest pain.     olmesartan (BENICAR) 20 MG tablet Take 1 tablet (20 mg total) by mouth daily. 90 tablet 3   pantoprazole (PROTONIX) 40 MG tablet  Take 1 tablet (40 mg total) by mouth daily. 30 tablet 0   pregabalin (LYRICA) 150 MG capsule Take 150 mg by mouth 2 (two) times daily.     sertraline (ZOLOFT) 100 MG tablet Take 100 mg by mouth daily.     spironolactone (ALDACTONE) 25 MG tablet Take 1 tablet (25 mg total) by mouth daily. 90 tablet 3   ezetimibe (ZETIA) 10 MG tablet Take 1 tablet (10 mg total) by mouth daily. 90 tablet 3   omega-3 acid ethyl esters (LOVAZA) 1 g capsule Take 2 capsules (2 g total) by mouth 2 (two) times daily. (Patient not taking: Reported on 06/22/2022) 360 capsule 3   OZEMPIC, 2 MG/DOSE, 8 MG/3ML SOPN Inject 2 mg into the skin once a week. (Patient not taking: Reported on 06/22/2022)     No current facility-administered medications for this  visit.    Allergies  Allergen Reactions   Codeine Itching    Past Medical History:  Diagnosis Date   CAD (coronary artery disease)    07/10/17 PCI/DES to LAD, EF 35-40%   Diabetes mellitus without complication (HCC)    Hyperlipidemia    Hypertension    NSTEMI (non-ST elevated myocardial infarction) (HCC)     There were no vitals taken for this visit.   Hyperlipidemia Patient with ASCVD, recent NSTEMI with DES to mid LAD and LDL not to goal.  LDL at time of event last month was 111.  Patient reported compliance with medications.  Reviewed options for lowering LDL cholesterol, including PCSK-9 inhibitors and bempedoic acid.  Discussed mechanisms of action, dosing, side effects and potential decreases in LDL cholesterol.  Also reviewed cost information.  Answered all patient questions.  Based on this information, patient would prefer to start PCSK9 inhibitor.  She has managed medicaid, so will send PA to determine coverage.  Once approved, she will need to do one injection every 14 days and repeat cholesterol labs after 4-6 doses.    Suggested she try putting fish oil in the refrigerator and taking the medication while cold to decrease fishy aftertaste.  If that doesn't work I asked that she please reach out to the office as we can try fenofibrate to get her LDL down without worrying about the fishy taste.     Phillips Hay PharmD CPP [facility] [address] [place], [place] [address] [phone]

## 2022-06-22 NOTE — Assessment & Plan Note (Addendum)
Patient with ASCVD, recent NSTEMI with DES to mid LAD and LDL not to goal.  LDL at time of event last month was 111.  Patient reported compliance with medications.  Reviewed options for lowering LDL cholesterol, including PCSK-9 inhibitors and bempedoic acid.  Discussed mechanisms of action, dosing, side effects and potential decreases in LDL cholesterol.  Also reviewed cost information.  Answered all patient questions.  Based on this information, patient would prefer to start PCSK9 inhibitor.  She has managed medicaid, so will send PA to determine coverage.  Once approved, she will need to do one injection every 14 days and repeat cholesterol labs after 4-6 doses.    Suggested she try putting fish oil in the refrigerator and taking the medication while cold to decrease fishy aftertaste.  If that doesn't work I asked that she please reach out to the office as we can try fenofibrate to get her LDL down without worrying about the fishy taste.

## 2022-06-22 NOTE — Patient Instructions (Signed)
Your Results:             Your most recent labs Goal  Total Cholesterol 185 < 200  Triglycerides 205 < 150  HDL (happy/good cholesterol) 33 > 40  LDL (lousy/bad cholesterol 111 < 55   Medication changes:  We will start the process to get Repatha covered by your insurance.  Once the prior authorization is complete, we will reach out to let you know and confirm pharmacy information.   You will take 1 injection every 14 days  Lab orders:  We want to repeat labs after 2-3 months.  We will send you a lab order to remind you once we get closer to that time.      Thank you for choosing [facility]

## 2022-07-07 ENCOUNTER — Encounter: Payer: Self-pay | Admitting: Cardiovascular Disease

## 2022-07-07 ENCOUNTER — Ambulatory Visit: Payer: Medicaid Other | Attending: Cardiovascular Disease | Admitting: Cardiovascular Disease

## 2022-07-07 DIAGNOSIS — I739 Peripheral vascular disease, unspecified: Secondary | ICD-10-CM | POA: Insufficient documentation

## 2022-07-07 NOTE — H&P (View-Only) (Signed)
07/07/2022 Alice Bennett   [birth year]  [id number]  Primary Physician Pcp, No Primary Cardiologist: Runell Gess MD Roseanne Reno  HPI:  Alice Bennett is a 61 y.o. moderately overweight divorced Caucasian female mother of 4, grandmother of 8 grandchildren who is currently disabled and was referred by Juanda Crumble, PA-C for evaluation of symptomatic PAD.  Her cardiologist is Dr. Daphene Jaeger.  She has had proximal and mid LAD intervention in 2014.  She had Takotsubo syndrome in 2018.  Cath showed nonobstructive CAD.  She presented on 7/23 with chest pain after the death of her brother the night before.  Cath showed stenosis between the 2 previously placed LAD stents which was hemodynamically significant by DFR and she was restented.  She has been asymptomatic since.  Her other problems include treated hypertension, diabetes and hyperlipidemia.  Both her parents had CAD.  She is complained of right hip and lower extremity claudication for a year which is lifestyle limiting with recent Dopplers performed 06/16/2022 revealing a right ABI of 0.  7 5 and a right ABI of 1.11 with a high-frequency signal in the right common iliac artery.   Current Meds  Medication Sig   aspirin EC 81 MG tablet Take 81 mg by mouth daily.   atorvastatin (LIPITOR) 80 MG tablet Take 1 tablet (80 mg total) by mouth daily at 6 PM.   carvedilol (COREG) 6.25 MG tablet Take 1 tablet (6.25 mg total) by mouth 2 (two) times daily with a meal.   Cholecalciferol (D3 HIGH POTENCY) 125 MCG (5000 UT) capsule Take 5,000 Units by mouth daily.   clonazePAM (KLONOPIN) 1 MG tablet Take 1 mg by mouth 2 (two) times daily.   clopidogrel (PLAVIX) 75 MG tablet Take 1 tablet (75 mg total) by mouth daily.   ezetimibe (ZETIA) 10 MG tablet Take 1 tablet (10 mg total) by mouth daily.   FARXIGA 10 MG TABS tablet Take 10 mg by mouth daily.    Multiple Vitamins-Minerals (HAIR SKIN AND NAILS FORMULA PO) Take 2 capsules by mouth  daily.   nitroGLYCERIN (NITROSTAT) 0.4 MG SL tablet Place 0.4 mg under the tongue as needed for chest pain.   olmesartan (BENICAR) 20 MG tablet Take 1 tablet (20 mg total) by mouth daily.   omega-3 acid ethyl esters (LOVAZA) 1 g capsule Take 2 capsules (2 g total) by mouth 2 (two) times daily.   OZEMPIC, 2 MG/DOSE, 8 MG/3ML SOPN Inject 2 mg into the skin once a week.   pantoprazole (PROTONIX) 40 MG tablet Take 1 tablet (40 mg total) by mouth daily.   pregabalin (LYRICA) 150 MG capsule Take 150 mg by mouth 2 (two) times daily.   sertraline (ZOLOFT) 100 MG tablet Take 100 mg by mouth daily.   spironolactone (ALDACTONE) 25 MG tablet Take 1 tablet (25 mg total) by mouth daily.     Allergies  Allergen Reactions   Codeine Itching    Social History   Socioeconomic History   Marital status: Single    Spouse name: Not on file   Number of children: Not on file   Years of education: Not on file   Highest education level: Not on file  Occupational History   Not on file  Tobacco Use   Smoking status: Every Day    Packs/day: 0.50    Years: 10.00    Total pack years: 5.00    Types: Cigarettes   Smokeless tobacco: Never  Vaping Use  Vaping Use: Never used  Substance and Sexual Activity   Alcohol use: No   Drug use: No   Sexual activity: Not on file  Other Topics Concern   Not on file  Social History Narrative   Not on file   Social Determinants of Health   Financial Resource Strain: Not on file  Food Insecurity: Not on file  Transportation Needs: Not on file  Physical Activity: Not on file  Stress: Not on file  Social Connections: Not on file  Intimate Partner Violence: Not on file     Review of Systems: General: negative for chills, fever, night sweats or weight changes.  Cardiovascular: negative for chest pain, dyspnea on exertion, edema, orthopnea, palpitations, paroxysmal nocturnal dyspnea or shortness of breath Dermatological: negative for rash Respiratory: negative  for cough or wheezing Urologic: negative for hematuria Abdominal: negative for nausea, vomiting, diarrhea, bright red blood per rectum, melena, or hematemesis Neurologic: negative for visual changes, syncope, or dizziness All other systems reviewed and are otherwise negative except as noted above.    Blood pressure 118/76, pulse 63, height 5\' 9"  (1.753 m), weight 224 lb (101.6 kg).  General appearance: alert and no distress Neck: no adenopathy, no carotid bruit, no JVD, supple, symmetrical, trachea midline, and thyroid not enlarged, symmetric, no tenderness/mass/nodules Lungs: clear to auscultation bilaterally Heart: regular rate and rhythm, S1, S2 normal, no murmur, click, rub or gallop Extremities: extremities normal, atraumatic, no cyanosis or edema Pulses: 2+ and symmetric Skin: Skin color, texture, turgor normal. No rashes or lesions Neurologic: Grossly normal  EKG not performed today  ASSESSMENT AND PLAN:   Peripheral arterial disease (HCC) Alice Bennett was referred to me by Harriette Ohara, PA-C for symptomatic PAD.  She has a history of CAD and multiple cardiac risk factors.  Her last year she is complain of lifestyle-limiting claudication involving her right hip and lower extremity.  She had Doppler studies performed in our office 06/16/2022 revealing a right ABI of 0.75 and a left of 1.11.  She had a high-frequency signal in her mid right common iliac artery and she wishes to proceed with peripheral angiography and endovascular therapy.     08/16/2022 MD FACP,FACC,FAHA, [facility] 07/07/2022 3:06 PM

## 2022-07-07 NOTE — Assessment & Plan Note (Signed)
Ms. Fantroy was referred to me by Juanda Crumble, PA-C for symptomatic PAD.  She has a history of CAD and multiple cardiac risk factors.  Her last year she is complain of lifestyle-limiting claudication involving her right hip and lower extremity.  She had Doppler studies performed in our office 06/16/2022 revealing a right ABI of 0.75 and a left of 1.11.  She had a high-frequency signal in her mid right common iliac artery and she wishes to proceed with peripheral angiography and endovascular therapy.

## 2022-07-07 NOTE — Progress Notes (Signed)
   07/07/2022 Alice Bennett   04/05/1961  [id number]  Primary Physician Pcp, No Primary Cardiologist: Alice Moon J Alice Palmero MD FACP, FACC, FAHA, FSCAI  HPI:  Alice Bennett is a 60 y.o. moderately overweight divorced Caucasian female mother of 4, grandmother of 8 grandchildren who is currently disabled and was referred by Alice Lambert, PA-C for evaluation of symptomatic PAD.  Her cardiologist is Dr. Tom Bennett.  She has had proximal and mid LAD intervention in 2014.  She had Takotsubo syndrome in 2018.  Cath showed nonobstructive CAD.  She presented on 7/23 with chest pain after the death of her brother the night before.  Cath showed stenosis between the 2 previously placed LAD stents which was hemodynamically significant by DFR and she was restented.  She has been asymptomatic since.  Her other problems include treated hypertension, diabetes and hyperlipidemia.  Both her parents had CAD.  She is complained of right hip and lower extremity claudication for a year which is lifestyle limiting with recent Dopplers performed 06/16/2022 revealing a right ABI of 0.  7 5 and a right ABI of 1.11 with a high-frequency signal in the right common iliac artery.   Current Meds  Medication Sig   aspirin EC 81 MG tablet Take 81 mg by mouth daily.   atorvastatin (LIPITOR) 80 MG tablet Take 1 tablet (80 mg total) by mouth daily at 6 PM.   carvedilol (COREG) 6.25 MG tablet Take 1 tablet (6.25 mg total) by mouth 2 (two) times daily with a meal.   Cholecalciferol (D3 HIGH POTENCY) 125 MCG (5000 UT) capsule Take 5,000 Units by mouth daily.   clonazePAM (KLONOPIN) 1 MG tablet Take 1 mg by mouth 2 (two) times daily.   clopidogrel (PLAVIX) 75 MG tablet Take 1 tablet (75 mg total) by mouth daily.   ezetimibe (ZETIA) 10 MG tablet Take 1 tablet (10 mg total) by mouth daily.   FARXIGA 10 MG TABS tablet Take 10 mg by mouth daily.    Multiple Vitamins-Minerals (HAIR SKIN AND NAILS FORMULA PO) Take 2 capsules by mouth  daily.   nitroGLYCERIN (NITROSTAT) 0.4 MG SL tablet Place 0.4 mg under the tongue as needed for chest pain.   olmesartan (BENICAR) 20 MG tablet Take 1 tablet (20 mg total) by mouth daily.   omega-3 acid ethyl esters (LOVAZA) 1 g capsule Take 2 capsules (2 g total) by mouth 2 (two) times daily.   OZEMPIC, 2 MG/DOSE, 8 MG/3ML SOPN Inject 2 mg into the skin once a week.   pantoprazole (PROTONIX) 40 MG tablet Take 1 tablet (40 mg total) by mouth daily.   pregabalin (LYRICA) 150 MG capsule Take 150 mg by mouth 2 (two) times daily.   sertraline (ZOLOFT) 100 MG tablet Take 100 mg by mouth daily.   spironolactone (ALDACTONE) 25 MG tablet Take 1 tablet (25 mg total) by mouth daily.     Allergies  Allergen Reactions   Codeine Itching    Social History   Socioeconomic History   Marital status: Single    Spouse name: Not on file   Number of children: Not on file   Years of education: Not on file   Highest education level: Not on file  Occupational History   Not on file  Tobacco Use   Smoking status: Every Day    Packs/day: 0.50    Years: 10.00    Total pack years: 5.00    Types: Cigarettes   Smokeless tobacco: Never  Vaping Use     Vaping Use: Never used  Substance and Sexual Activity   Alcohol use: No   Drug use: No   Sexual activity: Not on file  Other Topics Concern   Not on file  Social History Narrative   Not on file   Social Determinants of Health   Financial Resource Strain: Not on file  Food Insecurity: Not on file  Transportation Needs: Not on file  Physical Activity: Not on file  Stress: Not on file  Social Connections: Not on file  Intimate Partner Violence: Not on file     Review of Systems: General: negative for chills, fever, night sweats or weight changes.  Cardiovascular: negative for chest pain, dyspnea on exertion, edema, orthopnea, palpitations, paroxysmal nocturnal dyspnea or shortness of breath Dermatological: negative for rash Respiratory: negative  for cough or wheezing Urologic: negative for hematuria Abdominal: negative for nausea, vomiting, diarrhea, bright red blood per rectum, melena, or hematemesis Neurologic: negative for visual changes, syncope, or dizziness All other systems reviewed and are otherwise negative except as noted above.    Blood pressure 118/76, pulse 63, height 5\' 9"  (1.753 m), weight 224 lb (101.6 kg).  General appearance: alert and no distress Neck: no adenopathy, no carotid bruit, no JVD, supple, symmetrical, trachea midline, and thyroid not enlarged, symmetric, no tenderness/mass/nodules Lungs: clear to auscultation bilaterally Heart: regular rate and rhythm, S1, S2 normal, no murmur, click, rub or gallop Extremities: extremities normal, atraumatic, no cyanosis or edema Pulses: 2+ and symmetric Skin: Skin color, texture, turgor normal. No rashes or lesions Neurologic: Grossly normal  EKG not performed today  ASSESSMENT AND PLAN:   Peripheral arterial disease (HCC) Alice Bennett was referred to me by Alice Ohara, PA-C for symptomatic PAD.  She has a history of CAD and multiple cardiac risk factors.  Her last year she is complain of lifestyle-limiting claudication involving her right hip and lower extremity.  She had Doppler studies performed in our office 06/16/2022 revealing a right ABI of 0.75 and a left of 1.11.  She had a high-frequency signal in her mid right common iliac artery and she wishes to proceed with peripheral angiography and endovascular therapy.     08/16/2022 MD FACP,FACC,FAHA, [facility] 07/07/2022 3:06 PM

## 2022-07-07 NOTE — Patient Instructions (Signed)
Medication Instructions:  Your physician recommends that you continue on your current medications as directed. Please refer to the Current Medication list given to you today.  *If you need a refill on your cardiac medications before your next appointment, please call your pharmacy*   Lab Work: Your physician recommends that you have labs drawn today: BMET & CBC  If you have labs (blood work) drawn today and your tests are completely normal, you will receive your results only by: MyChart Message (if you have MyChart) OR A paper copy in the mail If you have any lab test that is abnormal or we need to change your treatment, we will call you to review the results.   Testing/Procedures: Your physician has requested that you have a lower extremity arterial duplex. This test is an ultrasound of the arteries in the legs. It looks at arterial blood flow in the legs. Allow one hour for Lower Arterial scans. There are no restrictions or special instructions To be done 1-2 weeks after your procedure (9/18). This will be done at [address] [facility]  Your physician has requested that you have an ankle brachial index (ABI). During this test an ultrasound and blood pressure cuff are used to evaluate the arteries that supply the arms and legs with blood. Allow thirty minutes for this exam. There are no restrictions or special instructions. To be done 1-2 weeks after your procedure (9/18). This will be done at [address] [facility]   Follow-Up: At [facility], you and your health needs are our priority.  As part of our continuing mission to provide you with exceptional heart care, we have created designated Provider Care Teams.  These Care Teams include your primary Cardiologist (physician) and Advanced Practice Providers (APPs -  Physician Assistants and Nurse Practitioners) who all work together to provide you with the care you need, when you need it.  We recommend signing up for the  patient portal called "MyChart".  Sign up information is provided on this After Visit Summary.  MyChart is used to connect with patients for Virtual Visits (Telemedicine).  Patients are able to view lab/test results, encounter notes, upcoming appointments, etc.  Non-urgent messages can be sent to your provider as well.   To learn more about what you can do with MyChart, go to [email or web address].    Your next appointment:   2-3 week(s) weeks after your procedure (9/18)  The format for your next appointment:   In Person  Provider:   Nanetta Batty, MD   Other Instructions  [facility] A DEPT OF [place]. [facility] A DEPT OF [place]. [facility] [address] [id number] [facility] [place] [address] Dept: [phone] Loc: [phone]  Alice Bennett  07/07/2022  You are scheduled for a Peripheral Angiogram on Monday, September 18 with Dr. Nanetta Batty.  1. Please arrive at the [facility] (Main Entrance A) at [facility]: [address] [place], [place] [address] at 12:00 PM (This time is two hours before your procedure to ensure your preparation). Free valet parking service is available.   Special note: Every effort is made to have your procedure done on time. Please understand that emergencies sometimes delay scheduled procedures.  2. Diet: Do not eat solid foods after midnight.  The patient may have clear liquids until 5am upon the day of the procedure.  3. Labs: You will need to have blood drawn  today.  4. Medication instructions in preparation for your procedure:   Stop taking, Spironolactone (aldactone) Monday, September 18,   On the morning of your procedure, take your Aspirin and Plavix/Clopidogrel and any morning medicines NOT listed above.  You may use sips of water.  5. Plan for one night stay--bring personal belongings. 6. Bring a current list of your medications and current  insurance cards. 7. You MUST have a responsible person to drive you home. 8. Someone MUST be with you the first 24 hours after you arrive home or your discharge will be delayed. 9. Please wear clothes that are easy to get on and off and wear slip-on shoes.  Thank you for allowing [place] to care for you!   -- [facility]

## 2022-07-08 LAB — CBC
Hematocrit: 41.8 % (ref 34.0–46.6)
Hemoglobin: 13.8 g/dL (ref 11.1–15.9)
MCH: 28.5 pg (ref 26.6–33.0)
MCHC: 33 g/dL (ref 31.5–35.7)
MCV: 86 fL (ref 79–97)
Platelets: 192 10*3/uL (ref 150–450)
RBC: 4.85 x10E6/uL (ref 3.77–5.28)
RDW: 14.2 % (ref 11.7–15.4)
WBC: 7.8 10*3/uL (ref 3.4–10.8)

## 2022-07-08 LAB — BASIC METABOLIC PANEL
BUN/Creatinine Ratio: 10 — ABNORMAL LOW (ref 12–28)
BUN: 8 mg/dL (ref 8–27)
CO2: 26 mmol/L (ref 20–29)
Calcium: 9.6 mg/dL (ref 8.7–10.3)
Chloride: 102 mmol/L (ref 96–106)
Creatinine, Ser: 0.81 mg/dL (ref 0.57–1.00)
Glucose: 101 mg/dL — ABNORMAL HIGH (ref 70–99)
Potassium: 4.6 mmol/L (ref 3.5–5.2)
Sodium: 142 mmol/L (ref 134–144)
eGFR: 83 mL/min/{1.73_m2} (ref >59)

## 2022-07-09 ENCOUNTER — Other Ambulatory Visit: Payer: Self-pay

## 2022-07-09 DIAGNOSIS — I739 Peripheral vascular disease, unspecified: Secondary | ICD-10-CM

## 2022-07-09 MED ORDER — SODIUM CHLORIDE 0.9% FLUSH: 3.0000 mL | Freq: Two times a day (BID) | INTRAVENOUS | Status: DC

## 2022-07-22 ENCOUNTER — Telehealth: Payer: Self-pay | Admitting: *Deleted

## 2022-07-22 NOTE — Telephone Encounter (Addendum)
Abdominal aortogram scheduled at [facility] for: Monday July 26, 2022 2 PM Arrival time and place: [facility] Main Entrance A at: 59 Noon  Nothing to eat after midnight prior to procedure, clear liquids until 5 AM day of procedure.  Medication instructions: -Hold:  Spironolactone-AM of procedure  Farxiga-AM of procedure   Ozempic -weekly on Tuesdays -Except hold medications usual morning medications can be taken with sips of water including aspirin 81 mg and Plavix 75 mg  Confirmed patient has responsible adult to drive home post procedure and be with patient first 24 hours after arriving home.  Patient reports no new symptoms concerning for COVID-19 in the past 10 days.  Reviewed procedure instructions with patient.

## 2022-07-26 ENCOUNTER — Other Ambulatory Visit: Payer: Self-pay

## 2022-07-26 ENCOUNTER — Encounter (HOSPITAL_COMMUNITY)
Admission: RE | Disposition: A | Discharge status: Home / Self Care | Payer: Self-pay | Source: Home / Self Care | Attending: Cardiovascular Disease

## 2022-07-26 ENCOUNTER — Ambulatory Visit (HOSPITAL_COMMUNITY)
Admission: RE | Admit: 2022-07-26 | Discharge: 2022-07-27 | Disposition: A | Discharge status: Home / Self Care | Payer: Medicaid Other | Attending: Cardiovascular Disease | Admitting: Cardiovascular Disease

## 2022-07-26 DIAGNOSIS — I739 Peripheral vascular disease, unspecified: Secondary | ICD-10-CM | POA: Diagnosis present

## 2022-07-26 DIAGNOSIS — Z955 Presence of coronary angioplasty implant and graft: Secondary | ICD-10-CM | POA: Diagnosis not present

## 2022-07-26 DIAGNOSIS — F17210 Nicotine dependence, cigarettes, uncomplicated: Secondary | ICD-10-CM | POA: Diagnosis not present

## 2022-07-26 DIAGNOSIS — I708 Atherosclerosis of other arteries: Secondary | ICD-10-CM | POA: Insufficient documentation

## 2022-07-26 DIAGNOSIS — E1151 Type 2 diabetes mellitus with diabetic peripheral angiopathy without gangrene: Secondary | ICD-10-CM | POA: Insufficient documentation

## 2022-07-26 DIAGNOSIS — Z7984 Long term (current) use of oral hypoglycemic drugs: Secondary | ICD-10-CM | POA: Insufficient documentation

## 2022-07-26 DIAGNOSIS — E785 Hyperlipidemia, unspecified: Secondary | ICD-10-CM | POA: Insufficient documentation

## 2022-07-26 DIAGNOSIS — Z8249 Family history of ischemic heart disease and other diseases of the circulatory system: Secondary | ICD-10-CM | POA: Diagnosis not present

## 2022-07-26 DIAGNOSIS — I2510 Atherosclerotic heart disease of native coronary artery without angina pectoris: Secondary | ICD-10-CM | POA: Diagnosis not present

## 2022-07-26 DIAGNOSIS — Z7985 Long-term (current) use of injectable non-insulin antidiabetic drugs: Secondary | ICD-10-CM | POA: Diagnosis not present

## 2022-07-26 DIAGNOSIS — I10 Essential (primary) hypertension: Secondary | ICD-10-CM | POA: Insufficient documentation

## 2022-07-26 DIAGNOSIS — I70211 Atherosclerosis of native arteries of extremities with intermittent claudication, right leg: Secondary | ICD-10-CM

## 2022-07-26 HISTORY — PX: PERIPHERAL VASCULAR INTERVENTION: CATH118257

## 2022-07-26 HISTORY — PX: ABDOMINAL AORTOGRAM W/LOWER EXTREMITY: CATH118223

## 2022-07-26 LAB — POCT ACTIVATED CLOTTING TIME
Activated Clotting Time: 287 seconds
Activated Clotting Time: 311 seconds
Activated Clotting Time: 167 seconds

## 2022-07-26 LAB — GLUCOSE, CAPILLARY: Glucose-Capillary: 106 mg/dL — ABNORMAL HIGH (ref 70–99)

## 2022-07-26 SURGERY — ABDOMINAL AORTOGRAM W/LOWER EXTREMITY
Anesthesia: LOCAL

## 2022-07-26 MED ORDER — EZETIMIBE 10 MG PO TABS
10.0000 mg | ORAL_TABLET | Freq: Every day | ORAL | Status: DC
  Administered 2022-07-26 – 2022-07-27 (×2): 10 mg via ORAL
  Filled 2022-07-26 (×2): qty 1

## 2022-07-26 MED ORDER — HEPARIN (PORCINE) IN NACL 1000-0.9 UT/500ML-% IV SOLN
INTRAVENOUS | Status: AC
  Filled 2022-07-26: qty 1000

## 2022-07-26 MED ORDER — LIDOCAINE HCL (PF) 1 % IJ SOLN
INTRAMUSCULAR | Status: AC
  Filled 2022-07-26: qty 30

## 2022-07-26 MED ORDER — SODIUM CHLORIDE 0.9 % WEIGHT BASED INFUSION: 1.0000 mL/kg/h | INTRAVENOUS | Status: DC

## 2022-07-26 MED ORDER — MIDAZOLAM HCL 2 MG/2ML IJ SOLN
INTRAMUSCULAR | Status: DC | PRN
  Administered 2022-07-26: 1 mg via INTRAVENOUS

## 2022-07-26 MED ORDER — ATORVASTATIN CALCIUM 80 MG PO TABS
80.0000 mg | ORAL_TABLET | Freq: Every day | ORAL | Status: DC
  Administered 2022-07-26: 80 mg via ORAL
  Filled 2022-07-26: qty 1

## 2022-07-26 MED ORDER — ACETAMINOPHEN 325 MG PO TABS: 650.0000 mg | ORAL_TABLET | ORAL | Status: DC | PRN

## 2022-07-26 MED ORDER — SODIUM CHLORIDE 0.9 % IV SOLN: 250.0000 mL | INTRAVENOUS | Status: DC | PRN

## 2022-07-26 MED ORDER — PANTOPRAZOLE SODIUM 40 MG PO TBEC
40.0000 mg | DELAYED_RELEASE_TABLET | Freq: Every day | ORAL | Status: DC
  Administered 2022-07-26 – 2022-07-27 (×2): 40 mg via ORAL
  Filled 2022-07-26 (×2): qty 1

## 2022-07-26 MED ORDER — HEPARIN SODIUM (PORCINE) 1000 UNIT/ML IJ SOLN
INTRAMUSCULAR | Status: DC | PRN
  Administered 2022-07-26: 10000 [IU] via INTRAVENOUS

## 2022-07-26 MED ORDER — MIDAZOLAM HCL 2 MG/2ML IJ SOLN
INTRAMUSCULAR | Status: AC
  Filled 2022-07-26: qty 2

## 2022-07-26 MED ORDER — SODIUM CHLORIDE 0.9 % WEIGHT BASED INFUSION: 3.0000 mL/kg/h | INTRAVENOUS | Status: DC

## 2022-07-26 MED ORDER — DAPAGLIFLOZIN PROPANEDIOL 10 MG PO TABS
10.0000 mg | ORAL_TABLET | Freq: Every day | ORAL | Status: DC
  Administered 2022-07-26 – 2022-07-27 (×2): 10 mg via ORAL
  Filled 2022-07-26 (×2): qty 1

## 2022-07-26 MED ORDER — SERTRALINE HCL 100 MG PO TABS
100.0000 mg | ORAL_TABLET | Freq: Every day | ORAL | Status: DC
  Administered 2022-07-26 – 2022-07-27 (×2): 100 mg via ORAL
  Filled 2022-07-26 (×2): qty 1

## 2022-07-26 MED ORDER — CLONAZEPAM 0.5 MG PO TABS
1.0000 mg | ORAL_TABLET | Freq: Two times a day (BID) | ORAL | Status: DC
  Administered 2022-07-26: 1 mg via ORAL
  Filled 2022-07-26 (×2): qty 2

## 2022-07-26 MED ORDER — ONDANSETRON HCL 4 MG/2ML IJ SOLN: 4.0000 mg | Freq: Four times a day (QID) | INTRAMUSCULAR | Status: DC | PRN

## 2022-07-26 MED ORDER — CLOPIDOGREL BISULFATE 75 MG PO TABS
75.0000 mg | ORAL_TABLET | Freq: Every day | ORAL | Status: DC
  Administered 2022-07-27: 75 mg via ORAL
  Filled 2022-07-26: qty 1

## 2022-07-26 MED ORDER — SODIUM CHLORIDE 0.9% FLUSH: 3.0000 mL | INTRAVENOUS | Status: DC | PRN

## 2022-07-26 MED ORDER — ASPIRIN 81 MG PO TBEC: 81.0000 mg | DELAYED_RELEASE_TABLET | Freq: Every day | ORAL | Status: DC

## 2022-07-26 MED ORDER — CARVEDILOL 6.25 MG PO TABS
6.2500 mg | ORAL_TABLET | Freq: Two times a day (BID) | ORAL | Status: DC
  Administered 2022-07-26 – 2022-07-27 (×2): 6.25 mg via ORAL
  Filled 2022-07-26 (×2): qty 1

## 2022-07-26 MED ORDER — PREGABALIN 75 MG PO CAPS
150.0000 mg | ORAL_CAPSULE | Freq: Two times a day (BID) | ORAL | Status: DC
  Administered 2022-07-26 – 2022-07-27 (×2): 150 mg via ORAL
  Filled 2022-07-26 (×2): qty 2

## 2022-07-26 MED ORDER — CLOPIDOGREL BISULFATE 75 MG PO TABS: 75.0000 mg | ORAL_TABLET | Freq: Every day | ORAL | Status: DC

## 2022-07-26 MED ORDER — HYDRALAZINE HCL 20 MG/ML IJ SOLN: 5.0000 mg | INTRAMUSCULAR | Status: DC | PRN

## 2022-07-26 MED ORDER — FENTANYL CITRATE (PF) 100 MCG/2ML IJ SOLN
INTRAMUSCULAR | Status: AC
  Filled 2022-07-26: qty 2

## 2022-07-26 MED ORDER — LABETALOL HCL 5 MG/ML IV SOLN: 10.0000 mg | INTRAVENOUS | Status: DC | PRN

## 2022-07-26 MED ORDER — IRBESARTAN 150 MG PO TABS
150.0000 mg | ORAL_TABLET | Freq: Every day | ORAL | Status: DC
  Administered 2022-07-26 – 2022-07-27 (×2): 150 mg via ORAL
  Filled 2022-07-26 (×2): qty 1

## 2022-07-26 MED ORDER — SPIRONOLACTONE 25 MG PO TABS
25.0000 mg | ORAL_TABLET | Freq: Every day | ORAL | Status: DC
  Administered 2022-07-26 – 2022-07-27 (×2): 25 mg via ORAL
  Filled 2022-07-26 (×2): qty 1

## 2022-07-26 MED ORDER — SODIUM CHLORIDE 0.9% FLUSH
3.0000 mL | Freq: Two times a day (BID) | INTRAVENOUS | Status: DC
  Administered 2022-07-26 – 2022-07-27 (×2): 3 mL via INTRAVENOUS

## 2022-07-26 MED ORDER — FENTANYL CITRATE (PF) 100 MCG/2ML IJ SOLN
INTRAMUSCULAR | Status: DC | PRN
  Administered 2022-07-26: 25 ug via INTRAVENOUS

## 2022-07-26 MED ORDER — NITROGLYCERIN 1 MG/10 ML FOR IR/CATH LAB
INTRA_ARTERIAL | Status: DC | PRN
  Administered 2022-07-26: 200 ug via INTRA_ARTERIAL

## 2022-07-26 MED ORDER — HEPARIN (PORCINE) IN NACL 1000-0.9 UT/500ML-% IV SOLN
INTRAVENOUS | Status: DC | PRN
  Administered 2022-07-26 (×2): 500 mL

## 2022-07-26 MED ORDER — NITROGLYCERIN 1 MG/10 ML FOR IR/CATH LAB
INTRA_ARTERIAL | Status: AC
  Filled 2022-07-26: qty 10

## 2022-07-26 MED ORDER — IODIXANOL 320 MG/ML IV SOLN
INTRAVENOUS | Status: DC | PRN
  Administered 2022-07-26: 180 mL via INTRA_ARTERIAL

## 2022-07-26 MED ORDER — HEPARIN SODIUM (PORCINE) 1000 UNIT/ML IJ SOLN
INTRAMUSCULAR | Status: AC
  Filled 2022-07-26: qty 10

## 2022-07-26 MED ORDER — SODIUM CHLORIDE 0.9 % IV SOLN: INTRAVENOUS | Status: AC

## 2022-07-26 MED ORDER — ASPIRIN 81 MG PO CHEW: 81.0000 mg | CHEWABLE_TABLET | ORAL | Status: DC

## 2022-07-26 MED ORDER — ASPIRIN 81 MG PO TBEC
81.0000 mg | DELAYED_RELEASE_TABLET | Freq: Every day | ORAL | Status: DC
  Administered 2022-07-27: 81 mg via ORAL
  Filled 2022-07-26: qty 1

## 2022-07-26 MED ORDER — LIDOCAINE HCL (PF) 1 % IJ SOLN
INTRAMUSCULAR | Status: DC | PRN
  Administered 2022-07-26: 28 mL

## 2022-07-26 SURGICAL SUPPLY — 15 items
CATH ANGIO 5F PIGTAIL 65CM (CATHETERS) IMPLANT
CATH STRAIGHT 5FR 65CM (CATHETERS) IMPLANT
KIT ENCORE 26 ADVANTAGE (KITS) IMPLANT
KIT PV (KITS) ×2 IMPLANT
SHEATH BRITE TIP 7FR 35CM (SHEATH) IMPLANT
SHEATH PINNACLE 5F 10CM (SHEATH) IMPLANT
SHEATH PINNACLE 7F 10CM (SHEATH) IMPLANT
SHEATH PROBE COVER 6X72 (BAG) IMPLANT
STENT VIABAHN 7X29X80 VBX (Permanent Stent) IMPLANT
SYR MEDRAD MARK 7 150ML (SYRINGE) ×2 IMPLANT
TAPE SHOOT N SEE (TAPE) IMPLANT
TRANSDUCER W/STOPCOCK (MISCELLANEOUS) ×2 IMPLANT
TRAY PV CATH (CUSTOM PROCEDURE TRAY) ×2 IMPLANT
TUBING CIL FLEX 10 FLL-RA (TUBING) IMPLANT
WIRE HITORQ VERSACORE ST 145CM (WIRE) IMPLANT

## 2022-07-26 NOTE — Progress Notes (Signed)
45fr sheath aspirated and removed from right femoral artery. Manual pressure applied for 20 minutes. Site level 0 no s+s of hematoma. Tegaderm dressing applied, bedrest instructions given.   Bilateral dp pulses palpable.    Bedrest begins at 19:20:00

## 2022-07-26 NOTE — Interval H&P Note (Signed)
History and Physical Interval Note:  07/26/2022 3:00 PM  Alice Bennett  has presented today for surgery, with the diagnosis of PAD.  The various methods of treatment have been discussed with the patient and family. After consideration of risks, benefits and other options for treatment, the patient has consented to  Procedure(s): ABDOMINAL AORTOGRAM W/LOWER EXTREMITY (N/A) as a surgical intervention.  The patient's history has been reviewed, patient examined, no change in status, stable for surgery.  I have reviewed the patient's chart and labs.  Questions were answered to the patient's satisfaction.     Quay Burow

## 2022-07-27 ENCOUNTER — Encounter (HOSPITAL_COMMUNITY): Payer: Self-pay | Admitting: Cardiovascular Disease

## 2022-07-27 DIAGNOSIS — I739 Peripheral vascular disease, unspecified: Secondary | ICD-10-CM | POA: Diagnosis not present

## 2022-07-27 DIAGNOSIS — E1151 Type 2 diabetes mellitus with diabetic peripheral angiopathy without gangrene: Secondary | ICD-10-CM | POA: Diagnosis not present

## 2022-07-27 LAB — LIPID PANEL
Cholesterol: 161 mg/dL (ref 0–200)
HDL: 25 mg/dL — ABNORMAL LOW (ref >40)
LDL Cholesterol: 91 mg/dL (ref 0–99)
Total CHOL/HDL Ratio: 6.4 RATIO
Triglycerides: 226 mg/dL — ABNORMAL HIGH (ref <150)
VLDL: 45 mg/dL — ABNORMAL HIGH (ref 0–40)

## 2022-07-27 LAB — BASIC METABOLIC PANEL
Anion gap: 9 (ref 5–15)
BUN: 15 mg/dL (ref 6–20)
CO2: 23 mmol/L (ref 22–32)
Calcium: 8.6 mg/dL — ABNORMAL LOW (ref 8.9–10.3)
Chloride: 109 mmol/L (ref 98–111)
Creatinine, Ser: 1.08 mg/dL — ABNORMAL HIGH (ref 0.44–1.00)
GFR, Estimated: 59 mL/min — ABNORMAL LOW (ref >60)
Glucose, Bld: 142 mg/dL — ABNORMAL HIGH (ref 70–99)
Potassium: 3.3 mmol/L — ABNORMAL LOW (ref 3.5–5.1)
Sodium: 141 mmol/L (ref 135–145)

## 2022-07-27 LAB — CBC
HCT: 39.2 % (ref 36.0–46.0)
Hemoglobin: 12.9 g/dL (ref 12.0–15.0)
MCH: 28.9 pg (ref 26.0–34.0)
MCHC: 32.9 g/dL (ref 30.0–36.0)
MCV: 87.9 fL (ref 80.0–100.0)
Platelets: 162 10*3/uL (ref 150–400)
RBC: 4.46 MIL/uL (ref 3.87–5.11)
RDW: 14.4 % (ref 11.5–15.5)
WBC: 6.4 10*3/uL (ref 4.0–10.5)
nRBC: 0 % (ref 0.0–0.2)

## 2022-07-27 MED ORDER — POTASSIUM CHLORIDE CRYS ER 20 MEQ PO TBCR
40.0000 meq | EXTENDED_RELEASE_TABLET | Freq: Once | ORAL | Status: AC
  Administered 2022-07-27: 40 meq via ORAL
  Filled 2022-07-27: qty 2

## 2022-07-27 NOTE — Plan of Care (Signed)

## 2022-07-27 NOTE — Discharge Summary (Addendum)
Patient  Discharge Summary    Patient ID: Alice Bennett MRN: [id number]; DOB: 1961/05/09  Admit date: 07/26/2022 Discharge date: 07/27/2022  PCP:  Merryl Hacker No   [facility] Prov[facility] Cardiologist:  Shelva Majestic, MD     Discharge Diagnoses    Principal Problem:   Claudication in peripheral vascular disease ([place])  Diagnostic Studies/Procedures    PV angiogram: 07/26/22  Final Impression: Successful right common iliac artery PTA and covered stenting using a 7 mm x 29 mm long VBX covered stent in the setting of lifestyle-limiting claudication.  The sheath will be removed once ACT falls below 170 and pressure held.  She will be hydrated overnight and discharged home in the morning on DAPT.  We will get aortoiliac/lower extremity arterial Doppler studies in unrefined office next week and I will see her back in the office 1 to 2 weeks thereafter.  She left the lab in stable condition.    Quay Burow. MD, [facility] 07/26/2022 4:14 PM _____________   History of Present Illness     Alice Bennett is a 61 y.o. female with past medical history of CAD status post LAD intervention 2014, most recently PCI to LAD 7/23, Takotsubo cardiomyopathy, hypertension, diabetes, hyperlipidemia.  She has been followed by Dr. Claiborne Billings as an outpatient.  She was recently admitted 7/23 with a non-STEMI and underwent successful RFR guided PCI to the mid LAD x1.  During that admission she complained of right hip pain.  She had outpatient Dopplers 06/16/2022 which showed a right ABI of 0.75 and a left ABI of 1.11 with high-frequency signal in the right common iliac artery.  Given her findings she was seen by Dr. Gwenlyn Found in the office and recommendations given to undergo outpatient PV angiography.  Hospital Course     Underwent successful right common iliac artery PTA with stenting for lifestyle limiting claudication.  Recommendations to continue on DAPT with aspirin/Plavix.  Observed/hydrated overnight with no  complications.  She will be continued on her home medications.  Plan for outpatient lower extremity arterial Dopplers along with outpatient follow-up.  She was able to ambulate without complications. Of note she has been referred to the lipid clinic and pending initiation of PCSK9.  General: Well developed, well nourished, female appearing in no acute distress. Head: Normocephalic, atraumatic.  Neck: Supple without bruits, JVD. Lungs:  Resp regular and unlabored, CTA. Heart: RRR, S1, S2, no S3, S4, or murmur; no rub. Abdomen: Soft, non-tender, non-distended with normoactive bowel sounds. No hepatomegaly. No rebound/guarding. No obvious abdominal masses. Extremities: No clubbing, cyanosis, edema. Distal pedal pulses are 2+ bilaterally. Right femoral cath site stable without bruising or hematoma Neuro: Alert and oriented X 3. Moves all extremities spontaneously. Psych: Normal affect.   Did the patient have an acute coronary syndrome (MI, NSTEMI, STEMI, etc) this admission?:  No                               Did the patient have a percutaneous coronary intervention (stent / angioplasty)?:  No.     _____________  Discharge Vitals Blood pressure (!) 120/57, pulse 69, temperature 97.6 F (36.4 C), temperature source Oral, resp. rate 16, height '5\' 9"'  (1.753 m), weight 101.6 kg, SpO2 97 %.  Filed Weights   07/26/22 1214  Weight: 101.6 kg    Labs & Radiologic Studies    CBC Recent Labs    07/27/22 0553  WBC 6.4  HGB 12.9  HCT 39.2  MCV 87.9  PLT 193   Basic Metabolic Panel Recent Labs    07/27/22 0553  NA 141  K 3.3*  CL 109  CO2 23  GLUCOSE 142*  BUN 15  CREATININE 1.08*  CALCIUM 8.6*   Liver Function Tests No results for input(s): "AST", "ALT", "ALKPHOS", "BILITOT", "PROT", "ALBUMIN" in the last 72 hours. No results for input(s): "LIPASE", "AMYLASE" in the last 72 hours. High Sensitivity Troponin:   No results for input(s): "TROPONINIHS" in the last 720 hours.   BNP Invalid input(s): "POCBNP" D-Dimer No results for input(s): "DDIMER" in the last 72 hours. Hemoglobin A1C No results for input(s): "HGBA1C" in the last 72 hours. Fasting Lipid Panel Recent Labs    07/27/22 0553  CHOL 161  HDL 25*  LDLCALC 91  TRIG 226*  CHOLHDL 6.4   Thyroid Function Tests No results for input(s): "TSH", "T4TOTAL", "T3FREE", "THYROIDAB" in the last 72 hours.  Invalid input(s): "FREET3" _____________  PERIPHERAL VASCULAR CATHETERIZATION  Result Date: 07/26/2022 Images from the original result were not included.  [id number] LOCATION:  FACILITY: [facility] PHYSICIAN: Quay Burow, M.D. Mar 16, 1961 DATE OF PROCEDURE:  07/26/2022 DATE OF DISCHARGE: PV Angiogram/Intervention History obtained from chart review. Alice Bennett is a 61 y.o. moderately overweight divorced Caucasian female mother of 16, grandmother of 8 grandchildren who is currently disabled and was referred by Caron Presume, PA-C for evaluation of symptomatic PAD.  Her cardiologist is Dr. Ellouise Newer.  She has had proximal and mid LAD intervention in 2014.  She had Takotsubo syndrome in 2018.  Cath showed nonobstructive CAD.  She presented on 7/23 with chest pain after the death of her brother the night before.  Cath showed stenosis between the 2 previously placed LAD stents which was hemodynamically significant by DFR and she was restented.  She has been asymptomatic since.  Her other problems include treated hypertension, diabetes and hyperlipidemia.  Both her parents had CAD.  She is complained of right hip and lower extremity claudication for a year which is lifestyle limiting with recent Dopplers performed 06/16/2022 revealing a right ABI of 0.  7 5 and a right ABI of 1.11 with a high-frequency signal in the right common iliac artery.  She presents today for outpatient PV angiography and potential endovascular therapy for lifestyle-limiting claudication. Pre Procedure Diagnosis: Peripheral arterial disease Post  Procedure Diagnosis: Peripheral arterial disease Operators: Dr. Quay Burow Procedures Performed:  1.  Ultrasound-guided right common femoral access  2.  Abdominal aortogram/bilateral iliac angiogram/bifemoral runoff  3.  PTA And stenting right common iliac artery  PROCEDURE DESCRIPTION: The patient was brought to the second floor [facility] lab in the the postabsorptive state. She was premedicated with IV Versed and fentanyl. Her right groin was prepped and shaved in usual sterile fashion. Xylocaine 1% was used for local anesthesia. A 5 French sheath was inserted into the right common femoral artery using standard Seldinger technique.  Ultrasound was used to identify the right common femoral artery and guide access.  A digital image was captured and placed the patient's chart.  A 5 French Patel catheter was placed in distal abdominal aorta.  Distal abdominal aortography, bilateral iliac angiography with bifemoral runoff was performed using bolus chase, digital subtraction and step table technique.  Omnipaque dye was used for the entirety of the case (total contrast the patient 180 cc).  Retrograde ordered pressures monitored to the case.  Angiographic Data: 1: Abdominal aorta-renal arteries widely patent.  Infrarenal abdominal  aorta is free of significant atherosclerotic changes 2: Left lower extremity-two-vessel runoff with occluded anterior tibial artery 3: Right lower extremity-70% eccentric proximal right common iliac artery stenosis with an 80 mm pullback gradient after administration of 200 mcg of intra-arterial nitroglycerin.  There is a 40% mid right SFA stenosis and a 60% P2 segment popliteal artery stenosis   Alice Bennett has a physiologically significant right common iliac artery stenosis.  We will proceed with PTA and covered stenting using a VBX covered stent. Procedure Description: The 5 French sheath was exchanged over an 035 versa core wire for a 7 [place] Brite tip sheath.  The  patient received a total of 10,000's of heparin with an ACT of 311.  I primarily stented the iliac stenosis with a 7 mm x 29 mm long VBX covered stent deployed at 11 atm resulting in reduction of a 70% eccentric mid right common iliac artery stenosis to 0% residual.  The patient tolerated the procedure well.  The right hip sheath was then exchanged over the 035 versa core wire for a short 7 French sheath which was then secured in place.  The patient was already on aspirin and clopidogrel as an outpatient. Final Impression: Successful right common iliac artery PTA and covered stenting using a 7 mm x 29 mm long VBX covered stent in the setting of lifestyle-limiting claudication.  The sheath will be removed once ACT falls below 170 and pressure held.  She will be hydrated overnight and discharged home in the morning on DAPT.  We will get aortoiliac/lower extremity arterial Doppler studies in unrefined office next week and I will see her back in the office 1 to 2 weeks thereafter.  She left the lab in stable condition. Quay Burow. MD, [facility] 07/26/2022 4:14 PM     Disposition   Pt is being discharged home today in good condition.  Follow-up Plans & Appointments     Follow-up [facility] Follow up on 08/02/2022.   Specialty: Cardiology Why: at 1pm for your follow up dopplers Contact information: [address] [place] [id number] [place] [address] [phone]        Lorretta Harp, MD Follow up on 08/03/2022.   Specialties: Cardiology, Radiology Why: at 1:30pm for your follow up appt Contact information: [address] [place] [address] [phone]                  Discharge Medications   Allergies as of 07/27/2022       Reactions   Codeine Itching        Medication List     TAKE these medications    aspirin EC 81 MG tablet Take 81 mg by mouth  daily.   atorvastatin 80 MG tablet Commonly known as: LIPITOR Take 1 tablet (80 mg total) by mouth daily at 6 PM.   carvedilol 6.25 MG tablet Commonly known as: COREG Take 1 tablet (6.25 mg total) by mouth 2 (two) times daily with a meal.   clonazePAM 1 MG tablet Commonly known as: KLONOPIN Take 1 mg by mouth 2 (two) times daily.   clopidogrel 75 MG tablet Commonly known as: PLAVIX Take 1 tablet (75 mg total) by mouth daily.   D3 High Potency 125 MCG (5000 UT) capsule Generic drug: Cholecalciferol Take 5,000 Units by mouth daily.   ezetimibe 10 MG tablet Commonly known as: ZETIA Take 1  tablet (10 mg total) by mouth daily.   Farxiga 10 MG Tabs tablet Generic drug: dapagliflozin propanediol Take 10 mg by mouth daily.   nitroGLYCERIN 0.4 MG SL tablet Commonly known as: NITROSTAT Place 0.4 mg under the tongue as needed for chest pain.   olmesartan 20 MG tablet Commonly known as: BENICAR Take 1 tablet (20 mg total) by mouth daily.   Ozempic (2 MG/DOSE) 8 MG/3ML Sopn Generic drug: Semaglutide (2 MG/DOSE) Inject 2 mg into the skin once a week.   pantoprazole 40 MG tablet Commonly known as: PROTONIX Take 1 tablet (40 mg total) by mouth daily.   pregabalin 150 MG capsule Commonly known as: LYRICA Take 150 mg by mouth 2 (two) times daily.   sertraline 100 MG tablet Commonly known as: ZOLOFT Take 100 mg by mouth daily.   spironolactone 25 MG tablet Commonly known as: ALDACTONE Take 1 tablet (25 mg total) by mouth daily.        Outstanding Labs/Studies   Follow up dopplers   Duration of Discharge Encounter   Greater than 30 minutes including physician time.  Signed, Reino Bellis, NP 07/27/2022, 8:21 AM   Patient seen and examined. Agree with assessment and plan.  Patient feels well.  No chest pain since her July 2023 intervention.  She underwent successful right common iliac artery PTA with covered stenting using a 7 mm x 29 mm long VBX covered stent  setting of lifestyle limiting claudication.  Right groin catheterization site is stable.  Pulses are excellent.  Pressure stable.  Telemetry reveals sinus rhythm in the 60s.  Plan to continue aggressive lipid management and blood pressure control.  Plan to discharge today with follow-up with Dr. Alvester Chou.   Troy Sine, MD, [facility] 07/27/2022 10:03 AM

## 2022-07-30 ENCOUNTER — Other Ambulatory Visit (HOSPITAL_COMMUNITY): Payer: Self-pay | Admitting: Cardiovascular Disease

## 2022-07-30 DIAGNOSIS — Z95828 Presence of other vascular implants and grafts: Secondary | ICD-10-CM

## 2022-08-02 ENCOUNTER — Ambulatory Visit
Admission: RE | Admit: 2022-08-02 | Discharge: 2022-08-02 | Disposition: A | Discharge status: Home / Self Care | Payer: Medicaid Other | Source: Ambulatory Visit | Attending: Internal Medicine | Admitting: Internal Medicine

## 2022-08-02 ENCOUNTER — Ambulatory Visit (HOSPITAL_COMMUNITY)
Admission: RE | Admit: 2022-08-02 | Discharge: 2022-08-02 | Disposition: A | Discharge status: Home / Self Care | Payer: Medicaid Other | Source: Ambulatory Visit | Attending: Internal Medicine | Admitting: Internal Medicine

## 2022-08-02 DIAGNOSIS — Z95828 Presence of other vascular implants and grafts: Secondary | ICD-10-CM | POA: Diagnosis present

## 2022-08-02 DIAGNOSIS — I739 Peripheral vascular disease, unspecified: Secondary | ICD-10-CM | POA: Diagnosis not present

## 2022-08-03 ENCOUNTER — Ambulatory Visit: Payer: Medicaid Other | Admitting: Cardiovascular Disease

## 2022-08-13 ENCOUNTER — Telehealth (HOSPITAL_COMMUNITY): Payer: Self-pay

## 2022-08-13 ENCOUNTER — Telehealth: Payer: Self-pay | Admitting: Cardiovascular Disease

## 2022-08-13 NOTE — Telephone Encounter (Signed)
Called and spoke with [facility] at Dr. Claiborne Billings office in regards to the Medicaid order. She stated she does not see it on her end. She provided me with a diff fax number [phone]. Will fax form to new fax number and place pt ppw in f/u folder 10/10.

## 2022-08-13 NOTE — Telephone Encounter (Signed)
Spoke with Janett Billow at cardiac rehab, we have not gotten the fax. She reports they just resent it.

## 2022-08-13 NOTE — Telephone Encounter (Signed)
Caller stated they will need form for [facility] Reimbursement completed.  Caller noted this form was faxed previously.

## 2022-08-17 ENCOUNTER — Ambulatory Visit: Payer: Medicaid Other | Attending: Cardiovascular Disease | Admitting: Cardiovascular Disease

## 2022-08-18 ENCOUNTER — Telehealth: Payer: Self-pay

## 2022-08-23 NOTE — Telephone Encounter (Signed)
Encounter error

## 2022-08-25 ENCOUNTER — Telehealth: Payer: Self-pay | Admitting: *Deleted

## 2022-08-25 NOTE — Telephone Encounter (Signed)
Called patient for 12 week phone call follow up left message for her to call me back.     [place] Gabriellah Rabel,research Coordinator 08/25/2022  14:59 p.m.

## 2022-09-01 ENCOUNTER — Other Ambulatory Visit: Payer: Self-pay | Admitting: Internal Medicine

## 2022-09-22 ENCOUNTER — Encounter: Payer: Self-pay | Admitting: *Deleted

## 2022-09-22 DIAGNOSIS — Z006 Encounter for examination for normal comparison and control in clinical research program: Secondary | ICD-10-CM

## 2022-09-22 NOTE — Research (Signed)
Follow-Up Visit Completed*   []  Not Necessary, No Potential Adverse Events Or Medication Issues Reported On Completed Subject Questionnaire   []  Yes, Contact With Subject/Alternate Contact Completed   [x]  Yes, No Contact With Subject/Alternate Contact Completed, But Electronic Health Record Was Reviewed   []  No, Unable To Contact Subject/Alternate Contact   Have you reviewed Ongoing medications on the Targeted Concomitant Medication form and updated the form as needed?   [x]  Yes   []  No   Subject Status*   []  Continuing In Follow-up   [x]  At Risk For Lost To Follow-up   []  Withdrawal From All Future Study Activities Including Passive Follow-up By Electronic Health Record Review Or Contact With Healthcare Provider Or Family Member/Friend   []  Death   Vital Status*   [x]  Alive   []  Deceased   []  Unknown   Last Known To Be Alive Source*   []  Subject Completed Follow-up Questionnaire/Seen In Person/Via Telephone Contact   []  Family Member or Caretaker   [x]  Primary Physician Or Medical Records   []  Publicly Available Source   []  Other  Date of last dose taken   DAY/MONTH/YEAR N/A  Over the last 12 weeks did the subject miss any doses? N/A  Over the last 12 weeks did the subject restart Evolocumab after an interruption? N/A

## 2022-10-04 ENCOUNTER — Other Ambulatory Visit (HOSPITAL_COMMUNITY): Payer: Self-pay | Admitting: Cardiovascular Disease

## 2022-10-04 DIAGNOSIS — I739 Peripheral vascular disease, unspecified: Secondary | ICD-10-CM

## 2022-10-21 ENCOUNTER — Ambulatory Visit: Payer: Medicaid Other | Admitting: Cardiovascular Disease

## 2022-10-21 ENCOUNTER — Ambulatory Visit: Payer: Medicaid Other | Attending: Cardiovascular Disease | Admitting: Cardiovascular Disease

## 2022-10-21 ENCOUNTER — Encounter: Payer: Self-pay | Admitting: Cardiovascular Disease

## 2022-10-21 DIAGNOSIS — E782 Mixed hyperlipidemia: Secondary | ICD-10-CM

## 2022-10-21 DIAGNOSIS — I5181 Takotsubo syndrome: Secondary | ICD-10-CM

## 2022-10-21 DIAGNOSIS — E785 Hyperlipidemia, unspecified: Secondary | ICD-10-CM

## 2022-10-21 DIAGNOSIS — I739 Peripheral vascular disease, unspecified: Secondary | ICD-10-CM | POA: Diagnosis not present

## 2022-10-21 DIAGNOSIS — I2510 Atherosclerotic heart disease of native coronary artery without angina pectoris: Secondary | ICD-10-CM

## 2022-10-21 DIAGNOSIS — I10 Essential (primary) hypertension: Secondary | ICD-10-CM | POA: Diagnosis not present

## 2022-10-21 DIAGNOSIS — K219 Gastro-esophageal reflux disease without esophagitis: Secondary | ICD-10-CM

## 2022-10-21 DIAGNOSIS — E118 Type 2 diabetes mellitus with unspecified complications: Secondary | ICD-10-CM

## 2022-10-21 MED ORDER — VASCEPA 1 G PO CAPS: 1.0000 g | ORAL_CAPSULE | Freq: Two times a day (BID) | ORAL | 3 refills | Status: DC

## 2022-10-21 NOTE — Progress Notes (Signed)
Cardiology Office Note    Date:  10/28/2022   ID:  Alice Bennett, DOB [birth year], MRN [id number]  PCP:  Merryl Hacker, No  Cardiologist:  Shelva Majestic, MD   No chief complaint on file.  19 month follow-up evaluation  History of Present Illness:  Alice Bennett is a 61 y.o. female who I had seen when she was hospitalized in September 2018.  She has a history of known CAD and in 2014 underwent bare-metal stenting of her mid LAD with a 3.0 x 15 mm MultiLink vision stent at [facility].  She presented to the hospital on July 09, 2017 with chest pain after tripping in a ditch.  An echocardiogram at [facility] showed an EF of 35 to 40% with hypokinesis of the mid apical, anteroseptal anterior inferolateral inferior inferoseptal myocardium with grade 2 diastolic dysfunction.  She was transferred to [facility] and underwent cardiac catheterization which showed 30% proximal RCA lesion, patent proximal LAD stent, 70% mid LAD lesion with FFR of 0.79 treated with a DES stent and there was 50% proximal to mid circumflex stenosis.  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  There was an initial attempt of adding spironolactone but this was discontinued due to increasing creatinine.  She apparently was seen on July 27, 2017 in the office by Almyra Deforest, PA for follow-up evaluation she was on aspirin and Plavix had not developed any recurrent symptomatology.  She was also on lisinopril and due to low blood pressure this was reduced to 2.5 mg daily.  She also has a history of diabetes mellitus, hyperlipidemia.  She has not been seen in our by our group in over 2 years.  She apparently recently saw Katheran Awe, NP who advised cardiology follow-up evaluation.  I saw her in June 2020 at which time she denied exertional chest pain.  She  admitted to rare episodes of sharp twinges of chest discomfort.  She was on atorvastatin 80 mg for hyperlipidemia.  She continued to be on dual  antiplatelet therapy with aspirin and Plavix.  She was on spironolactone 12.5 mg daily and lisinopril 2.5 mg in addition to carvedilol 6.25 mg twice a day with her LV dysfunction.  She is diabetic on Farxiga and metformin, pantoprazole for GERD and continues to be on Lyrica for peripheral neuropathy.    Isaw her on May 06, 2020 and over the prior year she continued to remain stable.  She was experiencing rare nonexertional chest pain in the costochondral region which was short-lived and not felt to be ischemic in etiology.   She was evaluated by Almyra Deforest, PA on January 06, 2021 that time had experienced 2 short-lived episodes of chest discomfort which was somewhat different than her costochondral discomfort.  Her symptoms were not exertionally precipitated and lasted approximately 15 minutes before resolving.  She was referred for a Lexiscan Myoview study which was low risk and showed a small mild defect in the basal inferoseptal and mid inferoseptal location that was nonreversible.  EF was 62%.  I last saw her on Mar 09, 2021.  At that time she was on a medical regimen of carvedilol 6.25 mg twice a day spironolactone 12.5 mg daily in addition to lisinopril 5 mg for hypertension.  She continued to be on atorvastatin 80 mg, lovaza 2 capsules twice a day and Zetia 10 mg for mixed hyperlipidemia with target LDL less than 70.  She is diabetic on metformin 500 mg twice a day  and Farxiga 10 mg daily.  She  had some GI issues and is on pantoprazole 40 mg.  Recently she  had some abdominal bloating.  She was scheduled to see a gastroenterologist in [place].  She has been followed by Dr. Heide Scales.  She  admits to a dry cough.  She has not been successful with weight loss.    Since I saw her, she has continued to be followed at [facility].  She feels better.  She has been on Zetia 10 mg and atorvastatin 80 mg for hyperlipidemia.  She continues to be on DAPT with aspirin/clopidogrel.  She has been taking  olmesartan 20 mg and spironolactone 25 mg daily and carvedilol 6.25 mg twice a day hypertension.  She was just started on [facility] and remotely was on Farxiga which had been discontinued.  She is on pantoprazole for GERD and takes pregabalin for neuropathy.  She presents for evaluation.    Past Medical History:  Diagnosis Date   CAD (coronary artery disease)    07/10/17 PCI/DES to LAD, EF 35-40%   Diabetes mellitus without complication (HCC)    Hyperlipidemia    Hypertension    NSTEMI (non-ST elevated myocardial infarction) [facility])     Past Surgical History:  Procedure Laterality Date   ABDOMINAL AORTOGRAM W/LOWER EXTREMITY N/A 07/26/2022   Procedure: ABDOMINAL AORTOGRAM W/LOWER EXTREMITY;  Surgeon: Lorretta Harp, MD;  Location: [place] [facility];  Service: Cardiovascular;  Laterality: N/A;   CORONARY STENT INTERVENTION N/A 07/10/2017   Procedure: CORONARY STENT INTERVENTION;  Surgeon: Martinique, Peter M, MD;  Location: [place] [facility];  Service: Cardiovascular;  Laterality: N/A;   CORONARY STENT INTERVENTION N/A 05/31/2022   Procedure: CORONARY STENT INTERVENTION;  Surgeon: Nelva Bush, MD;  Location: [place] [facility];  Service: Cardiovascular;  Laterality: N/A;   INTRAVASCULAR PRESSURE WIRE/FFR STUDY N/A 07/10/2017   Procedure: INTRAVASCULAR PRESSURE WIRE/FFR STUDY;  Surgeon: Martinique, Peter M, MD;  Location: [place] [facility];  Service: Cardiovascular;  Laterality: N/A;   INTRAVASCULAR PRESSURE WIRE/FFR STUDY N/A 05/31/2022   Procedure: INTRAVASCULAR PRESSURE WIRE/FFR STUDY;  Surgeon: Nelva Bush, MD;  Location: [place] [facility];  Service: Cardiovascular;  Laterality: N/A;   LEFT HEART CATH AND CORONARY ANGIOGRAPHY N/A 07/10/2017   Procedure: LEFT HEART CATH AND CORONARY ANGIOGRAPHY;  Surgeon: Martinique, Peter M, MD;  Location: [place] [facility];  Service: Cardiovascular;  Laterality: N/A;   PERIPHERAL VASCULAR INTERVENTION  07/26/2022   Procedure: PERIPHERAL VASCULAR INTERVENTION;   Surgeon: Lorretta Harp, MD;  Location: [place] [facility];  Service: Cardiovascular;;   RIGHT/LEFT HEART CATH AND CORONARY ANGIOGRAPHY N/A 05/31/2022   Procedure: RIGHT/LEFT HEART CATH AND CORONARY ANGIOGRAPHY;  Surgeon: Nelva Bush, MD;  Location: [place] [facility];  Service: Cardiovascular;  Laterality: N/A;    Current Medications: Outpatient Medications Prior to Visit  Medication Sig Dispense Refill   aspirin EC 81 MG tablet Take 81 mg by mouth daily.     atorvastatin (LIPITOR) 80 MG tablet Take 1 tablet (80 mg total) by mouth daily at 6 PM. 30 tablet 0   carvedilol (COREG) 6.25 MG tablet Take 1 tablet (6.25 mg total) by mouth 2 (two) times daily with a meal. 60 tablet 0   Cholecalciferol (D3 HIGH POTENCY) 125 MCG (5000 UT) capsule Take 5,000 Units by mouth daily.     clonazePAM (KLONOPIN) 1 MG tablet Take 1 mg by mouth 2 (two) times daily.     clopidogrel (PLAVIX) 75  MG tablet Take 1 tablet (75 mg total) by mouth daily. 30 tablet 0   ezetimibe (ZETIA) 10 MG tablet Take 1 tablet (10 mg total) by mouth daily. 90 tablet 3   FARXIGA 10 MG TABS tablet Take 10 mg by mouth daily.      MOUNJARO 5 MG/0.5ML Pen Inject into the skin.     nitroGLYCERIN (NITROSTAT) 0.4 MG SL tablet Place 0.4 mg under the tongue as needed for chest pain.     olmesartan (BENICAR) 20 MG tablet Take 1 tablet (20 mg total) by mouth daily. 90 tablet 3   pantoprazole (PROTONIX) 40 MG tablet Take 1 tablet (40 mg total) by mouth daily. 30 tablet 0   pregabalin (LYRICA) 150 MG capsule Take 150 mg by mouth 2 (two) times daily.     sertraline (ZOLOFT) 100 MG tablet Take 100 mg by mouth daily.     spironolactone (ALDACTONE) 25 MG tablet Take 1 tablet (25 mg total) by mouth daily. 90 tablet 3   OZEMPIC, 2 MG/DOSE, 8 MG/3ML SOPN Inject 2 mg into the skin once a week.     No facility-administered medications prior to visit.     Allergies:   Codeine   Social History   Socioeconomic History   Marital status: Single     Spouse name: Not on file   Number of children: Not on file   Years of education: Not on file   Highest education level: Not on file  Occupational History   Not on file  Tobacco Use   Smoking status: Every Day    Packs/day: 0.50    Years: 10.00    Total pack years: 5.00    Types: Cigarettes   Smokeless tobacco: Never  Vaping Use   Vaping Use: Never used  Substance and Sexual Activity   Alcohol use: No   Drug use: No   Sexual activity: Not on file  Other Topics Concern   Not on file  Social History Narrative   Not on file   Social Determinants of Health   Financial Resource Strain: Not on file  Food Insecurity: No Food Insecurity (07/27/2022)   Hunger Vital Sign    Worried About Running Out of Food in the Last Year: Never true    Ran Out of Food in the Last Year: Never true  Transportation Needs: No Transportation Needs (07/27/2022)   PRAPARE - Hydrologist (Medical): No    Lack of Transportation (Non-Medical): No  Physical Activity: Not on file  Stress: Not on file  Social Connections: Not on file     Family History:  The patient's family history is not on file.   ROS General: Negative; No fevers, chills, or night sweats;  HEENT: Negative; No changes in vision or hearing, sinus congestion, difficulty swallowing Pulmonary: Negative; No cough, wheezing, shortness of breath, hemoptysis Cardiovascular: Negative; No chest pain, presyncope, syncope, palpitations GI: GERD; abdominal bloating GU: Negative; No dysuria, hematuria, or difficulty voiding Musculoskeletal: Negative; no myalgias, joint pain, or weakness Hematologic/Oncology: Negative; no easy bruising, bleeding Endocrine: Positive for diabetes Neuro: Positive for peripheral neuropathy Skin: Negative; No rashes or skin lesions Psychiatric: Negative; No behavioral problems, depression Sleep: Negative; No snoring, daytime sleepiness, hypersomnolence, bruxism, restless legs, hypnogognic  hallucinations, no cataplexy Other comprehensive 14 point system review is negative.   PHYSICAL EXAM:   VS:  BP 126/84   Pulse 72   Ht _0  (1.753 m)   Wt 226 lb 3.2 oz (  102.6 kg)   SpO2 98%   BMI 33.40 kg/m     Repeat blood pressure by me was 110/76  Wt Readings from Last 3 Encounters:  10/21/22 226 lb 3.2 oz (102.6 kg)  07/26/22 224 lb (101.6 kg)  07/07/22 224 lb (101.6 kg)   General: Alert, oriented, no distress.  Skin: normal turgor, no rashes, warm and dry HEENT: Normocephalic, atraumatic. Pupils equal round and reactive to light; sclera anicteric; extraocular muscles intact;  Nose without nasal septal hypertrophy Mouth/Parynx benign; Mallinpatti scale 3 Neck: No JVD, no carotid bruits; normal carotid upstroke Lungs: clear to ausculatation and percussion; no wheezing or rales Chest wall: without tenderness to palpitation Heart: PMI not displaced, RRR, s1 s2 normal, 1/6 systolic murmur, no diastolic murmur, no rubs, gallops, thrills, or heaves Abdomen: soft, nontender; no hepatosplenomehaly, BS+; abdominal aorta nontender and not dilated by palpation. Back: no CVA tenderness Pulses 2+ Musculoskeletal: full range of motion, normal strength, no joint deformities Extremities: no clubbing cyanosis or edema, Homan's sign negative  Neurologic: grossly nonfocal; Cranial nerves grossly wnl Psychologic: Normal mood and affect   Studies/Labs Reviewed:   October 21, 2022 ECG (independently read by me):  NSR at 72, low voltage, QS V1-2  Mar 09, 2021 ECG (independently read by me): NSR at 63; no ectopy, normal intervals  April 30, 2019 ECG (independently read by me): NSR at 60; resolution of prior anterior T wave inversion since 07/28/2017  Recent Labs:    Latest Ref Rng & Units 07/27/2022    5:53 AM 07/07/2022    3:31 PM 06/16/2022    3:38 PM  BMP  Glucose 70 - 99 mg/dL 142  101  122   BUN 6 - 20 mg/dL _0 Creatinine 0.44 - 1.00 mg/dL 1.08  0.81  1.03   BUN/Creat  Ratio 12 - _1 Sodium 135 - 145 mmol/L 141  142  141   Potassium 3.5 - 5.1 mmol/L 3.3  4.6  4.7   Chloride 98 - 111 mmol/L 109  102  102   CO2 22 - 32 mmol/L _2 Calcium 8.9 - 10.3 mg/dL 8.6  9.6  9.7         Latest Ref Rng & Units 06/09/2020    3:31 AM 05/30/2020   12:23 PM 07/09/2017    4:43 PM  Hepatic Function  Total Protein 6.5 - 8.1 g/dL 7.4  7.5  7.1   Albumin 3.5 - 5.0 g/dL 3.6  4.5  3.4   AST 15 - 41 U/L _3 ALT 0 - 44 U/L _4 Alk Phosphatase 38 - 126 U/L 73  97  53   Total Bilirubin 0.3 - 1.2 mg/dL 0.7  0.3  0.3        Latest Ref Rng & Units 07/27/2022    5:53 AM 07/07/2022    3:31 PM 06/01/2022    1:53 AM  CBC  WBC 4.0 - 10.5 K/uL 6.4  7.8  9.2   Hemoglobin 12.0 - 15.0 g/dL 12.9  13.8  12.7   Hematocrit 36.0 - 46.0 % 39.2  41.8  37.2   Platelets 150 - 400 K/uL 162  192  187    Lab Results  Component Value Date   MCV 87.9 07/27/2022   MCV 86 07/07/2022   MCV 84.2 06/01/2022   Lab Results  Component Value Date   TSH 2.980 05/30/2020   Lab Results  Component Value Date   HGBA1C 6.5 (H) 05/30/2022     BNP No results found for: "BNP"  ProBNP No results found for: "PROBNP"   Lipid Panel     Component Value Date/Time   CHOL 161 07/27/2022 0553   CHOL 207 (H) 05/30/2020 1223   TRIG 226 (H) 07/27/2022 0553   HDL 25 (L) 07/27/2022 0553   HDL 35 (L) 05/30/2020 1223   CHOLHDL 6.4 07/27/2022 0553   VLDL 45 (H) 07/27/2022 0553   LDLCALC 91 07/27/2022 0553   LDLCALC 123 (H) 05/30/2020 1223     RADIOLOGY: No results found.   Additional studies/ records that were reviewed today include:  I reviewed her records from her hospitalization in September 2018 including catheterization report, echo Doppler data, and subsequent evaluation with Almyra Deforest, PA-C  Prox RCA lesion, 35 %stenosed. Prox LAD lesion, 0 %stenosed. Prox Cx to Mid Cx lesion, 50 %stenosed. A STENT SIERRA 3.00 X 15 MM drug eluting stent was successfully  placed, and does not overlap previously placed stent. Mid LAD lesion, 70 %stenosed. Post intervention, there is a 0% residual stenosis. There is moderate left ventricular systolic dysfunction. The left ventricular ejection fraction is 35-45% by visual estimate. LV end diastolic pressure is severely elevated.   1. Single vessel obstructive CAD with 70% mid LAD. FFR 0.79. Stent in proximal LAD is patent 2. Severe LV dysfunction. Wall motion abnormality is more consistent with Takotsubo pattern 3. Markedly elevated LVEDP 4. Successful stenting of the mid LAD with DES.   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 The mid LAD stenosis was not critical and is unlikely to have caused these findings. It was borderline obstructive with abnormal FFR so we did proceed with stenting of this lesion. Will continue DAPT for one year. IV diuresis. Optimize BP control with beta blocker and ACEi.       ASSESSMENT:    1. CAD in native artery   2. Takotsubo cardiomyopathy   3. Primary hypertension   4. Hyperlipidemia LDL goal <70   5. Mixed hyperlipidemia   6. PAD (peripheral artery disease) ([place])   7. Type 2 diabetes mellitus with complication, without long-term current use of insulin ([place])   8. Gastroesophageal reflux disease without esophagitis     PLAN:   1.  CAD: In 2014 she underwent bare-metal stenting of her LAD at [facility].  On July 09, 2017 she underwent stenting of her mid LAD a patent proximal LAD stent and 50% circumflex and 35% RCA stenoses.  Currently she remained stable without recurrent anginal symptomatology and continues to be on DAPT with aspirin/Plavix.  She is on carvedilol 6.25 mg twice a day olmesartan 20 mg daily and spironolactone 25 mg daily with stable blood pressure and cardiac rhythm.  2.  Takotsubo cardiomyopathy: Her clinical presentation, ECG findings, and LV abnormality were felt to be due to  the syndrome in September 2018.  ECG at that time showed EF at 35 to 40%.  Subsequent ECG in July 2021 confirmed normalization of LV function with EF 60 to 65% with mild LVH of the basal septal segment with grade 1 diastolic dysfunction and mild aortic insufficiency.  ECG today is stable.   3.  Atypical chest pain: She has a history of musculoskeletal chest pain and costochondral tenderness on palpation of her chest wall.  She was reevaluated by Almyra Deforest  on January 06, 2021.  A nuclear perfusion study in March 2022 was low risk with nuclear stress EF at 62%.  There were no ECG changes.  A small fixed perfusion defect was noted in the basal to mid inferoseptal region without ischemia.  4.  Type 2 diabetes mellitus with complication: She previously was on Farxiga and metformin.  Currently Wilder Glade has been discontinued and she was just started yesterday on Mounjaro injection   5.  Essential hypertension: At her last evaluation in May 2022 lisinopril was discontinued and she was switched to olmesartan.  Blood pressure today is stable on carvedilol, olmesartan, and spironolactone.  6.  Hyperlipidemia: LDL goal less than 70, and she has been on atorvastatin 80 mg and Zetia 10 mg.  Most recent laboratory in September 2023 showed total cholesterol 161, HDL 25, LDL 91, and triglycerides were elevated at 226.  I recommended the addition of Vascepa 1 capsule twice a day if triglycerides remain elevated to increase to 2 capsules twice a day.  In 3 months I will recheck a comprehensive metabolic panel, fasting lipids, and will also check an LP(a).  7.  Peripheral neuropathy: She continues to be on pregabalin.  8.  GERD: Stable on pantoprazole   I will see her in 6 months for follow-up evaluation    Medication Adjustments/Labs and Tests Ordered: Current medicines are reviewed at length with the patient today.  Concerns regarding medicines are outlined above.  Medication changes, Labs and Tests ordered today are  listed in the Patient Instructions below. Patient Instructions  Medication Instructions:    Start taking Vascepa 1 gm  twice a day   *If you need a refill on your cardiac medications before your next appointment, please call your pharmacy*   Lab Work:fasting  3 months   CMP LIPID  If you have labs (blood work) drawn today and your tests are completely normal, you will receive your results only by: [place] (if you have MyChart) OR A paper copy in the mail If you have any lab test that is abnormal or we need to change your treatment, we will call you to review the results.   Testing/Procedures:    Follow-Up: At [facility], you and your health needs are our priority.  As part of our continuing mission to provide you with exceptional heart care, we have created designated Provider Care Teams.  These Care Teams include your primary Cardiologist (physician) and Advanced Practice Providers (APPs -  Physician Assistants and Nurse Practitioners) who all work together to provide you with the care you need, when you need it.     Your next appointment:   6 month(s)  The format for your next appointment:   In Person  Provider:   Shelva Majestic, MD  You have been referred to  CVRR in 3 months if lab lipids  are able normal    Other Instructions    Signed, Shelva Majestic, MD  10/28/2022 12:18 PM    [place] [address] [place], [place], [place]  [address] Phone: [phone]

## 2022-10-21 NOTE — Patient Instructions (Signed)
Medication Instructions:    Start taking Vascepa 1 gm  twice a day   *If you need a refill on your cardiac medications before your next appointment, please call your pharmacy*   Lab Work:fasting  3 months   CMP LIPID  If you have labs (blood work) drawn today and your tests are completely normal, you will receive your results only by: MyChart Message (if you have MyChart) OR A paper copy in the mail If you have any lab test that is abnormal or we need to change your treatment, we will call you to review the results.   Testing/Procedures:    Follow-Up: At [facility], you and your health needs are our priority.  As part of our continuing mission to provide you with exceptional heart care, we have created designated Provider Care Teams.  These Care Teams include your primary Cardiologist (physician) and Advanced Practice Providers (APPs -  Physician Assistants and Nurse Practitioners) who all work together to provide you with the care you need, when you need it.     Your next appointment:   6 month(s)  The format for your next appointment:   In Person  Provider:   Nicki Guadalaj[place], MD  You have been referred to  CVRR in 3 months if lab lipids  are able normal    Other Instructions

## 2022-10-28 ENCOUNTER — Encounter: Payer: Self-pay | Admitting: Cardiovascular Disease

## 2022-12-09 ENCOUNTER — Telehealth: Payer: Self-pay | Admitting: *Deleted

## 2022-12-09 NOTE — Telephone Encounter (Signed)
Called patient for 24 week phone call for the EVOLVE STUDY. Left message for patient to call me back.  Told patient if she wanted me to follow her by medical records only just to call and let me know.     Alice Bennett, Research Coordinator  12/09/2022  16:58 pm

## 2022-12-15 ENCOUNTER — Encounter (HOSPITAL_COMMUNITY): Payer: Self-pay

## 2022-12-15 ENCOUNTER — Telehealth (HOSPITAL_COMMUNITY): Payer: Self-pay

## 2022-12-15 NOTE — Telephone Encounter (Signed)
Pt is not interested in the cardiac rehab program. Closed referral 

## 2023-01-31 ENCOUNTER — Telehealth: Payer: Self-pay | Admitting: Cardiovascular Disease

## 2023-01-31 NOTE — Telephone Encounter (Signed)
Alice Harp, MD  Fidel Levy, RN Caller: Unspecified (Today,  1:26 PM) I haven't seen her since I did her right iliac intervention over 6 months ago. We can bring her into the office for a duplex  JJB

## 2023-01-31 NOTE — Telephone Encounter (Signed)
Patient called stating she is having pain in her right hip down to her leg in her groin area. She states she is having trouble walking. She states it started yesterday.  She states this is the same type of pain she had before she had a stent done.

## 2023-01-31 NOTE — Telephone Encounter (Signed)
Spoke with patient of Dr. Claiborne Billings who reports right groin pain, radiating down leg since yesterday. She said this pain is similar to what she had prior to stents in the past. She has known PAD  She is due for ABI/Aorta/IVCs/Iliacs. Message sent to scheduler to contact for appointment. Routed to MD

## 2023-02-02 NOTE — Telephone Encounter (Signed)
Attempted to call patient in follow up x2 - "call cannot be completed at this time message". Of note, she is scheduled 02/21/23 for doppler. Lauretta Chester said she could contact her if there were cancellations

## 2023-02-04 ENCOUNTER — Emergency Department (HOSPITAL_COMMUNITY): Payer: Medicaid Other

## 2023-02-04 ENCOUNTER — Inpatient Hospital Stay (HOSPITAL_COMMUNITY)
Admission: EM | Admit: 2023-02-04 | Discharge: 2023-02-06 | DRG: 271 | Disposition: A | Discharge status: Home / Self Care | Payer: Medicaid Other | Attending: Internal Medicine | Admitting: Internal Medicine

## 2023-02-04 ENCOUNTER — Telehealth: Payer: Self-pay | Admitting: Cardiovascular Disease

## 2023-02-04 ENCOUNTER — Encounter (HOSPITAL_COMMUNITY): Payer: Self-pay

## 2023-02-04 ENCOUNTER — Other Ambulatory Visit: Payer: Self-pay

## 2023-02-04 DIAGNOSIS — Z79899 Other long term (current) drug therapy: Secondary | ICD-10-CM

## 2023-02-04 DIAGNOSIS — M79604 Pain in right leg: Principal | ICD-10-CM

## 2023-02-04 DIAGNOSIS — I255 Ischemic cardiomyopathy: Secondary | ICD-10-CM | POA: Diagnosis present

## 2023-02-04 DIAGNOSIS — I10 Essential (primary) hypertension: Secondary | ICD-10-CM | POA: Diagnosis not present

## 2023-02-04 DIAGNOSIS — K219 Gastro-esophageal reflux disease without esophagitis: Secondary | ICD-10-CM | POA: Diagnosis present

## 2023-02-04 DIAGNOSIS — E1151 Type 2 diabetes mellitus with diabetic peripheral angiopathy without gangrene: Secondary | ICD-10-CM | POA: Diagnosis present

## 2023-02-04 DIAGNOSIS — Z7902 Long term (current) use of antithrombotics/antiplatelets: Secondary | ICD-10-CM

## 2023-02-04 DIAGNOSIS — Z7985 Long-term (current) use of injectable non-insulin antidiabetic drugs: Secondary | ICD-10-CM

## 2023-02-04 DIAGNOSIS — Z992 Dependence on renal dialysis: Secondary | ICD-10-CM

## 2023-02-04 DIAGNOSIS — I252 Old myocardial infarction: Secondary | ICD-10-CM

## 2023-02-04 DIAGNOSIS — F17210 Nicotine dependence, cigarettes, uncomplicated: Secondary | ICD-10-CM | POA: Diagnosis present

## 2023-02-04 DIAGNOSIS — E1169 Type 2 diabetes mellitus with other specified complication: Secondary | ICD-10-CM | POA: Diagnosis present

## 2023-02-04 DIAGNOSIS — I739 Peripheral vascular disease, unspecified: Secondary | ICD-10-CM | POA: Diagnosis present

## 2023-02-04 DIAGNOSIS — T82868A Thrombosis of vascular prosthetic devices, implants and grafts, initial encounter: Principal | ICD-10-CM | POA: Diagnosis present

## 2023-02-04 DIAGNOSIS — K551 Chronic vascular disorders of intestine: Secondary | ICD-10-CM | POA: Diagnosis present

## 2023-02-04 DIAGNOSIS — I2510 Atherosclerotic heart disease of native coronary artery without angina pectoris: Secondary | ICD-10-CM | POA: Diagnosis present

## 2023-02-04 DIAGNOSIS — E785 Hyperlipidemia, unspecified: Secondary | ICD-10-CM | POA: Diagnosis not present

## 2023-02-04 DIAGNOSIS — Z7982 Long term (current) use of aspirin: Secondary | ICD-10-CM

## 2023-02-04 DIAGNOSIS — M25551 Pain in right hip: Secondary | ICD-10-CM | POA: Diagnosis present

## 2023-02-04 DIAGNOSIS — J449 Chronic obstructive pulmonary disease, unspecified: Secondary | ICD-10-CM | POA: Diagnosis present

## 2023-02-04 DIAGNOSIS — E7800 Pure hypercholesterolemia, unspecified: Secondary | ICD-10-CM | POA: Diagnosis present

## 2023-02-04 DIAGNOSIS — Z885 Allergy status to narcotic agent status: Secondary | ICD-10-CM

## 2023-02-04 DIAGNOSIS — R262 Difficulty in walking, not elsewhere classified: Secondary | ICD-10-CM | POA: Diagnosis present

## 2023-02-04 DIAGNOSIS — E876 Hypokalemia: Secondary | ICD-10-CM | POA: Diagnosis present

## 2023-02-04 DIAGNOSIS — Y831 Surgical operation with implant of artificial internal device as the cause of abnormal reaction of the patient, or of later complication, without mention of misadventure at the time of the procedure: Secondary | ICD-10-CM | POA: Diagnosis present

## 2023-02-04 DIAGNOSIS — I152 Hypertension secondary to endocrine disorders: Secondary | ICD-10-CM | POA: Diagnosis present

## 2023-02-04 DIAGNOSIS — I70221 Atherosclerosis of native arteries of extremities with rest pain, right leg: Secondary | ICD-10-CM | POA: Diagnosis present

## 2023-02-04 DIAGNOSIS — I745 Embolism and thrombosis of iliac artery: Secondary | ICD-10-CM | POA: Diagnosis present

## 2023-02-04 LAB — CBC WITH DIFFERENTIAL/PLATELET
Abs Immature Granulocytes: 0.02 10*3/uL (ref 0.00–0.07)
Basophils Absolute: 0 10*3/uL (ref 0.0–0.1)
Basophils Relative: 0 %
Eosinophils Absolute: 0.1 10*3/uL (ref 0.0–0.5)
Eosinophils Relative: 2 %
HCT: 43.4 % (ref 36.0–46.0)
Hemoglobin: 14.6 g/dL (ref 12.0–15.0)
Immature Granulocytes: 0 %
Lymphocytes Relative: 34 %
Lymphs Abs: 1.8 10*3/uL (ref 0.7–4.0)
MCH: 29.3 pg (ref 26.0–34.0)
MCHC: 33.6 g/dL (ref 30.0–36.0)
MCV: 87 fL (ref 80.0–100.0)
Monocytes Absolute: 0.5 10*3/uL (ref 0.1–1.0)
Monocytes Relative: 9 %
Neutro Abs: 2.9 10*3/uL (ref 1.7–7.7)
Neutrophils Relative %: 55 %
Platelets: 164 10*3/uL (ref 150–400)
RBC: 4.99 MIL/uL (ref 3.87–5.11)
RDW: 13.9 % (ref 11.5–15.5)
WBC: 5.3 10*3/uL (ref 4.0–10.5)
nRBC: 0 % (ref 0.0–0.2)

## 2023-02-04 LAB — BASIC METABOLIC PANEL
Anion gap: 10 (ref 5–15)
BUN: 12 mg/dL (ref 8–23)
CO2: 24 mmol/L (ref 22–32)
Calcium: 8.7 mg/dL — ABNORMAL LOW (ref 8.9–10.3)
Chloride: 102 mmol/L (ref 98–111)
Creatinine, Ser: 1.14 mg/dL — ABNORMAL HIGH (ref 0.44–1.00)
GFR, Estimated: 55 mL/min — ABNORMAL LOW (ref >60)
Glucose, Bld: 112 mg/dL — ABNORMAL HIGH (ref 70–99)
Potassium: 3.8 mmol/L (ref 3.5–5.1)
Sodium: 136 mmol/L (ref 135–145)

## 2023-02-04 LAB — APTT: aPTT: 32 seconds (ref 24–36)

## 2023-02-04 MED ORDER — ICOSAPENT ETHYL 1 G PO CAPS
1.0000 g | ORAL_CAPSULE | Freq: Two times a day (BID) | ORAL | Status: DC
  Administered 2023-02-05 – 2023-02-06 (×4): 1 g via ORAL
  Filled 2023-02-04 (×4): qty 1

## 2023-02-04 MED ORDER — MORPHINE SULFATE (PF) 4 MG/ML IV SOLN
6.0000 mg | Freq: Once | INTRAVENOUS | Status: AC
  Administered 2023-02-04: 6 mg via INTRAVENOUS
  Filled 2023-02-04: qty 2

## 2023-02-04 MED ORDER — CLOPIDOGREL BISULFATE 75 MG PO TABS
75.0000 mg | ORAL_TABLET | Freq: Every day | ORAL | Status: DC
  Administered 2023-02-05 – 2023-02-06 (×2): 75 mg via ORAL
  Filled 2023-02-04 (×2): qty 1

## 2023-02-04 MED ORDER — ASPIRIN 81 MG PO TBEC
81.0000 mg | DELAYED_RELEASE_TABLET | Freq: Every day | ORAL | Status: DC
  Administered 2023-02-05 – 2023-02-06 (×2): 81 mg via ORAL
  Filled 2023-02-04 (×2): qty 1

## 2023-02-04 MED ORDER — ONDANSETRON HCL 4 MG/2ML IJ SOLN: 4.0000 mg | Freq: Four times a day (QID) | INTRAMUSCULAR | Status: DC | PRN

## 2023-02-04 MED ORDER — INSULIN ASPART 100 UNIT/ML IJ SOLN
0.0000 [IU] | INTRAMUSCULAR | Status: DC
  Administered 2023-02-05: 1 [IU] via SUBCUTANEOUS
  Administered 2023-02-05: 2 [IU] via SUBCUTANEOUS
  Administered 2023-02-05: 3 [IU] via SUBCUTANEOUS
  Administered 2023-02-05: 2 [IU] via SUBCUTANEOUS
  Administered 2023-02-06: 3 [IU] via SUBCUTANEOUS
  Administered 2023-02-06: 2 [IU] via SUBCUTANEOUS

## 2023-02-04 MED ORDER — HEPARIN BOLUS VIA INFUSION
4000.0000 [IU] | Freq: Once | INTRAVENOUS | Status: DC
  Filled 2023-02-04: qty 4000

## 2023-02-04 MED ORDER — HEPARIN BOLUS VIA INFUSION
4000.0000 [IU] | Freq: Once | INTRAVENOUS | Status: AC
  Administered 2023-02-04: 4000 [IU] via INTRAVENOUS
  Filled 2023-02-04: qty 4000

## 2023-02-04 MED ORDER — EZETIMIBE 10 MG PO TABS
10.0000 mg | ORAL_TABLET | Freq: Every day | ORAL | Status: DC
  Administered 2023-02-05 – 2023-02-06 (×2): 10 mg via ORAL
  Filled 2023-02-04 (×2): qty 1

## 2023-02-04 MED ORDER — PANTOPRAZOLE SODIUM 40 MG PO TBEC
40.0000 mg | DELAYED_RELEASE_TABLET | Freq: Every day | ORAL | Status: DC
  Administered 2023-02-05 – 2023-02-06 (×2): 40 mg via ORAL
  Filled 2023-02-04 (×2): qty 1

## 2023-02-04 MED ORDER — PREGABALIN 25 MG PO CAPS
150.0000 mg | ORAL_CAPSULE | Freq: Two times a day (BID) | ORAL | Status: DC
  Administered 2023-02-05 – 2023-02-06 (×4): 150 mg via ORAL
  Filled 2023-02-04 (×4): qty 2

## 2023-02-04 MED ORDER — ATORVASTATIN CALCIUM 80 MG PO TABS
80.0000 mg | ORAL_TABLET | Freq: Every day | ORAL | Status: DC
  Administered 2023-02-05: 80 mg via ORAL
  Filled 2023-02-04: qty 1

## 2023-02-04 MED ORDER — IRBESARTAN 150 MG PO TABS
150.0000 mg | ORAL_TABLET | Freq: Every day | ORAL | Status: DC
  Administered 2023-02-05 – 2023-02-06 (×2): 150 mg via ORAL
  Filled 2023-02-04 (×2): qty 1

## 2023-02-04 MED ORDER — CLONAZEPAM 0.5 MG PO TABS
1.0000 mg | ORAL_TABLET | Freq: Two times a day (BID) | ORAL | Status: DC
  Administered 2023-02-05 – 2023-02-06 (×4): 1 mg via ORAL
  Filled 2023-02-04 (×4): qty 2

## 2023-02-04 MED ORDER — VITAMIN D 25 MCG (1000 UNIT) PO TABS
5000.0000 [IU] | ORAL_TABLET | Freq: Every day | ORAL | Status: DC
  Administered 2023-02-05 – 2023-02-06 (×2): 5000 [IU] via ORAL
  Filled 2023-02-04 (×2): qty 5

## 2023-02-04 MED ORDER — HEPARIN (PORCINE) 25000 UT/250ML-% IV SOLN
1700.0000 [IU]/h | INTRAVENOUS | Status: DC
  Administered 2023-02-04: 1400 [IU]/h via INTRAVENOUS
  Filled 2023-02-04: qty 250

## 2023-02-04 MED ORDER — HEPARIN (PORCINE) 25000 UT/250ML-% IV SOLN: 1400.0000 [IU]/h | INTRAVENOUS | Status: DC

## 2023-02-04 MED ORDER — ACETAMINOPHEN 325 MG PO TABS: 650.0000 mg | ORAL_TABLET | Freq: Four times a day (QID) | ORAL | Status: DC | PRN

## 2023-02-04 MED ORDER — LACTATED RINGERS IV SOLN: INTRAVENOUS | Status: DC

## 2023-02-04 MED ORDER — ACETAMINOPHEN 650 MG RE SUPP: 650.0000 mg | Freq: Four times a day (QID) | RECTAL | Status: DC | PRN

## 2023-02-04 MED ORDER — SPIRONOLACTONE 25 MG PO TABS
25.0000 mg | ORAL_TABLET | Freq: Every day | ORAL | Status: DC
  Administered 2023-02-05 – 2023-02-06 (×2): 25 mg via ORAL
  Filled 2023-02-04 (×2): qty 1

## 2023-02-04 MED ORDER — CARVEDILOL 6.25 MG PO TABS
6.2500 mg | ORAL_TABLET | Freq: Two times a day (BID) | ORAL | Status: DC
  Administered 2023-02-05 – 2023-02-06 (×3): 6.25 mg via ORAL
  Filled 2023-02-04 (×3): qty 1

## 2023-02-04 MED ORDER — ONDANSETRON HCL 4 MG PO TABS: 4.0000 mg | ORAL_TABLET | Freq: Four times a day (QID) | ORAL | Status: DC | PRN

## 2023-02-04 MED ORDER — IOHEXOL 350 MG/ML SOLN
100.0000 mL | Freq: Once | INTRAVENOUS | Status: AC | PRN
  Administered 2023-02-04: 100 mL via INTRAVENOUS

## 2023-02-04 MED ORDER — SERTRALINE HCL 100 MG PO TABS
100.0000 mg | ORAL_TABLET | Freq: Every day | ORAL | Status: DC
  Administered 2023-02-05 – 2023-02-06 (×2): 100 mg via ORAL
  Filled 2023-02-04 (×2): qty 1

## 2023-02-04 NOTE — ED Provider Notes (Signed)
[facility] Provider Note   CSN: [id number] Arrival date & time: 02/04/23  1304     History  Chief Complaint  Patient presents with   Rt Hip Pain   Leg Pain    Alice Bennett is a 62 y.o. female.  62 year old female presents with 1 week history of right leg pain.  History of vascular surgery with a stent in the past.  States that the pain is characterized as dull and worse when she stands up and walks.  Notes discoloration to her right foot.  Does take Plavix and has been compliant.  Denies any trauma.  No back pain.  No bowel or bladder dysfunction.  Tried to see her doctor unsuccessfully.       Home Medications Prior to Admission medications   Medication Sig Start Date End Date Taking? Authorizing Provider  aspirin EC 81 MG tablet Take 81 mg by mouth daily.    [provider]  atorvastatin (LIPITOR) 80 MG tablet Take 1 tablet (80 mg total) by mouth daily at 6 PM. 07/19/17   Troy Sine, MD  carvedilol (COREG) 6.25 MG tablet Take 1 tablet (6.25 mg total) by mouth 2 (two) times daily with a meal. 07/19/17   Troy Sine, MD  Cholecalciferol (D3 HIGH POTENCY) 125 MCG (5000 UT) capsule Take 5,000 Units by mouth daily.    [provider]  clonazePAM (KLONOPIN) 1 MG tablet Take 1 mg by mouth 2 (two) times daily.    [provider]  clopidogrel (PLAVIX) 75 MG tablet Take 1 tablet (75 mg total) by mouth daily. 07/19/17   Troy Sine, MD  ezetimibe (ZETIA) 10 MG tablet Take 1 tablet (10 mg total) by mouth daily. 06/22/22   Troy Sine, MD  FARXIGA 10 MG TABS tablet Take 10 mg by mouth daily.  03/28/19   [provider]  MOUNJARO 5 MG/0.5ML Pen Inject into the skin. 10/20/22   [provider]  nitroGLYCERIN (NITROSTAT) 0.4 MG SL tablet Place 0.4 mg under the tongue as needed for chest pain. 04/25/20   [provider]  olmesartan (BENICAR) 20 MG tablet Take 1 tablet (20 mg total)  by mouth daily. 03/09/21   Troy Sine, MD  pantoprazole (PROTONIX) 40 MG tablet Take 1 tablet (40 mg total) by mouth daily. 07/19/17   Troy Sine, MD  pregabalin (LYRICA) 150 MG capsule Take 150 mg by mouth 2 (two) times daily.    [provider]  sertraline (ZOLOFT) 100 MG tablet Take 100 mg by mouth daily.    [provider]  spironolactone (ALDACTONE) 25 MG tablet Take 1 tablet (25 mg total) by mouth daily. 06/09/22 09/07/22  Warren Lacy, PA-C  VASCEPA 1 g capsule Take 1 capsule (1 g total) by mouth 2 (two) times daily. 10/21/22   Troy Sine, MD      Allergies    Codeine    Review of Systems   Review of Systems  All other systems reviewed and are negative.   Physical Exam Updated Vital Signs BP 106/71 (BP Location: Right Arm)   Pulse 81   Temp 98.3 F (36.8 C) (Oral)   Resp 16   Ht 1.753 m (5\' 9" )   Wt 101.6 kg   SpO2 96%   BMI 33.08 kg/m  Physical Exam Vitals and nursing note reviewed.  Constitutional:      General: She is not in acute distress.  Appearance: Normal appearance. She is well-developed. She is not toxic-appearing.  HENT:     Head: Normocephalic and atraumatic.  Eyes:     General: Lids are normal.     Conjunctiva/sclera: Conjunctivae normal.     Pupils: Pupils are equal, round, and reactive to light.  Neck:     Thyroid: No thyroid mass.     Trachea: No tracheal deviation.  Cardiovascular:     Rate and Rhythm: Normal rate and regular rhythm.     Heart sounds: Normal heart sounds. No murmur heard.    No gallop.  Pulmonary:     Effort: Pulmonary effort is normal. No respiratory distress.     Breath sounds: Normal breath sounds. No stridor. No decreased breath sounds, wheezing, rhonchi or rales.  Abdominal:     General: There is no distension.     Palpations: Abdomen is soft.     Tenderness: There is no abdominal tenderness. There is no rebound.  Musculoskeletal:        General: No tenderness. Normal range of  motion.     Cervical back: Normal range of motion and neck supple.     Comments: Diminished dorsalis pedis pulse at right foot.  Some discoloration noted to her toes on the right foot.  Decree sensation of the right foot as well to.    Skin:    General: Skin is warm and dry.     Findings: No abrasion or rash.  Neurological:     Mental Status: She is alert and oriented to person, place, and time. Mental status is at baseline.     GCS: GCS eye subscore is 4. GCS verbal subscore is 5. GCS motor subscore is 6.     Cranial Nerves: No cranial nerve deficit.     Sensory: No sensory deficit.     Motor: Motor function is intact.  Psychiatric:        Attention and Perception: Attention normal.        Speech: Speech normal.        Behavior: Behavior normal.     ED Results / Procedures / Treatments   Labs (all labs ordered are listed, but only abnormal results are displayed) Labs Reviewed  CBC WITH DIFFERENTIAL/PLATELET  BASIC METABOLIC PANEL  APTT    EKG None  Radiology No results found.  Procedures Procedures    Medications Ordered in ED Medications  lactated ringers infusion (has no administration in time range)  morphine (PF) 4 MG/ML injection 6 mg (has no administration in time range)    ED Course/ Medical Decision Making/ A&P                             Medical Decision Making Amount and/or Complexity of Data Reviewed Labs: ordered. Radiology: ordered.  Risk Prescription drug management.   Discussed case with Dr. Alvester Chou from cardiology who agrees with plan to do CT angio of lower extremity.  Also discussed with Dr. Trula Slade from vascular surgery.  Both physicians have seen the patient.  Patient vascular study inconclusive.  Per Dr. Trula Slade, recommends patient be heparinized at this time.  Will admit to the medicine service        Final Clinical Impression(s) / ED Diagnoses Final diagnoses:  None    Rx / DC Orders ED Discharge Orders     None          Alice Leigh, MD 02/04/23 2205

## 2023-02-04 NOTE — Progress Notes (Signed)
CT scan reviewed with radiology.  Due to the timing of the contrast, visualization of the iliac stent was limited.  I discussed this with the patient.  She is unable to walk because of pain that begins in her right hip.  She also has pain in her toes.  I think the best neck step is to admit her for IV heparin with plans for angiography tomorrow.  I told her I would begin with a diagnostic study from her left groin.  Based on these results intervention, if needed would be performed, potentially requiring right groin access.  She wishes to proceed.  She will be n.p.o. after midnight.  [name]

## 2023-02-04 NOTE — Assessment & Plan Note (Signed)
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 Heparin gtt Looks like Dr. Trula Slade planning on formal angiography in OR tomorrow at 0900 Making pt NPO after MN Cont ASA + Plavix

## 2023-02-04 NOTE — Assessment & Plan Note (Signed)
Cont statin, zetia 

## 2023-02-04 NOTE — H&P (View-Only) (Signed)
CT scan reviewed with radiology.  Due to the timing of the contrast, visualization of the iliac stent was limited.  I discussed this with the patient.  She is unable to walk because of pain that begins in her right hip.  She also has pain in her toes.  I think the best neck step is to admit her for IV heparin with plans for angiography tomorrow.  I told her I would begin with a diagnostic study from her left groin.  Based on these results intervention, if needed would be performed, potentially requiring right groin access.  She wishes to proceed.  She will be n.p.o. after midnight.  [name]

## 2023-02-04 NOTE — Telephone Encounter (Signed)
Pt's daughter states that pt's right leg looks as if her veins are about to "pop" out as well as it being numb and 3 of her toes are turning black. She would ike a callback from nurses as soon  as possible regarding this matter. Please advise

## 2023-02-04 NOTE — Telephone Encounter (Signed)
Spoke to patient's daughter Sharyn Lull and patient.Stated she has been having pain in right leg and foot since last Sunday.She noticed this morning 3 middle toes on right foot are black.Stated she can hardly walk due to pain and numbness.Advised Dr.Kelly and Dr.Berry are out of office today.No appointments available with extender.Appointment scheduled with Diona Browner PA 4/4 at 2:45 PM.She will go to ED to be evaluated.

## 2023-02-04 NOTE — Assessment & Plan Note (Addendum)
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 However seems to be asymptomatic.  Pt with no abd pain, eating food at time of my evaluation in ED, etc. Management per vascular surgery who is already seeing pt for the RLE ischemia. Already on heparin, ASA, plavix as noted above.

## 2023-02-04 NOTE — Assessment & Plan Note (Signed)
Cont coreg, aldactone, and irbesartan.

## 2023-02-04 NOTE — Progress Notes (Addendum)
ANTICOAGULATION CONSULT NOTE - Initial Consult  Pharmacy Consult for heparin  Indication:  suspected leg ischemia , leg discoloration, VS consult pending    Allergies  Allergen Reactions   Codeine Itching    Patient Measurements: Height: 5\' 9"  (175.3 cm) Weight: 101.6 kg (224 lb) IBW/kg (Calculated) : 66.2 Heparin Dosing Weight: 88.4kg   Vital Signs: Temp: 98.4 F (36.9 C) (03/29 1804) Temp Source: Oral (03/29 1804) BP: 106/71 (03/29 1332) Pulse Rate: 81 (03/29 1332)  Labs: Recent Labs    02/04/23 1620 02/04/23 1652  HGB  --  14.6  HCT  --  43.4  PLT  --  164  APTT 32  --   CREATININE  --  1.14*    Estimated Creatinine Clearance: 65.8 mL/min (A) (by C-G formula based on SCr of 1.14 mg/dL (H)).   Medical History: Past Medical History:  Diagnosis Date   CAD (coronary artery disease)    07/10/17 PCI/DES to LAD, EF 35-40%   Diabetes mellitus without complication (HCC)    Hyperlipidemia    Hypertension    NSTEMI (non-ST elevated myocardial infarction) [facility])     Assessment: Patient admitted with CC of toe discoloration and numbness in extremity. CT angiogram showing stent occlusion, will need VS intervention. CBC stable. Patient not on anticoagulation PTA.     Consult initially d/c'ed by Dr. Zenia Resides but then re-placed @ 22:00. Confirmed to start heparin.   Goal of Therapy:  Heparin level 0.3-0.7 units/ml Monitor platelets by anticoagulation protocol: Yes   Plan:  Give 4000 units bolus x 1 Start heparin infusion at 1400 units/hr Check anti-Xa level in 6 hours and daily while on heparin Continue to monitor H&H and platelets  Esmeralda Arthur, PharmD, BCCCP  02/04/2023,7:05 PM

## 2023-02-04 NOTE — ED Triage Notes (Signed)
Pt came in via POV d/t Rt hip pain, numbness on that side, toes discoloration & cramps in the Rt foot. Decreased ability to bear weight on that foot d/t 10/10 pain, A/OX4 while in triage.

## 2023-02-04 NOTE — Assessment & Plan Note (Signed)
EF 50-55% Cont ASA, coreg, plavix, ARB, aldactone

## 2023-02-04 NOTE — H&P (View-Only) (Signed)
[facility]  Patient name: Alice Bennett MRN: [id number] DOB: [birth year] Sex: female   REQUESTING PROVIDER:    Dr. Zenia Resides   REASON FOR CONSULT:    Cold right foot  HISTORY OF PRESENT ILLNESS:   Alice Bennett is a 62 y.o. female, who presented to the emergency department with complaints of coolness and discoloration in her right toes that has been present since Sunday.  She has difficulty walking secondary to pain.  She states that her foot is the same way it was before she had a common iliac stent placed by Dr. Gwenlyn Found in September 2023.  The patient has a history of coronary artery disease, status post PCI in 2014 and 2018.  She last saw Dr. Claiborne Billings in December 2023.  She is on dual antiplatelet therapy with aspirin and Plavix.  She takes a statin for hypercholesterolemia.  She is medically managed for hypertension with an ARB.  She is a diabetic.  PAST MEDICAL HISTORY    Past Medical History:  Diagnosis Date   CAD (coronary artery disease)    07/10/17 PCI/DES to LAD, EF 35-40%   Diabetes mellitus without complication ([place])    Hyperlipidemia    Hypertension    NSTEMI (non-ST elevated myocardial infarction) ([place])      FAMILY HISTORY   History reviewed. No pertinent family history.  SOCIAL HISTORY:   Social History   Socioeconomic History   Marital status: Single    Spouse name: Not on file   Number of children: Not on file   Years of education: Not on file   Highest education level: Not on file  Occupational History   Not on file  Tobacco Use   Smoking status: Every Day    Packs/day: 0.50    Years: 10.00    Additional pack years: 0.00    Total pack years: 5.00    Types: Cigarettes   Smokeless tobacco: Never  Vaping Use   Vaping Use: Never used  Substance and Sexual Activity   Alcohol use: No   Drug use: No   Sexual activity: Not on file  Other Topics Concern   Not on file  Social History  Narrative   Not on file   Social Determinants of Health   Financial Resource Strain: Not on file  Food Insecurity: No Food Insecurity (07/27/2022)   Hunger Vital Sign    Worried About Running Out of Food in the Last Year: Never true    Ran Out of Food in the Last Year: Never true  Transportation Needs: No Transportation Needs (07/27/2022)   PRAPARE - Hydrologist (Medical): No    Lack of Transportation (Non-Medical): No  Physical Activity: Not on file  Stress: Not on file  Social Connections: Not on file  Intimate Partner Violence: Not At Risk (07/27/2022)   Humiliation, Afraid, Rape, and Kick questionnaire    Fear of Current or Ex-Partner: No    Emotionally Abused: No    Physically Abused: No    Sexually Abused: No    ALLERGIES:    Allergies  Allergen Reactions   Codeine Itching    CURRENT MEDICATIONS:    Current Facility-Administered Medications  Medication Dose Route Frequency Provider Last Rate Last Admin   lactated ringers infusion   Intravenous Continuous [name], MD 125 mL/hr at 02/04/23 1622 New Bag at 02/04/23 1622   Current Outpatient Medications  Medication Sig Dispense Refill   aspirin EC 81  MG tablet Take 81 mg by mouth daily.     atorvastatin (LIPITOR) 80 MG tablet Take 1 tablet (80 mg total) by mouth daily at 6 PM. 30 tablet 0   carvedilol (COREG) 6.25 MG tablet Take 1 tablet (6.25 mg total) by mouth 2 (two) times daily with a meal. 60 tablet 0   Cholecalciferol (D3 HIGH POTENCY) 125 MCG (5000 UT) capsule Take 5,000 Units by mouth daily.     clonazePAM (KLONOPIN) 1 MG tablet Take 1 mg by mouth 2 (two) times daily.     clopidogrel (PLAVIX) 75 MG tablet Take 1 tablet (75 mg total) by mouth daily. 30 tablet 0   ezetimibe (ZETIA) 10 MG tablet Take 1 tablet (10 mg total) by mouth daily. 90 tablet 3   FARXIGA 10 MG TABS tablet Take 10 mg by mouth daily.      MOUNJARO 5 MG/0.5ML Pen Inject into the skin.     nitroGLYCERIN  (NITROSTAT) 0.4 MG SL tablet Place 0.4 mg under the tongue as needed for chest pain.     olmesartan (BENICAR) 20 MG tablet Take 1 tablet (20 mg total) by mouth daily. 90 tablet 3   pantoprazole (PROTONIX) 40 MG tablet Take 1 tablet (40 mg total) by mouth daily. 30 tablet 0   pregabalin (LYRICA) 150 MG capsule Take 150 mg by mouth 2 (two) times daily.     sertraline (ZOLOFT) 100 MG tablet Take 100 mg by mouth daily.     spironolactone (ALDACTONE) 25 MG tablet Take 1 tablet (25 mg total) by mouth daily. 90 tablet 3   VASCEPA 1 g capsule Take 1 capsule (1 g total) by mouth 2 (two) times daily. 180 capsule 3    REVIEW OF SYSTEMS:   [X]  denotes positive finding, [ ]  denotes negative finding Cardiac  Comments:  Chest pain or chest pressure:    Shortness of breath upon exertion:    Short of breath when lying flat:    Irregular heart rhythm:        Vascular    Pain in calf, thigh, or hip brought on by ambulation: x   Pain in feet at night that wakes you up from your sleep:     Blood clot in your veins:    Leg swelling:         Pulmonary    Oxygen at home:    Productive cough:     Wheezing:         Neurologic    Sudden weakness in arms or legs:     Sudden numbness in arms or legs:     Sudden onset of difficulty speaking or slurred speech:    Temporary loss of vision in one eye:     Problems with dizziness:         Gastrointestinal    Blood in stool:      Vomited blood:         Genitourinary    Burning when urinating:     Blood in urine:        Psychiatric    Major depression:         Hematologic    Bleeding problems:    Problems with blood clotting too easily:        Skin    Rashes or ulcers:        Constitutional    Fever or chills:     PHYSICAL EXAM:   Vitals:   02/04/23 1332 02/04/23 1336 02/04/23 1804  BP: 106/71    Pulse: 81    Resp: 16    Temp: 98.3 F (36.8 C)  98.4 F (36.9 C)  TempSrc: Oral  Oral  SpO2: 96%    Weight:  101.6 kg   Height:  5\' 9"   (1.753 m)     GENERAL: The patient is a well-nourished female, in no acute distress. The vital signs are documented above. CARDIAC: There is a regular rate and rhythm.  VASCULAR: I was unable to palpate a right femoral pulse.  She has a monophasic Doppler signal in the right posterior tibial artery and a triphasic signal on the left posterior tibial artery. PULMONARY: Nonlabored respirations ABDOMEN: Soft and non-tender with normal pitched bowel sounds.  MUSCULOSKELETAL: There are no major deformities or cyanosis. NEUROLOGIC: Motor function is present in the toes on the right but somewhat sluggish.  Her sensation is also moderately diminished. SKIN: Slight bluish discoloration of her right toes.  No ulcers.Marland Kitchen PSYCHIATRIC: The patient has a normal affect.  STUDIES:   CT scan is pending  ASSESSMENT and PLAN   PAD: The patient has approximately 5 days worth of symptoms in her right foot consistent with pain and discoloration.  On exam she does not have a palpable femoral pulse and she has monophasic posterior tibial signal on the right with triphasic on the left.  I suspect that she has occluded her right common iliac stent or she has had progression of her outflow disease.  I discussed that once we get her CT scan, we will better know her anatomy.  If her stent is occluded, she would need to be placed on heparin and admitted to the hospital for intervention.  Further plans will be based upon the results of her CT angiogram.   Leia Alf, MD, FACS [facility] [phone] Pager [phone]

## 2023-02-04 NOTE — H&P (Signed)
History and Physical    Patient: Alice Bennett [id number] DOB: 12/23/1960 DOA: 02/04/2023 DOS: the patient was seen and examined on 02/04/2023 PCP: Pcp, No  Patient coming from: Home  Chief Complaint:  Chief Complaint  Patient presents with   Rt Hip Pain   Leg Pain   HPI: Alice Bennett is a 62 y.o. female with medical history significant of CAD, ICM with EF 50-55% as of 2d echo in July last year, DM2, HTN, HLD, PAD s/p R iliac stenting.  Pt in to ED with c/o 5 day h/o R foot pain, discoloration.  Difficulty walking due to pain.  Stent to R iliac by Dr. Gwenlyn Found in Sept 2023.  Pt denies N/V abd pain, anorexia (pt is eating dinner at time of my exam).    Review of Systems: As mentioned in the history of present illness. All other systems reviewed and are negative. Past Medical History:  Diagnosis Date   CAD (coronary artery disease)    07/10/17 PCI/DES to LAD, EF 35-40%   Diabetes mellitus without complication (HCC)    Hyperlipidemia    Hypertension    NSTEMI (non-ST elevated myocardial infarction) [facility])    Past Surgical History:  Procedure Laterality Date   ABDOMINAL AORTOGRAM W/LOWER EXTREMITY N/A 07/26/2022   Procedure: ABDOMINAL AORTOGRAM W/LOWER EXTREMITY;  Surgeon: Lorretta Harp, MD;  Location: [place] [facility];  Service: Cardiovascular;  Laterality: N/A;   CORONARY STENT INTERVENTION N/A 07/10/2017   Procedure: CORONARY STENT INTERVENTION;  Surgeon: Martinique, Peter M, MD;  Location: [place] [facility];  Service: Cardiovascular;  Laterality: N/A;   CORONARY STENT INTERVENTION N/A 05/31/2022   Procedure: CORONARY STENT INTERVENTION;  Surgeon: Nelva Bush, MD;  Location: [place] [facility];  Service: Cardiovascular;  Laterality: N/A;   INTRAVASCULAR PRESSURE WIRE/FFR STUDY N/A 07/10/2017   Procedure: INTRAVASCULAR PRESSURE WIRE/FFR STUDY;  Surgeon: Martinique, Peter M, MD;  Location: [place] [facility];  Service: Cardiovascular;  Laterality: N/A;   INTRAVASCULAR PRESSURE  WIRE/FFR STUDY N/A 05/31/2022   Procedure: INTRAVASCULAR PRESSURE WIRE/FFR STUDY;  Surgeon: Nelva Bush, MD;  Location: [place] [facility];  Service: Cardiovascular;  Laterality: N/A;   LEFT HEART CATH AND CORONARY ANGIOGRAPHY N/A 07/10/2017   Procedure: LEFT HEART CATH AND CORONARY ANGIOGRAPHY;  Surgeon: Martinique, Peter M, MD;  Location: [place] [facility];  Service: Cardiovascular;  Laterality: N/A;   PERIPHERAL VASCULAR INTERVENTION  07/26/2022   Procedure: PERIPHERAL VASCULAR INTERVENTION;  Surgeon: Lorretta Harp, MD;  Location: [place] [facility];  Service: Cardiovascular;;   RIGHT/LEFT HEART CATH AND CORONARY ANGIOGRAPHY N/A 05/31/2022   Procedure: RIGHT/LEFT HEART CATH AND CORONARY ANGIOGRAPHY;  Surgeon: Nelva Bush, MD;  Location: [place] [facility];  Service: Cardiovascular;  Laterality: N/A;   Social History:  reports that she has been smoking cigarettes. She has a 5.00 pack-year smoking history. She has never used smokeless tobacco. She reports that she does not drink alcohol and does not use drugs.  Allergies  Allergen Reactions   Codeine Itching    History reviewed. No pertinent family history.  Prior to Admission medications   Medication Sig Start Date End Date Taking? Authorizing Provider  aspirin EC 81 MG tablet Take 81 mg by mouth daily.   Yes [provider]  atorvastatin (LIPITOR) 80 MG tablet Take 1 tablet (80 mg total) by mouth daily at 6 PM. 07/19/17  Yes [place] Sine, MD  carvedilol (COREG) 6.25 MG tablet Take 1 tablet (6.25 mg total) by  mouth 2 (two) times daily with a meal. 07/19/17  Yes [place] Sine, MD  Cholecalciferol (D3 HIGH POTENCY) 125 MCG (5000 UT) capsule Take 5,000 Units by mouth daily.   Yes [provider]  clonazePAM (KLONOPIN) 1 MG tablet Take 1 mg by mouth 2 (two) times daily.   Yes [provider]  clopidogrel (PLAVIX) 75 MG tablet Take 1 tablet (75 mg total) by mouth daily. 07/19/17  Yes [place] Sine, MD   ezetimibe (ZETIA) 10 MG tablet Take 1 tablet (10 mg total) by mouth daily. 06/22/22  Yes [place] Sine, MD  FARXIGA 10 MG TABS tablet Take 10 mg by mouth daily.  03/28/19  Yes [provider]  olmesartan (BENICAR) 20 MG tablet Take 1 tablet (20 mg total) by mouth daily. 03/09/21  Yes [place] Sine, MD  OZEMPIC, 2 MG/DOSE, 8 MG/3ML SOPN Inject 2 mg into the skin once a week. 02/01/23  Yes [provider]  pantoprazole (PROTONIX) 40 MG tablet Take 1 tablet (40 mg total) by mouth daily. 07/19/17  Yes [place] Sine, MD  pregabalin (LYRICA) 150 MG capsule Take 150 mg by mouth 2 (two) times daily.   Yes [provider]  sertraline (ZOLOFT) 100 MG tablet Take 100 mg by mouth daily.   Yes [provider]  spironolactone (ALDACTONE) 25 MG tablet Take 1 tablet (25 mg total) by mouth daily. 06/09/22 02/04/23 Yes Caron Presume K, PA-C  VASCEPA 1 g capsule Take 1 capsule (1 g total) by mouth 2 (two) times daily. 10/21/22  Yes [place] Sine, MD  nitroGLYCERIN (NITROSTAT) 0.4 MG SL tablet Place 0.4 mg under the tongue as needed for chest pain. 04/25/20   [provider]    Physical Exam: Vitals:   02/04/23 1332 02/04/23 1336 02/04/23 1804 02/04/23 2138  BP: 106/71   (!) 141/44  Pulse: 81   68  Resp: 16   18  Temp: 98.3 F (36.8 C)  98.4 F (36.9 C) 98.3 F (36.8 C)  TempSrc: Oral  Oral Oral  SpO2: 96%   99%  Weight:  101.6 kg    Height:  5\' 9"  (1.753 m)     Constitutional: NAD, calm, comfortable Respiratory: clear to auscultation bilaterally, no wheezing, no crackles. Normal respiratory effort. No accessory muscle use.  Cardiovascular: Regular rate and rhythm Abdomen: no tenderness, no masses palpated. No hepatosplenomegaly. Bowel sounds positive.  Skin: Dusky appearance of R toes Neurologic: CN 2-12 grossly intact. Sensation intact, DTR normal. Strength 5/5 in all 4.  Psychiatric: Normal judgment and insight. Alert and oriented x 3. Normal mood.    Data Reviewed:    CTA LE bilateral: IMPRESSION: 1. Limited evaluation due to suboptimal opacification of the vessels. 2. Nonocclusive clot in the proximal 17 mm segment of the SMA with partial luminal narrowing. The SMA remains patent distal to this segment. 3. Artifact versus possible partial occlusion of the distal right common iliac artery stent. The right internal and external iliac arteries appear patent for the degree of opacification. 4. Patent appearance of the posterior tibial and fibular arteries bilaterally. The anterior tibial arteries are suboptimally evaluated. 5. No bowel obstruction. Normal appendix. 6. Small nonobstructing bilateral renal calculi. No hydronephrosis.   Assessment and Plan: * Critical limb ischemia of right lower extremity (HCC) Thrombus with occlusion / partial occlusion of R iliac stent based on CTA. Heparin gtt Looks like Dr. 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 However seems to be asymptomatic.  Pt with no abd pain, eating food at time of my evaluation in ED, etc. Management per vascular surgery who is already seeing pt for the RLE ischemia. Already on heparin, ASA, plavix as noted above.  Hypertension Cont coreg, aldactone, and irbesartan.  Hyperlipidemia Cont statin, zetia  Ischemic cardiomyopathy EF 50-55% Cont ASA, coreg, plavix, ARB, aldactone      Advance Care Planning:   Code Status: Full Code  Consults: Dr. Trula Slade  Family Communication: No family in room  Severity of Illness: The appropriate patient status for this patient is OBSERVATION. Observation status is judged to be reasonable and necessary in order to provide the required intensity of service to ensure the patient's safety. The patient's presenting symptoms, physical exam findings, and initial radiographic and laboratory data  in the context of their medical condition is felt to place them at decreased risk for further clinical deterioration. Furthermore, it is anticipated that the patient will be medically stable for discharge from the hospital within 2 midnights of admission.   Author: Etta Quill., DO 02/04/2023 11:55 PM  For on call review [email or web address].

## 2023-02-04 NOTE — Consult Note (Signed)
[facility]  Patient name: Alice Bennett MRN: [id number] DOB: [birth year] Sex: female   REQUESTING PROVIDER:    Dr. Zenia Resides   REASON FOR CONSULT:    Cold right foot  HISTORY OF PRESENT ILLNESS:   Alice Bennett is a 62 y.o. female, who presented to the emergency department with complaints of coolness and discoloration in her right toes that has been present since Sunday.  She has difficulty walking secondary to pain.  She states that her foot is the same way it was before she had a common iliac stent placed by Dr. Gwenlyn Found in September 2023.  The patient has a history of coronary artery disease, status post PCI in 2014 and 2018.  She last saw Dr. Claiborne Billings in December 2023.  She is on dual antiplatelet therapy with aspirin and Plavix.  She takes a statin for hypercholesterolemia.  She is medically managed for hypertension with an ARB.  She is a diabetic.  PAST MEDICAL HISTORY    Past Medical History:  Diagnosis Date   CAD (coronary artery disease)    07/10/17 PCI/DES to LAD, EF 35-40%   Diabetes mellitus without complication ([place])    Hyperlipidemia    Hypertension    NSTEMI (non-ST elevated myocardial infarction) ([place])      FAMILY HISTORY   History reviewed. No pertinent family history.  SOCIAL HISTORY:   Social History   Socioeconomic History   Marital status: Single    Spouse name: Not on file   Number of children: Not on file   Years of education: Not on file   Highest education level: Not on file  Occupational History   Not on file  Tobacco Use   Smoking status: Every Day    Packs/day: 0.50    Years: 10.00    Additional pack years: 0.00    Total pack years: 5.00    Types: Cigarettes   Smokeless tobacco: Never  Vaping Use   Vaping Use: Never used  Substance and Sexual Activity   Alcohol use: No   Drug use: No   Sexual activity: Not on file  Other Topics Concern   Not on file  Social History  Narrative   Not on file   Social Determinants of Health   Financial Resource Strain: Not on file  Food Insecurity: No Food Insecurity (07/27/2022)   Hunger Vital Sign    Worried About Running Out of Food in the Last Year: Never true    Ran Out of Food in the Last Year: Never true  Transportation Needs: No Transportation Needs (07/27/2022)   PRAPARE - Hydrologist (Medical): No    Lack of Transportation (Non-Medical): No  Physical Activity: Not on file  Stress: Not on file  Social Connections: Not on file  Intimate Partner Violence: Not At Risk (07/27/2022)   Humiliation, Afraid, Rape, and Kick questionnaire    Fear of Current or Ex-Partner: No    Emotionally Abused: No    Physically Abused: No    Sexually Abused: No    ALLERGIES:    Allergies  Allergen Reactions   Codeine Itching    CURRENT MEDICATIONS:    Current Facility-Administered Medications  Medication Dose Route Frequency Provider Last Rate Last Admin   lactated ringers infusion   Intravenous Continuous [name], MD 125 mL/hr at 02/04/23 1622 New Bag at 02/04/23 1622   Current Outpatient Medications  Medication Sig Dispense Refill   aspirin EC 81  MG tablet Take 81 mg by mouth daily.     atorvastatin (LIPITOR) 80 MG tablet Take 1 tablet (80 mg total) by mouth daily at 6 PM. 30 tablet 0   carvedilol (COREG) 6.25 MG tablet Take 1 tablet (6.25 mg total) by mouth 2 (two) times daily with a meal. 60 tablet 0   Cholecalciferol (D3 HIGH POTENCY) 125 MCG (5000 UT) capsule Take 5,000 Units by mouth daily.     clonazePAM (KLONOPIN) 1 MG tablet Take 1 mg by mouth 2 (two) times daily.     clopidogrel (PLAVIX) 75 MG tablet Take 1 tablet (75 mg total) by mouth daily. 30 tablet 0   ezetimibe (ZETIA) 10 MG tablet Take 1 tablet (10 mg total) by mouth daily. 90 tablet 3   FARXIGA 10 MG TABS tablet Take 10 mg by mouth daily.      MOUNJARO 5 MG/0.5ML Pen Inject into the skin.     nitroGLYCERIN  (NITROSTAT) 0.4 MG SL tablet Place 0.4 mg under the tongue as needed for chest pain.     olmesartan (BENICAR) 20 MG tablet Take 1 tablet (20 mg total) by mouth daily. 90 tablet 3   pantoprazole (PROTONIX) 40 MG tablet Take 1 tablet (40 mg total) by mouth daily. 30 tablet 0   pregabalin (LYRICA) 150 MG capsule Take 150 mg by mouth 2 (two) times daily.     sertraline (ZOLOFT) 100 MG tablet Take 100 mg by mouth daily.     spironolactone (ALDACTONE) 25 MG tablet Take 1 tablet (25 mg total) by mouth daily. 90 tablet 3   VASCEPA 1 g capsule Take 1 capsule (1 g total) by mouth 2 (two) times daily. 180 capsule 3    REVIEW OF SYSTEMS:   [X]  denotes positive finding, [ ]  denotes negative finding Cardiac  Comments:  Chest pain or chest pressure:    Shortness of breath upon exertion:    Short of breath when lying flat:    Irregular heart rhythm:        Vascular    Pain in calf, thigh, or hip brought on by ambulation: x   Pain in feet at night that wakes you up from your sleep:     Blood clot in your veins:    Leg swelling:         Pulmonary    Oxygen at home:    Productive cough:     Wheezing:         Neurologic    Sudden weakness in arms or legs:     Sudden numbness in arms or legs:     Sudden onset of difficulty speaking or slurred speech:    Temporary loss of vision in one eye:     Problems with dizziness:         Gastrointestinal    Blood in stool:      Vomited blood:         Genitourinary    Burning when urinating:     Blood in urine:        Psychiatric    Major depression:         Hematologic    Bleeding problems:    Problems with blood clotting too easily:        Skin    Rashes or ulcers:        Constitutional    Fever or chills:     PHYSICAL EXAM:   Vitals:   02/04/23 1332 02/04/23 1336 02/04/23 1804  BP: 106/71    Pulse: 81    Resp: 16    Temp: 98.3 F (36.8 C)  98.4 F (36.9 C)  TempSrc: Oral  Oral  SpO2: 96%    Weight:  101.6 kg   Height:  5\' 9"   (1.753 m)     GENERAL: The patient is a well-nourished female, in no acute distress. The vital signs are documented above. CARDIAC: There is a regular rate and rhythm.  VASCULAR: I was unable to palpate a right femoral pulse.  She has a monophasic Doppler signal in the right posterior tibial artery and a triphasic signal on the left posterior tibial artery. PULMONARY: Nonlabored respirations ABDOMEN: Soft and non-tender with normal pitched bowel sounds.  MUSCULOSKELETAL: There are no major deformities or cyanosis. NEUROLOGIC: Motor function is present in the toes on the right but somewhat sluggish.  Her sensation is also moderately diminished. SKIN: Slight bluish discoloration of her right toes.  No ulcers.Marland Kitchen PSYCHIATRIC: The patient has a normal affect.  STUDIES:   CT scan is pending  ASSESSMENT and PLAN   PAD: The patient has approximately 5 days worth of symptoms in her right foot consistent with pain and discoloration.  On exam she does not have a palpable femoral pulse and she has monophasic posterior tibial signal on the right with triphasic on the left.  I suspect that she has occluded her right common iliac stent or she has had progression of her outflow disease.  I discussed that once we get her CT scan, we will better know her anatomy.  If her stent is occluded, she would need to be placed on heparin and admitted to the hospital for intervention.  Further plans will be based upon the results of her CT angiogram.   Leia Alf, MD, FACS [facility] [phone] Pager [phone]

## 2023-02-05 ENCOUNTER — Other Ambulatory Visit: Payer: Self-pay

## 2023-02-05 ENCOUNTER — Observation Stay (HOSPITAL_BASED_OUTPATIENT_CLINIC_OR_DEPARTMENT_OTHER): Payer: Medicaid Other | Admitting: Certified Registered Nurse Anesthetist

## 2023-02-05 ENCOUNTER — Encounter (HOSPITAL_COMMUNITY)
Admission: EM | Disposition: A | Discharge status: Home / Self Care | Payer: Self-pay | Source: Home / Self Care | Attending: Internal Medicine

## 2023-02-05 ENCOUNTER — Encounter (HOSPITAL_COMMUNITY): Payer: Self-pay | Admitting: Internal Medicine

## 2023-02-05 ENCOUNTER — Observation Stay (HOSPITAL_COMMUNITY): Payer: Medicaid Other

## 2023-02-05 ENCOUNTER — Observation Stay (HOSPITAL_COMMUNITY): Payer: Medicaid Other | Admitting: Certified Registered Nurse Anesthetist

## 2023-02-05 DIAGNOSIS — I745 Embolism and thrombosis of iliac artery: Secondary | ICD-10-CM | POA: Diagnosis present

## 2023-02-05 DIAGNOSIS — Z7985 Long-term (current) use of injectable non-insulin antidiabetic drugs: Secondary | ICD-10-CM | POA: Diagnosis not present

## 2023-02-05 DIAGNOSIS — I10 Essential (primary) hypertension: Secondary | ICD-10-CM

## 2023-02-05 DIAGNOSIS — E1151 Type 2 diabetes mellitus with diabetic peripheral angiopathy without gangrene: Secondary | ICD-10-CM | POA: Diagnosis present

## 2023-02-05 DIAGNOSIS — K219 Gastro-esophageal reflux disease without esophagitis: Secondary | ICD-10-CM | POA: Diagnosis present

## 2023-02-05 DIAGNOSIS — I2510 Atherosclerotic heart disease of native coronary artery without angina pectoris: Secondary | ICD-10-CM

## 2023-02-05 DIAGNOSIS — Z7982 Long term (current) use of aspirin: Secondary | ICD-10-CM | POA: Diagnosis not present

## 2023-02-05 DIAGNOSIS — I70221 Atherosclerosis of native arteries of extremities with rest pain, right leg: Secondary | ICD-10-CM

## 2023-02-05 DIAGNOSIS — E7800 Pure hypercholesterolemia, unspecified: Secondary | ICD-10-CM | POA: Diagnosis present

## 2023-02-05 DIAGNOSIS — Y831 Surgical operation with implant of artificial internal device as the cause of abnormal reaction of the patient, or of later complication, without mention of misadventure at the time of the procedure: Secondary | ICD-10-CM | POA: Diagnosis present

## 2023-02-05 DIAGNOSIS — F17210 Nicotine dependence, cigarettes, uncomplicated: Secondary | ICD-10-CM | POA: Diagnosis present

## 2023-02-05 DIAGNOSIS — Z992 Dependence on renal dialysis: Secondary | ICD-10-CM | POA: Diagnosis not present

## 2023-02-05 DIAGNOSIS — I255 Ischemic cardiomyopathy: Secondary | ICD-10-CM | POA: Diagnosis present

## 2023-02-05 DIAGNOSIS — T82868A Thrombosis of vascular prosthetic devices, implants and grafts, initial encounter: Secondary | ICD-10-CM | POA: Diagnosis present

## 2023-02-05 DIAGNOSIS — K551 Chronic vascular disorders of intestine: Secondary | ICD-10-CM | POA: Diagnosis present

## 2023-02-05 DIAGNOSIS — I252 Old myocardial infarction: Secondary | ICD-10-CM

## 2023-02-05 DIAGNOSIS — Z885 Allergy status to narcotic agent status: Secondary | ICD-10-CM | POA: Diagnosis not present

## 2023-02-05 DIAGNOSIS — E876 Hypokalemia: Secondary | ICD-10-CM | POA: Diagnosis present

## 2023-02-05 DIAGNOSIS — Z7902 Long term (current) use of antithrombotics/antiplatelets: Secondary | ICD-10-CM | POA: Diagnosis not present

## 2023-02-05 DIAGNOSIS — M25551 Pain in right hip: Secondary | ICD-10-CM | POA: Diagnosis present

## 2023-02-05 DIAGNOSIS — R262 Difficulty in walking, not elsewhere classified: Secondary | ICD-10-CM | POA: Diagnosis present

## 2023-02-05 DIAGNOSIS — M79604 Pain in right leg: Secondary | ICD-10-CM | POA: Diagnosis present

## 2023-02-05 DIAGNOSIS — J449 Chronic obstructive pulmonary disease, unspecified: Secondary | ICD-10-CM

## 2023-02-05 DIAGNOSIS — Z79899 Other long term (current) drug therapy: Secondary | ICD-10-CM | POA: Diagnosis not present

## 2023-02-05 HISTORY — PX: ULTRASOUND GUIDANCE FOR VASCULAR ACCESS: SHX6516

## 2023-02-05 HISTORY — PX: LOWER EXTREMITY ANGIOGRAM: SHX5508

## 2023-02-05 HISTORY — PX: INSERTION OF ILIAC STENT: SHX6256

## 2023-02-05 HISTORY — PX: AORTOGRAM: SHX6300

## 2023-02-05 LAB — CBC
HCT: 37.8 % (ref 36.0–46.0)
Hemoglobin: 12.2 g/dL (ref 12.0–15.0)
MCH: 28.7 pg (ref 26.0–34.0)
MCHC: 32.3 g/dL (ref 30.0–36.0)
MCV: 88.9 fL (ref 80.0–100.0)
Platelets: 149 10*3/uL — ABNORMAL LOW (ref 150–400)
RBC: 4.25 MIL/uL (ref 3.87–5.11)
RDW: 14.1 % (ref 11.5–15.5)
WBC: 5.4 10*3/uL (ref 4.0–10.5)
nRBC: 0 % (ref 0.0–0.2)

## 2023-02-05 LAB — GLUCOSE, CAPILLARY
Glucose-Capillary: 117 mg/dL — ABNORMAL HIGH (ref 70–99)
Glucose-Capillary: 165 mg/dL — ABNORMAL HIGH (ref 70–99)
Glucose-Capillary: 175 mg/dL — ABNORMAL HIGH (ref 70–99)
Glucose-Capillary: 203 mg/dL — ABNORMAL HIGH (ref 70–99)
Glucose-Capillary: 210 mg/dL — ABNORMAL HIGH (ref 70–99)

## 2023-02-05 LAB — HEPARIN LEVEL (UNFRACTIONATED)
Heparin Unfractionated: >1.1 IU/mL — ABNORMAL HIGH (ref 0.30–0.70)
Heparin Unfractionated: 0.22 IU/mL — ABNORMAL LOW (ref 0.30–0.70)
Heparin Unfractionated: <0.1 IU/mL — ABNORMAL LOW (ref 0.30–0.70)

## 2023-02-05 LAB — SURGICAL PCR SCREEN
MRSA, PCR: NEGATIVE
Staphylococcus aureus: NEGATIVE

## 2023-02-05 LAB — CBG MONITORING, ED
Glucose-Capillary: 139 mg/dL — ABNORMAL HIGH (ref 70–99)
Glucose-Capillary: 193 mg/dL — ABNORMAL HIGH (ref 70–99)

## 2023-02-05 LAB — BASIC METABOLIC PANEL
Anion gap: 7 (ref 5–15)
BUN: 12 mg/dL (ref 8–23)
CO2: 26 mmol/L (ref 22–32)
Calcium: 8.2 mg/dL — ABNORMAL LOW (ref 8.9–10.3)
Chloride: 104 mmol/L (ref 98–111)
Creatinine, Ser: 1.06 mg/dL — ABNORMAL HIGH (ref 0.44–1.00)
GFR, Estimated: 60 mL/min — ABNORMAL LOW (ref >60)
Glucose, Bld: 194 mg/dL — ABNORMAL HIGH (ref 70–99)
Potassium: 3.4 mmol/L — ABNORMAL LOW (ref 3.5–5.1)
Sodium: 137 mmol/L (ref 135–145)

## 2023-02-05 LAB — HIV ANTIBODY (ROUTINE TESTING W REFLEX): HIV Screen 4th Generation wRfx: NONREACTIVE

## 2023-02-05 SURGERY — AORTOGRAM
Anesthesia: General | Site: Groin | Laterality: Right

## 2023-02-05 MED ORDER — LIDOCAINE 2% (20 MG/ML) 5 ML SYRINGE
INTRAMUSCULAR | Status: DC | PRN
  Administered 2023-02-05: 20 mg via INTRAVENOUS

## 2023-02-05 MED ORDER — FENTANYL CITRATE (PF) 250 MCG/5ML IJ SOLN
INTRAMUSCULAR | Status: DC | PRN
  Administered 2023-02-05: 100 ug via INTRAVENOUS
  Administered 2023-02-05 (×3): 50 ug via INTRAVENOUS

## 2023-02-05 MED ORDER — OXYCODONE HCL 5 MG PO TABS: 5.0000 mg | ORAL_TABLET | ORAL | Status: DC | PRN

## 2023-02-05 MED ORDER — ASPIRIN 81 MG PO TBEC: 81.0000 mg | DELAYED_RELEASE_TABLET | Freq: Every day | ORAL | Status: DC

## 2023-02-05 MED ORDER — HEPARIN SODIUM (PORCINE) 1000 UNIT/ML IJ SOLN
INTRAMUSCULAR | Status: DC | PRN
  Administered 2023-02-05: 10000 [IU] via INTRAVENOUS

## 2023-02-05 MED ORDER — LACTATED RINGERS IV SOLN: INTRAVENOUS | Status: DC

## 2023-02-05 MED ORDER — 0.9 % SODIUM CHLORIDE (POUR BTL) OPTIME
TOPICAL | Status: DC | PRN
  Administered 2023-02-05: 1000 mL

## 2023-02-05 MED ORDER — MIDAZOLAM HCL 2 MG/2ML IJ SOLN
INTRAMUSCULAR | Status: AC
  Filled 2023-02-05: qty 2

## 2023-02-05 MED ORDER — FENTANYL CITRATE (PF) 250 MCG/5ML IJ SOLN
INTRAMUSCULAR | Status: AC
  Filled 2023-02-05: qty 5

## 2023-02-05 MED ORDER — ORAL CARE MOUTH RINSE: 15.0000 mL | Freq: Once | OROMUCOSAL | Status: AC

## 2023-02-05 MED ORDER — PROTAMINE SULFATE 10 MG/ML IV SOLN
INTRAVENOUS | Status: AC
  Filled 2023-02-05: qty 5

## 2023-02-05 MED ORDER — HEPARIN BOLUS VIA INFUSION
2000.0000 [IU] | Freq: Once | INTRAVENOUS | Status: AC
  Administered 2023-02-05: 2000 [IU] via INTRAVENOUS
  Filled 2023-02-05: qty 2000

## 2023-02-05 MED ORDER — PHENYLEPHRINE 80 MCG/ML (10ML) SYRINGE FOR IV PUSH (FOR BLOOD PRESSURE SUPPORT)
PREFILLED_SYRINGE | INTRAVENOUS | Status: DC | PRN
  Administered 2023-02-05: 80 ug via INTRAVENOUS

## 2023-02-05 MED ORDER — DEXAMETHASONE SODIUM PHOSPHATE 10 MG/ML IJ SOLN
INTRAMUSCULAR | Status: DC | PRN
  Administered 2023-02-05: 5 mg via INTRAVENOUS

## 2023-02-05 MED ORDER — SODIUM CHLORIDE 0.9% FLUSH: 3.0000 mL | INTRAVENOUS | Status: DC | PRN

## 2023-02-05 MED ORDER — PHENYLEPHRINE HCL-NACL 20-0.9 MG/250ML-% IV SOLN
INTRAVENOUS | Status: DC | PRN
  Administered 2023-02-05: 25 ug/min via INTRAVENOUS

## 2023-02-05 MED ORDER — MORPHINE SULFATE (PF) 2 MG/ML IV SOLN
2.0000 mg | INTRAVENOUS | Status: DC | PRN
  Administered 2023-02-05: 2 mg via INTRAVENOUS
  Filled 2023-02-05: qty 1

## 2023-02-05 MED ORDER — DEXAMETHASONE SODIUM PHOSPHATE 10 MG/ML IJ SOLN
INTRAMUSCULAR | Status: AC
  Filled 2023-02-05: qty 1

## 2023-02-05 MED ORDER — CHLORHEXIDINE GLUCONATE 0.12 % MT SOLN: 15.0000 mL | Freq: Once | OROMUCOSAL | Status: AC

## 2023-02-05 MED ORDER — HEPARIN 6000 UNIT IRRIGATION SOLUTION
Status: DC | PRN
  Administered 2023-02-05: 1

## 2023-02-05 MED ORDER — ONDANSETRON HCL 4 MG/2ML IJ SOLN: 4.0000 mg | Freq: Four times a day (QID) | INTRAMUSCULAR | Status: DC | PRN

## 2023-02-05 MED ORDER — ONDANSETRON HCL 4 MG/2ML IJ SOLN
INTRAMUSCULAR | Status: DC | PRN
  Administered 2023-02-05: 4 mg via INTRAVENOUS

## 2023-02-05 MED ORDER — CHLORHEXIDINE GLUCONATE 0.12 % MT SOLN
OROMUCOSAL | Status: AC
  Administered 2023-02-05: 15 mL via OROMUCOSAL
  Filled 2023-02-05: qty 15

## 2023-02-05 MED ORDER — ONDANSETRON HCL 4 MG/2ML IJ SOLN
INTRAMUSCULAR | Status: AC
  Filled 2023-02-05: qty 2

## 2023-02-05 MED ORDER — PROTAMINE SULFATE 10 MG/ML IV SOLN
INTRAVENOUS | Status: DC | PRN
  Administered 2023-02-05: 30 mg via INTRAVENOUS

## 2023-02-05 MED ORDER — HYDRALAZINE HCL 20 MG/ML IJ SOLN: 5.0000 mg | INTRAMUSCULAR | Status: DC | PRN

## 2023-02-05 MED ORDER — ACETAMINOPHEN 325 MG PO TABS: 650.0000 mg | ORAL_TABLET | ORAL | Status: DC | PRN

## 2023-02-05 MED ORDER — SUGAMMADEX SODIUM 200 MG/2ML IV SOLN
INTRAVENOUS | Status: DC | PRN
  Administered 2023-02-05: 200 mg via INTRAVENOUS

## 2023-02-05 MED ORDER — PROPOFOL 10 MG/ML IV BOLUS
INTRAVENOUS | Status: AC
  Filled 2023-02-05: qty 20

## 2023-02-05 MED ORDER — PHENYLEPHRINE 80 MCG/ML (10ML) SYRINGE FOR IV PUSH (FOR BLOOD PRESSURE SUPPORT)
PREFILLED_SYRINGE | INTRAVENOUS | Status: AC
  Filled 2023-02-05: qty 10

## 2023-02-05 MED ORDER — POTASSIUM CHLORIDE CRYS ER 20 MEQ PO TBCR
40.0000 meq | EXTENDED_RELEASE_TABLET | ORAL | Status: AC
  Administered 2023-02-05: 40 meq via ORAL
  Filled 2023-02-05: qty 2

## 2023-02-05 MED ORDER — LABETALOL HCL 5 MG/ML IV SOLN: 10.0000 mg | INTRAVENOUS | Status: DC | PRN

## 2023-02-05 MED ORDER — LIDOCAINE 2% (20 MG/ML) 5 ML SYRINGE
INTRAMUSCULAR | Status: AC
  Filled 2023-02-05: qty 5

## 2023-02-05 MED ORDER — SODIUM CHLORIDE 0.9% FLUSH
3.0000 mL | Freq: Two times a day (BID) | INTRAVENOUS | Status: DC
  Administered 2023-02-06: 3 mL via INTRAVENOUS

## 2023-02-05 MED ORDER — SODIUM CHLORIDE 0.9 % WEIGHT BASED INFUSION: 1.0000 mL/kg/h | INTRAVENOUS | Status: DC

## 2023-02-05 MED ORDER — PROPOFOL 10 MG/ML IV BOLUS
INTRAVENOUS | Status: DC | PRN
  Administered 2023-02-05: 100 mg via INTRAVENOUS

## 2023-02-05 MED ORDER — ROCURONIUM BROMIDE 10 MG/ML (PF) SYRINGE
PREFILLED_SYRINGE | INTRAVENOUS | Status: DC | PRN
  Administered 2023-02-05: 20 mg via INTRAVENOUS
  Administered 2023-02-05: 60 mg via INTRAVENOUS

## 2023-02-05 MED ORDER — ROCURONIUM BROMIDE 10 MG/ML (PF) SYRINGE
PREFILLED_SYRINGE | INTRAVENOUS | Status: AC
  Filled 2023-02-05: qty 10

## 2023-02-05 MED ORDER — SODIUM CHLORIDE 0.9 % IV SOLN: INTRAVENOUS | Status: DC

## 2023-02-05 MED ORDER — MIDAZOLAM HCL 5 MG/5ML IJ SOLN
INTRAMUSCULAR | Status: DC | PRN
  Administered 2023-02-05: 2 mg via INTRAVENOUS

## 2023-02-05 MED ORDER — IODIXANOL 320 MG/ML IV SOLN
INTRAVENOUS | Status: DC | PRN
  Administered 2023-02-05: 69 mL

## 2023-02-05 MED ORDER — HYDROMORPHONE HCL 1 MG/ML IJ SOLN: 0.5000 mg | INTRAMUSCULAR | Status: DC | PRN

## 2023-02-05 MED ORDER — SODIUM CHLORIDE 0.9 % IV SOLN: 250.0000 mL | INTRAVENOUS | Status: DC | PRN

## 2023-02-05 MED ORDER — CLOPIDOGREL BISULFATE 75 MG PO TABS: 75.0000 mg | ORAL_TABLET | Freq: Every day | ORAL | Status: DC

## 2023-02-05 MED ORDER — INSULIN ASPART 100 UNIT/ML IJ SOLN: 0.0000 [IU] | INTRAMUSCULAR | Status: DC | PRN

## 2023-02-05 SURGICAL SUPPLY — 70 items
ADH SKN CLS APL DERMABOND .7 (GAUZE/BANDAGES/DRESSINGS) ×8
APL PRP STRL LF DISP 70% ISPRP (MISCELLANEOUS) ×4
ARMBAND PINK RESTRICT EXTREMIT (MISCELLANEOUS) ×4 IMPLANT
BAG BANDED W/RUBBER/TAPE 36X54 (MISCELLANEOUS) ×4 IMPLANT
BAG COUNTER SPONGE SURGICOUNT (BAG) ×4 IMPLANT
BAG EQP BAND 135X91 W/RBR TAPE (MISCELLANEOUS) ×4
BAG SNAP BAND KOVER 36X36 (MISCELLANEOUS) ×4 IMPLANT
BAG SPNG CNTER NS LX DISP (BAG) ×4
CANISTER PENUMBRA ENGINE (MISCELLANEOUS) ×1 IMPLANT
CANISTER SUCT 3000ML PPV (MISCELLANEOUS) ×4 IMPLANT
CATH ANGIO 5F BER2 65CM (CATHETERS) ×1 IMPLANT
CATH EMB 4FR 80 (CATHETERS) ×4 IMPLANT
CATH LIGHTNING BOLT 7 130 (CATHETERS) ×1 IMPLANT
CATH OMNI FLUSH 5F 65CM (CATHETERS) ×5 IMPLANT
CHLORAPREP W/TINT 26 (MISCELLANEOUS) ×4 IMPLANT
CLIP TI MEDIUM 6 (CLIP) ×4 IMPLANT
CLIP TI WIDE RED SMALL 6 (CLIP) ×4 IMPLANT
CLOSURE PERCLOSE PROSTYLE (VASCULAR PRODUCTS) ×2 IMPLANT
COVER PROBE W GEL 5X96 (DRAPES) ×4 IMPLANT
DERMABOND ADVANCED .7 DNX12 (GAUZE/BANDAGES/DRESSINGS) ×5 IMPLANT
DEVICE TORQUE H2O (MISCELLANEOUS) IMPLANT
DRSG TEGADERM 2-3/8X2-3/4 SM (GAUZE/BANDAGES/DRESSINGS) IMPLANT
ELECT REM PT RETURN 9FT ADLT (ELECTROSURGICAL) ×4
ELECTRODE REM PT RTRN 9FT ADLT (ELECTROSURGICAL) ×4 IMPLANT
FILTER CO2 0.2 MICRON (VASCULAR PRODUCTS) IMPLANT
FILTER CO2 INSUFFLATOR AX1008 (MISCELLANEOUS) IMPLANT
GAUZE 4X4 16PLY ~~LOC~~+RFID DBL (SPONGE) ×4 IMPLANT
GAUZE SPONGE 2X2 STRL 8-PLY (GAUZE/BANDAGES/DRESSINGS) IMPLANT
GLIDEWIRE ADV .035X260CM (WIRE) ×1 IMPLANT
GLOVE SURG SS PI 7.5 STRL IVOR (GLOVE) ×12 IMPLANT
GOWN STRL REUS W/ TWL LRG LVL3 (GOWN DISPOSABLE) ×8 IMPLANT
GOWN STRL REUS W/ TWL XL LVL3 (GOWN DISPOSABLE) ×4 IMPLANT
GOWN STRL REUS W/TWL LRG LVL3 (GOWN DISPOSABLE) ×8
GOWN STRL REUS W/TWL XL LVL3 (GOWN DISPOSABLE) ×4
GUIDEWIRE ANGLED .035X150CM (WIRE) IMPLANT
HEMOSTAT SNOW SURGICEL 2X4 (HEMOSTASIS) IMPLANT
KIT BASIN OR (CUSTOM PROCEDURE TRAY) ×4 IMPLANT
KIT ENCORE 26 ADVANTAGE (KITS) ×1 IMPLANT
KIT TURNOVER KIT B (KITS) ×4 IMPLANT
NDL PERC 18GX7CM (NEEDLE) ×3 IMPLANT
NEEDLE PERC 18GX7CM (NEEDLE) ×4 IMPLANT
NS IRRIG 1000ML POUR BTL (IV SOLUTION) ×4 IMPLANT
PACK CV ACCESS (CUSTOM PROCEDURE TRAY) ×4 IMPLANT
PACK ENDO MINOR (CUSTOM PROCEDURE TRAY) ×4 IMPLANT
PAD ARMBOARD 7.5X6 YLW CONV (MISCELLANEOUS) ×8 IMPLANT
SET FLUSH CO2 (MISCELLANEOUS) IMPLANT
SET MICROPUNCTURE 5F STIFF (MISCELLANEOUS) IMPLANT
SHEATH BRITE TIP 7FR 35CM (SHEATH) ×1 IMPLANT
SHEATH BRITE TIP 7FRX11 (SHEATH) ×1 IMPLANT
SHEATH PINNACLE 5F 10CM (SHEATH) ×1 IMPLANT
SHEATH PINNACLE 7F 10CM (SHEATH) ×2 IMPLANT
STENT VIABAHN 39XCATH 135 (Permanent Stent) ×1 IMPLANT
STENT VIABAHN 8X29X80 VBX (Permanent Stent) ×1 IMPLANT
STENT VIABAHN 8X39 7FR 135 (Permanent Stent) ×4 IMPLANT
STENT VIABAHNBX 8X59X80 (Permanent Stent) IMPLANT
STOPCOCK MORSE 400PSI 3WAY (MISCELLANEOUS) ×1 IMPLANT
SUT PROLENE 6 0 BV (SUTURE) ×4 IMPLANT
SUT VIC AB 3-0 SH 27 (SUTURE) ×4
SUT VIC AB 3-0 SH 27X BRD (SUTURE) ×4 IMPLANT
SUT VICRYL 4-0 PS2 18IN ABS (SUTURE) IMPLANT
SYR 10ML LL (SYRINGE) ×1 IMPLANT
SYR MEDRAD MARK V 150ML (SYRINGE) IMPLANT
TOWEL GREEN STERILE (TOWEL DISPOSABLE) ×4 IMPLANT
TUBING EXTENTION W/L.L. (IV SETS) ×1 IMPLANT
TUBING HIGH PRESSURE 120CM (CONNECTOR) IMPLANT
TUBING INJECTOR 48 (MISCELLANEOUS) ×1 IMPLANT
UNDERPAD 30X36 HEAVY ABSORB (UNDERPADS AND DIAPERS) ×4 IMPLANT
WATER STERILE IRR 1000ML POUR (IV SOLUTION) ×4 IMPLANT
WIRE BENTSON .035X145CM (WIRE) ×6 IMPLANT
WIRE MICRO SET SILHO 5FR 7 (SHEATH) ×1 IMPLANT

## 2023-02-05 NOTE — ED Notes (Signed)
Pt placement called.... currently working on finding another bed.Marland KitchenMarland Kitchen

## 2023-02-05 NOTE — Anesthesia Postprocedure Evaluation (Signed)
Anesthesia Post Note  Patient: Lileah Steidinger Grullon  Procedure(s) Performed: AORTOGRAM WITH RUN OFF (Bilateral: Groin) ULTRASOUND GUIDANCE FOR VASCULAR ACCESS (Bilateral: Groin) INSERTION OF RIGHT ILIAC STENT (Right: Groin) RIGHT PERCUTANEOUS THROMBECTOMY (Right: Groin) RIGHT LOWER EXTREMITY ANGIOGRAM (Groin)     Patient location during evaluation: PACU Anesthesia Type: General Level of consciousness: awake and alert, patient cooperative and oriented Pain management: pain level controlled Vital Signs Assessment: post-procedure vital signs reviewed and stable Respiratory status: spontaneous breathing, nonlabored ventilation and respiratory function stable Cardiovascular status: blood pressure returned to baseline and stable Postop Assessment: no apparent nausea or vomiting Anesthetic complications: no   No notable events documented.  Last Vitals:  Vitals:   02/05/23 1215 02/05/23 1240  BP: (!) 155/53 (!) 151/105  Pulse: 76 74  Resp: 18 (!) 9  Temp: (!) 36.3 C (!) 36.3 C  SpO2: 98% 94%    Last Pain:  Vitals:   02/05/23 1240  TempSrc:   PainSc: 0-No pain                 Aivy Akter,E. Berenise Hunton

## 2023-02-05 NOTE — Anesthesia Procedure Notes (Signed)
Procedure Name: Intubation Date/Time: 02/05/2023 9:16 AM  Performed by: Colin [place], CRNAPre-anesthesia Checklist: Patient identified, Emergency Drugs available, Suction available and Patient being monitored Patient Re-evaluated:Patient Re-evaluated prior to induction Oxygen Delivery Method: Circle system utilized Preoxygenation: Pre-oxygenation with 100% oxygen Induction Type: IV induction Ventilation: Mask ventilation without difficulty Laryngoscope Size: Miller and 2 Grade View: Grade I Tube type: Oral Tube size: 7.0 mm Number of attempts: 1 Airway Equipment and Method: Stylet Placement Confirmation: ETT inserted through vocal cords under direct vision, positive ETCO2 and breath sounds checked- equal and bilateral Secured at: 22 cm Tube secured with: Tape Dental Injury: Teeth and Oropharynx as per pre-operative assessment

## 2023-02-05 NOTE — Progress Notes (Signed)
PROGRESS NOTE  Alice Bennett [id number] DOB: 09-15-1961 DOA: 02/04/2023 PCP: Pcp, No  HPI/Recap of past 24 hours: Alice Bennett is a 62 y.o. female with medical history significant of CAD, ICM with EF 50-55%, DM2, HTN, HLD, PAD s/p R iliac stent by Dr. Gwenlyn Found in September 2023, who presented to the ED with 5-day history of right foot pain and discoloration.  Workup in the ED revealed critical limb ischemia of right lower extremity for which vascular surgery was consulted.  Status post aortogram with runoff, right percutaneous mechanical thrombectomy on 02/05/2023.  02/05/2023: The patient was seen and examined at her bedside post vascular surgery procedure.  No complaint at this time.     Assessment/Plan: Principal Problem:   Critical limb ischemia of right lower extremity (HCC) Active Problems:   Superior mesenteric artery stenosis (HCC)   Ischemic cardiomyopathy   Hyperlipidemia   Hypertension   Peripheral arterial disease ([place])  Critical limb ischemia of right lower extremity (HCC) Superior mesenteric artery stenosis SMA thrombus seen on CTA Thrombus with occlusion / partial occlusion of R iliac stent based on CTA. On heparin drip initially. Seen by vascular surgery, status post aortogram with runoff, right percutaneous mechanical thrombectomy on 02/05/2023. Rest of management per vascular surgery.  Hypokalemia Repleted orally Repeat BMP in the morning, obtain magnesium level.   Hypertension, BP is not at goal, elevated Resume home coreg, aldactone, and irbesartan.   Hyperlipidemia Resume Zetia and Lipitor   Ischemic cardiomyopathy EF 50-55% Continue home ASA, coreg, plavix, ARB, aldactone          Advance Care Planning:   Code Status: Full Code   Consults: Dr. Trula Slade   Family Communication: No family in room     DVT prophylaxis: Defer to vascular surgery to start pharmacological DVT prophylaxis.  Status is: Inpatient status. The patient requires  at least 2 midnights for further evaluation and treatment of present condition.    Objective: Vitals:   02/05/23 1215 02/05/23 1240 02/05/23 1300 02/05/23 1330  BP: (!) 155/53 (!) 151/105 (!) 157/56 (!) 169/62  Pulse: 76 74 76 81  Resp: 18 (!) 9 11 13   Temp: (!) 97.3 F (36.3 C) (!) 97.3 F (36.3 C)    TempSrc:      SpO2: 98% 94% 95% 97%  Weight:      Height:        Intake/Output Summary (Last 24 hours) at 02/05/2023 1501 Last data filed at 02/05/2023 1241 Gross per 24 hour  Intake 2679.32 ml  Output 790 ml  Net 1889.32 ml   Filed Weights   02/04/23 1336 02/05/23 0823  Weight: 101.6 kg 101.6 kg    Exam:  General: 62 y.o. year-old female well developed well nourished in no acute distress.  Alert and oriented x3. Cardiovascular: Regular rate and rhythm with no rubs or gallops.  No thyromegaly or JVD noted.   Respiratory: Clear to auscultation with no wheezes or rales. Good inspiratory effort. Abdomen: Soft nontender nondistended with normal bowel sounds x4 quadrants. Musculoskeletal: No lower extremity edema. 2/4 pulses in all 4 extremities. Skin: No ulcerative lesions noted or rashes, Psychiatry: Mood is appropriate for condition and setting   Data Reviewed: CBC: Recent Labs  Lab 02/04/23 1652 02/05/23 0106  WBC 5.3 5.4  NEUTROABS 2.9  --   HGB 14.6 12.2  HCT 43.4 37.8  MCV 87.0 88.9  PLT 164 [id number]*   Basic Metabolic Panel: Recent Labs  Lab 02/04/23 1652 02/05/23 0106  NA 136  137  K 3.8 3.4*  CL 102 104  CO2 24 26  GLUCOSE 112* 194*  BUN 12 12  CREATININE 1.14* 1.06*  CALCIUM 8.7* 8.2*   GFR: Estimated Creatinine Clearance: 70.7 mL/min (A) (by C-G formula based on SCr of 1.06 mg/dL (H)). Liver Function Tests: No results for input(s): "AST", "ALT", "ALKPHOS", "BILITOT", "PROT", "ALBUMIN" in the last 168 hours. No results for input(s): "LIPASE", "AMYLASE" in the last 168 hours. No results for input(s): "AMMONIA" in the last 168 hours. Coagulation  Profile: No results for input(s): "INR", "PROTIME" in the last 168 hours. Cardiac Enzymes: No results for input(s): "CKTOTAL", "CKMB", "CKMBINDEX", "TROPONINI" in the last 168 hours. BNP (last 3 results) No results for input(s): "PROBNP" in the last 8760 hours. HbA1C: No results for input(s): "HGBA1C" in the last 72 hours. CBG: Recent Labs  Lab 02/05/23 0044 02/05/23 0347 02/05/23 0812 02/05/23 1130  GLUCAP 193* 139* 117* 175*   Lipid Profile: No results for input(s): "CHOL", "HDL", "LDLCALC", "TRIG", "CHOLHDL", "LDLDIRECT" in the last 72 hours. Thyroid Function Tests: No results for input(s): "TSH", "T4TOTAL", "FREET4", "T3FREE", "THYROIDAB" in the last 72 hours. Anemia Panel: No results for input(s): "VITAMINB12", "FOLATE", "FERRITIN", "TIBC", "IRON", "RETICCTPCT" in the last 72 hours. Urine analysis:    Component Value Date/Time   COLORURINE STRAW (A) 06/09/2020 0329   APPEARANCEUR CLEAR 06/09/2020 0329   LABSPEC 1.010 06/09/2020 0329   PHURINE 5.0 06/09/2020 0329   GLUCOSEU >=500 (A) 06/09/2020 0329   HGBUR SMALL (A) 06/09/2020 0329   BILIRUBINUR NEGATIVE 06/09/2020 0329   KETONESUR NEGATIVE 06/09/2020 0329   PROTEINUR NEGATIVE 06/09/2020 0329   NITRITE NEGATIVE 06/09/2020 0329   LEUKOCYTESUR NEGATIVE 06/09/2020 0329   Sepsis Labs: @LABRCNTIP (procalcitonin:4,lacticidven:4)  ) Recent Results (from the past 240 hour(s))  Surgical pcr screen     Status: None   Collection Time: 02/05/23  9:41 AM   Specimen: Nasal Mucosa; Nasal Swab  Result Value Ref Range Status   MRSA, PCR NEGATIVE NEGATIVE Final   Staphylococcus aureus NEGATIVE NEGATIVE Final    Comment: (NOTE) The Xpert SA Assay (FDA approved for NASAL specimens in patients 34 years of age and older), is one component of a comprehensive surveillance program. It is not intended to diagnose infection nor to guide or monitor treatment. Performed at [facility], [place] [address] [place], [place] [address]        Studies: HYBRID OR IMAGING ([place])  Result Date: 02/05/2023 There is no interpretation for this exam.  This order is for images obtained during a surgical procedure.  Please See "Surgeries" Tab for more information regarding the procedure.   HYBRID OR IMAGING ([facility] ONLY)  Result Date: 02/05/2023 There is no interpretation for this exam.  This order is for images obtained during a surgical procedure.  Please See "Surgeries" Tab for more information regarding the procedure.   CT ANGIO LOWER EXT BILAT W &/OR WO CONTRAST  Result Date: 02/04/2023 CLINICAL DATA:  Bilateral lower extremity claudication/ischemia. EXAM: CT ANGIOGRAPHY OF ABDOMINAL AORTA WITH ILIOFEMORAL RUNOFF TECHNIQUE: Multidetector CT imaging of the abdomen, pelvis and lower extremities was performed using the standard protocol during bolus administration of intravenous contrast. Multiplanar CT image reconstructions and MIPs were obtained to evaluate the vascular anatomy. RADIATION DOSE REDUCTION: This exam was performed according to the departmental dose-optimization program which includes automated exposure control, adjustment of the mA and/or kV according to patient size and/or use of iterative reconstruction technique. CONTRAST:  183mL OMNIPAQUE IOHEXOL 350 MG/ML SOLN  COMPARISON:  None Available. FINDINGS: Evaluation is limited due to suboptimal opacification of the vessels. VASCULAR Aorta: Moderate atherosclerotic calcification of the distal aorta. No aneurysmal dilatation or dissection. No periaortic fluid collection. Celiac: The celiac artery appears patent for the degree of opacification. SMA: There is nonocclusive clot in the proximal 17 mm segment of the SMA with partial luminal narrowing and opacification. The SMA remains patent distal to this segment. Evaluation is limited due to suboptimal vascular opacification. Renals: The renal arteries are suboptimally opacified but appear patent. IMA: The IMA is suboptimally opacified  and not well evaluated. RIGHT Lower Extremity Inflow: Right common iliac artery stent suboptimally evaluated due to poor vascular opacification. There is apparent hypodensity in the distal lumen of the stent. A partial occlusion of the distal half of the stent is not excluded. The right external iliac artery and right internal iliac artery appear patent for the degree of opacification. Outflow: There is mild atherosclerotic calcification of the superficial femoral artery. The common femoral artery, superficial and deep femoral arteries, and the popliteal artery are patent. There is overall decreased in the size of the arterial lumen. Runoff: Evaluation of the calf arteries is very limited due to timing of the contrast. The posterior tibial artery appears patent. The proximal and midportion of the fibular artery as well as the proximal anterior tibial artery appear patent. Evaluation of the distal portion of the anterior tibial artery is very limited on this exam. LEFT Lower Extremity Inflow: Moderate atherosclerotic calcification of the left common iliac artery with associated luminal narrowing. The left common iliac artery and the external and internal iliac arteries appear patent. Outflow: Mild atherosclerotic calcification of the superficial femoral artery. The common femoral artery, deep and superficial femoral arteries, and the popliteal artery appear patent. There is overall decrease luminal size of the arteries. Runoff: Evaluation of the calf arteries is very limited on this exam. The posterior tibial and fibular arteries appear patent. The anterior tibial artery is poorly visualized and suboptimally evaluated. Veins: No obvious venous abnormality within the limitations of this arterial phase study. Review of the MIP images confirms the above findings. NON-VASCULAR Lower chest: The visualized lung bases are clear. No intra-abdominal free air or free fluid. Hepatobiliary: No focal liver abnormality is seen. No  gallstones, gallbladder wall thickening, or biliary dilatation. Pancreas: Unremarkable. No pancreatic ductal dilatation or surrounding inflammatory changes. Spleen: Normal in size without focal abnormality. Adrenals/Urinary Tract: The right adrenal gland is unremarkable. A 2 cm left adrenal nodule similar to the CT of 2020 and previously characterized as adenoma. No imaging follow-up. There is a 3 mm nonobstructing right renal inferior pole and a punctate left renal inter pole calculi. There is no hydronephrosis on either side. The visualized ureters and urinary bladder appear unremarkable. Stomach/Bowel: There is no bowel obstruction or active inflammation. The appendix is normal. Lymphatic: No adenopathy. Reproductive: The uterus and ovaries are grossly unremarkable. No adnexal masses. Other: None Musculoskeletal: No acute osseous pathology. IMPRESSION: 1. Limited evaluation due to suboptimal opacification of the vessels. 2. Nonocclusive clot in the proximal 17 mm segment of the SMA with partial luminal narrowing. The SMA remains patent distal to this segment. 3. Artifact versus possible partial occlusion of the distal right common iliac artery stent. The right internal and external iliac arteries appear patent for the degree of opacification. 4. Patent appearance of the posterior tibial and fibular arteries bilaterally. The anterior tibial arteries are suboptimally evaluated. 5. No bowel obstruction. Normal appendix. 6. Small nonobstructing  bilateral renal calculi. No hydronephrosis. Electronically Signed   By: Anner Crete M.D.   On: 02/04/2023 22:03    Scheduled Meds:  aspirin EC  81 mg Oral Daily   atorvastatin  80 mg Oral q1800   carvedilol  6.25 mg Oral BID WC   cholecalciferol  5,000 Units Oral Daily   clonazePAM  1 mg Oral BID   clopidogrel  75 mg Oral Daily   ezetimibe  10 mg Oral Daily   icosapent Ethyl  1 g Oral BID   insulin aspart  0-9 Units Subcutaneous Q4H   irbesartan  150 mg Oral  Daily   pantoprazole  40 mg Oral Daily   potassium chloride  40 mEq Oral NOW   pregabalin  150 mg Oral BID   sertraline  100 mg Oral Daily   spironolactone  25 mg Oral Daily    Continuous Infusions:  lactated ringers 125 mL/hr at 02/05/23 1357     LOS: 0 days     Kayleen Memos, MD [facility] Pager [phone]  If 7PM-7AM, please contact night-coverage [email or web address] Password [email or web address] 02/05/2023, 3:01 PM

## 2023-02-05 NOTE — Interval H&P Note (Signed)
History and Physical Interval Note:  02/05/2023 8:54 AM  Alice Bennett  has presented today for surgery, with the diagnosis of Rest pain.  The various methods of treatment have been discussed with the patient and family. After consideration of risks, benefits and other options for treatment, the patient has consented to  Procedure(s): AORTOGRAM WITH RUN OFF (N/A) THROMBECTOMY (N/A) as a surgical intervention.  The patient's history has been reviewed, patient examined, no change in status, stable for surgery.  I have reviewed the patient's chart and labs.  Questions were answered to the patient's satisfaction.     [name]

## 2023-02-05 NOTE — Transfer of Care (Signed)
Immediate Anesthesia Transfer of Care Note  Patient: Alice Bennett  Procedure(s) Performed: AORTOGRAM WITH RUN OFF (Bilateral: Groin) ULTRASOUND GUIDANCE FOR VASCULAR ACCESS (Bilateral: Groin) INSERTION OF RIGHT ILIAC STENT (Right: Groin) RIGHT PERCUTANEOUS THROMBECTOMY (Right: Groin) RIGHT LOWER EXTREMITY ANGIOGRAM (Groin)  Patient Location: PACU  Anesthesia Type:General  Level of Consciousness: drowsy and patient cooperative  Airway & Oxygen Therapy: Patient Spontanous Breathing and Patient connected to nasal cannula oxygen  Post-op Assessment: Report given to RN and Post -op Vital signs reviewed and stable  Post vital signs: Reviewed and stable  Last Vitals:  Vitals Value Taken Time  BP 153/55 02/05/23 1127  Temp    Pulse 79 02/05/23 1129  Resp 9 02/05/23 1129  SpO2 97 % 02/05/23 1129  Vitals shown include unvalidated device data.  Last Pain:  Vitals:   02/05/23 0759  TempSrc: Oral  PainSc:          Complications: No notable events documented.

## 2023-02-05 NOTE — Progress Notes (Signed)
ANTICOAGULATION CONSULT NOTE - Follow Up Consult  Pharmacy Consult for heparin Indication:  critical limb ischemia  Labs: Recent Labs    02/04/23 1620 02/04/23 1652 02/05/23 0106 02/05/23 0349 02/05/23 0438  HGB  --  14.6 12.2  --   --   HCT  --  43.4 37.8  --   --   PLT  --  164 149*  --   --   APTT 32  --   --   --   --   HEPARINUNFRC  --   --   --  >1.10* 0.22*  CREATININE  --  1.14* 1.06*  --   --     Assessment: 61yo female subtherapeutic on heparin with initial dosing for ischemic leg; no infusion issues or signs of bleeding per RN.  Goal of Therapy:  Heparin level 0.3-0.7 units/ml   Plan:  Will give small heparin bolus of 2000 units, increase heparin infusion by 3 units/kg/hr to 1700 units/hr and check level in 6 hours.    Wynona Neat, PharmD, BCPS  02/05/2023,5:53 AM

## 2023-02-05 NOTE — Op Note (Signed)
Patient name: Alice Bennett MRN: [id number] DOB: [birth year] Sex: female  02/05/2023 Pre-operative Diagnosis: Right leg rest pain Post-operative diagnosis:  Same Surgeon:  [name] Procedure Performed:  1.  Ultrasound-guided access, left femoral artery  2.  Ultrasound-guided access, right femoral artery  3.  Abdominal aortogram  4.  Right lower extremity runoff  5.  Percutaneous mechanical thrombectomy using the CAT 7 penumbra device  6.  Stent, right common iliac artery using overlapping 8 mm VBX stents  7.  Closure device, bilateral femoral arteries (Pro-glide)   Indications: This is a 62 year old female with history of right common iliac artery stent by Dr. Gwenlyn Found in September 2023.  She presented to the emergency department with a several day history of discoloration in her toes and the inability to ambulate due to pain in her hip and thigh.  On exam, she did not have a palpable femoral pulse and had monophasic Doppler signals on the right posterior tibial artery compared to triphasic on the left.  She had a CT scan to evaluate her iliac stent which was inconclusive due to the timing of the contrast.  Clinically, she appears to have a inflow issue and so I felt angiography was indicated.  I discussed the details of the procedure with the patient and she wished to proceed.  Procedure:  The patient was identified in the holding area and taken to the OR 16.  The patient was then placed supine on the table.  General anesthesia was administered.  The patient was prepped and draped in the usual sterile fashion.  A time out was called.   Ultrasound was used to evaluate the left common femoral artery.  It was patent .  A digital ultrasound image was acquired.  A micropuncture needle was used to access the left common femoral artery under ultrasound guidance.  An 018 wire was advanced without resistance and a micropuncture sheath was placed.  The 018 wire was removed and a benson wire was  placed.  The micropuncture sheath was exchanged for a 5 french sheath.  An omniflush catheter was advanced over the wire to the level of L-1.  An abdominal angiogram was obtained.   Findings:   Aortogram: Distal abdominal aorta is widely patent.  The left common and external iliac arteries are widely patent.  The right common iliac artery is occluded with reconstitution of the right external iliac artery from hypogastric collaterals  Right Lower Extremity: The right common femoral profundofemoral and superficial femoral artery are patent.  There is a distal femoral-popliteal stenosis, less than 50%.  The dominant runoff is the posterior tibial artery    Intervention: After the above images were acquired the decision was made to proceed with intervention.  I felt the best way to address her iliac occlusion was ipsilateral access.  The right common femoral artery was evaluated with ultrasound and found to be widely patent.  It was cannulated under ultrasound guidance with a micropuncture needle.  A 018 wire was then inserted followed by placement of micropuncture sheath.  A Bentson wire was then inserted.  I then placed a 7 French sheath.  The patient was fully heparinized.  Using a Berenstein 2 catheter and a Glidewire advantage wire, the occluded stent was successfully crossed and the wire was advanced into the aorta.  I then used the penumbra CAT 7 device to perform mechanical thrombectomy of the common iliac artery.  Small amounts of thrombus were evacuated.  Completion imaging showed persistent occlusion  of the stent however I felt that there was an issue at the proximal stent as I had difficulty getting the thrombectomy device through this area.  I felt this would best be treated with stenting.  I then advanced a 7 [place] Brite tip sheath from the right groin into the aorta.  I selected a 8 x 39 VBX stent and landed this at the origin of the right common iliac artery.  The stent extended into the distal  common iliac artery.  There was still about a centimeter of artery that I felt needed to be covered proximal to the hypogastric origin and so a second 8 x 29 VBX stent was then inserted and deployed landing at the level of the right hypogastric artery.  Both stents were taken to 12 atm.  Completion imaging showed resolution of the stenosis with inline flow through the iliac system.  We checked Doppler signals in the patient's feet and she had triphasic posterior tibial Doppler signals.  Both groins were then closed with a ProGlide device.  The heparin was reversed with 30 mg of protamine.  We again checked signals in her feet and she had triphasic posterior tibial Doppler signals.  She was successfully extubated taken recovery in stable condition  Impression:  #1  Successful mechanical thrombectomy of an occluded right common iliac artery stent with subsequent stenting of the entire length of the right common iliac artery using overlapping 8 mm VBX stents  #2  Moderate distal SFA/popliteal focal stenosis, less than 50%  #3  Dominant runoff is the posterior tibial artery    V. [name], M.D., [facility] [place] Office: [phone] Pager:  [phone]

## 2023-02-05 NOTE — Anesthesia Preprocedure Evaluation (Addendum)
Anesthesia Evaluation  Patient identified by MRN, date of birth, ID band Patient awake    Reviewed: Allergy & Precautions, NPO status , Patient's Chart, lab work & pertinent test results, reviewed documented beta blocker date and time   History of Anesthesia Complications Negative for: history of anesthetic complications  Airway Mallampati: I  TM Distance: >3 FB Neck ROM: Full    Dental  (+) Edentulous Upper, Edentulous Lower   Pulmonary COPD,  COPD inhaler, Current Smoker and Patient abstained from smoking.   breath sounds clear to auscultation       Cardiovascular hypertension, Pt. on medications and Pt. on home beta blockers pulmonary hypertension(-) angina + CAD, + Past MI, + Cardiac Stents and + Peripheral Vascular Disease   Rhythm:Regular Rate:Normal  '23 ECHO: Mid/distal septal hypokinesis .EF 50 to 55%. The left ventricle has low normal function. There is mild LVH. Left ventricular diastolic parameters were normal.  RVFis normal, no significant valvular abnormalities  '22 Stress: The left ventricular ejection fraction is normal (55-65%). Nuclear stress EF: 62%. There was no ST segment deviation noted during stress. Findings consistent with prior myocardial infarction    Neuro/Psych negative neurological ROS     GI/Hepatic Neg liver ROS,GERD  Medicated and Controlled,,  Endo/Other  diabetes (glu 139)  Ozempic: last dose 10d ago Glu 117 BMI 33  Renal/GU negative Renal ROS     Musculoskeletal   Abdominal   Peds  Hematology plavix   Anesthesia Other Findings   Reproductive/Obstetrics                             Anesthesia Physical Anesthesia Plan  ASA: 3  Anesthesia Plan: General   Post-op Pain Management: Tylenol PO (pre-op)*   Induction:   PONV Risk Score and Plan: 2 and Ondansetron and Dexamethasone  Airway Management Planned: Oral ETT  Additional Equipment:    Intra-op Plan:   Post-operative Plan: Extubation in OR  Informed Consent: I have reviewed the patients History and Physical, chart, labs and discussed the procedure including the risks, benefits and alternatives for the proposed anesthesia with the patient or authorized representative who has indicated his/her understanding and acceptance.       Plan Discussed with: CRNA and Surgeon  Anesthesia Plan Comments:         Anesthesia Quick Evaluation

## 2023-02-05 NOTE — ED Notes (Signed)
ED TO INPATIENT HANDOFF REPORT  ED Nurse Name and Phone #: Marye Round [phone]  S Name/Age/Gender Alice Bennett 62 y.o. female Room/Bed: 035C/035C  Code Status   Code Status: Full Code  Home/SNF/Other Home Patient oriented to: self, place, time, and situation Is this baseline? Yes   Triage Complete: Triage complete  Chief Complaint Critical limb ischemia of right lower extremity [I70.221]  Triage Note Pt came in via POV d/t Rt hip pain, numbness on that side, toes discoloration & cramps in the Rt foot. Decreased ability to bear weight on that foot d/t 10/10 pain, A/OX4 while in triage.    Allergies Allergies  Allergen Reactions   Codeine Itching    Level of Care/Admitting Diagnosis ED Disposition     ED Disposition  Admit   Condition  --   Comment  Hospital Area: [place] [[id number]]  Level of Care: Med-Surg [16]  May place patient in observation at [facility] or [place] if equivalent level of care is available:: No  Covid Evaluation: Asymptomatic - no recent exposure (last 10 days) testing not required  Diagnosis: Critical limb ischemia of right lower extremity [id number]  Admitting Physician: Etta Quill [id number]  Attending Physician: Etta Quill [id number]          B Medical/Surgery History Past Medical History:  Diagnosis Date   CAD (coronary artery disease)    07/10/17 PCI/DES to LAD, EF 35-40%   Diabetes mellitus without complication ([place])    Hyperlipidemia    Hypertension    NSTEMI (non-ST elevated myocardial infarction) [facility])    Past Surgical History:  Procedure Laterality Date   ABDOMINAL AORTOGRAM W/LOWER EXTREMITY N/A 07/26/2022   Procedure: ABDOMINAL AORTOGRAM W/LOWER EXTREMITY;  Surgeon: Lorretta Harp, MD;  Location: [place] [facility];  Service: Cardiovascular;  Laterality: N/A;   CORONARY STENT INTERVENTION N/A 07/10/2017   Procedure: CORONARY STENT INTERVENTION;  Surgeon: Martinique, Peter M, MD;  Location: [place] [facility];  Service: Cardiovascular;  Laterality: N/A;   CORONARY STENT INTERVENTION N/A 05/31/2022   Procedure: CORONARY STENT INTERVENTION;  Surgeon: Nelva Bush, MD;  Location: [place] [facility];  Service: Cardiovascular;  Laterality: N/A;   INTRAVASCULAR PRESSURE WIRE/FFR STUDY N/A 07/10/2017   Procedure: INTRAVASCULAR PRESSURE WIRE/FFR STUDY;  Surgeon: Martinique, Peter M, MD;  Location: [place] [facility];  Service: Cardiovascular;  Laterality: N/A;   INTRAVASCULAR PRESSURE WIRE/FFR STUDY N/A 05/31/2022   Procedure: INTRAVASCULAR PRESSURE WIRE/FFR STUDY;  Surgeon: Nelva Bush, MD;  Location: [place] [facility];  Service: Cardiovascular;  Laterality: N/A;   LEFT HEART CATH AND CORONARY ANGIOGRAPHY N/A 07/10/2017   Procedure: LEFT HEART CATH AND CORONARY ANGIOGRAPHY;  Surgeon: Martinique, Peter M, MD;  Location: [place] [facility];  Service: Cardiovascular;  Laterality: N/A;   PERIPHERAL VASCULAR INTERVENTION  07/26/2022   Procedure: PERIPHERAL VASCULAR INTERVENTION;  Surgeon: Lorretta Harp, MD;  Location: [place] [facility];  Service: Cardiovascular;;   RIGHT/LEFT HEART CATH AND CORONARY ANGIOGRAPHY N/A 05/31/2022   Procedure: RIGHT/LEFT HEART CATH AND CORONARY ANGIOGRAPHY;  Surgeon: Nelva Bush, MD;  Location: [place] [facility];  Service: Cardiovascular;  Laterality: N/A;     A IV Location/Drains/Wounds Patient Lines/Drains/Airways Status     Active Line/Drains/Airways     Name Placement date Placement time Site Days   Peripheral IV 02/04/23 20 G Posterior;Right Hand 02/04/23  1614  Hand  1   Peripheral IV 02/04/23 20 G Right Antecubital 02/04/23  1925  Antecubital  1            Intake/Output Last 24 hours  Intake/Output Summary (Last 24 hours) at 02/05/2023 0422 Last data filed at 02/05/2023 0035 Gross per 24 hour  Intake 898.78 ml  Output --  Net 898.78 ml    Labs/Imaging Results for orders placed or performed during the hospital encounter of 02/04/23 (from  the past 48 hour(s))  APTT     Status: None   Collection Time: 02/04/23  4:20 PM  Result Value Ref Range   aPTT 32 24 - 36 seconds    Comment: Performed at [facility], [address], [place], [place] [id number]  Basic metabolic panel     Status: Abnormal   Collection Time: 02/04/23  4:52 PM  Result Value Ref Range   Sodium 136 135 - 145 mmol/L   Potassium 3.8 3.5 - 5.1 mmol/L   Chloride 102 98 - 111 mmol/L   CO2 24 22 - 32 mmol/L   Glucose, Bld 112 (H) 70 - 99 mg/dL    Comment: Glucose reference range applies only to samples taken after fasting for at least 8 hours.   BUN 12 8 - 23 mg/dL   Creatinine, Ser 1.14 (H) 0.44 - 1.00 mg/dL   Calcium 8.7 (L) 8.9 - 10.3 mg/dL   GFR, Estimated 55 (L) >60 mL/min    Comment: (NOTE) Calculated using the CKD-EPI Creatinine Equation (2021)    Anion gap 10 5 - 15    Comment: Performed at [facility] [address] [place], [place] [address]  CBC with Differential/Platelet     Status: None   Collection Time: 02/04/23  4:52 PM  Result Value Ref Range   WBC 5.3 4.0 - 10.5 K/uL   RBC 4.99 3.87 - 5.11 MIL/uL   Hemoglobin 14.6 12.0 - 15.0 g/dL   HCT 43.4 36.0 - 46.0 %   MCV 87.0 80.0 - 100.0 fL   MCH 29.3 26.0 - 34.0 pg   MCHC 33.6 30.0 - 36.0 g/dL   RDW 13.9 11.5 - 15.5 %   Platelets 164 150 - 400 K/uL   nRBC 0.0 0.0 - 0.2 %   Neutrophils Relative % 55 %   Neutro Abs 2.9 1.7 - 7.7 K/uL   Lymphocytes Relative 34 %   Lymphs Abs 1.8 0.7 - 4.0 K/uL   Monocytes Relative 9 %   Monocytes Absolute 0.5 0.1 - 1.0 K/uL   Eosinophils Relative 2 %   Eosinophils Absolute 0.1 0.0 - 0.5 K/uL   Basophils Relative 0 %   Basophils Absolute 0.0 0.0 - 0.1 K/uL   Immature Granulocytes 0 %   Abs Immature Granulocytes 0.02 0.00 - 0.07 K/uL    Comment: Performed at [facility], [address], [place], [place] [address]  CBG monitoring, ED     Status: Abnormal   Collection Time: 02/05/23 12:44 AM  Result Value Ref Range    Glucose-Capillary 193 (H) 70 - 99 mg/dL    Comment: Glucose reference range applies only to samples taken after fasting for at least 8 hours.  CBC     Status: Abnormal   Collection Time: 02/05/23  1:06 AM  Result Value Ref Range   WBC 5.4 4.0 - 10.5 K/uL   RBC 4.25 3.87 - 5.11 MIL/uL   Hemoglobin 12.2 12.0 - 15.0 g/dL   HCT 37.8 36.0 - 46.0 %   MCV 88.9 80.0 - 100.0 fL   MCH 28.7 26.0 -  34.0 pg   MCHC 32.3 30.0 - 36.0 g/dL   RDW 14.1 11.5 - 15.5 %   Platelets 149 (L) 150 - 400 K/uL   nRBC 0.0 0.0 - 0.2 %    Comment: Performed at [facility], [address], [place], [place] [id number]  Basic metabolic panel     Status: Abnormal   Collection Time: 02/05/23  1:06 AM  Result Value Ref Range   Sodium 137 135 - 145 mmol/L   Potassium 3.4 (L) 3.5 - 5.1 mmol/L   Chloride 104 98 - 111 mmol/L   CO2 26 22 - 32 mmol/L   Glucose, Bld 194 (H) 70 - 99 mg/dL    Comment: Glucose reference range applies only to samples taken after fasting for at least 8 hours.   BUN 12 8 - 23 mg/dL   Creatinine, Ser 1.06 (H) 0.44 - 1.00 mg/dL   Calcium 8.2 (L) 8.9 - 10.3 mg/dL   GFR, Estimated 60 (L) >60 mL/min    Comment: (NOTE) Calculated using the CKD-EPI Creatinine Equation (2021)    Anion gap 7 5 - 15    Comment: Performed at [facility] [address], [place], [place] [address]  CBG monitoring, ED     Status: Abnormal   Collection Time: 02/05/23  3:47 AM  Result Value Ref Range   Glucose-Capillary 139 (H) 70 - 99 mg/dL    Comment: Glucose reference range applies only to samples taken after fasting for at least 8 hours.  Heparin level (unfractionated)     Status: Abnormal   Collection Time: 02/05/23  3:49 AM  Result Value Ref Range   Heparin Unfractionated >1.10 (H) 0.30 - 0.70 IU/mL    Comment: (NOTE) The clinical reportable range upper limit is being lowered to >1.10 to align with the FDA approved guidance for the current laboratory assay.  If heparin results are below expected  values, and patient dosage has  been confirmed, suggest follow up testing of antithrombin III levels. Performed at [facility], [place] [address], [place], [place] [address]    CT ANGIO LOWER EXT BILAT W &/OR WO CONTRAST  Result Date: 02/04/2023 CLINICAL DATA:  Bilateral lower extremity claudication/ischemia. EXAM: CT ANGIOGRAPHY OF ABDOMINAL AORTA WITH ILIOFEMORAL RUNOFF TECHNIQUE: Multidetector CT imaging of the abdomen, pelvis and lower extremities was performed using the standard protocol during bolus administration of intravenous contrast. Multiplanar CT image reconstructions and MIPs were obtained to evaluate the vascular anatomy. RADIATION DOSE REDUCTION: This exam was performed according to the departmental dose-optimization program which includes automated exposure control, adjustment of the mA and/or kV according to patient size and/or use of iterative reconstruction technique. CONTRAST:  183mL OMNIPAQUE IOHEXOL 350 MG/ML SOLN COMPARISON:  None Available. FINDINGS: Evaluation is limited due to suboptimal opacification of the vessels. VASCULAR Aorta: Moderate atherosclerotic calcification of the distal aorta. No aneurysmal dilatation or dissection. No periaortic fluid collection. Celiac: The celiac artery appears patent for the degree of opacification. SMA: There is nonocclusive clot in the proximal 17 mm segment of the SMA with partial luminal narrowing and opacification. The SMA remains patent distal to this segment. Evaluation is limited due to suboptimal vascular opacification. Renals: The renal arteries are suboptimally opacified but appear patent. IMA: The IMA is suboptimally opacified and not well evaluated. RIGHT Lower Extremity Inflow: Right common iliac artery stent suboptimally evaluated due to poor vascular opacification. There is apparent hypodensity in the distal lumen of the stent. A partial occlusion of  the distal half of the stent is not excluded. The right external iliac artery  and right internal iliac artery appear patent for the degree of opacification. Outflow: There is mild atherosclerotic calcification of the superficial femoral artery. The common femoral artery, superficial and deep femoral arteries, and the popliteal artery are patent. There is overall decreased in the size of the arterial lumen. Runoff: Evaluation of the calf arteries is very limited due to timing of the contrast. The posterior tibial artery appears patent. The proximal and midportion of the fibular artery as well as the proximal anterior tibial artery appear patent. Evaluation of the distal portion of the anterior tibial artery is very limited on this exam. LEFT Lower Extremity Inflow: Moderate atherosclerotic calcification of the left common iliac artery with associated luminal narrowing. The left common iliac artery and the external and internal iliac arteries appear patent. Outflow: Mild atherosclerotic calcification of the superficial femoral artery. The common femoral artery, deep and superficial femoral arteries, and the popliteal artery appear patent. There is overall decrease luminal size of the arteries. Runoff: Evaluation of the calf arteries is very limited on this exam. The posterior tibial and fibular arteries appear patent. The anterior tibial artery is poorly visualized and suboptimally evaluated. Veins: No obvious venous abnormality within the limitations of this arterial phase study. Review of the MIP images confirms the above findings. NON-VASCULAR Lower chest: The visualized lung bases are clear. No intra-abdominal free air or free fluid. Hepatobiliary: No focal liver abnormality is seen. No gallstones, gallbladder wall thickening, or biliary dilatation. Pancreas: Unremarkable. No pancreatic ductal dilatation or surrounding inflammatory changes. Spleen: Normal in size without focal abnormality. Adrenals/Urinary Tract: The right adrenal gland is unremarkable. 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 No imaging follow-up. There is a 3 mm nonobstructing right renal inferior pole and a punctate left renal inter pole calculi. There is no hydronephrosis on either side. The visualized ureters and urinary bladder appear unremarkable. Stomach/Bowel: There is no bowel obstruction or active inflammation. The appendix is normal. Lymphatic: No adenopathy. Reproductive: The uterus and ovaries are grossly unremarkable. No adnexal masses. Other: None Musculoskeletal: No acute osseous pathology. IMPRESSION: 1. Limited evaluation due to suboptimal opacification of the vessels. 2. Nonocclusive clot in the proximal 17 mm segment of the SMA with partial luminal narrowing. The SMA remains patent distal to this segment. 3. Artifact versus possible partial occlusion of the distal right common iliac artery stent. The right internal and external iliac arteries appear patent for the degree of opacification. 4. Patent appearance of the posterior tibial and fibular arteries bilaterally. The anterior tibial arteries are suboptimally evaluated. 5. No bowel obstruction. Normal appendix. 6. Small nonobstructing bilateral renal calculi. No hydronephrosis. Electronically Signed   By: Anner Crete M.D.   On: 02/04/2023 22:03    Pending Labs Unresulted Labs (From admission, onward)     Start     Ordered   02/05/23 0500  Heparin level (unfractionated)  Daily,   R     See Hyperspace for full Linked Orders Report.   02/04/23 2205   02/05/23 0500  CBC  Daily,   R     See Hyperspace for full Linked Orders Report.   02/04/23 2205   02/04/23 2341  Hemoglobin A1c  Once,   R       Comments: To assess prior glycemic control    02/04/23 2341   02/04/23 2330  HIV Antibody (routine testing  w rflx)  (HIV Antibody (Routine testing w reflex) panel)  Once,   R        02/04/23 2339   02/04/23 1541  CBC with Differential/Platelet  Once,   STAT        02/04/23 1540             Vitals/Pain Today's Vitals   02/05/23 0100 02/05/23 0143 02/05/23 0343 02/05/23 0348  BP: 115/62   120/75  Pulse: 68   68  Resp: (!) 21   20  Temp:  98.1 F (36.7 C)  98.3 F (36.8 C)  TempSrc:  Oral  Oral  SpO2: 97%   100%  Weight:      Height:      PainSc:  Asleep 10-Worst pain ever     Isolation Precautions No active isolations  Medications Medications  lactated ringers infusion ( Intravenous Rate/Dose Verify 02/05/23 0354)  heparin ADULT infusion 100 units/mL (25000 units/230mL) (1,400 Units/hr Intravenous Rate/Dose Verify 02/05/23 0355)  acetaminophen (TYLENOL) tablet 650 mg (has no administration in time range)    Or  acetaminophen (TYLENOL) suppository 650 mg (has no administration in time range)  ondansetron (ZOFRAN) tablet 4 mg (has no administration in time range)    Or  ondansetron (ZOFRAN) injection 4 mg (has no administration in time range)  atorvastatin (LIPITOR) tablet 80 mg (has no administration in time range)  carvedilol (COREG) tablet 6.25 mg (has no administration in time range)  pregabalin (LYRICA) capsule 150 mg (150 mg Oral Given 02/05/23 0100)  insulin aspart (novoLOG) injection 0-9 Units (1 Units Subcutaneous Given 02/05/23 0357)  ezetimibe (ZETIA) tablet 10 mg (has no administration in time range)  clonazePAM (KLONOPIN) tablet 1 mg (1 mg Oral Given 02/05/23 0100)  cholecalciferol (VITAMIN D3) 25 MCG (1000 UNIT) tablet 5,000 Units (has no administration in time range)  pantoprazole (PROTONIX) EC tablet 40 mg (has no administration in time range)  irbesartan (AVAPRO) tablet 150 mg (has no administration in time range)  sertraline (ZOLOFT) tablet 100 mg (has no administration in time range)  spironolactone (ALDACTONE) tablet 25 mg (has no administration in time range)  icosapent Ethyl (VASCEPA) 1 g capsule 1 g (1 g Oral Given 02/05/23 0101)  aspirin EC tablet 81 mg (has no administration in time range)  clopidogrel (PLAVIX) tablet 75 mg (has no  administration in time range)  morphine (PF) 2 MG/ML injection 2-4 mg (2 mg Intravenous Given 02/05/23 0354)  morphine (PF) 4 MG/ML injection 6 mg (6 mg Intravenous Given 02/04/23 1618)  iohexol (OMNIPAQUE) 350 MG/ML injection 100 mL (100 mLs Intravenous Contrast Given 02/04/23 2131)  heparin bolus via infusion 4,000 Units (4,000 Units Intravenous Bolus from Bag 02/04/23 2242)    Mobility walks     Focused Assessments Cardiac Assessment Handoff:    Lab Results  Component Value Date   TROPONINI 0.48 ([place]) 07/10/2017   No results found for: "DDIMER" Does the Patient currently have chest pain? No    R Recommendations: See Admitting Provider Note  Report given to:   Additional Notes:  Pt has nonocclusive clot in the SMA. Pt is on a heparin drip, level came back high but was drawn on same side has heparin infusion, I have recollected and sent down to be re-ran and pharmacy is watching for level, just an FYI. Otherwise pt is fine no signs of bleeding anywhere. PT is mostly complaining of right hip pain, but has PRN meds for that.

## 2023-02-06 ENCOUNTER — Encounter (HOSPITAL_COMMUNITY): Payer: Self-pay | Admitting: Surgery

## 2023-02-06 DIAGNOSIS — I70221 Atherosclerosis of native arteries of extremities with rest pain, right leg: Secondary | ICD-10-CM | POA: Diagnosis not present

## 2023-02-06 LAB — COMPREHENSIVE METABOLIC PANEL
ALT: 16 U/L (ref 0–44)
AST: 16 U/L (ref 15–41)
Albumin: 2.8 g/dL — ABNORMAL LOW (ref 3.5–5.0)
Alkaline Phosphatase: 59 U/L (ref 38–126)
Anion gap: 9 (ref 5–15)
BUN: 15 mg/dL (ref 8–23)
CO2: 26 mmol/L (ref 22–32)
Calcium: 8.8 mg/dL — ABNORMAL LOW (ref 8.9–10.3)
Chloride: 104 mmol/L (ref 98–111)
Creatinine, Ser: 1.02 mg/dL — ABNORMAL HIGH (ref 0.44–1.00)
GFR, Estimated: >60 mL/min (ref >60)
Glucose, Bld: 208 mg/dL — ABNORMAL HIGH (ref 70–99)
Potassium: 4.7 mmol/L (ref 3.5–5.1)
Sodium: 139 mmol/L (ref 135–145)
Total Bilirubin: 0.5 mg/dL (ref 0.3–1.2)
Total Protein: 6.5 g/dL (ref 6.5–8.1)

## 2023-02-06 LAB — CBC WITH DIFFERENTIAL/PLATELET
Abs Immature Granulocytes: 0.03 10*3/uL (ref 0.00–0.07)
Basophils Absolute: 0 10*3/uL (ref 0.0–0.1)
Basophils Relative: 0 %
Eosinophils Absolute: 0 10*3/uL (ref 0.0–0.5)
Eosinophils Relative: 0 %
HCT: 36.2 % (ref 36.0–46.0)
Hemoglobin: 12.4 g/dL (ref 12.0–15.0)
Immature Granulocytes: 0 %
Lymphocytes Relative: 11 %
Lymphs Abs: 1.1 10*3/uL (ref 0.7–4.0)
MCH: 29.3 pg (ref 26.0–34.0)
MCHC: 34.3 g/dL (ref 30.0–36.0)
MCV: 85.6 fL (ref 80.0–100.0)
Monocytes Absolute: 0.5 10*3/uL (ref 0.1–1.0)
Monocytes Relative: 5 %
Neutro Abs: 8.3 10*3/uL — ABNORMAL HIGH (ref 1.7–7.7)
Neutrophils Relative %: 84 %
Platelets: 153 10*3/uL (ref 150–400)
RBC: 4.23 MIL/uL (ref 3.87–5.11)
RDW: 13.7 % (ref 11.5–15.5)
WBC: 10 10*3/uL (ref 4.0–10.5)
nRBC: 0 % (ref 0.0–0.2)

## 2023-02-06 LAB — LIPID PANEL
Cholesterol: 165 mg/dL (ref 0–200)
HDL: 34 mg/dL — ABNORMAL LOW (ref >40)
LDL Cholesterol: 107 mg/dL — ABNORMAL HIGH (ref 0–99)
Total CHOL/HDL Ratio: 4.9 RATIO
Triglycerides: 118 mg/dL (ref <150)
VLDL: 24 mg/dL (ref 0–40)

## 2023-02-06 LAB — GLUCOSE, CAPILLARY
Glucose-Capillary: 206 mg/dL — ABNORMAL HIGH (ref 70–99)
Glucose-Capillary: 193 mg/dL — ABNORMAL HIGH (ref 70–99)
Glucose-Capillary: 159 mg/dL — ABNORMAL HIGH (ref 70–99)

## 2023-02-06 LAB — MAGNESIUM: Magnesium: 1.6 mg/dL — ABNORMAL LOW (ref 1.7–2.4)

## 2023-02-06 LAB — PHOSPHORUS: Phosphorus: 2.8 mg/dL (ref 2.5–4.6)

## 2023-02-06 MED ORDER — TICAGRELOR 90 MG PO TABS: 90.0000 mg | ORAL_TABLET | Freq: Two times a day (BID) | ORAL | Status: DC

## 2023-02-06 MED ORDER — TICAGRELOR 90 MG PO TABS: 90.0000 mg | ORAL_TABLET | Freq: Two times a day (BID) | ORAL | 2 refills | Status: DC

## 2023-02-06 NOTE — Progress Notes (Addendum)
  Progress Note    02/06/2023 9:12 AM 1 Day Post-Op  Subjective:  R foot feels much better compared to before surgery   Vitals:   02/06/23 0600 02/06/23 0700  BP: (!) 119/50 110/83  Pulse: 60 67  Resp: 10 (!) 9  Temp:  97.7 F (36.5 C)  SpO2: 96% 97%   Physical Exam: Lungs:  non labored Incisions:  groin cath sites without hematoma Extremities:  palpable R PT pulse; motor and sensation intact R foot Neurologic: A&O  CBC    Component Value Date/Time   WBC 10.0 02/06/2023 0126   RBC 4.23 02/06/2023 0126   HGB 12.4 02/06/2023 0126   HGB 13.8 07/07/2022 1531   HCT 36.2 02/06/2023 0126   HCT 41.8 07/07/2022 1531   PLT 153 02/06/2023 0126   PLT 192 07/07/2022 1531   MCV 85.6 02/06/2023 0126   MCV 86 07/07/2022 1531   MCH 29.3 02/06/2023 0126   MCHC 34.3 02/06/2023 0126   RDW 13.7 02/06/2023 0126   RDW 14.2 07/07/2022 1531   LYMPHSABS 1.1 02/06/2023 0126   MONOABS 0.5 02/06/2023 0126   EOSABS 0.0 02/06/2023 0126   BASOSABS 0.0 02/06/2023 0126    BMET    Component Value Date/Time   NA 139 02/06/2023 0126   NA 142 07/07/2022 1531   K 4.7 02/06/2023 0126   CL 104 02/06/2023 0126   CO2 26 02/06/2023 0126   GLUCOSE 208 (H) 02/06/2023 0126   BUN 15 02/06/2023 0126   BUN 8 07/07/2022 1531   CREATININE 1.02 (H) 02/06/2023 0126   CALCIUM 8.8 (L) 02/06/2023 0126   GFRNONAA >60 02/06/2023 0126   GFRAA 53 (L) 06/09/2020 0331    INR    Component Value Date/Time   INR 0.93 07/10/2017 1106     Intake/Output Summary (Last 24 hours) at 02/06/2023 0912 Last data filed at 02/06/2023 0745 Gross per 24 hour  Intake 2647.17 ml  Output 3540 ml  Net -892.83 ml     Assessment/Plan:  62 y.o. female is s/p mechanical thrombectomy and stenting of R common iliac artery 1 Day Post-Op   R foot well perfused with palpable PT pulse and motor and sensation intact B groin cath sites are well appearing without hematoma Continue aspirin and statin, we will switch from plavix to  [place] as long as this is affordable Ok for discharge home from vascular standpoint; office will arrange AOI duplex and ABI in 4-6 weeks   Dagoberto Ligas, PA-C [facility] [phone] 02/06/2023 9:12 AM  I agree with the above.  I have seen and evaluated the patient.  Subjectively, her right leg is much improved.  She does continue to have some discoloration of her toes.  Her sensation is improved.  She has a palpable posterior tibial pulse.  I plan on changing her Plavix to Brilinta in addition to aspirin.  She is stable for discharge from a vascular standpoint.  We will arrange outpatient follow-up.  She is concerned about the mesenteric pathology seen on CT scan.  She does have abdominal pain but she does not strike me as having mesenteric angina or ischemia.  I will get a mesenteric duplex at follow-up.  [name]

## 2023-02-06 NOTE — Progress Notes (Signed)
PHARMACIST LIPID MONITORING   Alice Bennett is a 62 y.o. female admitted on 02/04/2023 with critical limb ischemia.  Pharmacy has been consulted to optimize lipid-lowering therapy with the indication of secondary prevention for clinical ASCVD.  Recent Labs:  Lipid Panel (last 6 months):   Lab Results  Component Value Date   CHOL 165 02/06/2023   TRIG 118 02/06/2023   HDL 34 (L) 02/06/2023   CHOLHDL 4.9 02/06/2023   VLDL 24 02/06/2023   LDLCALC 107 (H) 02/06/2023    Hepatic function panel (last 6 months):   Lab Results  Component Value Date   AST 16 02/06/2023   ALT 16 02/06/2023   ALKPHOS 59 02/06/2023   BILITOT 0.5 02/06/2023    SCr (since admission):   Serum creatinine: 1.02 mg/dL (H) 02/06/23 0126 Estimated creatinine clearance: 73.5 mL/min (A)  Current therapy and lipid therapy tolerance Current lipid-lowering therapy: atorvastatin 80mg  daily, ezetimibe 10mg  daily, vascepa 1g BID  Previous lipid-lowering therapies (if applicable): pravastatin 40mg  daily  Documented or reported allergies or intolerances to lipid-lowering therapies (if applicable): none  Assessment:   Patient prefers no changes in lipid-lowering therapy at this time as she is near her goal of <100 and is on three lipid-lowering therapies. Patient reports that her insurance does not cover PCSK9 inhibitors. She will follow-up with cardiology on 4/15 and 6/26 and discuss her lipid results during these appointments.   Plan:    1.Statin intensity (high intensity recommended for all patients regardless of the LDL):  No statin changes. The patient is already on a high intensity statin.  2.Add ezetimibe (if any one of the following): The patient is already on ezetimibe.   3.Refer to lipid clinic:   No  4.Follow-up with:  Cardiology - Shelva Majestic, MD  5.Follow-up labs after discharge:  No changes in lipid therapy, repeat a lipid panel in one year.      Billey Gosling, PharmD PGY1 Pharmacy  Resident 3/31/20247:13 AM

## 2023-02-06 NOTE — Discharge Instructions (Signed)
° °  [facility] ° °Discharge Instructions ° °Lower Extremity Angiogram; Angioplasty/Stenting ° °Please refer to the following instructions for your post-procedure care. Your surgeon or physician assistant will discuss any changes with you. ° °Activity ° °Avoid lifting more than 8 pounds (1 gallons of milk) for 72 hours (3 days) after your procedure. You may walk as much as you can tolerate. It's OK to drive after 72 hours. ° °Bathing/Showering ° °You may shower the day after your procedure. If you have a bandage, you may remove it at 24- 48 hours. Clean your incision site with mild soap and water. Pat the area dry with a clean towel. ° °Diet ° °Resume your pre-procedure diet. There are no special food restrictions following this procedure. All patients with peripheral vascular disease should follow a low fat/low cholesterol diet. In order to heal from your surgery, it is CRITICAL to get adequate nutrition. Your body requires vitamins, minerals, and protein. Vegetables are the best source of vitamins and minerals. Vegetables also provide the perfect balance of protein. Processed food has little nutritional value, so try to avoid this. ° °Medications ° °Resume taking all of your medications unless your doctor tells you not to. If your incision is causing pain, you may take over-the-counter pain relievers such as acetaminophen (Tylenol) ° °Follow Up ° °Follow up will be arranged at the time of your procedure. You may have an office visit scheduled or may be scheduled for surgery. Ask your surgeon if you have any questions. ° °Please call us immediately for any of the following conditions: °•Severe or worsening pain your legs or feet at rest or with walking. °•Increased pain, redness, drainage at your groin puncture site. °•Fever of 101 degrees or higher. °•If you have any mild or slow bleeding from your puncture site: lie down, apply firm constant pressure over the area with a piece of  gauze or a clean wash cloth for 30 minutes- no peeking!, call 911 right away if you are still bleeding after 30 minutes, or if the bleeding is heavy and unmanageable. ° °Reduce your risk factors of vascular disease: ° °Stop smoking. If you would like help call [facility] at 1-800-QUIT-NOW (1-800-784-8669) or [facility] at [phone]. °Manage your cholesterol °Maintain a desired weight °Control your diabetes °Keep your blood pressure down ° °If you have any questions, please call the office at [phone] ° °

## 2023-02-06 NOTE — TOC Transition Note (Signed)
Transition of Care [facility]) - CM/SW Discharge Note   Patient Details  Name: Alice Bennett MRN: [id number] Date of Birth: Feb 13, 1961  Transition of Care [facility]) CM/SW Contact:  Carles Collet, RN Phone Number: 02/06/2023, 11:42 AM   Clinical Narrative:      Provided patient with 30 day Brilinta coupon       Patient Goals and CMS Choice      Discharge Placement                         Discharge Plan and Services Additional resources added to the After Visit Summary for                                       Social Determinants of Health (SDOH) Interventions SDOH Screenings   Food Insecurity: No Food Insecurity (07/27/2022)  Housing: Low Risk  (07/27/2022)  Transportation Needs: No Transportation Needs (07/27/2022)  Utilities: Not At Risk (07/27/2022)  Tobacco Use: High Risk (02/06/2023)     Readmission Risk Interventions     No data to display

## 2023-02-06 NOTE — Discharge Summary (Signed)
Physician Discharge Summary   Patient: Alice Bennett MRN: [id number] DOB: 1961/06/07  Admit date:     02/04/2023  Discharge date: 02/06/23  Discharge Physician: Emilee Hero   PCP: Pcp, No   Recommendations at discharge:   Continue aspirin, statin Switch Plavix to Brilinta, patient to obtain free 30 days due to inability to provide insurance estimate Vascular surgery to arrange duplex, ABI in 4 to 6 weeks  Discharge Diagnoses: Principal Problem:   Critical limb ischemia of right lower extremity ([place]) Active Problems:   Superior mesenteric artery stenosis ([place])   Ischemic cardiomyopathy   Hyperlipidemia   Hypertension   Peripheral arterial disease ([place])  Resolved Problems:   * No resolved hospital problems. *  Hospital Course: Alice Bennett is a 62 y.o. female with medical history significant of CAD, ICM with EF 50-55%, DM2, HTN, HLD, PAD s/p R iliac stent by Dr. Gwenlyn Found in September 2023, who presented to the ED with 5-day history of right foot pain and discoloration.  Workup in the ED revealed critical limb ischemia of right lower extremity for which vascular surgery was consulted.  Status post aortogram with runoff, right percutaneous mechanical thrombectomy on 02/05/2023.   Assessment and Plan: * Critical limb ischemia of right lower extremity (HCC) Thrombus with occlusion / partial occlusion of R iliac stent based on CTA. Patient underwent abdominal aortogram with percutaneous mechanical thrombectomy.  To be placed on aspirin, Brilinta, statin postoperatively.  Patient did well after the procedure and her right foot was well-perfused with palpable pulses.  Motor and sensation was intact  Superior mesenteric artery stenosis (HCC) SMA thrombus seen on CTA. However seems to be asymptomatic.  Pt with no abd pain, eating food at time of my evaluation in ED, etc. Management per vascular surgery who is already seeing pt for the RLE ischemia. Already on ASA, plavix as noted  above.  Hypertension Cont coreg, aldactone, and irbesartan.  Hyperlipidemia Cont statin, zetia  Ischemic cardiomyopathy EF 50-55% Cont ASA, coreg, plavix, ARB, aldactone        Consultants: vasc surg Procedures performed: thrombectomy  Disposition: Home Diet recommendation:  Discharge Diet Orders (From admission, onward)     Start     Ordered   02/06/23 0000  Diet - low sodium heart healthy        02/06/23 1120           Regular diet DISCHARGE MEDICATION: Allergies as of 02/06/2023       Reactions   Codeine Itching        Medication List     STOP taking these medications    clopidogrel 75 MG tablet Commonly known as: PLAVIX       TAKE these medications    aspirin EC 81 MG tablet Take 81 mg by mouth daily.   atorvastatin 80 MG tablet Commonly known as: LIPITOR Take 1 tablet (80 mg total) by mouth daily at 6 PM.   carvedilol 6.25 MG tablet Commonly known as: COREG Take 1 tablet (6.25 mg total) by mouth 2 (two) times daily with a meal.   clonazePAM 1 MG tablet Commonly known as: KLONOPIN Take 1 mg by mouth 2 (two) times daily.   D3 High Potency 125 MCG (5000 UT) capsule Generic drug: Cholecalciferol Take 5,000 Units by mouth daily.   ezetimibe 10 MG tablet Commonly known as: ZETIA Take 1 tablet (10 mg total) by mouth daily.   Farxiga 10 MG Tabs tablet Generic drug: dapagliflozin propanediol Take 10 mg by  mouth daily.   nitroGLYCERIN 0.4 MG SL tablet Commonly known as: NITROSTAT Place 0.4 mg under the tongue as needed for chest pain.   olmesartan 20 MG tablet Commonly known as: BENICAR Take 1 tablet (20 mg total) by mouth daily.   Ozempic (2 MG/DOSE) 8 MG/3ML Sopn Generic drug: Semaglutide (2 MG/DOSE) Inject 2 mg into the skin once a week.   pantoprazole 40 MG tablet Commonly known as: PROTONIX Take 1 tablet (40 mg total) by mouth daily.   pregabalin 150 MG capsule Commonly known as: LYRICA Take 150 mg by mouth 2 (two)  times daily.   sertraline 100 MG tablet Commonly known as: ZOLOFT Take 100 mg by mouth daily.   spironolactone 25 MG tablet Commonly known as: ALDACTONE Take 1 tablet (25 mg total) by mouth daily.   ticagrelor 90 MG Tabs tablet Commonly known as: BRILINTA Take 1 tablet (90 mg total) by mouth 2 (two) times daily.   Vascepa 1 g capsule Generic drug: icosapent Ethyl Take 1 capsule (1 g total) by mouth 2 (two) times daily.        Follow-up Information     [facility] Follow up in 5 week(s).   Specialty: Vascular Surgery Contact information: [place] [phone]               Discharge Exam: Danley Danker Weights   02/04/23 1336 02/05/23 0823  Weight: 101.6 kg 101.6 kg   Physical Exam Constitutional:      Appearance: She is normal weight.  HENT:     Nose: Nose normal.     Mouth/Throat:     Mouth: Mucous membranes are moist.     Pharynx: Oropharynx is clear.  Cardiovascular:     Rate and Rhythm: Normal rate.  Pulmonary:     Effort: Pulmonary effort is normal.  Abdominal:     General: Abdomen is flat.  Musculoskeletal:     Cervical back: Normal range of motion.  Skin:    General: Skin is warm.     Capillary Refill: Capillary refill takes less than 2 seconds.  Neurological:     Mental Status: She is alert.  Psychiatric:        Mood and Affect: Mood normal.      Condition at discharge: good  The results of significant diagnostics from this hospitalization (including imaging, microbiology, ancillary and laboratory) are listed below for reference.   Imaging Studies: HYBRID OR IMAGING ([facility] ONLY)  Result Date: 02/05/2023 There is no interpretation for this exam.  This order is for images obtained during a surgical procedure.  Please See "Surgeries" Tab for more information regarding the procedure.   HYBRID OR IMAGING ([facility] ONLY)  Result Date: 02/05/2023 There is no  interpretation for this exam.  This order is for images obtained during a surgical procedure.  Please See "Surgeries" Tab for more information regarding the procedure.   CT ANGIO LOWER EXT BILAT W &/OR WO CONTRAST  Result Date: 02/04/2023 CLINICAL DATA:  Bilateral lower extremity claudication/ischemia. EXAM: CT ANGIOGRAPHY OF ABDOMINAL AORTA WITH ILIOFEMORAL RUNOFF TECHNIQUE: Multidetector CT imaging of the abdomen, pelvis and lower extremities was performed using the standard protocol during bolus administration of intravenous contrast. Multiplanar CT image reconstructions and MIPs were obtained to evaluate the vascular anatomy. RADIATION DOSE REDUCTION: This exam was performed according to the departmental dose-optimization program which includes automated exposure control, adjustment of the mA and/or kV according to patient size and/or  use of iterative reconstruction technique. CONTRAST:  125mL OMNIPAQUE IOHEXOL 350 MG/ML SOLN COMPARISON:  None Available. FINDINGS: Evaluation is limited due to suboptimal opacification of the vessels. VASCULAR Aorta: Moderate atherosclerotic calcification of the distal aorta. No aneurysmal dilatation or dissection. No periaortic fluid collection. Celiac: The celiac artery appears patent for the degree of opacification. SMA: There is nonocclusive clot in the proximal 17 mm segment of the SMA with partial luminal narrowing and opacification. The SMA remains patent distal to this segment. Evaluation is limited due to suboptimal vascular opacification. Renals: The renal arteries are suboptimally opacified but appear patent. IMA: The IMA is suboptimally opacified and not well evaluated. RIGHT Lower Extremity Inflow: Right common iliac artery stent suboptimally evaluated due to poor vascular opacification. There is apparent hypodensity in the distal lumen of the stent. A partial occlusion of the distal half of the stent is not excluded. The right external iliac artery and right  internal iliac artery appear patent for the degree of opacification. Outflow: There is mild atherosclerotic calcification of the superficial femoral artery. The common femoral artery, superficial and deep femoral arteries, and the popliteal artery are patent. There is overall decreased in the size of the arterial lumen. Runoff: Evaluation of the calf arteries is very limited due to timing of the contrast. The posterior tibial artery appears patent. The proximal and midportion of the fibular artery as well as the proximal anterior tibial artery appear patent. Evaluation of the distal portion of the anterior tibial artery is very limited on this exam. LEFT Lower Extremity Inflow: Moderate atherosclerotic calcification of the left common iliac artery with associated luminal narrowing. The left common iliac artery and the external and internal iliac arteries appear patent. Outflow: Mild atherosclerotic calcification of the superficial femoral artery. The common femoral artery, deep and superficial femoral arteries, and the popliteal artery appear patent. There is overall decrease luminal size of the arteries. Runoff: Evaluation of the calf arteries is very limited on this exam. The posterior tibial and fibular arteries appear patent. The anterior tibial artery is poorly visualized and suboptimally evaluated. Veins: No obvious venous abnormality within the limitations of this arterial phase study. Review of the MIP images confirms the above findings. NON-VASCULAR Lower chest: The visualized lung bases are clear. No intra-abdominal free air or free fluid. Hepatobiliary: No focal liver abnormality is seen. No gallstones, gallbladder wall thickening, or biliary dilatation. Pancreas: Unremarkable. No pancreatic ductal dilatation or surrounding inflammatory changes. Spleen: Normal in size without focal abnormality. Adrenals/Urinary Tract: The right adrenal gland is unremarkable. A 2 cm left adrenal nodule similar to the CT of  2020 and previously characterized as adenoma. No imaging follow-up. There is a 3 mm nonobstructing right renal inferior pole and a punctate left renal inter pole calculi. There is no hydronephrosis on either side. The visualized ureters and urinary bladder appear unremarkable. Stomach/Bowel: There is no bowel obstruction or active inflammation. The appendix is normal. Lymphatic: No adenopathy. Reproductive: The uterus and ovaries are grossly unremarkable. No adnexal masses. Other: None Musculoskeletal: No acute osseous pathology. IMPRESSION: 1. Limited evaluation due to suboptimal opacification of the vessels. 2. Nonocclusive clot in the proximal 17 mm segment of the SMA with partial luminal narrowing. The SMA remains patent distal to this segment. 3. Artifact versus possible partial occlusion of the distal right common iliac artery stent. The right internal and external iliac arteries appear patent for the degree of opacification. 4. Patent appearance of the posterior tibial and fibular arteries bilaterally. The anterior tibial  arteries are suboptimally evaluated. 5. No bowel obstruction. Normal appendix. 6. Small nonobstructing bilateral renal calculi. No hydronephrosis. Electronically Signed   By: Anner Crete M.D.   On: 02/04/2023 22:03    Microbiology: Results for orders placed or performed during the hospital encounter of 02/04/23  Surgical pcr screen     Status: None   Collection Time: 02/05/23  9:41 AM   Specimen: Nasal Mucosa; Nasal Swab  Result Value Ref Range Status   MRSA, PCR NEGATIVE NEGATIVE Final   Staphylococcus aureus NEGATIVE NEGATIVE Final    Comment: (NOTE) The Xpert SA Assay (FDA approved for NASAL specimens in patients 89 years of age and older), is one component of a comprehensive surveillance program. It is not intended to diagnose infection nor to guide or monitor treatment. Performed at [facility], [place] [address], [place], [place] [address]      Labs: CBC: Recent Labs  Lab 02/04/23 1652 02/05/23 0106 02/06/23 0126  WBC 5.3 5.4 10.0  NEUTROABS 2.9  --  8.3*  HGB 14.6 12.2 12.4  HCT 43.4 37.8 36.2  MCV 87.0 88.9 85.6  PLT 164 149* 0000000   Basic Metabolic Panel: Recent Labs  Lab 02/04/23 1652 02/05/23 0106 02/06/23 0126  NA 136 137 139  K 3.8 3.4* 4.7  CL 102 104 104  CO2 24 26 26   GLUCOSE 112* 194* 208*  BUN 12 12 15   CREATININE 1.14* 1.06* 1.02*  CALCIUM 8.7* 8.2* 8.8*  MG  --   --  1.6*  PHOS  --   --  2.8   Liver Function Tests: Recent Labs  Lab 02/06/23 0126  AST 16  ALT 16  ALKPHOS 59  BILITOT 0.5  PROT 6.5  ALBUMIN 2.8*   CBG: Recent Labs  Lab 02/05/23 1543 02/05/23 1952 02/05/23 2341 02/06/23 0413 02/06/23 0700  GLUCAP 165* 203* 210* 206* 193*    Discharge time spent: greater than 30 minutes.  Signed: Emilee Hero, MD [facility] 02/06/2023

## 2023-02-06 NOTE — Plan of Care (Signed)
  Problem: Education: Goal: Ability to describe self-care measures that may prevent or decrease complications (Diabetes Survival Skills Education) will improve Outcome: Adequate for Discharge Goal: Individualized Educational Video(s) Outcome: Adequate for Discharge   Problem: Coping: Goal: Ability to adjust to condition or change in health will improve Outcome: Adequate for Discharge   Problem: Nutritional: Goal: Maintenance of adequate nutrition will improve Outcome: Adequate for Discharge Goal: Progress toward achieving an optimal weight will improve Outcome: Adequate for Discharge   Problem: Skin Integrity: Goal: Risk for impaired skin integrity will decrease Outcome: Adequate for Discharge

## 2023-02-07 LAB — POCT ACTIVATED CLOTTING TIME
Activated Clotting Time: 271 seconds
Activated Clotting Time: 255 seconds

## 2023-02-07 LAB — HEMOGLOBIN A1C
Hgb A1c MFr Bld: 6.7 % — ABNORMAL HIGH (ref 4.8–5.6)
Mean Plasma Glucose: 146 mg/dL

## 2023-02-10 ENCOUNTER — Telehealth: Payer: Self-pay | Admitting: Surgery

## 2023-02-10 ENCOUNTER — Ambulatory Visit: Payer: Medicaid Other | Admitting: Nurse Practitioner

## 2023-02-10 NOTE — Telephone Encounter (Signed)
Appt has been scheduled.

## 2023-02-10 NOTE — Telephone Encounter (Signed)
-----   Message from Serafina Mitchell, MD sent at 02/06/2023  9:33 AM EDT ----- 02/06/2023: Follow-up hospital visit  Schedule the patient to see me in the office in 1 month with aortoiliac arterial duplex and ABIs.  She will also need a mesenteric duplex

## 2023-02-21 ENCOUNTER — Ambulatory Visit (HOSPITAL_COMMUNITY): Payer: Medicaid Other

## 2023-02-25 ENCOUNTER — Other Ambulatory Visit: Payer: Self-pay | Admitting: Family

## 2023-02-25 DIAGNOSIS — Z1231 Encounter for screening mammogram for malignant neoplasm of breast: Secondary | ICD-10-CM

## 2023-03-01 ENCOUNTER — Encounter: Payer: Self-pay | Admitting: *Deleted

## 2023-03-01 DIAGNOSIS — Z006 Encounter for examination for normal comparison and control in clinical research program: Secondary | ICD-10-CM

## 2023-03-01 NOTE — Research (Signed)
36 WEEK Follow-Up Visit Completed*    Not Necessary, No Potential Adverse Events Or Medication Issues Reported On Completed Subject Questionnaire    Yes, Contact With Subject/Alternate Contact Completed    Yes, No Contact With Subject/Alternate Contact Completed, But Electronic Health Record Was Reviewed    No, Unable To Contact Subject/Alternate Contact   Have you reviewed Ongoing medications on the Targeted Concomitant Medication form and updated the form as needed?    Yes    No   Subject Status*    Continuing In Follow-up    At Risk For Lost To Follow-up    Withdrawal From All Future Study Activities Including Passive Follow-up By Electronic Health Record Review Or Contact With Healthcare Provider Or Family Member/Friend    Death   Vital Status*    Alive    Deceased    Unknown   Last Known To Be Alive Source*    Subject Completed Follow-up Questionnaire/Seen In Person/Via Telephone Contact    Family Member or Caretaker    Primary Physician Or Medical Records    Publicly Available Source    Other  Date of last dose taken   DAY/MONTH/YEAR NA  Over the last 12 weeks did the subject miss any doses? NA  Over the last 12 weeks did the subject restart Evolocumab after an interruption? NA

## 2023-03-14 ENCOUNTER — Other Ambulatory Visit: Payer: Self-pay | Admitting: *Deleted

## 2023-03-14 DIAGNOSIS — I739 Peripheral vascular disease, unspecified: Secondary | ICD-10-CM

## 2023-03-14 DIAGNOSIS — K551 Chronic vascular disorders of intestine: Secondary | ICD-10-CM

## 2023-03-21 ENCOUNTER — Encounter: Payer: Self-pay | Admitting: Surgery

## 2023-03-21 ENCOUNTER — Ambulatory Visit (INDEPENDENT_AMBULATORY_CARE_PROVIDER_SITE_OTHER)
Admission: RE | Admit: 2023-03-21 | Discharge: 2023-03-21 | Disposition: A | Discharge status: Home / Self Care | Payer: Medicaid Other | Source: Ambulatory Visit | Attending: Vascular Surgery | Admitting: Vascular Surgery

## 2023-03-21 ENCOUNTER — Ambulatory Visit (HOSPITAL_COMMUNITY)
Admission: RE | Admit: 2023-03-21 | Discharge: 2023-03-21 | Disposition: A | Discharge status: Home / Self Care | Payer: Medicaid Other | Source: Ambulatory Visit | Attending: Vascular Surgery | Admitting: Vascular Surgery

## 2023-03-21 ENCOUNTER — Ambulatory Visit (INDEPENDENT_AMBULATORY_CARE_PROVIDER_SITE_OTHER): Payer: Medicaid Other | Admitting: Surgery

## 2023-03-21 VITALS — BP 141/85 | HR 70 | Temp 97.8°F | Resp 20 | Ht 69.0 in | Wt 225.0 lb

## 2023-03-21 DIAGNOSIS — K551 Chronic vascular disorders of intestine: Secondary | ICD-10-CM | POA: Diagnosis present

## 2023-03-21 DIAGNOSIS — I70213 Atherosclerosis of native arteries of extremities with intermittent claudication, bilateral legs: Secondary | ICD-10-CM | POA: Diagnosis not present

## 2023-03-21 DIAGNOSIS — I739 Peripheral vascular disease, unspecified: Secondary | ICD-10-CM | POA: Insufficient documentation

## 2023-03-21 LAB — VAS US ABI WITH/WO TBI
Left ABI: 0.97
Right ABI: 1.03

## 2023-03-21 NOTE — Progress Notes (Signed)
[facility] [place]  Patient name: Alice Bennett MRN: [id number] DOB: 09-Feb-1961 Sex: female   REASON FOR VISIT:    Follow up  HISOTRY OF PRESENT ILLNESS:    Alice Bennett is a 62 y.o. female who presented to the emergency department on 02/04/2023 with coolness and discoloration in her right foot and toes that been present for approximately 5 days.  She had difficulty walking secondary to pain.  She was having buttock and thigh claudication.  She has a history of right common iliac stent placement by Dr. Allyson Sabal in 2023.  On exam, she did not have a palpable femoral pulse and she had monophasic signals in her pedal vessels.  I took her to the operating room on 02/05/2023 and perform angiography which confirmed occlusion of her right common iliac stent.  I then proceeded with mechanical thrombectomy using a penumbra CAT 7 device.  Once the vessel was open, I felt that her previous stents were undersized and so I placed 8 mm VBX stents.  The next day she had palpable pedal pulses.  I changed her to Brilinta from Plavix in addition to her aspirin.  The patient has a history of coronary artery disease, status post PCI in 2014 and 2018.  She last saw Dr. Tresa Endo in December 2023.  She is on dual antiplatelet therapy with aspirin and Plavix.  She takes a statin for hypercholesterolemia.  She is medically managed for hypertension with an ARB.  She is a diabetic.  PAST MEDICAL HISTORY:   Past Medical History:  Diagnosis Date   CAD (coronary artery disease)    07/10/17 PCI/DES to LAD, EF 35-40%   Diabetes mellitus without complication (HCC)    Hyperlipidemia    Hypertension    NSTEMI (non-ST elevated myocardial infarction) (HCC)      FAMILY HISTORY:   History reviewed. No pertinent family history.  SOCIAL HISTORY:   Social History   Tobacco Use   Smoking status: Every Day    Packs/day: 0.50    Years: 10.00    Additional pack  years: 0.00    Total pack years: 5.00    Types: Cigarettes   Smokeless tobacco: Never  Substance Use Topics   Alcohol use: No     ALLERGIES:   Allergies  Allergen Reactions   Codeine Itching     CURRENT MEDICATIONS:   Current Outpatient Medications  Medication Sig Dispense Refill   aspirin EC 81 MG tablet Take 81 mg by mouth daily.     atorvastatin (LIPITOR) 80 MG tablet Take 1 tablet (80 mg total) by mouth daily at 6 PM. 30 tablet 0   carvedilol (COREG) 6.25 MG tablet Take 1 tablet (6.25 mg total) by mouth 2 (two) times daily with a meal. 60 tablet 0   Cholecalciferol (D3 HIGH POTENCY) 125 MCG (5000 UT) capsule Take 5,000 Units by mouth daily.     clonazePAM (KLONOPIN) 1 MG tablet Take 1 mg by mouth 2 (two) times daily.     ezetimibe (ZETIA) 10 MG tablet Take 1 tablet (10 mg total) by mouth daily. 90 tablet 3   FARXIGA 10 MG TABS tablet Take 10 mg by mouth daily.      nitroGLYCERIN (NITROSTAT) 0.4 MG SL tablet Place 0.4 mg under the tongue as needed for chest pain.     olmesartan (BENICAR) 20 MG tablet Take 1 tablet (20 mg total) by mouth daily. 90 tablet 3   OZEMPIC, 2 MG/DOSE, 8 MG/3ML SOPN  Inject 2 mg into the skin once a week.     pantoprazole (PROTONIX) 40 MG tablet Take 1 tablet (40 mg total) by mouth daily. 30 tablet 0   pregabalin (LYRICA) 150 MG capsule Take 150 mg by mouth 2 (two) times daily.     sertraline (ZOLOFT) 100 MG tablet Take 100 mg by mouth daily.     ticagrelor (BRILINTA) 90 MG TABS tablet Take 1 tablet (90 mg total) by mouth 2 (two) times daily. 60 tablet 2   VASCEPA 1 g capsule Take 1 capsule (1 g total) by mouth 2 (two) times daily. 180 capsule 3   spironolactone (ALDACTONE) 25 MG tablet Take 1 tablet (25 mg total) by mouth daily. 90 tablet 3   No current facility-administered medications for this visit.    REVIEW OF SYSTEMS:   [X]  denotes positive finding, [ ]  denotes negative finding Cardiac  Comments:  Chest pain or chest pressure:     Shortness of breath upon exertion:    Short of breath when lying flat:    Irregular heart rhythm:        Vascular    Pain in calf, thigh, or hip brought on by ambulation:    Pain in feet at night that wakes you up from your sleep:     Blood clot in your veins:    Leg swelling:         Pulmonary    Oxygen at home:    Productive cough:     Wheezing:         Neurologic    Sudden weakness in arms or legs:     Sudden numbness in arms or legs:     Sudden onset of difficulty speaking or slurred speech:    Temporary loss of vision in one eye:     Problems with dizziness:         Gastrointestinal    Blood in stool:     Vomited blood:         Genitourinary    Burning when urinating:     Blood in urine:        Psychiatric    Major depression:         Hematologic    Bleeding problems:    Problems with blood clotting too easily:        Skin    Rashes or ulcers:        Constitutional    Fever or chills:      PHYSICAL EXAM:   Vitals:   03/21/23 1010  BP: (!) 141/85  Pulse: 70  Resp: 20  Temp: 97.8 F (36.6 C)  SpO2: 96%  Weight: 225 lb (102.1 kg)  Height: 5\' 9"  (1.753 m)    GENERAL: The patient is a well-nourished female, in no acute distress. The vital signs are documented above. CARDIAC: There is a regular rate and rhythm.  VASCULAR: Palpable dorsalis pedis pulse.  Palpable radial pulses bilaterally PULMONARY: Non-labored respirations MUSCULOSKELETAL: There are no major deformities or cyanosis. NEUROLOGIC: No focal weakness or paresthesias are detected. SKIN: There are no ulcers or rashes noted. PSYCHIATRIC: The patient has a normal affect.  STUDIES:   I have reviewed the following: Stenosis:  Patent right common iliac artery stent without evidence of hemodynamically  significant stenosis.   Patent SMA without evidece of hemodynamically significant stenosis. Unable  to visualize celiac axis and IMA secondary to technical limitations.     +-------+-----------+-----------+------------+------------+  ABI/TBIToday's ABIToday's TBIPrevious ABIPrevious TBI  +-------+-----------+-----------+------------+------------+  Right 1.03       0.87       1.04        0.62          +-------+-----------+-----------+------------+------------+  Left  0.97       0.75       1.09        0.70          +-------+-----------+-----------+------------+------------+   Right toe pressure: 137 Left toe pressure: 119 Waveforms are biphasic  MEDICAL ISSUES:    Lower extremity atherosclerotic vascular disease: She has a faintly palpable dorsalis pedis pulse today.  ABIs are within normal limits and her stent is widely patent.  She continues to have some pain and numbness in her fourth toe.  This is likely secondary to nerve damage from her 1 week ischemic time.  Hopefully this will resolve with time.  I have switched her to Brilinta from Plavix.  I want to keep a close watch on her stents since she had early occlusion.  She will follow-up in 3 months with repeat iliac duplex  Mesenteric: There was a possible thrombus on CT scan when she was in the hospital.  Ultrasound today shows that there is a widely patent SMA with no evidence of stenosis or thrombus    Charlena Cross, MD, FACS [facility] [phone] Pager [phone]

## 2023-03-25 ENCOUNTER — Ambulatory Visit
Admission: RE | Admit: 2023-03-25 | Discharge: 2023-03-25 | Disposition: A | Discharge status: Home / Self Care | Payer: Medicaid Other | Source: Ambulatory Visit | Attending: Family | Admitting: Family

## 2023-03-25 DIAGNOSIS — Z1231 Encounter for screening mammogram for malignant neoplasm of breast: Secondary | ICD-10-CM

## 2023-03-30 ENCOUNTER — Other Ambulatory Visit: Payer: Self-pay

## 2023-03-30 DIAGNOSIS — I70213 Atherosclerosis of native arteries of extremities with intermittent claudication, bilateral legs: Secondary | ICD-10-CM

## 2023-03-30 DIAGNOSIS — I739 Peripheral vascular disease, unspecified: Secondary | ICD-10-CM

## 2023-04-20 ENCOUNTER — Other Ambulatory Visit: Payer: Self-pay

## 2023-04-20 ENCOUNTER — Telehealth: Payer: Self-pay

## 2023-04-20 DIAGNOSIS — I70213 Atherosclerosis of native arteries of extremities with intermittent claudication, bilateral legs: Secondary | ICD-10-CM

## 2023-04-20 DIAGNOSIS — I739 Peripheral vascular disease, unspecified: Secondary | ICD-10-CM

## 2023-04-20 NOTE — Telephone Encounter (Signed)
Pt called with c/o worsening RLE rest pain and pain with walking. She has to stop frequently when walking. Her R toes feel numb at times. She has been scheduled to see APP tomorrow with ABIs. Pt was offered an appt today but did not take the appt, opted for tomorrow afternoon.

## 2023-04-21 ENCOUNTER — Ambulatory Visit (INDEPENDENT_AMBULATORY_CARE_PROVIDER_SITE_OTHER): Payer: Medicaid Other | Admitting: Physician Assistant

## 2023-04-21 ENCOUNTER — Ambulatory Visit: Payer: Medicaid Other

## 2023-04-21 ENCOUNTER — Other Ambulatory Visit: Payer: Self-pay

## 2023-04-21 ENCOUNTER — Ambulatory Visit (HOSPITAL_COMMUNITY)
Admission: RE | Admit: 2023-04-21 | Discharge: 2023-04-21 | Disposition: A | Discharge status: Home / Self Care | Payer: Medicaid Other | Source: Ambulatory Visit | Attending: Vascular Surgery | Admitting: Vascular Surgery

## 2023-04-21 VITALS — BP 169/65 | HR 72 | Temp 98.3°F | Resp 20 | Ht 69.0 in | Wt 220.0 lb

## 2023-04-21 DIAGNOSIS — I739 Peripheral vascular disease, unspecified: Secondary | ICD-10-CM | POA: Insufficient documentation

## 2023-04-21 DIAGNOSIS — I70213 Atherosclerosis of native arteries of extremities with intermittent claudication, bilateral legs: Secondary | ICD-10-CM | POA: Diagnosis present

## 2023-04-21 DIAGNOSIS — I70221 Atherosclerosis of native arteries of extremities with rest pain, right leg: Secondary | ICD-10-CM

## 2023-04-21 MED ORDER — OXYCODONE-ACETAMINOPHEN 5-325 MG PO TABS
1.0000 | ORAL_TABLET | Freq: Four times a day (QID) | ORAL | 0 refills | Status: DC | PRN
Start: 2023-04-21 — End: 2023-04-23

## 2023-04-21 NOTE — Progress Notes (Signed)
[facility] [place] HISTORY AND PHYSICAL   History of Present Illness:  Patient is a 62 y.o. year old female who presents for evaluation of  Right LE pain and purple toes.  [place] and her foot started hurting so much it made her cry.  She has known recent history of right LE rest pain. S/P  Percutaneous mechanical thrombectom and Stent, right common iliac artery using overlapping 8 mm VBX stents.  She has a history of right common iliac artery stent by Dr. Allyson Sabal in September 2023.   She has rest pain and pain with ambulation.  There are no open wounds.  She is medically managed on Brilinta and Lipitor.       Past Medical History:  Diagnosis Date   CAD (coronary artery disease)    07/10/17 PCI/DES to LAD, EF 35-40%   Diabetes mellitus without complication (HCC)    Hyperlipidemia    Hypertension    NSTEMI (non-ST elevated myocardial infarction) [facility])     Past Surgical History:  Procedure Laterality Date   ABDOMINAL AORTOGRAM W/LOWER EXTREMITY N/A 07/26/2022   Procedure: ABDOMINAL AORTOGRAM W/LOWER EXTREMITY;  Surgeon: Runell Gess, MD;  Location: [facility] INVASIVE CV LAB;  Service: Cardiovascular;  Laterality: N/A;   AORTOGRAM Bilateral 02/05/2023   Procedure: AORTOGRAM WITH RUN OFF;  Surgeon: Nada Libman, MD;  Location: [facility] OR;  Service: Vascular;  Laterality: Bilateral;   CORONARY PRESSURE/FFR STUDY N/A 07/10/2017   Procedure: INTRAVASCULAR PRESSURE WIRE/FFR STUDY;  Surgeon: Swaziland, Peter M, MD;  Location: [facility] INVASIVE CV LAB;  Service: Cardiovascular;  Laterality: N/A;   CORONARY PRESSURE/FFR STUDY N/A 05/31/2022   Procedure: INTRAVASCULAR PRESSURE WIRE/FFR STUDY;  Surgeon: Yvonne Kendall, MD;  Location: [facility];  Service: Cardiovascular;  Laterality: N/A;   CORONARY STENT INTERVENTION N/A 07/10/2017   Procedure: CORONARY STENT INTERVENTION;  Surgeon: Swaziland, Peter M, MD;  Location: [facility] INVASIVE CV LAB;  Service:  Cardiovascular;  Laterality: N/A;   CORONARY STENT INTERVENTION N/A 05/31/2022   Procedure: CORONARY STENT INTERVENTION;  Surgeon: Yvonne Kendall, MD;  Location: [facility];  Service: Cardiovascular;  Laterality: N/A;   INSERTION OF ILIAC STENT Right 02/05/2023   Procedure: INSERTION OF RIGHT ILIAC STENT;  Surgeon: Nada Libman, MD;  Location: [facility] OR;  Service: Vascular;  Laterality: Right;   LEFT HEART CATH AND CORONARY ANGIOGRAPHY N/A 07/10/2017   Procedure: LEFT HEART CATH AND CORONARY ANGIOGRAPHY;  Surgeon: Swaziland, Peter M, MD;  Location: [facility] INVASIVE CV LAB;  Service: Cardiovascular;  Laterality: N/A;   LOWER EXTREMITY ANGIOGRAM N/A 02/05/2023   Procedure: RIGHT LOWER EXTREMITY ANGIOGRAM;  Surgeon: Nada Libman, MD;  Location: [facility] OR;  Service: Vascular;  Laterality: N/A;   PERIPHERAL VASCULAR INTERVENTION  07/26/2022   Procedure: PERIPHERAL VASCULAR INTERVENTION;  Surgeon: Runell Gess, MD;  Location: [facility];  Service: Cardiovascular;;   RIGHT/LEFT HEART CATH AND CORONARY ANGIOGRAPHY N/A 05/31/2022   Procedure: RIGHT/LEFT HEART CATH AND CORONARY ANGIOGRAPHY;  Surgeon: Yvonne Kendall, MD;  [facility]: [facility];  Service: Cardiovascular;  Laterality: N/A;   ULTRASOUND GUIDANCE FOR VASCULAR ACCESS Bilateral 02/05/2023   Procedure: ULTRASOUND GUIDANCE FOR VASCULAR ACCESS;  Surgeon: Nada Libman, MD;  Location: [facility] OR;  Service: Vascular;  Laterality: Bilateral;    ROS:   General:  No weight loss, Fever, chills  HEENT: No recent headaches, no nasal bleeding, no visual changes, no sore throat  Neurologic:  No dizziness, blackouts, seizures. No recent symptoms of stroke or mini- stroke. No recent episodes of slurred speech, or temporary blindness.  Cardiac: No recent episodes of chest pain/pressure, no shortness of breath at rest.  No shortness of breath with exertion.  Denies history of atrial fibrillation or irregular heartbeat  Vascular: positive  history of rest pain in feet.  No history of claudication.  No history of non-healing ulcer, No history of DVT   Pulmonary: No home oxygen, no productive cough, no hemoptysis,  No asthma or wheezing  Musculoskeletal:  [ ]  Arthritis, [ ]  Low back pain,  [ ]  Joint pain  Hematologic:No history of hypercoagulable state.  No history of easy bleeding.  No history of anemia  Gastrointestinal: No hematochezia or melena,  No gastroesophageal reflux, no trouble swallowing  Urinary: [ ]  chronic Kidney disease, [ ]  on HD - [ ]  MWF or [ ]  TTHS, [ ]  Burning with urination, [ ]  Frequent urination, [ ]  Difficulty urinating;   Skin: No rashes  Psychological: No history of anxiety,  No history of depression  Social History Social History   Tobacco Use   Smoking status: Every Day    Packs/day: 0.50    Years: 10.00    Additional pack years: 0.00    Total pack years: 5.00    Types: Cigarettes   Smokeless tobacco: Never  Vaping Use   Vaping Use: Never used  Substance Use Topics   Alcohol use: No   Drug use: No    Family History History reviewed. No pertinent family history.  Allergies  Allergies  Allergen Reactions   Codeine Itching     Current Outpatient Medications  Medication Sig Dispense Refill   aspirin EC 81 MG tablet Take 81 mg by mouth daily.     atorvastatin (LIPITOR) 80 MG tablet Take 1 tablet (80 mg total) by mouth daily at 6 PM. 30 tablet 0   carvedilol (COREG) 6.25 MG tablet Take 1 tablet (6.25 mg total) by mouth 2 (two) times daily with a meal. 60 tablet 0   Cholecalciferol (D3 HIGH POTENCY) 125 MCG (5000 UT) capsule Take 5,000 Units by mouth daily.     clonazePAM (KLONOPIN) 1 MG tablet Take 1 mg by mouth 2 (two) times daily.     ezetimibe (ZETIA) 10 MG tablet Take 1 tablet (10 mg total) by mouth daily. 90 tablet 3   FARXIGA 10 MG TABS tablet Take 10 mg by mouth daily.      nitroGLYCERIN (NITROSTAT) 0.4 MG SL tablet Place 0.4 mg under the tongue as needed for chest pain.      olmesartan (BENICAR) 20 MG tablet Take 1 tablet (20 mg total) by mouth daily. 90 tablet 3   oxyCODONE-acetaminophen (PERCOCET/ROXICET) 5-325 MG tablet Take 1 tablet by mouth every 6 (six) hours as needed. 30 tablet 0   OZEMPIC, 2 MG/DOSE, 8 MG/3ML SOPN Inject 2 mg into the skin once a week.     pantoprazole (PROTONIX) 40 MG tablet Take 1 tablet (40 mg total) by mouth daily. 30 tablet 0   pregabalin (LYRICA) 150 MG capsule Take 150 mg by mouth 2 (two) times daily.     sertraline (ZOLOFT) 100 MG tablet Take 100 mg by mouth daily.     ticagrelor (BRILINTA) 90 MG TABS tablet Take 1 tablet (90 mg total) by mouth 2 (two) times daily. 60 tablet 2   VASCEPA 1 g capsule Take 1 capsule (1 g total) by mouth 2 (two) times daily.  180 capsule 3   spironolactone (ALDACTONE) 25 MG tablet Take 1 tablet (25 mg total) by mouth daily. 90 tablet 3   No current facility-administered medications for this visit.    Physical Examination  Vitals:   04/21/23 1437  BP: (!) 169/65  Pulse: 72  Resp: 20  Temp: 98.3 F (36.8 C)  SpO2: 92%  Weight: 220 lb (99.8 kg)  Height: 5\' 9"  (1.753 m)    Body mass index is 32.49 kg/m.  General:  Alert and oriented, no acute distress HEENT: Normal Neck: No bruit or JVD Pulmonary: Clear to auscultation bilaterally Cardiac: Regular Rate and Rhythm without murmur Abdomen: Soft, non-tender, non-distended, no mass, no scars Skin: No rash, purple toes discoloration right foot Extremity Pulses:  no pedal pulses, weak doppler right  PT with intact motor Musculoskeletal: No deformity or edema  Neurologic: Upper and lower extremity motor grossly intact and symmetric  DATA:   ABI Findings:  +---------+------------------+-----+-------------------+--------+  Right   Rt Pressure (mmHg)IndexWaveform           Comment   +---------+------------------+-----+-------------------+--------+  Brachial 164                                                  +---------+------------------+-----+-------------------+--------+  PTA     53                0.32 monophasic                   +---------+------------------+-----+-------------------+--------+  DP      50                0.30 dampened monophasic          +---------+------------------+-----+-------------------+--------+  Great Toe                                          absent    +---------+------------------+-----+-------------------+--------+   +---------+------------------+-----+--------+-------+  Left    Lt Pressure (mmHg)IndexWaveformComment  +---------+------------------+-----+--------+-------+  Brachial 162                                     +---------+------------------+-----+--------+-------+  PTA     141               0.86 biphasic         +---------+------------------+-----+--------+-------+  DP      125               0.76 biphasic         +---------+------------------+-----+--------+-------+  Great Toe86                0.52                  +---------+------------------+-----+--------+-------+   +-------+-----------+-----------+------------+------------+  ABI/TBIToday's ABIToday's TBIPrevious ABIPrevious TBI  +-------+-----------+-----------+------------+------------+  Right 0.32       absent                               +-------+-----------+-----------+------------+------------+  Left  0.86       0.52                                 +-------+-----------+-----------+------------+------------+  Summary:  Right: Resting right ankle-brachial index indicates severe right lower  extremity arterial disease.   Left: Resting left ankle-brachial index indicates mild left lower  extremity arterial disease. The left toe-brachial index is abnormal.   ASSESSMENT/ PLAN:  PAD Sever rest pain right LE with history of right iliac disease. She is having difficulty walking due to the pain.  Motor and gross  sensation are intact right foot/toes. History of iliac disease requiring Common iliac stent by Dr. Allyson Sabal, then recently  Clinically, she appears to have a inflow issue with early ischemia of the toes.  Motor is intact, gross sensation intact.  Toes are cool to touch.  Weak PT doppler signal right LE intact.  The ABI shows a significant droop in inflow to the foot and toes with 0 TBI and < 32 index and monophasic wave forms.  On her last visit she had 100 index and biphasic flow with normal toe pressures on 03/21/23 ABI  I will schedule her to urgent aortogram with iliac angioplasty/stenting and possible tibial intervention.  I sent oxycodone to her pharmacy to aide with the pain.       Mosetta Pigeon PA-C [facility] [place] Office: [phone]  MD on Call [facility]

## 2023-04-21 NOTE — H&P (View-Only) (Signed)
[facility] [place] HISTORY AND PHYSICAL   History of Present Illness:  Patient is a 61 y.o. year old female who presents for evaluation of  Right LE pain and purple toes.  [place] and her foot started hurting so much it made her cry.  She has known recent history of right LE rest pain. S/P  Percutaneous mechanical thrombectom and Stent, right common iliac artery using overlapping 8 mm VBX stents.  She has a history of right common iliac artery stent by Dr. Berry in September 2023.   She has rest pain and pain with ambulation.  There are no open wounds.  She is medically managed on Brilinta and Lipitor.       Past Medical History:  Diagnosis Date   CAD (coronary artery disease)    07/10/17 PCI/DES to LAD, EF 35-40%   Diabetes mellitus without complication (HCC)    Hyperlipidemia    Hypertension    NSTEMI (non-ST elevated myocardial infarction) (HCC)     Past Surgical History:  Procedure Laterality Date   ABDOMINAL AORTOGRAM W/LOWER EXTREMITY N/A 07/26/2022   Procedure: ABDOMINAL AORTOGRAM W/LOWER EXTREMITY;  Surgeon: Berry, Jonathan J, MD;  Location: [facility] INVASIVE CV LAB;  Service: Cardiovascular;  Laterality: N/A;   AORTOGRAM Bilateral 02/05/2023   Procedure: AORTOGRAM WITH RUN OFF;  Surgeon: Brabham, Vance W, MD;  Location: [facility] OR;  Service: Vascular;  Laterality: Bilateral;   CORONARY PRESSURE/FFR STUDY N/A 07/10/2017   Procedure: INTRAVASCULAR PRESSURE WIRE/FFR STUDY;  Surgeon: Jordan, Peter M, MD;  Location: [facility] CV LAB;  Service: Cardiovascular;  Laterality: N/A;   CORONARY PRESSURE/FFR STUDY N/A 05/31/2022   Procedure: INTRAVASCULAR PRESSURE WIRE/FFR STUDY;  Surgeon: End, Christopher, MD;  Location: [facility];  Service: Cardiovascular;  Laterality: N/A;   CORONARY STENT INTERVENTION N/A 07/10/2017   Procedure: CORONARY STENT INTERVENTION;  Surgeon: Jordan, Peter M, MD;  [facility]: [facility];  Service:  Cardiovascular;  Laterality: N/A;   CORONARY STENT INTERVENTION N/A 05/31/2022   Procedure: CORONARY STENT INTERVENTION;  Surgeon: End, Christopher, MD;  Location: [facility];  Service: Cardiovascular;  Laterality: N/A;   INSERTION OF ILIAC STENT Right 02/05/2023   Procedure: INSERTION OF RIGHT ILIAC STENT;  Surgeon: Brabham, Vance W, MD;  Location: [facility] OR;  Service: Vascular;  Laterality: Right;   LEFT HEART CATH AND CORONARY ANGIOGRAPHY N/A 07/10/2017   Procedure: LEFT HEART CATH AND CORONARY ANGIOGRAPHY;  Surgeon: Jordan, Peter M, MD;  [facility]: [facility];  Service: Cardiovascular;  Laterality: N/A;   LOWER EXTREMITY ANGIOGRAM N/A 02/05/2023   Procedure: RIGHT LOWER EXTREMITY ANGIOGRAM;  Surgeon: Brabham, Vance W, MD;  Location: [facility] OR;  Service: Vascular;  Laterality: N/A;   PERIPHERAL VASCULAR INTERVENTION  07/26/2022   Procedure: PERIPHERAL VASCULAR INTERVENTION;  Surgeon: Berry, Jonathan J, MD;  Location: [facility];  Service: Cardiovascular;;   RIGHT/LEFT HEART CATH AND CORONARY ANGIOGRAPHY N/A 05/31/2022   Procedure: RIGHT/LEFT HEART CATH AND CORONARY ANGIOGRAPHY;  Surgeon: End, Christopher, MD;  Location: [facility];  Service: Cardiovascular;  Laterality: N/A;   ULTRASOUND GUIDANCE FOR VASCULAR ACCESS Bilateral 02/05/2023   Procedure: ULTRASOUND GUIDANCE FOR VASCULAR ACCESS;  Surgeon: Brabham, Vance W, MD;  Location: [facility] OR;  Service: Vascular;  Laterality: Bilateral;    ROS:   General:  No weight loss, Fever, chills  HEENT: No recent headaches, no nasal bleeding, no visual changes, no sore throat  Neurologic:   No dizziness, blackouts, seizures. No recent symptoms of stroke or mini- stroke. No recent episodes of slurred speech, or temporary blindness.  Cardiac: No recent episodes of chest pain/pressure, no shortness of breath at rest.  No shortness of breath with exertion.  Denies history of atrial fibrillation or irregular heartbeat  Vascular: positive  history of rest pain in feet.  No history of claudication.  No history of non-healing ulcer, No history of DVT   Pulmonary: No home oxygen, no productive cough, no hemoptysis,  No asthma or wheezing  Musculoskeletal:  [ ] Arthritis, [ ] Low back pain,  [ ] Joint pain  Hematologic:No history of hypercoagulable state.  No history of easy bleeding.  No history of anemia  Gastrointestinal: No hematochezia or melena,  No gastroesophageal reflux, no trouble swallowing  Urinary: [ ] chronic Kidney disease, [ ] on HD - [ ] MWF or [ ] TTHS, [ ] Burning with urination, [ ] Frequent urination, [ ] Difficulty urinating;   Skin: No rashes  Psychological: No history of anxiety,  No history of depression  Social History Social History   Tobacco Use   Smoking status: Every Day    Packs/day: 0.50    Years: 10.00    Additional pack years: 0.00    Total pack years: 5.00    Types: Cigarettes   Smokeless tobacco: Never  Vaping Use   Vaping Use: Never used  Substance Use Topics   Alcohol use: No   Drug use: No    Family History History reviewed. No pertinent family history.  Allergies  Allergies  Allergen Reactions   Codeine Itching     Current Outpatient Medications  Medication Sig Dispense Refill   aspirin EC 81 MG tablet Take 81 mg by mouth daily.     atorvastatin (LIPITOR) 80 MG tablet Take 1 tablet (80 mg total) by mouth daily at 6 PM. 30 tablet 0   carvedilol (COREG) 6.25 MG tablet Take 1 tablet (6.25 mg total) by mouth 2 (two) times daily with a meal. 60 tablet 0   Cholecalciferol (D3 HIGH POTENCY) 125 MCG (5000 UT) capsule Take 5,000 Units by mouth daily.     clonazePAM (KLONOPIN) 1 MG tablet Take 1 mg by mouth 2 (two) times daily.     ezetimibe (ZETIA) 10 MG tablet Take 1 tablet (10 mg total) by mouth daily. 90 tablet 3   FARXIGA 10 MG TABS tablet Take 10 mg by mouth daily.      nitroGLYCERIN (NITROSTAT) 0.4 MG SL tablet Place 0.4 mg under the tongue as needed for chest pain.      olmesartan (BENICAR) 20 MG tablet Take 1 tablet (20 mg total) by mouth daily. 90 tablet 3   oxyCODONE-acetaminophen (PERCOCET/ROXICET) 5-325 MG tablet Take 1 tablet by mouth every 6 (six) hours as needed. 30 tablet 0   OZEMPIC, 2 MG/DOSE, 8 MG/3ML SOPN Inject 2 mg into the skin once a week.     pantoprazole (PROTONIX) 40 MG tablet Take 1 tablet (40 mg total) by mouth daily. 30 tablet 0   pregabalin (LYRICA) 150 MG capsule Take 150 mg by mouth 2 (two) times daily.     sertraline (ZOLOFT) 100 MG tablet Take 100 mg by mouth daily.     ticagrelor (BRILINTA) 90 MG TABS tablet Take 1 tablet (90 mg total) by mouth 2 (two) times daily. 60 tablet 2   VASCEPA 1 g capsule Take 1 capsule (1 g total) by mouth 2 (two) times daily.   180 capsule 3   spironolactone (ALDACTONE) 25 MG tablet Take 1 tablet (25 mg total) by mouth daily. 90 tablet 3   No current facility-administered medications for this visit.    Physical Examination  Vitals:   04/21/23 1437  BP: (!) 169/65  Pulse: 72  Resp: 20  Temp: 98.3 F (36.8 C)  SpO2: 92%  Weight: 220 lb (99.8 kg)  Height: 5' 9" (1.753 m)    Body mass index is 32.49 kg/m.  General:  Alert and oriented, no acute distress HEENT: Normal Neck: No bruit or JVD Pulmonary: Clear to auscultation bilaterally Cardiac: Regular Rate and Rhythm without murmur Abdomen: Soft, non-tender, non-distended, no mass, no scars Skin: No rash, purple toes discoloration right foot Extremity Pulses:  no pedal pulses, weak doppler right  PT with intact motor Musculoskeletal: No deformity or edema  Neurologic: Upper and lower extremity motor grossly intact and symmetric  DATA:   ABI Findings:  +---------+------------------+-----+-------------------+--------+  Right   Rt Pressure (mmHg)IndexWaveform           Comment   +---------+------------------+-----+-------------------+--------+  Brachial 164                                                  +---------+------------------+-----+-------------------+--------+  PTA     53                0.32 monophasic                   +---------+------------------+-----+-------------------+--------+  DP      50                0.30 dampened monophasic          +---------+------------------+-----+-------------------+--------+  Great Toe                                          absent    +---------+------------------+-----+-------------------+--------+   +---------+------------------+-----+--------+-------+  Left    Lt Pressure (mmHg)IndexWaveformComment  +---------+------------------+-----+--------+-------+  Brachial 162                                     +---------+------------------+-----+--------+-------+  PTA     141               0.86 biphasic         +---------+------------------+-----+--------+-------+  DP      125               0.76 biphasic         +---------+------------------+-----+--------+-------+  Great Toe86                0.52                  +---------+------------------+-----+--------+-------+   +-------+-----------+-----------+------------+------------+  ABI/TBIToday's ABIToday's TBIPrevious ABIPrevious TBI  +-------+-----------+-----------+------------+------------+  Right 0.32       absent                               +-------+-----------+-----------+------------+------------+  Left  0.86       0.52                                 +-------+-----------+-----------+------------+------------+        Summary:  Right: Resting right ankle-brachial index indicates severe right lower  extremity arterial disease.   Left: Resting left ankle-brachial index indicates mild left lower  extremity arterial disease. The left toe-brachial index is abnormal.   ASSESSMENT/ PLAN:  PAD Sever rest pain right LE with history of right iliac disease. She is having difficulty walking due to the pain.  Motor and gross  sensation are intact right foot/toes. History of iliac disease requiring Common iliac stent by Dr. Berry, then recently  Clinically, she appears to have a inflow issue with early ischemia of the toes.  Motor is intact, gross sensation intact.  Toes are cool to touch.  Weak PT doppler signal right LE intact.  The ABI shows a significant droop in inflow to the foot and toes with 0 TBI and < 32 index and monophasic wave forms.  On her last visit she had 100 index and biphasic flow with normal toe pressures on 03/21/23 ABI  I will schedule her to urgent aortogram with iliac angioplasty/stenting and possible tibial intervention.  I sent oxycodone to her pharmacy to aide with the pain.       Axyl Sitzman Maureen Naylin Burkle PA-C [facility] [place] Office: [phone]  MD on Call [place] 

## 2023-04-22 ENCOUNTER — Encounter (HOSPITAL_COMMUNITY)
Admission: RE | Disposition: A | Discharge status: Home / Self Care | Payer: Self-pay | Source: Home / Self Care | Attending: Vascular Surgery

## 2023-04-22 ENCOUNTER — Observation Stay (HOSPITAL_COMMUNITY)
Admission: RE | Admit: 2023-04-22 | Discharge: 2023-04-23 | Disposition: A | Discharge status: Home / Self Care | Payer: Medicaid Other | Attending: Vascular Surgery | Admitting: Vascular Surgery

## 2023-04-22 DIAGNOSIS — Z7902 Long term (current) use of antithrombotics/antiplatelets: Secondary | ICD-10-CM | POA: Insufficient documentation

## 2023-04-22 DIAGNOSIS — E1151 Type 2 diabetes mellitus with diabetic peripheral angiopathy without gangrene: Principal | ICD-10-CM | POA: Insufficient documentation

## 2023-04-22 DIAGNOSIS — Z79899 Other long term (current) drug therapy: Secondary | ICD-10-CM | POA: Insufficient documentation

## 2023-04-22 DIAGNOSIS — Z7982 Long term (current) use of aspirin: Secondary | ICD-10-CM | POA: Insufficient documentation

## 2023-04-22 DIAGNOSIS — I745 Embolism and thrombosis of iliac artery: Secondary | ICD-10-CM | POA: Diagnosis not present

## 2023-04-22 DIAGNOSIS — I70221 Atherosclerosis of native arteries of extremities with rest pain, right leg: Secondary | ICD-10-CM | POA: Diagnosis not present

## 2023-04-22 DIAGNOSIS — F17210 Nicotine dependence, cigarettes, uncomplicated: Secondary | ICD-10-CM | POA: Diagnosis not present

## 2023-04-22 DIAGNOSIS — I70229 Atherosclerosis of native arteries of extremities with rest pain, unspecified extremity: Secondary | ICD-10-CM | POA: Diagnosis present

## 2023-04-22 HISTORY — PX: ABDOMINAL AORTOGRAM W/LOWER EXTREMITY: CATH118223

## 2023-04-22 HISTORY — PX: PERIPHERAL VASCULAR INTERVENTION: CATH118257

## 2023-04-22 LAB — POCT I-STAT, CHEM 8
BUN: 9 mg/dL (ref 8–23)
Calcium, Ion: 1.17 mmol/L (ref 1.15–1.40)
Chloride: 106 mmol/L (ref 98–111)
Creatinine, Ser: 0.8 mg/dL (ref 0.44–1.00)
Glucose, Bld: 159 mg/dL — ABNORMAL HIGH (ref 70–99)
HCT: 37 % (ref 36.0–46.0)
Hemoglobin: 12.6 g/dL (ref 12.0–15.0)
Potassium: 3.3 mmol/L — ABNORMAL LOW (ref 3.5–5.1)
Sodium: 143 mmol/L (ref 135–145)
TCO2: 25 mmol/L (ref 22–32)

## 2023-04-22 LAB — GLUCOSE, CAPILLARY
Glucose-Capillary: 163 mg/dL — ABNORMAL HIGH (ref 70–99)
Glucose-Capillary: 154 mg/dL — ABNORMAL HIGH (ref 70–99)
Glucose-Capillary: 163 mg/dL — ABNORMAL HIGH (ref 70–99)

## 2023-04-22 LAB — VAS US ABI WITH/WO TBI
Left ABI: 0.86
Right ABI: 0.32

## 2023-04-22 LAB — POCT ACTIVATED CLOTTING TIME: Activated Clotting Time: 134 seconds

## 2023-04-22 SURGERY — ABDOMINAL AORTOGRAM W/LOWER EXTREMITY
Anesthesia: LOCAL

## 2023-04-22 MED ORDER — PREGABALIN 25 MG PO CAPS
150.0000 mg | ORAL_CAPSULE | Freq: Two times a day (BID) | ORAL | Status: DC
  Administered 2023-04-22 – 2023-04-23 (×2): 150 mg via ORAL
  Filled 2023-04-22 (×2): qty 6

## 2023-04-22 MED ORDER — ONDANSETRON HCL 4 MG/2ML IJ SOLN
INTRAMUSCULAR | Status: AC
  Filled 2023-04-22: qty 2

## 2023-04-22 MED ORDER — LIDOCAINE HCL (PF) 1 % IJ SOLN
INTRAMUSCULAR | Status: AC
  Filled 2023-04-22: qty 30

## 2023-04-22 MED ORDER — MIDAZOLAM HCL 2 MG/2ML IJ SOLN
INTRAMUSCULAR | Status: DC | PRN
  Administered 2023-04-22 (×4): 1 mg via INTRAVENOUS

## 2023-04-22 MED ORDER — TICAGRELOR 90 MG PO TABS
90.0000 mg | ORAL_TABLET | Freq: Two times a day (BID) | ORAL | Status: DC
  Administered 2023-04-23: 90 mg via ORAL
  Filled 2023-04-22: qty 1

## 2023-04-22 MED ORDER — SODIUM CHLORIDE 0.9 % IV SOLN: INTRAVENOUS | Status: DC

## 2023-04-22 MED ORDER — ONDANSETRON HCL 4 MG/2ML IJ SOLN
INTRAMUSCULAR | Status: DC | PRN
  Administered 2023-04-22: 4 mg via INTRAVENOUS

## 2023-04-22 MED ORDER — HYDRALAZINE HCL 20 MG/ML IJ SOLN: 5.0000 mg | INTRAMUSCULAR | Status: DC | PRN

## 2023-04-22 MED ORDER — ATORVASTATIN CALCIUM 80 MG PO TABS
80.0000 mg | ORAL_TABLET | Freq: Every day | ORAL | Status: DC
  Administered 2023-04-22: 80 mg via ORAL
  Filled 2023-04-22: qty 1

## 2023-04-22 MED ORDER — PROTAMINE SULFATE 10 MG/ML IV SOLN
INTRAVENOUS | Status: AC
  Filled 2023-04-22: qty 5

## 2023-04-22 MED ORDER — LABETALOL HCL 5 MG/ML IV SOLN
INTRAVENOUS | Status: AC
  Administered 2023-04-22: 10 mg via INTRAVENOUS
  Filled 2023-04-22: qty 4

## 2023-04-22 MED ORDER — ASPIRIN 81 MG PO TBEC
81.0000 mg | DELAYED_RELEASE_TABLET | Freq: Every day | ORAL | Status: DC
  Administered 2023-04-23: 81 mg via ORAL
  Filled 2023-04-22: qty 1

## 2023-04-22 MED ORDER — LIDOCAINE HCL (PF) 1 % IJ SOLN
INTRAMUSCULAR | Status: DC | PRN
  Administered 2023-04-22: 25 mL via INTRADERMAL

## 2023-04-22 MED ORDER — IRBESARTAN 150 MG PO TABS
150.0000 mg | ORAL_TABLET | Freq: Every day | ORAL | Status: DC
  Administered 2023-04-22 – 2023-04-23 (×2): 150 mg via ORAL
  Filled 2023-04-22 (×2): qty 1

## 2023-04-22 MED ORDER — SERTRALINE HCL 100 MG PO TABS
100.0000 mg | ORAL_TABLET | Freq: Every day | ORAL | Status: DC
  Administered 2023-04-22 – 2023-04-23 (×2): 100 mg via ORAL
  Filled 2023-04-22 (×2): qty 1

## 2023-04-22 MED ORDER — MIDAZOLAM HCL 2 MG/2ML IJ SOLN
INTRAMUSCULAR | Status: AC
  Filled 2023-04-22: qty 2

## 2023-04-22 MED ORDER — ONDANSETRON HCL 4 MG/2ML IJ SOLN: 4.0000 mg | Freq: Four times a day (QID) | INTRAMUSCULAR | Status: DC | PRN

## 2023-04-22 MED ORDER — FENTANYL CITRATE (PF) 100 MCG/2ML IJ SOLN
INTRAMUSCULAR | Status: AC
  Filled 2023-04-22: qty 2

## 2023-04-22 MED ORDER — HEPARIN SODIUM (PORCINE) 1000 UNIT/ML IJ SOLN
INTRAMUSCULAR | Status: DC | PRN
  Administered 2023-04-22: 5000 [IU] via INTRAVENOUS
  Administered 2023-04-22: 10000 [IU] via INTRAVENOUS

## 2023-04-22 MED ORDER — OXYCODONE-ACETAMINOPHEN 5-325 MG PO TABS
ORAL_TABLET | ORAL | Status: AC
  Administered 2023-04-22: 2 via ORAL
  Filled 2023-04-22: qty 2

## 2023-04-22 MED ORDER — DAPAGLIFLOZIN PROPANEDIOL 10 MG PO TABS
10.0000 mg | ORAL_TABLET | Freq: Every day | ORAL | Status: DC
  Administered 2023-04-23: 10 mg via ORAL
  Filled 2023-04-22: qty 1

## 2023-04-22 MED ORDER — FENTANYL CITRATE (PF) 100 MCG/2ML IJ SOLN
INTRAMUSCULAR | Status: DC | PRN
  Administered 2023-04-22 (×4): 25 ug via INTRAVENOUS
  Administered 2023-04-22: 50 ug via INTRAVENOUS

## 2023-04-22 MED ORDER — IODIXANOL 320 MG/ML IV SOLN
INTRAVENOUS | Status: DC | PRN
  Administered 2023-04-22: 80 mL via INTRA_ARTERIAL

## 2023-04-22 MED ORDER — LABETALOL HCL 5 MG/ML IV SOLN
INTRAVENOUS | Status: AC
  Filled 2023-04-22: qty 4

## 2023-04-22 MED ORDER — LABETALOL HCL 5 MG/ML IV SOLN: 10.0000 mg | INTRAVENOUS | Status: DC | PRN

## 2023-04-22 MED ORDER — HEPARIN (PORCINE) IN NACL 1000-0.9 UT/500ML-% IV SOLN
INTRAVENOUS | Status: DC | PRN
  Administered 2023-04-22: 1000 mL

## 2023-04-22 MED ORDER — LABETALOL HCL 5 MG/ML IV SOLN
INTRAVENOUS | Status: DC | PRN
  Administered 2023-04-22: 20 mg via INTRAVENOUS

## 2023-04-22 MED ORDER — HEPARIN SODIUM (PORCINE) 1000 UNIT/ML IJ SOLN
INTRAMUSCULAR | Status: AC
  Filled 2023-04-22: qty 10

## 2023-04-22 MED ORDER — CARVEDILOL 6.25 MG PO TABS
6.2500 mg | ORAL_TABLET | Freq: Two times a day (BID) | ORAL | Status: DC
  Administered 2023-04-22 – 2023-04-23 (×2): 6.25 mg via ORAL
  Filled 2023-04-22 (×2): qty 1

## 2023-04-22 MED ORDER — SODIUM CHLORIDE 0.9 % WEIGHT BASED INFUSION
1.0000 mL/kg/h | INTRAVENOUS | Status: AC
  Administered 2023-04-22: 1 mL/kg/h via INTRAVENOUS

## 2023-04-22 MED ORDER — CLONAZEPAM 0.5 MG PO TABS
1.0000 mg | ORAL_TABLET | Freq: Two times a day (BID) | ORAL | Status: DC
  Administered 2023-04-22 – 2023-04-23 (×2): 1 mg via ORAL
  Filled 2023-04-22 (×2): qty 2

## 2023-04-22 MED ORDER — OXYCODONE-ACETAMINOPHEN 5-325 MG PO TABS
2.0000 | ORAL_TABLET | ORAL | Status: DC | PRN
  Administered 2023-04-23: 2 via ORAL
  Filled 2023-04-22: qty 2

## 2023-04-22 MED ORDER — PROTAMINE SULFATE 10 MG/ML IV SOLN
INTRAVENOUS | Status: DC | PRN
  Administered 2023-04-22: 5 mg via INTRAVENOUS
  Administered 2023-04-22: 45 mg via INTRAVENOUS

## 2023-04-22 MED ORDER — ACETAMINOPHEN 325 MG PO TABS: 650.0000 mg | ORAL_TABLET | ORAL | Status: DC | PRN

## 2023-04-22 SURGICAL SUPPLY — 30 items
BAG SNAP BAND KOVER 36X36 (MISCELLANEOUS) IMPLANT
BALLN MUSTANG 6.0X100X75 (BALLOONS) ×2
BALLOON MUSTANG 6.0X100X75 (BALLOONS) IMPLANT
CATH ANGIO 5F BER2 100CM (CATHETERS) IMPLANT
CATH CROSS OVER TEMPO 5F (CATHETERS) IMPLANT
CATH OMNI FLUSH 5F 65CM (CATHETERS) IMPLANT
CATH QUICKCROSS SUPP .035X90CM (MICROCATHETER) IMPLANT
CLOSURE PERCLOSE PROSTYLE (VASCULAR PRODUCTS) IMPLANT
DEVICE ENSNARE 12MMX20MM (VASCULAR PRODUCTS) IMPLANT
DEVICE ONE SNARE 10MM (MISCELLANEOUS) IMPLANT
DEVICE TORQUE .014-.018 (MISCELLANEOUS) IMPLANT
DEVICE TORQUE .025-.038 (MISCELLANEOUS) IMPLANT
GLIDEWIRE ADV .035X260CM (WIRE) IMPLANT
GUIDEWIRE ANGLED .035X260CM (WIRE) IMPLANT
KIT MICROPUNCTURE NIT STIFF (SHEATH) IMPLANT
KIT PV (KITS) ×2 IMPLANT
SHEATH GUIDING 7F 55X73X9MM TD (SHEATH) IMPLANT
SHEATH PINNACLE 5F 10CM (SHEATH) IMPLANT
SHEATH PINNACLE 6F 10CM (SHEATH) IMPLANT
SHEATH PROBE COVER 6X72 (BAG) IMPLANT
STENT ELUVIA 7X100X130 (Permanent Stent) IMPLANT
STENT ELUVIA 7X40X130 (Permanent Stent) IMPLANT
STENT VIABAHN 7X79 6FR 80 (Permanent Stent) IMPLANT
STOPCOCK MORSE 400PSI 3WAY (MISCELLANEOUS) IMPLANT
SYR MEDRAD MARK 7 150ML (SYRINGE) ×2 IMPLANT
TORQUE DEVICE .014-.018 (MISCELLANEOUS) ×2
TRANSDUCER W/STOPCOCK (MISCELLANEOUS) ×2 IMPLANT
TRAY PV CATH (CUSTOM PROCEDURE TRAY) ×2 IMPLANT
TUBING CIL FLEX 10 FLL-RA (TUBING) IMPLANT
WIRE STARTER BENTSON 035X150 (WIRE) IMPLANT

## 2023-04-22 NOTE — Progress Notes (Signed)
28F arterial sheath right groin removed.  Manual pressure held for 30 minutes.  Right groin level 0 pre and post pull.   Right DP pulse doppled post sheath pull.  Bedrest started at 1700.  Gauze and tegaderm applied to right groin.  Instructions reviewed with patient.

## 2023-04-22 NOTE — Op Note (Signed)
DATE OF SERVICE: 04/22/2023  PATIENT:  Alice Bennett  62 y.o. 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 Lenell Antu, MD  ASSISTANT: none  ANESTHESIA:   local and IV sedation  ESTIMATED BLOOD LOSS: minimal  LOCAL MEDICATIONS USED:  LIDOCAINE   COUNTS: confirmed correct.  PATIENT DISPOSITION:  PACU - hemodynamically stable.   Delay start of Pharmacological VTE agent (>24hrs) due to surgical blood loss or risk of bleeding: no  INDICATION FOR PROCEDURE: Alice Bennett is a 62 y.o. female with right lower extremity ischemic rest pain after recent common iliac artery stenting. After careful discussion of risks, benefits, and alternatives the patient was offered angiography. The patient understood and wished to proceed.  OPERATIVE FINDINGS:  Terminal aorta and iliac arteries: Renal arteries patent bilaterally Terminal aorta widely patent A "stump" of right common iliac artery is patent, after this entire right iliac artery system is occluded.  The hypogastric artery on the right fills via collaterals across the pelvis.  The distal external iliac/common femoral artery reconstitutes via collateral flow Left iliac system widely patent  Follow-up angiogram after iliac intervention shows recanalization of the occluded right iliac segment.  Right lower extremity: Common femoral artery: patent   Profunda femoris artery: patent  Superficial femoral artery: patent, diffuse disease without stenosis Popliteal artery: patent, diffuse disease without stenosis Anterior tibial artery: patent Tibioperoneal trunk: patent Peroneal artery: patent Posterior tibial artery: patent Pedal circulation: patent  DESCRIPTION OF PROCEDURE: After identification of the patient in the pre-operative holding area, the patient was transferred to the operating room. The patient was positioned supine on the operating room table. Anesthesia was induced. The groins was prepped and draped in standard fashion. A surgical pause was performed confirming correct patient, procedure, and operative location.  The left groin was anesthetized with subcutaneous injection of 1% lidocaine. Using ultrasound guidance, the left common femoral artery was accessed with micropuncture technique. Fluoroscopy was used to confirm cannulation over the femoral head. The 32F sheath was upsized to 51F.   A Benson wire was advanced into the distal aorta. Over the wire an omni flush catheter was advanced to the level of L2. Aortogram was performed - see above for details.   The decision was made to intervene. The patient was heparinized with 10,000 units of heparin. The 51F sheath was exchanged for a 79F oscor sheath. Selective angiography of the left lower extremity was performed prior to intervention.   Unable to cross lesion antegrade with a steerable sheath.  The terminal aorta was too small to allow the feet to form nicely, and once I was able to select the stump of the right common iliac artery, I was not able to establish a true lumen position.  The right groin was anesthetized with subcutaneous injection of 1% lidocaine.  Using ultrasound guidance, the right common femoral artery was accessed with micropuncture technique.  The micropuncture sheath was upsized to 6 [place].  Was able to navigate a Bentson wire easily retrograde  across the  occlusion.  I exchanged for a Glidewire.  I was able to capture the Glidewire using a Tulip snare from the left common femoral artery access and externalized the wire bilaterally.  The lesions were treated with: Right common iliac angioplasty and stenting (7x73mm VBX)  Right external iliac angioplasty and stenting (7x80 mm Eluvia) Right common femoral angioplasty and stenting (7x40 Eluvia)  Completion angiography revealed:  Resolution of the right iliac system occlusion and satisfactory capture of right common femoral thrombus  A Perclose device was used to close the arteriotomy.  On the right, the access was to close to the stenting to permit safe closure device use. Hemostasis was excellent upon completion.  Conscious sedation was administered with the use of IV fentanyl and midazolam under continuous physician and nurse monitoring.  Heart rate, blood pressure, and oxygen saturation were continuously monitored.  Total sedation time was 83 minutes  Upon completion of the case instrument and sharps counts were confirmed correct. The patient was transferred to the PACU in good condition. I was present for all portions of the procedure.  PLAN: Aspirin 81 mg by mouth daily.  Brilinta 90 mg by mouth twice daily.  Xarelto 20 mg by mouth daily starting tomorrow.  Will heparinize the patient 8 hours after case completion.  High intensity statin therapy.  If patient continues to smoke she will likely lose her right leg.  Rande Brunt. Lenell Antu, MD [facility] Phone Number: [phone] 04/22/2023 3:26 PM

## 2023-04-22 NOTE — Interval H&P Note (Signed)
History and Physical Interval Note:  04/22/2023 3:26 PM  Alice Bennett  has presented today for surgery, with the diagnosis of critical limb ischemia of right lower extremity with rest pain.  The various methods of treatment have been discussed with the patient and family. After consideration of risks, benefits and other options for treatment, the patient has consented to  Procedure(s): ABDOMINAL AORTOGRAM W/LOWER EXTREMITY (N/A) as a surgical intervention.  The patient's history has been reviewed, patient examined, no change in status, stable for surgery.  I have reviewed the patient's chart and labs.  Questions were answered to the patient's satisfaction.     Leonie Douglas

## 2023-04-23 DIAGNOSIS — E1151 Type 2 diabetes mellitus with diabetic peripheral angiopathy without gangrene: Secondary | ICD-10-CM | POA: Diagnosis not present

## 2023-04-23 LAB — GLUCOSE, CAPILLARY: Glucose-Capillary: 151 mg/dL — ABNORMAL HIGH (ref 70–99)

## 2023-04-23 MED ORDER — RIVAROXABAN 20 MG PO TABS: 20.0000 mg | ORAL_TABLET | Freq: Every day | ORAL | 2 refills | Status: DC

## 2023-04-23 NOTE — Discharge Summary (Signed)
Discharge Summary  Patient ID: Alice Bennett [id number] 62 y.o. July 08, 1961  Admit date: 04/22/2023  Discharge date and time: No discharge date for patient encounter.   Admitting Physician: Leonie Douglas, MD   Discharge Physician: same  Admission Diagnoses: Critical lower limb ischemia [facility]) [I70.229]  Discharge Diagnoses: same  Admission Condition: good  Discharged Condition: good  Indication for Admission: observation  Hospital Course: Ms. Alice Bennett is a 62 year old female who was brought in as an outpatient and underwent aortogram with right common and external iliac artery angioplasty and stenting by Dr. Lenell Antu on 04/22/2023 due to critical limb ischemia with thrombosis of the right iliac artery system.  She tolerated the procedure well and was kept in observation overnight.  POD #1 right foot is subjectively better.  She has brisk pedal Doppler signals.  She is ready for discharge home.  She will continue her aspirin and Brilinta daily.  We have added 20 mg of Xarelto to take daily.  She will follow-up in office with aortoiliac duplex and ABIs in 4 to 6 weeks.  She was discharged home in stable condition.  Consults: None  Treatments: surgery: Aortogram with right common and external iliac artery angioplasty and stenting by Dr. Lenell Antu on 04/22/2023  Discharge Exam: See progress note 6/15 Vitals:   04/23/23 0324 04/23/23 0823  BP: (!) 120/44 (!) 137/41  Pulse: 65 60  Resp: 17 17  Temp: 98.7 F (37.1 C) 98.2 F (36.8 C)  SpO2: 100% 100%     Disposition: Discharge disposition: 01-Home or Self Care       Patient Instructions:  Allergies as of 04/23/2023       Reactions   Codeine Itching        Medication List     STOP taking these medications    oxyCODONE-acetaminophen 5-325 MG tablet Commonly known as: PERCOCET/ROXICET       TAKE these medications    aspirin EC 81 MG tablet Take 81 mg by mouth daily.   atorvastatin 80 MG  tablet Commonly known as: LIPITOR Take 1 tablet (80 mg total) by mouth daily at 6 PM.   carvedilol 6.25 MG tablet Commonly known as: COREG Take 1 tablet (6.25 mg total) by mouth 2 (two) times daily with a meal.   cephALEXin 500 MG capsule Commonly known as: KEFLEX Take 500 mg by mouth 3 (three) times daily.   clonazePAM 1 MG tablet Commonly known as: KLONOPIN Take 1 mg by mouth 2 (two) times daily.   D3 High Potency 125 MCG (5000 UT) capsule Generic drug: Cholecalciferol Take 5,000 Units by mouth daily.   ezetimibe 10 MG tablet Commonly known as: ZETIA Take 1 tablet (10 mg total) by mouth daily.   Farxiga 10 MG Tabs tablet Generic drug: dapagliflozin propanediol Take 10 mg by mouth daily.   nitroGLYCERIN 0.4 MG SL tablet Commonly known as: NITROSTAT Place 0.4 mg under the tongue as needed for chest pain.   olmesartan 20 MG tablet Commonly known as: BENICAR Take 1 tablet (20 mg total) by mouth daily.   Ozempic (2 MG/DOSE) 8 MG/3ML Sopn Generic drug: Semaglutide (2 MG/DOSE) Inject 2 mg into the skin once a week.   pantoprazole 40 MG tablet Commonly known as: PROTONIX Take 1 tablet (40 mg total) by mouth daily.   pregabalin 150 MG capsule Commonly known as: LYRICA Take 150 mg by mouth 2 (two) times daily.   rivaroxaban 20 MG Tabs tablet Commonly known as: XARELTO Take 1 tablet (20 mg  total) by mouth daily with supper.   sertraline 100 MG tablet Commonly known as: ZOLOFT Take 100 mg by mouth daily.   spironolactone 25 MG tablet Commonly known as: ALDACTONE Take 25 mg by mouth daily.   ticagrelor 90 MG Tabs tablet Commonly known as: BRILINTA Take 1 tablet (90 mg total) by mouth 2 (two) times daily.   Vascepa 1 g capsule Generic drug: icosapent Ethyl Take 1 capsule (1 g total) by mouth 2 (two) times daily.       Activity: activity as tolerated Diet: regular diet Wound Care: keep wound clean and dry  Follow-up with [facility] in 5  weeks.  Signed: Emilie Rutter, PA-C 04/23/2023 8:40 AM [facility] Office: [phone]

## 2023-04-23 NOTE — TOC Transition Note (Signed)
Transition of Care [facility]) - CM/SW Discharge Note   Patient Details  Name: Alice Bennett MRN: [id number] Date of Birth: December 30, 1960  [facility] CM/SW Contact:  Tom-Johnson, Hershal Coria, RN Phone Number: 04/23/2023, 9:25 AM   Clinical Narrative:     Patient is scheduled for discharge today.  Outpatient referral, hospital f/u and discharge instructions on AVS. No TOC needs or recommendations noted. Family to transport at discharge.  No further TOC needs noted.        Final next level of care: Home/Self Care Barriers to Discharge: Barriers Resolved   Patient Goals and CMS Choice CMS Medicare.gov Compare Post Acute Care list provided to:: Patient Choice offered to / list presented to : NA  Discharge Placement                  Patient to be transferred to facility by: Family      Discharge Plan and Services Additional resources added to the After Visit Summary for                  DME Arranged: N/A DME Agency: NA       HH Arranged: NA HH Agency: NA        Social Determinants of Health (SDOH) Interventions SDOH Screenings   Food Insecurity: No Food Insecurity (07/27/2022)  Housing: Low Risk  (07/27/2022)  Transportation Needs: No Transportation Needs (07/27/2022)  Utilities: Not At Risk (07/27/2022)  Tobacco Use: High Risk (04/21/2023)     Readmission Risk Interventions     No data to display

## 2023-04-23 NOTE — Progress Notes (Addendum)
  Progress Note    04/23/2023 8:16 AM 1 Day Post-Op  Subjective:  R foot feels much better compared to before intervention   Vitals:   04/22/23 2337 04/23/23 0324  BP: (!) 142/50 (!) 120/44  Pulse: 60 65  Resp: 15 17  Temp: 98.6 F (37 C) 98.7 F (37.1 C)  SpO2: 100% 100%   Physical Exam: Lungs:  non labored Incisions:  B groins without hematoma Extremities:  R PT and DP brisk by doppler Abdomen:  soft, NT, ND Neurologic: A&O  CBC    Component Value Date/Time   WBC 10.0 02/06/2023 0126   RBC 4.23 02/06/2023 0126   HGB 12.6 04/22/2023 0858   HGB 13.8 07/07/2022 1531   HCT 37.0 04/22/2023 0858   HCT 41.8 07/07/2022 1531   PLT 153 02/06/2023 0126   PLT 192 07/07/2022 1531   MCV 85.6 02/06/2023 0126   MCV 86 07/07/2022 1531   MCH 29.3 02/06/2023 0126   MCHC 34.3 02/06/2023 0126   RDW 13.7 02/06/2023 0126   RDW 14.2 07/07/2022 1531   LYMPHSABS 1.1 02/06/2023 0126   MONOABS 0.5 02/06/2023 0126   EOSABS 0.0 02/06/2023 0126   BASOSABS 0.0 02/06/2023 0126    BMET    Component Value Date/Time   NA 143 04/22/2023 0858   NA 142 07/07/2022 1531   K 3.3 (L) 04/22/2023 0858   CL 106 04/22/2023 0858   CO2 26 02/06/2023 0126   GLUCOSE 159 (H) 04/22/2023 0858   BUN 9 04/22/2023 0858   BUN 8 07/07/2022 1531   CREATININE 0.80 04/22/2023 0858   CALCIUM 8.8 (L) 02/06/2023 0126   GFRNONAA >60 02/06/2023 0126   GFRAA 53 (L) 06/09/2020 0331    INR    Component Value Date/Time   INR 0.93 07/10/2017 1106     Intake/Output Summary (Last 24 hours) at 04/23/2023 0816 Last data filed at 04/23/2023 0327 Gross per 24 hour  Intake 1234.72 ml  Output 200 ml  Net 1034.72 ml     Assessment/Plan:  62 y.o. female is s/p R common and external iliac artery angioplasty and stenting 1 Day Post-Op   R foot subjectively feeling better after revascularization.  R foot well perfused with brisk PT and DP by doppler.  Groin cath sites without hematoma.  Continue aspirin, brillinta  statin.  Xarelto added.  Ok for discharge home.  Office will arrange AOI duplex and ABI in 4-6 weeks.   Emilie Rutter, PA-C [facility] [phone] 04/23/2023 8:16 AM   VASCULAR STAFF ADDENDUM: I have independently interviewed and examined the patient. I agree with the above.  Reviewed angiogram details with her. Counseled patient that if she continues to smoke her options for saving her leg will become more and more limited and she will likely lose her leg.  She did not seem to understand. Maximal medical therapy to reduce risk of thrombosis.  Aspirin, Brilinta, Xarelto will be added.  High intensity statin therapy necessary. Follow-up with me in 4 weeks with ABI and aortoiliac duplex  Rande Brunt. Lenell Antu, MD [facility] Phone Number: [phone] 04/23/2023 9:44 AM

## 2023-04-23 NOTE — Progress Notes (Signed)
Discharge education reviewed with pt and daughter.  Pt transported home by daughter.

## 2023-04-23 NOTE — Discharge Instructions (Signed)
° °  [facility] [place] ° °Discharge Instructions ° °Lower Extremity Angiogram; Angioplasty/Stenting ° °Please refer to the following instructions for your post-procedure care. Your surgeon or physician assistant will discuss any changes with you. ° °Activity ° °Avoid lifting more than 8 pounds (1 gallons of milk) for 72 hours (3 days) after your procedure. You may walk as much as you can tolerate. It's OK to drive after 72 hours. ° °Bathing/Showering ° °You may shower the day after your procedure. If you have a bandage, you may remove it at 24- 48 hours. Clean your incision site with mild soap and water. Pat the area dry with a clean towel. ° °Diet ° °Resume your pre-procedure diet. There are no special food restrictions following this procedure. All patients with peripheral vascular disease should follow a low fat/low cholesterol diet. In order to heal from your surgery, it is CRITICAL to get adequate nutrition. Your body requires vitamins, minerals, and protein. Vegetables are the best source of vitamins and minerals. Vegetables also provide the perfect balance of protein. Processed food has little nutritional value, so try to avoid this. ° °Medications ° °Resume taking all of your medications unless your doctor tells you not to. If your incision is causing pain, you may take over-the-counter pain relievers such as acetaminophen (Tylenol) ° °Follow Up ° °Follow up will be arranged at the time of your procedure. You may have an office visit scheduled or may be scheduled for surgery. Ask your surgeon if you have any questions. ° °Please call us immediately for any of the following conditions: °•Severe or worsening pain your legs or feet at rest or with walking. °•Increased pain, redness, drainage at your groin puncture site. °•Fever of 101 degrees or higher. °•If you have any mild or slow bleeding from your puncture site: lie down, apply firm constant pressure over the area with a piece of  gauze or a clean wash cloth for 30 minutes- no peeking!, call 911 right away if you are still bleeding after 30 minutes, or if the bleeding is heavy and unmanageable. ° °Reduce your risk factors of vascular disease: ° °Stop smoking. If you would like help call [facility] at 1-800-QUIT-NOW (1-800-784-8669) or [place] at 336-586-4000. °Manage your cholesterol °Maintain a desired weight °Control your diabetes °Keep your blood pressure down ° °If you have any questions, please call the office at [phone] ° °

## 2023-04-25 ENCOUNTER — Encounter (HOSPITAL_COMMUNITY): Payer: Self-pay | Admitting: Vascular Surgery

## 2023-04-25 ENCOUNTER — Telehealth: Payer: Self-pay | Admitting: Surgery

## 2023-04-25 NOTE — Telephone Encounter (Signed)
Lvm  to schedule post op appt.

## 2023-04-25 NOTE — Telephone Encounter (Signed)
-----   Message from Emilie Rutter, PA-C sent at 04/23/2023  8:14 AM EDT ----- 4-6 weeks with aortoiliac duplex and ABI with [facility]ham or PA.  PO R CIA and EIA stenting Thanks

## 2023-04-27 ENCOUNTER — Telehealth: Payer: Self-pay

## 2023-04-27 NOTE — Telephone Encounter (Signed)
Pt called in to triage line with c/o R foot swelling, 10/10 constant R LE pain and discoloration of R toes. Pt stated that she keeps her leg/foot elevated above the level of heart when able and does not have anything to take for pain. Denied fever/chills,cold leg/foot and SOB. Advised that if her pain in that severe to go to the ED. Pt declined. Appointment made for office visit on 6/21. Pt made aware that if she shows sign of infection (fever/chills, drainage from incision, temperature changes to RLE ) to give our office or go to the nearest ED. Pt verbalized understanding.

## 2023-04-29 ENCOUNTER — Ambulatory Visit (INDEPENDENT_AMBULATORY_CARE_PROVIDER_SITE_OTHER): Payer: Medicaid Other | Admitting: Physician Assistant

## 2023-04-29 ENCOUNTER — Inpatient Hospital Stay (HOSPITAL_COMMUNITY): Payer: Medicaid Other

## 2023-04-29 ENCOUNTER — Other Ambulatory Visit: Payer: Self-pay | Admitting: Physician Assistant

## 2023-04-29 ENCOUNTER — Other Ambulatory Visit: Payer: Self-pay

## 2023-04-29 ENCOUNTER — Inpatient Hospital Stay (HOSPITAL_COMMUNITY)
Admission: AD | Admit: 2023-04-29 | Discharge: 2023-05-06 | DRG: 253 | Disposition: A | Discharge status: Home / Self Care | Payer: Medicaid Other | Source: Ambulatory Visit | Attending: Surgery | Admitting: Surgery

## 2023-04-29 VITALS — BP 179/91 | HR 48 | Temp 97.6°F | Resp 16 | Ht 69.0 in | Wt 220.0 lb

## 2023-04-29 DIAGNOSIS — I70221 Atherosclerosis of native arteries of extremities with rest pain, right leg: Secondary | ICD-10-CM

## 2023-04-29 DIAGNOSIS — E785 Hyperlipidemia, unspecified: Secondary | ICD-10-CM | POA: Diagnosis present

## 2023-04-29 DIAGNOSIS — T82856A Stenosis of peripheral vascular stent, initial encounter: Principal | ICD-10-CM | POA: Diagnosis present

## 2023-04-29 DIAGNOSIS — L97519 Non-pressure chronic ulcer of other part of right foot with unspecified severity: Secondary | ICD-10-CM | POA: Diagnosis present

## 2023-04-29 DIAGNOSIS — N390 Urinary tract infection, site not specified: Secondary | ICD-10-CM | POA: Diagnosis present

## 2023-04-29 DIAGNOSIS — Z7982 Long term (current) use of aspirin: Secondary | ICD-10-CM | POA: Diagnosis not present

## 2023-04-29 DIAGNOSIS — W500XXA Accidental hit or strike by another person, initial encounter: Secondary | ICD-10-CM | POA: Diagnosis present

## 2023-04-29 DIAGNOSIS — Z7901 Long term (current) use of anticoagulants: Secondary | ICD-10-CM | POA: Diagnosis not present

## 2023-04-29 DIAGNOSIS — Z7985 Long-term (current) use of injectable non-insulin antidiabetic drugs: Secondary | ICD-10-CM

## 2023-04-29 DIAGNOSIS — I70235 Atherosclerosis of native arteries of right leg with ulceration of other part of foot: Secondary | ICD-10-CM | POA: Diagnosis present

## 2023-04-29 DIAGNOSIS — Z955 Presence of coronary angioplasty implant and graft: Secondary | ICD-10-CM

## 2023-04-29 DIAGNOSIS — Z7902 Long term (current) use of antithrombotics/antiplatelets: Secondary | ICD-10-CM

## 2023-04-29 DIAGNOSIS — M79671 Pain in right foot: Secondary | ICD-10-CM | POA: Diagnosis present

## 2023-04-29 DIAGNOSIS — Z6832 Body mass index (BMI) 32.0-32.9, adult: Secondary | ICD-10-CM

## 2023-04-29 DIAGNOSIS — I252 Old myocardial infarction: Secondary | ICD-10-CM | POA: Diagnosis not present

## 2023-04-29 DIAGNOSIS — I2510 Atherosclerotic heart disease of native coronary artery without angina pectoris: Secondary | ICD-10-CM | POA: Diagnosis present

## 2023-04-29 DIAGNOSIS — Z885 Allergy status to narcotic agent status: Secondary | ICD-10-CM

## 2023-04-29 DIAGNOSIS — Z79899 Other long term (current) drug therapy: Secondary | ICD-10-CM | POA: Diagnosis not present

## 2023-04-29 DIAGNOSIS — E1165 Type 2 diabetes mellitus with hyperglycemia: Secondary | ICD-10-CM | POA: Diagnosis present

## 2023-04-29 DIAGNOSIS — E1151 Type 2 diabetes mellitus with diabetic peripheral angiopathy without gangrene: Secondary | ICD-10-CM | POA: Diagnosis present

## 2023-04-29 DIAGNOSIS — I998 Other disorder of circulatory system: Secondary | ICD-10-CM | POA: Diagnosis not present

## 2023-04-29 DIAGNOSIS — Z7984 Long term (current) use of oral hypoglycemic drugs: Secondary | ICD-10-CM

## 2023-04-29 DIAGNOSIS — I10 Essential (primary) hypertension: Secondary | ICD-10-CM | POA: Diagnosis present

## 2023-04-29 DIAGNOSIS — I70229 Atherosclerosis of native arteries of extremities with rest pain, unspecified extremity: Principal | ICD-10-CM | POA: Diagnosis present

## 2023-04-29 DIAGNOSIS — F17210 Nicotine dependence, cigarettes, uncomplicated: Secondary | ICD-10-CM | POA: Diagnosis present

## 2023-04-29 DIAGNOSIS — E11621 Type 2 diabetes mellitus with foot ulcer: Secondary | ICD-10-CM | POA: Diagnosis present

## 2023-04-29 DIAGNOSIS — Y838 Other surgical procedures as the cause of abnormal reaction of the patient, or of later complication, without mention of misadventure at the time of the procedure: Secondary | ICD-10-CM | POA: Diagnosis present

## 2023-04-29 LAB — BASIC METABOLIC PANEL
Anion gap: 11 (ref 5–15)
BUN: 9 mg/dL (ref 8–23)
CO2: 24 mmol/L (ref 22–32)
Calcium: 8.8 mg/dL — ABNORMAL LOW (ref 8.9–10.3)
Chloride: 102 mmol/L (ref 98–111)
Creatinine, Ser: 0.83 mg/dL (ref 0.44–1.00)
GFR, Estimated: >60 mL/min (ref >60)
Glucose, Bld: 241 mg/dL — ABNORMAL HIGH (ref 70–99)
Potassium: 3.3 mmol/L — ABNORMAL LOW (ref 3.5–5.1)
Sodium: 137 mmol/L (ref 135–145)

## 2023-04-29 LAB — CBC
HCT: 35.1 % — ABNORMAL LOW (ref 36.0–46.0)
Hemoglobin: 11.4 g/dL — ABNORMAL LOW (ref 12.0–15.0)
MCH: 27.5 pg (ref 26.0–34.0)
MCHC: 32.5 g/dL (ref 30.0–36.0)
MCV: 84.8 fL (ref 80.0–100.0)
Platelets: 342 10*3/uL (ref 150–400)
RBC: 4.14 MIL/uL (ref 3.87–5.11)
RDW: 14 % (ref 11.5–15.5)
WBC: 9.5 10*3/uL (ref 4.0–10.5)
nRBC: 0 % (ref 0.0–0.2)

## 2023-04-29 LAB — APTT: aPTT: 52 seconds — ABNORMAL HIGH (ref 24–36)

## 2023-04-29 LAB — HEPARIN LEVEL (UNFRACTIONATED): Heparin Unfractionated: >1.1 IU/mL — ABNORMAL HIGH (ref 0.30–0.70)

## 2023-04-29 MED ORDER — IOHEXOL 350 MG/ML SOLN
100.0000 mL | Freq: Once | INTRAVENOUS | Status: AC | PRN
  Administered 2023-04-29: 100 mL via INTRAVENOUS

## 2023-04-29 MED ORDER — CLONAZEPAM 0.5 MG PO TABS
1.0000 mg | ORAL_TABLET | Freq: Two times a day (BID) | ORAL | Status: DC | PRN
  Administered 2023-04-29 – 2023-05-05 (×3): 1 mg via ORAL
  Filled 2023-04-29 (×3): qty 2

## 2023-04-29 MED ORDER — IRBESARTAN 150 MG PO TABS
150.0000 mg | ORAL_TABLET | Freq: Every day | ORAL | Status: DC
  Administered 2023-04-30 – 2023-05-06 (×6): 150 mg via ORAL
  Filled 2023-04-29 (×6): qty 1

## 2023-04-29 MED ORDER — ALUM & MAG HYDROXIDE-SIMETH 200-200-20 MG/5ML PO SUSP
15.0000 mL | ORAL | Status: DC | PRN
  Administered 2023-05-03: 30 mL via ORAL
  Filled 2023-04-29: qty 30

## 2023-04-29 MED ORDER — ONDANSETRON HCL 4 MG/2ML IJ SOLN: 4.0000 mg | Freq: Four times a day (QID) | INTRAMUSCULAR | Status: DC | PRN

## 2023-04-29 MED ORDER — HYDROMORPHONE HCL 1 MG/ML IJ SOLN: 0.5000 mg | INTRAMUSCULAR | Status: DC | PRN

## 2023-04-29 MED ORDER — ACETAMINOPHEN 325 MG PO TABS
325.0000 mg | ORAL_TABLET | ORAL | Status: DC | PRN
  Administered 2023-05-02 (×2): 650 mg via ORAL
  Filled 2023-04-29 (×2): qty 2

## 2023-04-29 MED ORDER — PREGABALIN 25 MG PO CAPS
150.0000 mg | ORAL_CAPSULE | Freq: Two times a day (BID) | ORAL | Status: DC
  Administered 2023-04-29 – 2023-05-06 (×13): 150 mg via ORAL
  Filled 2023-04-29 (×13): qty 2

## 2023-04-29 MED ORDER — ACETAMINOPHEN 650 MG RE SUPP: 325.0000 mg | RECTAL | Status: DC | PRN

## 2023-04-29 MED ORDER — GUAIFENESIN-DM 100-10 MG/5ML PO SYRP: 15.0000 mL | ORAL_SOLUTION | ORAL | Status: DC | PRN

## 2023-04-29 MED ORDER — HEPARIN (PORCINE) 25000 UT/250ML-% IV SOLN
1700.0000 [IU]/h | INTRAVENOUS | Status: DC
  Administered 2023-04-29 – 2023-05-04 (×7): 1700 [IU]/h via INTRAVENOUS
  Filled 2023-04-29 (×7): qty 250

## 2023-04-29 MED ORDER — SERTRALINE HCL 100 MG PO TABS
100.0000 mg | ORAL_TABLET | Freq: Every day | ORAL | Status: DC
  Administered 2023-04-30 – 2023-05-06 (×6): 100 mg via ORAL
  Filled 2023-04-29 (×6): qty 1

## 2023-04-29 MED ORDER — EZETIMIBE 10 MG PO TABS
10.0000 mg | ORAL_TABLET | Freq: Every day | ORAL | Status: DC
  Administered 2023-04-30 – 2023-05-06 (×6): 10 mg via ORAL
  Filled 2023-04-29 (×6): qty 1

## 2023-04-29 MED ORDER — METOPROLOL TARTRATE 5 MG/5ML IV SOLN: 2.0000 mg | INTRAVENOUS | Status: DC | PRN

## 2023-04-29 MED ORDER — INSULIN ASPART 100 UNIT/ML IJ SOLN
0.0000 [IU] | Freq: Three times a day (TID) | INTRAMUSCULAR | Status: DC
  Administered 2023-04-30: 3 [IU] via SUBCUTANEOUS
  Administered 2023-04-30: 2 [IU] via SUBCUTANEOUS
  Administered 2023-04-30 – 2023-05-01 (×2): 3 [IU] via SUBCUTANEOUS

## 2023-04-29 MED ORDER — CARVEDILOL 6.25 MG PO TABS
6.2500 mg | ORAL_TABLET | Freq: Two times a day (BID) | ORAL | Status: DC
  Administered 2023-04-29 – 2023-05-03 (×9): 6.25 mg via ORAL
  Filled 2023-04-29 (×10): qty 1

## 2023-04-29 MED ORDER — PANTOPRAZOLE SODIUM 40 MG PO TBEC
40.0000 mg | DELAYED_RELEASE_TABLET | Freq: Every day | ORAL | Status: DC
  Administered 2023-04-30 – 2023-05-06 (×6): 40 mg via ORAL
  Filled 2023-04-29 (×6): qty 1

## 2023-04-29 MED ORDER — OXYCODONE-ACETAMINOPHEN 5-325 MG PO TABS
1.0000 | ORAL_TABLET | ORAL | Status: DC | PRN
  Administered 2023-04-29 – 2023-05-03 (×13): 2 via ORAL
  Administered 2023-05-04: 1 via ORAL
  Administered 2023-05-04 – 2023-05-06 (×6): 2 via ORAL
  Filled 2023-04-29 (×20): qty 2

## 2023-04-29 MED ORDER — LABETALOL HCL 5 MG/ML IV SOLN: 10.0000 mg | INTRAVENOUS | Status: DC | PRN

## 2023-04-29 MED ORDER — ATORVASTATIN CALCIUM 80 MG PO TABS
80.0000 mg | ORAL_TABLET | Freq: Every day | ORAL | Status: DC
  Administered 2023-04-29 – 2023-05-05 (×7): 80 mg via ORAL
  Filled 2023-04-29 (×7): qty 1

## 2023-04-29 MED ORDER — ASPIRIN 81 MG PO TBEC
81.0000 mg | DELAYED_RELEASE_TABLET | Freq: Every day | ORAL | Status: DC
  Administered 2023-04-29 – 2023-05-06 (×7): 81 mg via ORAL
  Filled 2023-04-29 (×7): qty 1

## 2023-04-29 MED ORDER — SPIRONOLACTONE 25 MG PO TABS
25.0000 mg | ORAL_TABLET | Freq: Every day | ORAL | Status: DC
  Administered 2023-04-30 – 2023-05-06 (×6): 25 mg via ORAL
  Filled 2023-04-29 (×6): qty 1

## 2023-04-29 MED ORDER — PHENOL 1.4 % MT LIQD: 1.0000 | OROMUCOSAL | Status: DC | PRN

## 2023-04-29 MED ORDER — HYDRALAZINE HCL 20 MG/ML IJ SOLN: 5.0000 mg | INTRAMUSCULAR | Status: DC | PRN

## 2023-04-29 NOTE — Progress Notes (Signed)
Office Note     CC:  follow up Requesting Provider:  Erskine Emery, NP  HPI: Alice Bennett is a 62 y.o. (03-16-1961) female who presents to office as an urgent add-on.  She is status post right common and external iliac artery stenting by Dr. Lenell Antu on 04/22/2023 due to thrombosed right iliac system with rest pain and toe discoloration of the right foot.  Previously she had right common iliac artery stenting by Dr. Gery Pray in September 2023.  This stent thrombosed and she underwent percutaneous thrombectomy and stenting of the right iliac by Dr. Myra Gianotti in March 2024.  She is accompanied by her daughter today.  She states since the day after discharge from the hospital last week the pain returned into her right foot.  She has noticed tissue changes of all toes of the right foot as well as her right heel.  She has been taking her aspirin, Brilinta, and Xarelto daily.  She continues to smoke about 1 to 2 cigarettes/day.  She is experiencing rest pain every night as well as numbness in her right foot.  Past medical history also significant for 3 coronary events requiring stenting.  She has diabetes mellitus with 6.7 hemoglobin A1c as of March of this year.   Past Medical History:  Diagnosis Date   CAD (coronary artery disease)    07/10/17 PCI/DES to LAD, EF 35-40%   Diabetes mellitus without complication (HCC)    Hyperlipidemia    Hypertension    NSTEMI (non-ST elevated myocardial infarction) [facility])     Past Surgical History:  Procedure Laterality Date   ABDOMINAL AORTOGRAM W/LOWER EXTREMITY N/A 07/26/2022   Procedure: ABDOMINAL AORTOGRAM W/LOWER EXTREMITY;  Surgeon: Runell Gess, MD;  Location: [facility] INVASIVE CV LAB;  Service: Cardiovascular;  Laterality: N/A;   ABDOMINAL AORTOGRAM W/LOWER EXTREMITY N/A 04/22/2023   Procedure: ABDOMINAL AORTOGRAM W/LOWER EXTREMITY;  Surgeon: Leonie Douglas, MD;  Location: [facility] IN[facility] CV [facility];  Service: Cardiovascular;  Laterality: N/A;   AORTOGRAM Bilateral  02/05/2023   Procedure: AORTOGRAM WITH RUN OFF;  Surgeon: Nada Libman, MD;  Location: [facility] OR;  Service: Vascular;  Laterality: Bilateral;   CORONARY PRESSURE/FFR STUDY N/A 07/10/2017   Procedure: INTRAVASCULAR PRESSURE WIRE/FFR STUDY;  Surgeon: Swaziland, Peter M, MD;  Location: [facility] INVASIVE CV LAB;  Service: Cardiovascular;  Laterality: N/A;   CORONARY PRESSURE/FFR STUDY N/A 05/31/2022   Procedure: INTRAVASCULAR PRESSURE WIRE/FFR STUDY;  Surgeon: Yvonne Kendall, MD;  Location: [facility];  Service: Cardiovascular;  Laterality: N/A;   CORONARY STENT INTERVENTION N/A 07/10/2017   Procedure: CORONARY STENT INTERVENTION;  Surgeon: Swaziland, Peter M, MD;  Location: [facility] INVASIVE CV LAB;  Service: Cardiovascular;  Laterality: N/A;   CORONARY STENT INTERVENTION N/A 05/31/2022   Procedure: CORONARY STENT INTERVENTION;  Surgeon: Yvonne Kendall, MD;  Location: [facility] CV [facility];  Service: Cardiovascular;  Laterality: N/A;   INSERTION OF ILIAC STENT Right 02/05/2023   Procedure: INSERTION OF RIGHT ILIAC STENT;  Surgeon: Nada Libman, MD;  Location: [facility] OR;  Service: Vascular;  Laterality: Right;   LEFT HEART CATH AND CORONARY ANGIOGRAPHY N/A 07/10/2017   Procedure: LEFT HEART CATH AND CORONARY ANGIOGRAPHY;  Surgeon: Swaziland, Peter M, MD;  Location: [facility] INVASIVE CV LAB;  Service: Cardiovascular;  Laterality: N/A;   LOWER EXTREMITY ANGIOGRAM N/A 02/05/2023   Procedure: RIGHT LOWER EXTREMITY ANGIOGRAM;  Surgeon: Nada Libman, MD;  Location: [facility] OR;  Service: Vascular;  Laterality: N/A;   PERIPHERAL VASCULAR INTERVENTION  07/26/2022  Procedure: PERIPHERAL VASCULAR INTERVENTION;  Surgeon: Runell Gess, MD;  Location: [facility] INVASIVE CV LAB;  Service: Cardiovascular;;   PERIPHERAL VASCULAR INTERVENTION  04/22/2023   Procedure: PERIPHERAL VASCULAR INTERVENTION;  Surgeon: Leonie Douglas, MD;  Location: [facility];  Service: Cardiovascular;;  right common and ecternal iliac, right common femoral    RIGHT/LEFT HEART CATH AND CORONARY ANGIOGRAPHY N/A 05/31/2022   Procedure: RIGHT/LEFT HEART CATH AND CORONARY ANGIOGRAPHY;  Surgeon: Yvonne Kendall, MD;  Location: [facility];  Service: Cardiovascular;  Laterality: N/A;   ULTRASOUND GUIDANCE FOR VASCULAR ACCESS Bilateral 02/05/2023   Procedure: ULTRASOUND GUIDANCE FOR VASCULAR ACCESS;  Surgeon: Nada Libman, MD;  Location: [facility] OR;  Service: Vascular;  Laterality: Bilateral;    Social History   Socioeconomic History   Marital status: Single    Spouse name: Not on file   Number of children: Not on file   Years of education: Not on file   Highest education level: Not on file  Occupational History   Not on file  Tobacco Use   Smoking status: Every Day    Packs/day: 0.50    Years: 10.00    Additional pack years: 0.00    Total pack years: 5.00    Types: Cigarettes   Smokeless tobacco: Never  Vaping Use   Vaping Use: Never used  Substance and Sexual Activity   Alcohol use: No   Drug use: No   Sexual activity: Not on file  Other Topics Concern   Not on file  Social History Narrative   Not on file   Social Determinants of Health   Financial Resource Strain: Not on file  Food Insecurity: No Food Insecurity (07/27/2022)   Hunger Vital Sign    Worried About Running Out of Food in the Last Year: Never true    Ran Out of Food in the Last Year: Never true  Transportation Needs: No Transportation Needs (07/27/2022)   PRAPARE - Administrator, Civil Service (Medical): No    Lack of Transportation (Non-Medical): No  Physical Activity: Not on file  Stress: Not on file  Social Connections: Not on file  Intimate Partner Violence: Not At Risk (07/27/2022)   Humiliation, Afraid, Rape, and Kick questionnaire    Fear of Current or Ex-Partner: No    Emotionally Abused: No    Physically Abused: No    Sexually Abused: No   History reviewed. No pertinent family history.  Current Outpatient Medications  Medication Sig  Dispense Refill   aspirin EC 81 MG tablet Take 81 mg by mouth daily.     atorvastatin (LIPITOR) 80 MG tablet Take 1 tablet (80 mg total) by mouth daily at 6 PM. 30 tablet 0   carvedilol (COREG) 6.25 MG tablet Take 1 tablet (6.25 mg total) by mouth 2 (two) times daily with a meal. 60 tablet 0   cephALEXin (KEFLEX) 500 MG capsule Take 500 mg by mouth 3 (three) times daily.     Cholecalciferol (D3 HIGH POTENCY) 125 MCG (5000 UT) capsule Take 5,000 Units by mouth daily.     clonazePAM (KLONOPIN) 1 MG tablet Take 1 mg by mouth 2 (two) times daily.     ezetimibe (ZETIA) 10 MG tablet Take 1 tablet (10 mg total) by mouth daily. 90 tablet 3   FARXIGA 10 MG TABS tablet Take 10 mg by mouth daily.      olmesartan (BENICAR) 20 MG tablet Take 1 tablet (20 mg total) by mouth daily.  90 tablet 3   OZEMPIC, 2 MG/DOSE, 8 MG/3ML SOPN Inject 2 mg into the skin once a week.     pantoprazole (PROTONIX) 40 MG tablet Take 1 tablet (40 mg total) by mouth daily. 30 tablet 0   pregabalin (LYRICA) 150 MG capsule Take 150 mg by mouth 2 (two) times daily.     rivaroxaban (XARELTO) 20 MG TABS tablet Take 1 tablet (20 mg total) by mouth daily with supper. 30 tablet 2   sertraline (ZOLOFT) 100 MG tablet Take 100 mg by mouth daily.     spironolactone (ALDACTONE) 25 MG tablet Take 25 mg by mouth daily.     ticagrelor (BRILINTA) 90 MG TABS tablet Take 1 tablet (90 mg total) by mouth 2 (two) times daily. 60 tablet 2   VASCEPA 1 g capsule Take 1 capsule (1 g total) by mouth 2 (two) times daily. 180 capsule 3   nitroGLYCERIN (NITROSTAT) 0.4 MG SL tablet Place 0.4 mg under the tongue as needed for chest pain. (Patient not taking: Reported on 04/29/2023)     No current facility-administered medications for this visit.    Allergies  Allergen Reactions   Codeine Itching     REVIEW OF SYSTEMS:   [X]  denotes positive finding, [ ]  denotes negative finding Cardiac  Comments:  Chest pain or chest pressure:    Shortness of breath  upon exertion:    Short of breath when lying flat:    Irregular heart rhythm:        Vascular    Pain in calf, thigh, or hip brought on by ambulation:    Pain in feet at night that wakes you up from your sleep:     Blood clot in your veins:    Leg swelling:         Pulmonary    Oxygen at home:    Productive cough:     Wheezing:         Neurologic    Sudden weakness in arms or legs:     Sudden numbness in arms or legs:     Sudden onset of difficulty speaking or slurred speech:    Temporary loss of vision in one eye:     Problems with dizziness:         Gastrointestinal    Blood in stool:     Vomited blood:         Genitourinary    Burning when urinating:     Blood in urine:        Psychiatric    Major depression:         Hematologic    Bleeding problems:    Problems with blood clotting too easily:        Skin    Rashes or ulcers:        Constitutional    Fever or chills:      PHYSICAL EXAMINATION:  Vitals:   04/29/23 0937  BP: (!) 179/91  Pulse: (!) 48  Resp: 16  Temp: 97.6 F (36.4 C)  TempSrc: Temporal  SpO2: 94%  Weight: 220 lb (99.8 kg)  Height: 5\' 9"  (1.753 m)    General:  WDWN in NAD; vital signs documented above Gait: Not observed HENT: WNL, normocephalic Pulmonary: normal non-labored breathing , without Rales, rhonchi,  wheezing Cardiac: regular HR Abdomen: soft, NT, no masses Skin: without rashes Vascular Exam/Pulses: Palpable left femoral pulse, palpable left PT pulse; unable to palpate right femoral pulse, soft monophasic right DP signal  by Doppler Extremities: Tissue discoloration of all toes of the right foot and right lateral foot and heel with tenderness to touch; numbness to touch right foot; motor exam limited due to pain; left great toe tip also discolored Musculoskeletal: no muscle wasting or atrophy  Neurologic: A&O X 3 Psychiatric:  The pt has Normal affect.   Non-Invasive Vascular Imaging:   none    ASSESSMENT/PLAN::  62 y.o. female status post restenting of the right common and external iliac artery by Dr. Lenell Antu on 04/22/2023   -Ms. Malaya Cagley is a 62 year old female who underwent right, iliac artery stenting by Dr. York Ram in September 2023.  This stent thrombosed and she underwent percutaneous thrombectomy with stenting by Dr. Myra Gianotti in March of this year.  3 months later the stents thrombosed and she underwent restenting of the iliac system by Dr. Lenell Antu 1 week ago.  She was started on Xarelto in addition to her aspirin and Brilinta.  Unfortunately patient states the day after discharge from the hospital rest pain and tissue changes returned to her right foot.  On exam she has discolored toes of the right foot as well as right lateral foot and heel.  She only has a monophasic signal in her AT/DP which likely represents thrombosis of the right iliac system.  She has numbness and motor exam is limited due to pain.  Dr. Myra Gianotti discussed options for revascularization due to this being a limb threatening situation.  She would likely not tolerate aortobifemoral bypass given her coronary history as well as morbid obesity.  She will be scheduled for left or right femoral to femoral bypass graft with Dr. Myra Gianotti on Wednesday.  She will be admitted to the hospital today for anticoagulation washout, pain control, and initiation of IV heparin.  The patient and her daughter are agreeable to admission today with plans for surgery on Wednesday.  We will also obtain a CTA aorta with runoff to help with plans for revascularization.   Emilie Rutter, PA-C [facility] [phone]  Clinic MD:   Myra Gianotti

## 2023-04-29 NOTE — Progress Notes (Signed)
ANTICOAGULATION CONSULT NOTE - Initial Consult  Pharmacy Consult for heparin  Indication:  PAD  Allergies  Allergen Reactions   Codeine Itching    Patient Measurements:   Heparin Dosing Weight: 88kg  Vital Signs: Temp: 97.6 F (36.4 C) (06/21 0937) Temp Source: Temporal (06/21 0937) BP: 179/91 (06/21 0937) Pulse Rate: 48 (06/21 0937)  Labs: Recent Labs    04/29/23 1155  HGB 11.4*  HCT 35.1*  PLT 342    Estimated Creatinine Clearance: 92.8 mL/min (by C-G formula based on SCr of 0.8 mg/dL).   Medical History: Past Medical History:  Diagnosis Date   CAD (coronary artery disease)    07/10/17 PCI/DES to LAD, EF 35-40%   Diabetes mellitus without complication (HCC)    Hyperlipidemia    Hypertension    NSTEMI (non-ST elevated myocardial infarction) (HCC)     Medications:  Medications Prior to Admission  Medication Sig Dispense Refill Last Dose   aspirin EC 81 MG tablet Take 81 mg by mouth daily.      atorvastatin (LIPITOR) 80 MG tablet Take 1 tablet (80 mg total) by mouth daily at 6 PM. 30 tablet 0    carvedilol (COREG) 6.25 MG tablet Take 1 tablet (6.25 mg total) by mouth 2 (two) times daily with a meal. 60 tablet 0    cephALEXin (KEFLEX) 500 MG capsule Take 500 mg by mouth 3 (three) times daily.      Cholecalciferol (D3 HIGH POTENCY) 125 MCG (5000 UT) capsule Take 5,000 Units by mouth daily.      clonazePAM (KLONOPIN) 1 MG tablet Take 1 mg by mouth 2 (two) times daily.      ezetimibe (ZETIA) 10 MG tablet Take 1 tablet (10 mg total) by mouth daily. 90 tablet 3    FARXIGA 10 MG TABS tablet Take 10 mg by mouth daily.       nitroGLYCERIN (NITROSTAT) 0.4 MG SL tablet Place 0.4 mg under the tongue as needed for chest pain. (Patient not taking: Reported on 04/29/2023)      olmesartan (BENICAR) 20 MG tablet Take 1 tablet (20 mg total) by mouth daily. 90 tablet 3    OZEMPIC, 2 MG/DOSE, 8 MG/3ML SOPN Inject 2 mg into the skin once a week.      pantoprazole (PROTONIX) 40 MG  tablet Take 1 tablet (40 mg total) by mouth daily. 30 tablet 0    pregabalin (LYRICA) 150 MG capsule Take 150 mg by mouth 2 (two) times daily.      rivaroxaban (XARELTO) 20 MG TABS tablet Take 1 tablet (20 mg total) by mouth daily with supper. 30 tablet 2    sertraline (ZOLOFT) 100 MG tablet Take 100 mg by mouth daily.      spironolactone (ALDACTONE) 25 MG tablet Take 25 mg by mouth daily.      ticagrelor (BRILINTA) 90 MG TABS tablet Take 1 tablet (90 mg total) by mouth 2 (two) times daily. 60 tablet 2    VASCEPA 1 g capsule Take 1 capsule (1 g total) by mouth 2 (two) times daily. 180 capsule 3    Scheduled:   aspirin EC  81 mg Oral Daily   atorvastatin  80 mg Oral q1800   carvedilol  6.25 mg Oral BID WC   ezetimibe  10 mg Oral Daily   insulin aspart  0-15 Units Subcutaneous TID WC   irbesartan  150 mg Oral Daily   pantoprazole  40 mg Oral Daily   pregabalin  150 mg Oral BID  sertraline  100 mg Oral Daily   spironolactone  25 mg Oral Daily   Infusions:   heparin      Assessment: Pt with recent vascular surgery presented with fool pain and numbness. She had her Xarelto this AM. Plan to bridge with heparin until revascularization next Wed. We will use aPTT until washout with Xarelto. Will use previous heparin rate during her last admission.   Bmet and CBC pending  Goal of Therapy:  Heparin level 0.3-0.7 units/ml aPTT 66-102 seconds Monitor platelets by anticoagulation protocol: Yes   Plan:  Baseline aPTT and HL Start heparin at 1700 units/hr at 8pm Check aPTT/HL in AM then daily Daily CBC  Ulyses Southward, PharmD, BCIDP, AAHIVP, CPP Infectious Disease Pharmacist 04/29/2023 12:31 PM   C

## 2023-04-30 LAB — CBC
HCT: 34.5 % — ABNORMAL LOW (ref 36.0–46.0)
Hemoglobin: 11 g/dL — ABNORMAL LOW (ref 12.0–15.0)
MCH: 27.1 pg (ref 26.0–34.0)
MCHC: 31.9 g/dL (ref 30.0–36.0)
MCV: 85 fL (ref 80.0–100.0)
Platelets: 313 10*3/uL (ref 150–400)
RBC: 4.06 MIL/uL (ref 3.87–5.11)
RDW: 14.2 % (ref 11.5–15.5)
WBC: 8.6 10*3/uL (ref 4.0–10.5)
nRBC: 0 % (ref 0.0–0.2)

## 2023-04-30 LAB — APTT
aPTT: 78 s — ABNORMAL HIGH (ref 24–36)
aPTT: 76 seconds — ABNORMAL HIGH (ref 24–36)

## 2023-04-30 LAB — GLUCOSE, CAPILLARY
Glucose-Capillary: 160 mg/dL — ABNORMAL HIGH (ref 70–99)
Glucose-Capillary: 171 mg/dL — ABNORMAL HIGH (ref 70–99)
Glucose-Capillary: 130 mg/dL — ABNORMAL HIGH (ref 70–99)
Glucose-Capillary: 231 mg/dL — ABNORMAL HIGH (ref 70–99)

## 2023-04-30 LAB — HEPARIN LEVEL (UNFRACTIONATED)
Heparin Unfractionated: 0.91 [IU]/mL — ABNORMAL HIGH (ref 0.30–0.70)
Heparin Unfractionated: 0.82 [IU]/mL — ABNORMAL HIGH (ref 0.30–0.70)

## 2023-04-30 NOTE — Progress Notes (Signed)
ANTICOAGULATION CONSULT NOTE -Follow-Up  Pharmacy Consult for heparin  Indication:  PAD  Allergies  Allergen Reactions   Codeine Itching    Patient Measurements: Height: 5\' 9"  (175.3 cm) Weight: 99.8 kg (220 lb 0.3 oz) IBW/kg (Calculated) : 66.2 Heparin Dosing Weight: 88kg  Vital Signs: Temp: 97.1 F (36.2 C) (06/22 1213) Temp Source: Axillary (06/22 1213) BP: 123/53 (06/22 1213) Pulse Rate: 66 (06/22 1213)  Labs: Recent Labs    04/29/23 1155 04/29/23 1257 04/30/23 0721 04/30/23 1518  HGB 11.4*  --  11.0*  --   HCT 35.1*  --  34.5*  --   PLT 342  --  313  --   APTT  --  52* 78* 76*  HEPARINUNFRC  --  >1.10* 0.91* 0.82*  CREATININE 0.83  --   --   --      Estimated Creatinine Clearance: 89.4 mL/min (by C-G formula based on SCr of 0.83 mg/dL).   Medical History: Past Medical History:  Diagnosis Date   CAD (coronary artery disease)    07/10/17 PCI/DES to LAD, EF 35-40%   Diabetes mellitus without complication (HCC)    Hyperlipidemia    Hypertension    NSTEMI (non-ST elevated myocardial infarction) (HCC)     Medications:  Medications Prior to Admission  Medication Sig Dispense Refill Last Dose   aspirin EC 81 MG tablet Take 81 mg by mouth daily.      atorvastatin (LIPITOR) 80 MG tablet Take 1 tablet (80 mg total) by mouth daily at 6 PM. 30 tablet 0    carvedilol (COREG) 6.25 MG tablet Take 1 tablet (6.25 mg total) by mouth 2 (two) times daily with a meal. 60 tablet 0    cephALEXin (KEFLEX) 500 MG capsule Take 500 mg by mouth 3 (three) times daily.      Cholecalciferol (D3 HIGH POTENCY) 125 MCG (5000 UT) capsule Take 5,000 Units by mouth daily.      clonazePAM (KLONOPIN) 1 MG tablet Take 1 mg by mouth 2 (two) times daily.      ezetimibe (ZETIA) 10 MG tablet Take 1 tablet (10 mg total) by mouth daily. 90 tablet 3    FARXIGA 10 MG TABS tablet Take 10 mg by mouth daily.       nitroGLYCERIN (NITROSTAT) 0.4 MG SL tablet Place 0.4 mg under the tongue as needed for  chest pain. (Patient not taking: Reported on 04/29/2023)      olmesartan (BENICAR) 20 MG tablet Take 1 tablet (20 mg total) by mouth daily. 90 tablet 3    OZEMPIC, 2 MG/DOSE, 8 MG/3ML SOPN Inject 2 mg into the skin once a week.      pantoprazole (PROTONIX) 40 MG tablet Take 1 tablet (40 mg total) by mouth daily. 30 tablet 0    pregabalin (LYRICA) 150 MG capsule Take 150 mg by mouth 2 (two) times daily.      rivaroxaban (XARELTO) 20 MG TABS tablet Take 1 tablet (20 mg total) by mouth daily with supper. 30 tablet 2    sertraline (ZOLOFT) 100 MG tablet Take 100 mg by mouth daily.      spironolactone (ALDACTONE) 25 MG tablet Take 25 mg by mouth daily.      ticagrelor (BRILINTA) 90 MG TABS tablet Take 1 tablet (90 mg total) by mouth 2 (two) times daily. 60 tablet 2    VASCEPA 1 g capsule Take 1 capsule (1 g total) by mouth 2 (two) times daily. 180 capsule 3    Scheduled:  aspirin EC  81 mg Oral Daily   atorvastatin  80 mg Oral q1800   carvedilol  6.25 mg Oral BID WC   ezetimibe  10 mg Oral Daily   insulin aspart  0-15 Units Subcutaneous TID WC   irbesartan  150 mg Oral Daily   pantoprazole  40 mg Oral Daily   pregabalin  150 mg Oral BID   sertraline  100 mg Oral Daily   spironolactone  25 mg Oral Daily   Infusions:   heparin 1,700 Units/hr (04/30/23 1208)    Assessment: Pt with recent vascular surgery presented with foot pain and numbness. She had her Xarelto 6/21 AM. Plan to bridge with heparin until revascularization next Wed. We will use aPTT until washout with Xarelto. Will use previous heparin rate during her last admission.    CBC stable. No signs of bleeding or issues with the heparin infusion reported by RN. aPTT therapeutic at 76sec today  with heparin running at 1,700 units/hour. Heparin level remains falsely elevated at 0.80 due to recent DOAC use. Will continue to titrate heparin off of aPTT until correlating.    Goal of Therapy:  Heparin level 0.3-0.7 units/ml aPTT 66-102  seconds Monitor platelets by anticoagulation protocol: Yes   Plan:  Continue heparin at 1700 units/hr  Check aPTT/HL, CBC daily     Leota Sauers Pharm.D. CPP, BCPS Clinical Pharmacist [phone] 04/30/2023 4:06 PM      C

## 2023-04-30 NOTE — Progress Notes (Signed)
ANTICOAGULATION CONSULT NOTE -Follow-Up  Pharmacy Consult for heparin  Indication:  PAD  Allergies  Allergen Reactions   Codeine Itching    Patient Measurements: Height: 5\' 9"  (175.3 cm) Weight: 99.8 kg (220 lb 0.3 oz) IBW/kg (Calculated) : 66.2 Heparin Dosing Weight: 88kg  Vital Signs: Temp: 98.5 F (36.9 C) (06/22 0435) Temp Source: Oral (06/22 0435) BP: 119/51 (06/22 0435) Pulse Rate: 60 (06/22 0435)  Labs: Recent Labs    04/29/23 1155 04/29/23 1257  HGB 11.4*  --   HCT 35.1*  --   PLT 342  --   APTT  --  52*  HEPARINUNFRC  --  >1.10*  CREATININE 0.83  --      Estimated Creatinine Clearance: 89.4 mL/min (by C-G formula based on SCr of 0.83 mg/dL).   Medical History: Past Medical History:  Diagnosis Date   CAD (coronary artery disease)    07/10/17 PCI/DES to LAD, EF 35-40%   Diabetes mellitus without complication (HCC)    Hyperlipidemia    Hypertension    NSTEMI (non-ST elevated myocardial infarction) (HCC)     Medications:  Medications Prior to Admission  Medication Sig Dispense Refill Last Dose   aspirin EC 81 MG tablet Take 81 mg by mouth daily.      atorvastatin (LIPITOR) 80 MG tablet Take 1 tablet (80 mg total) by mouth daily at 6 PM. 30 tablet 0    carvedilol (COREG) 6.25 MG tablet Take 1 tablet (6.25 mg total) by mouth 2 (two) times daily with a meal. 60 tablet 0    cephALEXin (KEFLEX) 500 MG capsule Take 500 mg by mouth 3 (three) times daily.      Cholecalciferol (D3 HIGH POTENCY) 125 MCG (5000 UT) capsule Take 5,000 Units by mouth daily.      clonazePAM (KLONOPIN) 1 MG tablet Take 1 mg by mouth 2 (two) times daily.      ezetimibe (ZETIA) 10 MG tablet Take 1 tablet (10 mg total) by mouth daily. 90 tablet 3    FARXIGA 10 MG TABS tablet Take 10 mg by mouth daily.       nitroGLYCERIN (NITROSTAT) 0.4 MG SL tablet Place 0.4 mg under the tongue as needed for chest pain. (Patient not taking: Reported on 04/29/2023)      olmesartan (BENICAR) 20 MG tablet  Take 1 tablet (20 mg total) by mouth daily. 90 tablet 3    OZEMPIC, 2 MG/DOSE, 8 MG/3ML SOPN Inject 2 mg into the skin once a week.      pantoprazole (PROTONIX) 40 MG tablet Take 1 tablet (40 mg total) by mouth daily. 30 tablet 0    pregabalin (LYRICA) 150 MG capsule Take 150 mg by mouth 2 (two) times daily.      rivaroxaban (XARELTO) 20 MG TABS tablet Take 1 tablet (20 mg total) by mouth daily with supper. 30 tablet 2    sertraline (ZOLOFT) 100 MG tablet Take 100 mg by mouth daily.      spironolactone (ALDACTONE) 25 MG tablet Take 25 mg by mouth daily.      ticagrelor (BRILINTA) 90 MG TABS tablet Take 1 tablet (90 mg total) by mouth 2 (two) times daily. 60 tablet 2    VASCEPA 1 g capsule Take 1 capsule (1 g total) by mouth 2 (two) times daily. 180 capsule 3    Scheduled:   aspirin EC  81 mg Oral Daily   atorvastatin  80 mg Oral q1800   carvedilol  6.25 mg Oral BID WC  ezetimibe  10 mg Oral Daily   insulin aspart  0-15 Units Subcutaneous TID WC   irbesartan  150 mg Oral Daily   pantoprazole  40 mg Oral Daily   pregabalin  150 mg Oral BID   sertraline  100 mg Oral Daily   spironolactone  25 mg Oral Daily   Infusions:   heparin 1,700 Units/hr (04/29/23 2045)    Assessment: Pt with recent vascular surgery presented with foot pain and numbness. She had her Xarelto 6/21 AM. Plan to bridge with heparin until revascularization next Wed. We will use aPTT until washout with Xarelto. Will use previous heparin rate during her last admission.   6/22 AM: CBC stable. No signs of bleeding or issues with the heparin infusion reported by RN. aPTT therapeutic at 78 with heparin running at 1,700 units/hour. Heparin level remains falsely elevated at 0.91 due to recent DOAC use. Will continue to titrate heparin off of aPTT until correlating.    Goal of Therapy:  Heparin level 0.3-0.7 units/ml aPTT 66-102 seconds Monitor platelets by anticoagulation protocol: Yes   Plan:  Continue heparin at 1700  units/hr  Check confirmatory aPTT in 8 hours, HL at same time to assess correlation Check aPTT/HL, CBC daily   Jani Gravel, PharmD PGY-2 Infectious Diseases Resident  04/30/2023 7:50 AM     C

## 2023-04-30 NOTE — Plan of Care (Signed)

## 2023-04-30 NOTE — Progress Notes (Addendum)
Progress Note    04/30/2023 6:58 AM Hospital Day 1  Subjective:  still having some pain but unchanged  Afebrile HR 50's-60's NSR 110's-130's systolic 94% RA  Vitals:   04/30/23 0026 04/30/23 0435  BP: (!) 116/56 (!) 119/51  Pulse: 60 60  Resp: 16 13  Temp: 98.3 F (36.8 C) 98.5 F (36.9 C)  SpO2: 95% 94%    Physical Exam: General:  no distress Lungs:  non labored Extremities:  monophasic right DP doppler signal; biphasic left PT and monophasic left DP  Right foot   CBC    Component Value Date/Time   WBC 9.5 04/29/2023 1155   RBC 4.14 04/29/2023 1155   HGB 11.4 (L) 04/29/2023 1155   HGB 13.8 07/07/2022 1531   HCT 35.1 (L) 04/29/2023 1155   HCT 41.8 07/07/2022 1531   PLT 342 04/29/2023 1155   PLT 192 07/07/2022 1531   MCV 84.8 04/29/2023 1155   MCV 86 07/07/2022 1531   MCH 27.5 04/29/2023 1155   MCHC 32.5 04/29/2023 1155   RDW 14.0 04/29/2023 1155   RDW 14.2 07/07/2022 1531   LYMPHSABS 1.1 02/06/2023 0126   MONOABS 0.5 02/06/2023 0126   EOSABS 0.0 02/06/2023 0126   BASOSABS 0.0 02/06/2023 0126    BMET    Component Value Date/Time   NA 137 04/29/2023 1155   NA 142 07/07/2022 1531   K 3.3 (L) 04/29/2023 1155   CL 102 04/29/2023 1155   CO2 24 04/29/2023 1155   GLUCOSE 241 (H) 04/29/2023 1155   BUN 9 04/29/2023 1155   BUN 8 07/07/2022 1531   CREATININE 0.83 04/29/2023 1155   CALCIUM 8.8 (L) 04/29/2023 1155   GFRNONAA >60 04/29/2023 1155   GFRAA 53 (L) 06/09/2020 0331    INR    Component Value Date/Time   INR 0.93 07/10/2017 1106     Intake/Output Summary (Last 24 hours) at 04/30/2023 0658 Last data filed at 04/30/2023 0300 Gross per 24 hour  Intake 345.94 ml  Output --  Net 345.94 ml     Assessment/Plan:  62 y.o. female who is s/p  right common and external iliac artery stenting by Dr. Lenell Antu on 04/22/2023 due to thrombosed right iliac system with rest pain and toe discoloration of the right foot.  Previously she had right common iliac  artery stenting by Dr. Gery Pray in September 2023.  This stent thrombosed and she underwent percutaneous thrombectomy and stenting of the right iliac by Dr. Myra Gianotti in March 2024.   She now has rest pain and discolored toes on the right foot and numbness.  She is scheduled for left to right femoral to femoral bypass grafting with Dr. Myra Gianotti 6/26 to allow Xarelto to wash out.  She was admitted yesterday for IV heparin.   Hospital Day 1  -pt pain in right foot unchanged.  She does have a monophasic doppler signal right DP.  Brisk biphasic let PT  -continue IV heparin and washout of Xarelto.  Brilinta not reordered. Continue asa/statin. -glucose 241 on BMP last eveing- Farxiga on hold as she did receive IV contrast yesterday. Finger stick glucose this am 160.  Will keep moderate SSI for now.  -CTA with runoff done yesterday and results pending.    Doreatha Massed, PA-C [facility] [phone] 04/30/2023 6:58 AM  I have interviewed the patient and examined the patient. I agree with the findings by the PA.  Brilinta and Xarelto on hold.  Dr. Myra Gianotti plans a left to right femoral-femoral  bypass on Wednesday.  Pain well-controlled.  Cari Caraway, MD

## 2023-05-01 LAB — CBC
HCT: 32.4 % — ABNORMAL LOW (ref 36.0–46.0)
Hemoglobin: 10.6 g/dL — ABNORMAL LOW (ref 12.0–15.0)
MCH: 27.7 pg (ref 26.0–34.0)
MCHC: 32.7 g/dL (ref 30.0–36.0)
MCV: 84.8 fL (ref 80.0–100.0)
Platelets: 297 10*3/uL (ref 150–400)
RBC: 3.82 MIL/uL — ABNORMAL LOW (ref 3.87–5.11)
RDW: 14.2 % (ref 11.5–15.5)
WBC: 8 10*3/uL (ref 4.0–10.5)
nRBC: 0 % (ref 0.0–0.2)

## 2023-05-01 LAB — GLUCOSE, CAPILLARY
Glucose-Capillary: 189 mg/dL — ABNORMAL HIGH (ref 70–99)
Glucose-Capillary: 142 mg/dL — ABNORMAL HIGH (ref 70–99)
Glucose-Capillary: 213 mg/dL — ABNORMAL HIGH (ref 70–99)
Glucose-Capillary: 114 mg/dL — ABNORMAL HIGH (ref 70–99)

## 2023-05-01 LAB — APTT: aPTT: 86 seconds — ABNORMAL HIGH (ref 24–36)

## 2023-05-01 LAB — HEPARIN LEVEL (UNFRACTIONATED): Heparin Unfractionated: 0.72 IU/mL — ABNORMAL HIGH (ref 0.30–0.70)

## 2023-05-01 MED ORDER — INSULIN ASPART 100 UNIT/ML IJ SOLN
0.0000 [IU] | Freq: Three times a day (TID) | INTRAMUSCULAR | Status: DC
  Administered 2023-05-01: 3 [IU] via SUBCUTANEOUS
  Administered 2023-05-01: 7 [IU] via SUBCUTANEOUS
  Administered 2023-05-02 (×2): 4 [IU] via SUBCUTANEOUS
  Administered 2023-05-02: 3 [IU] via SUBCUTANEOUS
  Administered 2023-05-03: 4 [IU] via SUBCUTANEOUS
  Administered 2023-05-03: 3 [IU] via SUBCUTANEOUS
  Administered 2023-05-03: 7 [IU] via SUBCUTANEOUS
  Administered 2023-05-04 (×2): 4 [IU] via SUBCUTANEOUS
  Administered 2023-05-05: 7 [IU] via SUBCUTANEOUS
  Administered 2023-05-05 – 2023-05-06 (×4): 4 [IU] via SUBCUTANEOUS

## 2023-05-01 NOTE — Progress Notes (Signed)
ANTICOAGULATION CONSULT NOTE -Follow-Up  Pharmacy Consult for heparin  Indication:  PAD  Allergies  Allergen Reactions   Codeine Itching    Patient Measurements: Height: 5\' 9"  (175.3 cm) Weight: 99.8 kg (220 lb 0.3 oz) IBW/kg (Calculated) : 66.2 Heparin Dosing Weight: 88kg  Vital Signs: Temp: 98.6 F (37 C) (06/23 0023) Temp Source: Oral (06/23 0023) BP: 128/49 (06/23 0023)  Labs: Recent Labs    04/29/23 1155 04/29/23 1257 04/30/23 0721 04/30/23 1518 05/01/23 0146  HGB 11.4*  --  11.0*  --  10.6*  HCT 35.1*  --  34.5*  --  32.4*  PLT 342  --  313  --  297  APTT  --    < > 78* 76* 86*  HEPARINUNFRC  --    < > 0.91* 0.82* 0.72*  CREATININE 0.83  --   --   --   --    < > = values in this interval not displayed.     Estimated Creatinine Clearance: 89.4 mL/min (by C-G formula based on SCr of 0.83 mg/dL).   Medical History: Past Medical History:  Diagnosis Date   CAD (coronary artery disease)    07/10/17 PCI/DES to LAD, EF 35-40%   Diabetes mellitus without complication (HCC)    Hyperlipidemia    Hypertension    NSTEMI (non-ST elevated myocardial infarction) (HCC)     Medications:  Medications Prior to Admission  Medication Sig Dispense Refill Last Dose   aspirin EC 81 MG tablet Take 81 mg by mouth daily.      atorvastatin (LIPITOR) 80 MG tablet Take 1 tablet (80 mg total) by mouth daily at 6 PM. 30 tablet 0    carvedilol (COREG) 6.25 MG tablet Take 1 tablet (6.25 mg total) by mouth 2 (two) times daily with a meal. 60 tablet 0    cephALEXin (KEFLEX) 500 MG capsule Take 500 mg by mouth 3 (three) times daily.      Cholecalciferol (D3 HIGH POTENCY) 125 MCG (5000 UT) capsule Take 5,000 Units by mouth daily.      clonazePAM (KLONOPIN) 1 MG tablet Take 1 mg by mouth 2 (two) times daily.      ezetimibe (ZETIA) 10 MG tablet Take 1 tablet (10 mg total) by mouth daily. 90 tablet 3    FARXIGA 10 MG TABS tablet Take 10 mg by mouth daily.       nitroGLYCERIN (NITROSTAT)  0.4 MG SL tablet Place 0.4 mg under the tongue as needed for chest pain. (Patient not taking: Reported on 04/29/2023)      olmesartan (BENICAR) 20 MG tablet Take 1 tablet (20 mg total) by mouth daily. 90 tablet 3    OZEMPIC, 2 MG/DOSE, 8 MG/3ML SOPN Inject 2 mg into the skin once a week.      pantoprazole (PROTONIX) 40 MG tablet Take 1 tablet (40 mg total) by mouth daily. 30 tablet 0    pregabalin (LYRICA) 150 MG capsule Take 150 mg by mouth 2 (two) times daily.      rivaroxaban (XARELTO) 20 MG TABS tablet Take 1 tablet (20 mg total) by mouth daily with supper. 30 tablet 2    sertraline (ZOLOFT) 100 MG tablet Take 100 mg by mouth daily.      spironolactone (ALDACTONE) 25 MG tablet Take 25 mg by mouth daily.      ticagrelor (BRILINTA) 90 MG TABS tablet Take 1 tablet (90 mg total) by mouth 2 (two) times daily. 60 tablet 2    VASCEPA  1 g capsule Take 1 capsule (1 g total) by mouth 2 (two) times daily. 180 capsule 3    Scheduled:   aspirin EC  81 mg Oral Daily   atorvastatin  80 mg Oral q1800   carvedilol  6.25 mg Oral BID WC   ezetimibe  10 mg Oral Daily   insulin aspart  0-15 Units Subcutaneous TID WC   irbesartan  150 mg Oral Daily   pantoprazole  40 mg Oral Daily   pregabalin  150 mg Oral BID   sertraline  100 mg Oral Daily   spironolactone  25 mg Oral Daily   Infusions:   heparin 1,700 Units/hr (05/01/23 0450)    Assessment: Pt with recent vascular surgery presented with foot pain and numbness. She had her Xarelto 6/21 AM. Plan to bridge with heparin until revascularization next Wed. We will use aPTT until washout with Xarelto. Will use previous heparin rate during her last admission.   6/23 AM:  CBC stable. No signs of bleeding or issues with the heparin infusion reported by RN. aPTT therapeutic at 86 sec today  with heparin running at 1,700 units/hour. Heparin level remains falsely elevated at 0.72 due to recent DOAC use, but beginning to correlate. Will continue to titrate heparin  off of aPTT until correlating.    Goal of Therapy:  Heparin level 0.3-0.7 units/ml aPTT 66-102 seconds Monitor platelets by anticoagulation protocol: Yes   Plan:  Continue heparin at 1700 units/hr  Check aPTT/HL, CBC daily  Monitor s/sx of bleeding daily   Jani Gravel, PharmD PGY-2 Infectious Diseases Resident  05/01/2023 8:00 AM

## 2023-05-01 NOTE — H&P (Signed)
Office Note     CC:  follow up Requesting Provider:  Hodge, Regina F, NP  HPI: Alice Bennett is a 61 y.o. (08/22/1961) female who presents to office as an urgent add-on.  She is status post right common and external iliac artery stenting by Dr. Hawken on 04/22/2023 due to thrombosed right iliac system with rest pain and toe discoloration of the right foot.  Previously she had right common iliac artery stenting by Dr. Barry in September 2023.  This stent thrombosed and she underwent percutaneous thrombectomy and stenting of the right iliac by Dr. Jaevin Medearis in March 2024.  She is accompanied by her daughter today.  She states since the day after discharge from the hospital last week the pain returned into her right foot.  She has noticed tissue changes of all toes of the right foot as well as her right heel.  She has been taking her aspirin, Brilinta, and Xarelto daily.  She continues to smoke about 1 to 2 cigarettes/day.  She is experiencing rest pain every night as well as numbness in her right foot.  Past medical history also significant for 3 coronary events requiring stenting.  She has diabetes mellitus with 6.7 hemoglobin A1c as of March of this year.   Past Medical History:  Diagnosis Date   CAD (coronary artery disease)    07/10/17 PCI/DES to LAD, EF 35-40%   Diabetes mellitus without complication (HCC)    Hyperlipidemia    Hypertension    NSTEMI (non-ST elevated myocardial infarction) (HCC)     Past Surgical History:  Procedure Laterality Date   ABDOMINAL AORTOGRAM W/LOWER EXTREMITY N/A 07/26/2022   Procedure: ABDOMINAL AORTOGRAM W/LOWER EXTREMITY;  Surgeon: Berry, Jonathan J, MD;  Location: [facility];  Service: Cardiovascular;  Laterality: N/A;   ABDOMINAL AORTOGRAM W/LOWER EXTREMITY N/A 04/22/2023   Procedure: ABDOMINAL AORTOGRAM W/LOWER EXTREMITY;  Surgeon: Hawken, Thomas N, MD;  Location: [facility];  Service: Cardiovascular;  Laterality: N/A;   AORTOGRAM Bilateral  02/05/2023   Procedure: AORTOGRAM WITH RUN OFF;  Surgeon: Charline Hoskinson W, MD;  Location: [facility];  Service: Vascular;  Laterality: Bilateral;   CORONARY PRESSURE/FFR STUDY N/A 07/10/2017   Procedure: INTRAVASCULAR PRESSURE WIRE/FFR STUDY;  Surgeon: Jordan, Peter M, MD;  Location: [facility];  Service: Cardiovascular;  Laterality: N/A;   CORONARY PRESSURE/FFR STUDY N/A 05/31/2022   Procedure: INTRAVASCULAR PRESSURE WIRE/FFR STUDY;  Surgeon: End, Christopher, MD;  Location: [facility];  Service: Cardiovascular;  Laterality: N/A;   CORONARY STENT INTERVENTION N/A 07/10/2017   Procedure: CORONARY STENT INTERVENTION;  Surgeon: Jordan, Peter M, MD;  Location: [facility];  Service: Cardiovascular;  Laterality: N/A;   CORONARY STENT INTERVENTION N/A 05/31/2022   Procedure: CORONARY STENT INTERVENTION;  Surgeon: End, Christopher, MD;  Location: [facility];  Service: Cardiovascular;  Laterality: N/A;   INSERTION OF ILIAC STENT Right 02/05/2023   Procedure: INSERTION OF RIGHT ILIAC STENT;  Surgeon: Stepahnie Campo W, MD;  Location: [facility];  Service: Vascular;  Laterality: Right;   LEFT HEART CATH AND CORONARY ANGIOGRAPHY N/A 07/10/2017   Procedure: LEFT HEART CATH AND CORONARY ANGIOGRAPHY;  Surgeon: Jordan, Peter M, MD;  Location: [facility];  Service: Cardiovascular;  Laterality: N/A;   LOWER EXTREMITY ANGIOGRAM N/A 02/05/2023   Procedure: RIGHT LOWER EXTREMITY ANGIOGRAM;  Surgeon: Zetha Kuhar W, MD;  Location: [facility];  Service: Vascular;  Laterality: N/A;   PERIPHERAL VASCULAR INTERVENTION  07/26/2022     Procedure: PERIPHERAL VASCULAR INTERVENTION;  Surgeon: Berry, Jonathan J, MD;  Location: [facility];  Service: Cardiovascular;;   PERIPHERAL VASCULAR INTERVENTION  04/22/2023   Procedure: PERIPHERAL VASCULAR INTERVENTION;  Surgeon: Hawken, Thomas N, MD;  [facility]: [facility];  Service: Cardiovascular;;  right common and ecternal iliac, right common femoral    RIGHT/LEFT HEART CATH AND CORONARY ANGIOGRAPHY N/A 05/31/2022   Procedure: RIGHT/LEFT HEART CATH AND CORONARY ANGIOGRAPHY;  Surgeon: End, Christopher, MD;  [facility]: [facility];  Service: Cardiovascular;  Laterality: N/A;   ULTRASOUND GUIDANCE FOR VASCULAR ACCESS Bilateral 02/05/2023   Procedure: ULTRASOUND GUIDANCE FOR VASCULAR ACCESS;  Surgeon: Clemens Lachman W, MD;  Location: [facility];  Service: Vascular;  Laterality: Bilateral;    Social History   Socioeconomic History   Marital status: Single    Spouse name: Not on file   Number of children: Not on file   Years of education: Not on file   Highest education level: Not on file  Occupational History   Not on file  Tobacco Use   Smoking status: Every Day    Packs/day: 0.50    Years: 10.00    Additional pack years: 0.00    Total pack years: 5.00    Types: Cigarettes   Smokeless tobacco: Never  Vaping Use   Vaping Use: Never used  Substance and Sexual Activity   Alcohol use: No   Drug use: No   Sexual activity: Not on file  Other Topics Concern   Not on file  Social History Narrative   Not on file   Social Determinants of Health   Financial Resource Strain: Not on file  Food Insecurity: No Food Insecurity (07/27/2022)   Hunger Vital Sign    Worried About Running Out of Food in the Last Year: Never true    Ran Out of Food in the Last Year: Never true  Transportation Needs: No Transportation Needs (07/27/2022)   PRAPARE - Transportation    Lack of Transportation (Medical): No    Lack of Transportation (Non-Medical): No  Physical Activity: Not on file  Stress: Not on file  Social Connections: Not on file  Intimate Partner Violence: Not At Risk (07/27/2022)   Humiliation, Afraid, Rape, and Kick questionnaire    Fear of Current or Ex-Partner: No    Emotionally Abused: No    Physically Abused: No    Sexually Abused: No   History reviewed. No pertinent family history.  Current Outpatient Medications  Medication Sig  Dispense Refill   aspirin EC 81 MG tablet Take 81 mg by mouth daily.     atorvastatin (LIPITOR) 80 MG tablet Take 1 tablet (80 mg total) by mouth daily at 6 PM. 30 tablet 0   carvedilol (COREG) 6.25 MG tablet Take 1 tablet (6.25 mg total) by mouth 2 (two) times daily with a meal. 60 tablet 0   cephALEXin (KEFLEX) 500 MG capsule Take 500 mg by mouth 3 (three) times daily.     Cholecalciferol (D3 HIGH POTENCY) 125 MCG (5000 UT) capsule Take 5,000 Units by mouth daily.     clonazePAM (KLONOPIN) 1 MG tablet Take 1 mg by mouth 2 (two) times daily.     ezetimibe (ZETIA) 10 MG tablet Take 1 tablet (10 mg total) by mouth daily. 90 tablet 3   FARXIGA 10 MG TABS tablet Take 10 mg by mouth daily.      olmesartan (BENICAR) 20 MG tablet Take 1 tablet (20 mg total) by mouth daily.   90 tablet 3   OZEMPIC, 2 MG/DOSE, 8 MG/3ML SOPN Inject 2 mg into the skin once a week.     pantoprazole (PROTONIX) 40 MG tablet Take 1 tablet (40 mg total) by mouth daily. 30 tablet 0   pregabalin (LYRICA) 150 MG capsule Take 150 mg by mouth 2 (two) times daily.     rivaroxaban (XARELTO) 20 MG TABS tablet Take 1 tablet (20 mg total) by mouth daily with supper. 30 tablet 2   sertraline (ZOLOFT) 100 MG tablet Take 100 mg by mouth daily.     spironolactone (ALDACTONE) 25 MG tablet Take 25 mg by mouth daily.     ticagrelor (BRILINTA) 90 MG TABS tablet Take 1 tablet (90 mg total) by mouth 2 (two) times daily. 60 tablet 2   VASCEPA 1 g capsule Take 1 capsule (1 g total) by mouth 2 (two) times daily. 180 capsule 3   nitroGLYCERIN (NITROSTAT) 0.4 MG SL tablet Place 0.4 mg under the tongue as needed for chest pain. (Patient not taking: Reported on 04/29/2023)     No current facility-administered medications for this visit.    Allergies  Allergen Reactions   Codeine Itching     REVIEW OF SYSTEMS:   [X] denotes positive finding, [ ] denotes negative finding Cardiac  Comments:  Chest pain or chest pressure:    Shortness of breath  upon exertion:    Short of breath when lying flat:    Irregular heart rhythm:        Vascular    Pain in calf, thigh, or hip brought on by ambulation:    Pain in feet at night that wakes you up from your sleep:     Blood clot in your veins:    Leg swelling:         Pulmonary    Oxygen at home:    Productive cough:     Wheezing:         Neurologic    Sudden weakness in arms or legs:     Sudden numbness in arms or legs:     Sudden onset of difficulty speaking or slurred speech:    Temporary loss of vision in one eye:     Problems with dizziness:         Gastrointestinal    Blood in stool:     Vomited blood:         Genitourinary    Burning when urinating:     Blood in urine:        Psychiatric    Major depression:         Hematologic    Bleeding problems:    Problems with blood clotting too easily:        Skin    Rashes or ulcers:        Constitutional    Fever or chills:      PHYSICAL EXAMINATION:  Vitals:   04/29/23 0937  BP: (!) 179/91  Pulse: (!) 48  Resp: 16  Temp: 97.6 F (36.4 C)  TempSrc: Temporal  SpO2: 94%  Weight: 220 lb (99.8 kg)  Height: 5' 9" (1.753 m)    General:  WDWN in NAD; vital signs documented above Gait: Not observed HENT: WNL, normocephalic Pulmonary: normal non-labored breathing , without Rales, rhonchi,  wheezing Cardiac: regular HR Abdomen: soft, NT, no masses Skin: without rashes Vascular Exam/Pulses: Palpable left femoral pulse, palpable left PT pulse; unable to palpate right femoral pulse, soft monophasic right DP signal   by Doppler Extremities: Tissue discoloration of all toes of the right foot and right lateral foot and heel with tenderness to touch; numbness to touch right foot; motor exam limited due to pain; left great toe tip also discolored Musculoskeletal: no muscle wasting or atrophy  Neurologic: A&O X 3 Psychiatric:  The pt has Normal affect.   Non-Invasive Vascular Imaging:   none    ASSESSMENT/PLAN::  61 y.o. female status post restenting of the right common and external iliac artery by Dr. Hawken on 04/22/2023   -Ms. Alice Bennett is a 61-year-old female who underwent right, iliac artery stenting by Dr. Jonathan Barry in September 2023.  This stent thrombosed and she underwent percutaneous thrombectomy with stenting by Dr. Aniko Finnigan in March of this year.  3 months later the stents thrombosed and she underwent restenting of the iliac system by Dr. Hawken 1 week ago.  She was started on Xarelto in addition to her aspirin and Brilinta.  Unfortunately patient states the day after discharge from the hospital rest pain and tissue changes returned to her right foot.  On exam she has discolored toes of the right foot as well as right lateral foot and heel.  She only has a monophasic signal in her AT/DP which likely represents thrombosis of the right iliac system.  She has numbness and motor exam is limited due to pain.  Dr. Harlan Vinal discussed options for revascularization due to this being a limb threatening situation.  She would likely not tolerate aortobifemoral bypass given her coronary history as well as morbid obesity.  She will be scheduled for left or right femoral to femoral bypass graft with Dr. Norva Bowe on Wednesday.  She will be admitted to the hospital today for anticoagulation washout, pain control, and initiation of IV heparin.  The patient and her daughter are agreeable to admission today with plans for surgery on Wednesday.  We will also obtain a CTA aorta with runoff to help with plans for revascularization.   Matthew Eveland, PA-C [facility] [phone]  Clinic MD:   Nickalos Petersen  

## 2023-05-01 NOTE — Progress Notes (Addendum)
Progress Note    05/01/2023 6:45 AM Hospital Day 2  Subjective:  says her foot still feels about the same.  Says someone stepped on her foot this morning.   Afebrile HR 50's-70's NSR 120's systolic 95% RA  Gtts:  heparin  Vitals:   05/01/23 0023 05/01/23 0100  BP: (!) 128/49   Pulse:    Resp: 13 14  Temp: 98.6 F (37 C)   SpO2: 95%     Physical Exam: General:  resting and wakes easily.  No distress Lungs:  non labored Extremities:  +monophasic doppler signal right DP; toes ischemic appearing  CBC    Component Value Date/Time   WBC 8.0 05/01/2023 0146   RBC 3.82 (L) 05/01/2023 0146   HGB 10.6 (L) 05/01/2023 0146   HGB 13.8 07/07/2022 1531   HCT 32.4 (L) 05/01/2023 0146   HCT 41.8 07/07/2022 1531   PLT 297 05/01/2023 0146   PLT 192 07/07/2022 1531   MCV 84.8 05/01/2023 0146   MCV 86 07/07/2022 1531   MCH 27.7 05/01/2023 0146   MCHC 32.7 05/01/2023 0146   RDW 14.2 05/01/2023 0146   RDW 14.2 07/07/2022 1531   LYMPHSABS 1.1 02/06/2023 0126   MONOABS 0.5 02/06/2023 0126   EOSABS 0.0 02/06/2023 0126   BASOSABS 0.0 02/06/2023 0126    BMET    Component Value Date/Time   NA 137 04/29/2023 1155   NA 142 07/07/2022 1531   K 3.3 (L) 04/29/2023 1155   CL 102 04/29/2023 1155   CO2 24 04/29/2023 1155   GLUCOSE 241 (H) 04/29/2023 1155   BUN 9 04/29/2023 1155   BUN 8 07/07/2022 1531   CREATININE 0.83 04/29/2023 1155   CALCIUM 8.8 (L) 04/29/2023 1155   GFRNONAA >60 04/29/2023 1155   GFRAA 53 (L) 06/09/2020 0331    INR    Component Value Date/Time   INR 0.93 07/10/2017 1106     Intake/Output Summary (Last 24 hours) at 05/01/2023 0645 Last data filed at 05/01/2023 0600 Gross per 24 hour  Intake 458.87 ml  Output --  Net 458.87 ml     Assessment/Plan:  62 y.o. female s/p  right common and external iliac artery stenting by Dr. Lenell Antu on 04/22/2023 due to thrombosed right iliac system with rest pain and toe discoloration of the right foot.  Previously she  had right common iliac artery stenting by Dr. Gery Pray in September 2023.  This stent thrombosed and she underwent percutaneous thrombectomy and stenting of the right iliac by Dr. Myra Gianotti in March 2024.    She now has rest pain and discolored toes on the right foot and numbness.  She is scheduled for left to right femoral to femoral bypass grafting with Dr. Myra Gianotti 6/26 to allow Xarelto to wash out.  She was admitted yesterday for IV heparin  Hospital Day 2  -continues to have pain in right foot but unchanged.   -continue heparin  -CTA for surgical planning- results still pending -elevated glucose on moderate SSI- increased SSI and consult DM coordinator (ordered).  -encouraged her to continue to get out of bed to chair and mobilize as tolerated and protect her foot.    Doreatha Massed, PA-C [facility] [phone] 05/01/2023 6:45 AM  I have interviewed the patient and examined the patient. I agree with the findings by the PA.  The right foot is stable.  She is on intravenous heparin.  Her Brilinta and Xarelto are on hold.  She is scheduled for surgery on  Wednesday.  Cari Caraway, MD

## 2023-05-02 ENCOUNTER — Inpatient Hospital Stay (HOSPITAL_COMMUNITY): Payer: Medicaid Other

## 2023-05-02 LAB — URINALYSIS, COMPLETE (UACMP) WITH MICROSCOPIC
Bilirubin Urine: NEGATIVE
Glucose, UA: NEGATIVE mg/dL
Ketones, ur: NEGATIVE mg/dL
Nitrite: NEGATIVE
Protein, ur: 30 mg/dL — AB
Specific Gravity, Urine: 1.015 (ref 1.005–1.030)
WBC, UA: >50 WBC/hpf (ref 0–5)
pH: 5 (ref 5.0–8.0)

## 2023-05-02 LAB — GLUCOSE, CAPILLARY
Glucose-Capillary: 118 mg/dL — ABNORMAL HIGH (ref 70–99)
Glucose-Capillary: 149 mg/dL — ABNORMAL HIGH (ref 70–99)
Glucose-Capillary: 192 mg/dL — ABNORMAL HIGH (ref 70–99)
Glucose-Capillary: 177 mg/dL — ABNORMAL HIGH (ref 70–99)
Glucose-Capillary: 210 mg/dL — ABNORMAL HIGH (ref 70–99)

## 2023-05-02 LAB — CBC
HCT: 31.7 % — ABNORMAL LOW (ref 36.0–46.0)
Hemoglobin: 10.2 g/dL — ABNORMAL LOW (ref 12.0–15.0)
MCH: 27.8 pg (ref 26.0–34.0)
MCHC: 32.2 g/dL (ref 30.0–36.0)
MCV: 86.4 fL (ref 80.0–100.0)
Platelets: 237 10*3/uL (ref 150–400)
RBC: 3.67 MIL/uL — ABNORMAL LOW (ref 3.87–5.11)
RDW: 14.1 % (ref 11.5–15.5)
WBC: 10 10*3/uL (ref 4.0–10.5)
nRBC: 0 % (ref 0.0–0.2)

## 2023-05-02 LAB — APTT: aPTT: 65 seconds — ABNORMAL HIGH (ref 24–36)

## 2023-05-02 LAB — HEPARIN LEVEL (UNFRACTIONATED): Heparin Unfractionated: 0.38 IU/mL (ref 0.30–0.70)

## 2023-05-02 NOTE — Progress Notes (Signed)
Temp 102.1, PA provider paged and aware.

## 2023-05-02 NOTE — Progress Notes (Signed)
ANTICOAGULATION CONSULT NOTE -Follow-Up  Pharmacy Consult for heparin  Indication:  PAD  Allergies  Allergen Reactions   Codeine Itching    Patient Measurements: Height: 5\' 9"  (175.3 cm) Weight: 99.8 kg (220 lb 0.3 oz) IBW/kg (Calculated) : 66.2 Heparin Dosing Weight: 88kg  Vital Signs: Temp: 100.7 F (38.2 C) (06/24 0752) Temp Source: Oral (06/24 0752) BP: 111/59 (06/24 0752) Pulse Rate: 70 (06/24 0752)  Labs: Recent Labs    04/29/23 1155 04/29/23 1257 04/30/23 0721 04/30/23 1518 05/01/23 0146 05/02/23 0125  HGB 11.4*  --  11.0*  --  10.6* 10.2*  HCT 35.1*  --  34.5*  --  32.4* 31.7*  PLT 342  --  313  --  297 237  APTT  --    < > 78* 76* 86* 65*  HEPARINUNFRC  --    < > 0.91* 0.82* 0.72* 0.38  CREATININE 0.83  --   --   --   --   --    < > = values in this interval not displayed.     Estimated Creatinine Clearance: 89.4 mL/min (by C-G formula based on SCr of 0.83 mg/dL).   Medical History: Past Medical History:  Diagnosis Date   CAD (coronary artery disease)    07/10/17 PCI/DES to LAD, EF 35-40%   Diabetes mellitus without complication (HCC)    Hyperlipidemia    Hypertension    NSTEMI (non-ST elevated myocardial infarction) [facility])      Assessment: Pt with recent vascular surgery presented with foot pain and numbness. She had her Xarelto 6/21 AM. Plan to bridge with heparin until revascularization next Wed.   Heparin level therapeutic this AM  Goal of Therapy:  Heparin level 0.3-0.7 units/ml aPTT 66-102 seconds Monitor platelets by anticoagulation protocol: Yes   Plan:  Continue heparin at 1700 units/hr  Check heparin level, CBC daily Monitor s/sx of bleeding daily   Thank you Okey Regal, PharmD 05/02/2023 8:05 AM

## 2023-05-02 NOTE — Progress Notes (Signed)
  Progress Note    05/02/2023 5:13 AM Hospital Day 3  Subjective:  says her right foot still hurts but is unchanged.    Tm 100.9 now 100 HR 60's-80's NSR 110's-150's systolic 98% RA  Vitals:   05/01/23 2331 05/02/23 0416  BP: (!) 121/59 112/60  Pulse: 85 81  Resp: 18 20  Temp: (!) 100.7 F (38.2 C) 100 F (37.8 C)  SpO2: 95% 98%    Physical Exam: General:  walking from bathroom to bed-no distress Lungs:  non labored Extremities:  right foot unchanged.  Continues to have decreased sensory and motor right foot.  Palpable left femoral pulse.   CBC    Component Value Date/Time   WBC 10.0 05/02/2023 0125   RBC 3.67 (L) 05/02/2023 0125   HGB 10.2 (L) 05/02/2023 0125   HGB 13.8 07/07/2022 1531   HCT 31.7 (L) 05/02/2023 0125   HCT 41.8 07/07/2022 1531   PLT 237 05/02/2023 0125   PLT 192 07/07/2022 1531   MCV 86.4 05/02/2023 0125   MCV 86 07/07/2022 1531   MCH 27.8 05/02/2023 0125   MCHC 32.2 05/02/2023 0125   RDW 14.1 05/02/2023 0125   RDW 14.2 07/07/2022 1531   LYMPHSABS 1.1 02/06/2023 0126   MONOABS 0.5 02/06/2023 0126   EOSABS 0.0 02/06/2023 0126   BASOSABS 0.0 02/06/2023 0126    BMET    Component Value Date/Time   NA 137 04/29/2023 1155   NA 142 07/07/2022 1531   K 3.3 (L) 04/29/2023 1155   CL 102 04/29/2023 1155   CO2 24 04/29/2023 1155   GLUCOSE 241 (H) 04/29/2023 1155   BUN 9 04/29/2023 1155   BUN 8 07/07/2022 1531   CREATININE 0.83 04/29/2023 1155   CALCIUM 8.8 (L) 04/29/2023 1155   GFRNONAA >60 04/29/2023 1155   GFRAA 53 (L) 06/09/2020 0331    INR    Component Value Date/Time   INR 0.93 07/10/2017 1106     Intake/Output Summary (Last 24 hours) at 05/02/2023 0513 Last data filed at 05/02/2023 0416 Gross per 24 hour  Intake 653.72 ml  Output --  Net 653.72 ml     Assessment/Plan:  62 y.o. female s/p  right common and external iliac artery stenting by Dr. Lenell Antu on 04/22/2023 due to thrombosed right iliac system with rest pain and toe  discoloration of the right foot.  Previously she had right common iliac artery stenting by Dr. Gery Pray in September 2023.  This stent thrombosed and she underwent percutaneous thrombectomy and stenting of the right iliac by Dr. Myra Gianotti in March 2024.    She now has rest pain and discolored toes on the right foot and numbness.  She is scheduled for left to right femoral to femoral bypass grafting with Dr. Myra Gianotti 6/26 to allow Xarelto to wash out.  She was admitted yesterday for IV heparin   Hospital Day 3   -fever-WBC normal at 10k.  Given she is having surgery on Wednesday, will check u/a.  Her right foot is unchanged.  She is 98% on RA. -continue IV heparin and allowing Xarelto and brilinta to wash out.   -SSI increased yesterday.  DM coordinator consult ordered for today.   Doreatha Massed, PA-C [facility] [phone] 05/02/2023 5:13 AM

## 2023-05-03 ENCOUNTER — Encounter (HOSPITAL_COMMUNITY): Payer: Self-pay | Admitting: Surgery

## 2023-05-03 LAB — CBC
HCT: 29.4 % — ABNORMAL LOW (ref 36.0–46.0)
Hemoglobin: 9.5 g/dL — ABNORMAL LOW (ref 12.0–15.0)
MCH: 27.9 pg (ref 26.0–34.0)
MCHC: 32.3 g/dL (ref 30.0–36.0)
MCV: 86.2 fL (ref 80.0–100.0)
Platelets: 192 10*3/uL (ref 150–400)
RBC: 3.41 MIL/uL — ABNORMAL LOW (ref 3.87–5.11)
RDW: 14.3 % (ref 11.5–15.5)
WBC: 5.7 10*3/uL (ref 4.0–10.5)
nRBC: 0 % (ref 0.0–0.2)

## 2023-05-03 LAB — SURGICAL PCR SCREEN
MRSA, PCR: NEGATIVE
Staphylococcus aureus: NEGATIVE

## 2023-05-03 LAB — TYPE AND SCREEN
ABO/RH(D): A POS
Antibody Screen: NEGATIVE

## 2023-05-03 LAB — HEPARIN LEVEL (UNFRACTIONATED): Heparin Unfractionated: 0.33 IU/mL (ref 0.30–0.70)

## 2023-05-03 LAB — GLUCOSE, CAPILLARY
Glucose-Capillary: 162 mg/dL — ABNORMAL HIGH (ref 70–99)
Glucose-Capillary: 245 mg/dL — ABNORMAL HIGH (ref 70–99)
Glucose-Capillary: 145 mg/dL — ABNORMAL HIGH (ref 70–99)
Glucose-Capillary: 160 mg/dL — ABNORMAL HIGH (ref 70–99)

## 2023-05-03 LAB — ABO/RH: ABO/RH(D): A POS

## 2023-05-03 LAB — POCT ACTIVATED CLOTTING TIME: Activated Clotting Time: 293 seconds

## 2023-05-03 MED ORDER — CEFAZOLIN SODIUM-DEXTROSE 1-4 GM/50ML-% IV SOLN
1.0000 g | INTRAVENOUS | Status: AC
  Administered 2023-05-04 (×2): 1 g via INTRAVENOUS
  Filled 2023-05-03 (×2): qty 50

## 2023-05-03 MED ORDER — SULFAMETHOXAZOLE-TRIMETHOPRIM 800-160 MG PO TABS
1.0000 | ORAL_TABLET | Freq: Two times a day (BID) | ORAL | Status: DC
  Administered 2023-05-03 – 2023-05-06 (×6): 1 via ORAL
  Filled 2023-05-03 (×6): qty 1

## 2023-05-03 NOTE — H&P (View-Only) (Signed)
Progress Note    05/03/2023 6:49 AM Hospital Day 4  Subjective:  still with painful right foot.   Tm 102. 1  now 99.1  HR 60's-80's NSR 90's-150's systolic 99% RA  Vitals:   05/02/23 2334 05/03/23 0359  BP: (!) 92/53 (!) 124/52  Pulse: 62 64  Resp: 18 19  Temp: 98.8 F (37.1 C) 99.1 F (37.3 C)  SpO2: 93% 98%    Physical Exam: General:  no distress sitting on side of bed.  Lungs:  non labored Extremities:     CBC    Component Value Date/Time   WBC 5.7 05/03/2023 0059   RBC 3.41 (L) 05/03/2023 0059   HGB 9.5 (L) 05/03/2023 0059   HGB 13.8 07/07/2022 1531   HCT 29.4 (L) 05/03/2023 0059   HCT 41.8 07/07/2022 1531   PLT 192 05/03/2023 0059   PLT 192 07/07/2022 1531   MCV 86.2 05/03/2023 0059   MCV 86 07/07/2022 1531   MCH 27.9 05/03/2023 0059   MCHC 32.3 05/03/2023 0059   RDW 14.3 05/03/2023 0059   RDW 14.2 07/07/2022 1531   LYMPHSABS 1.1 02/06/2023 0126   MONOABS 0.5 02/06/2023 0126   EOSABS 0.0 02/06/2023 0126   BASOSABS 0.0 02/06/2023 0126    BMET    Component Value Date/Time   NA 137 04/29/2023 1155   NA 142 07/07/2022 1531   K 3.3 (L) 04/29/2023 1155   CL 102 04/29/2023 1155   CO2 24 04/29/2023 1155   GLUCOSE 241 (H) 04/29/2023 1155   BUN 9 04/29/2023 1155   BUN 8 07/07/2022 1531   CREATININE 0.83 04/29/2023 1155   CALCIUM 8.8 (L) 04/29/2023 1155   GFRNONAA >60 04/29/2023 1155   GFRAA 53 (L) 06/09/2020 0331    INR    Component Value Date/Time   INR 0.93 07/10/2017 1106     Intake/Output Summary (Last 24 hours) at 05/03/2023 1610 Last data filed at 05/02/2023 1700 Gross per 24 hour  Intake --  Output 800 ml  Net -800 ml     Assessment/Plan:  62 y.o. female s/p  right common and external iliac artery stenting by Dr. Lenell Antu on 04/22/2023 due to thrombosed right iliac system with rest pain and toe discoloration of the right foot.  Previously she had right common iliac artery stenting by Dr. Gery Pray in September 2023.  This stent  thrombosed and she underwent percutaneous thrombectomy and stenting of the right iliac by Dr. Myra Gianotti in March 2024.    She now has rest pain and discolored toes on the right foot and numbness.  She is scheduled for left to right femoral to femoral bypass grafting with Dr. Myra Gianotti 6/26 to allow Xarelto to wash out.  She was admitted yesterday for IV heparin    Hospital Day 4  -Tm of 102. 1 now 99.1. u/a revealed moderate leukocytes, many bacteria.  Pt has been started on Bactrim.  This may delay surgery.  Dr. Myra Gianotti to determine plan.  Discussed this with pt.  NPO after MN/labs ordered. -right 2nd toe appears darker today-discussed with pt to protect foot and float heel off bed when in bed.  Ulcer on 4th toe appears unchanged.   Doreatha Massed, PA-C [facility] [phone] 05/03/2023 6:49 AM  I agree with the above.  Plan for Fem-fem bypass tomorrow.  Discussed details of surgery.  Al questions answered.  Currently being treated for a UTI.  I do not think this should delay surgery due to the limb threatening  nature of her right foot.  She will have received 24 hours of abx  NPO after midnight  Wells Andi Mahaffy

## 2023-05-03 NOTE — Progress Notes (Signed)
ANTICOAGULATION CONSULT NOTE -Follow-Up  Pharmacy Consult for heparin  Indication:  PAD  Allergies  Allergen Reactions   Codeine Itching    Patient Measurements: Height: 5\' 9"  (175.3 cm) Weight: 99.8 kg (220 lb 0.3 oz) IBW/kg (Calculated) : 66.2 Heparin Dosing Weight: 88kg  Vital Signs: Temp: 99.3 F (37.4 C) (06/25 0748) Temp Source: Oral (06/25 0748) BP: 108/50 (06/25 0748) Pulse Rate: 68 (06/25 0748)  Labs: Recent Labs    04/30/23 1518 04/30/23 1518 05/01/23 0146 05/02/23 0125 05/03/23 0059  HGB  --    < > 10.6* 10.2* 9.5*  HCT  --   --  32.4* 31.7* 29.4*  PLT  --   --  297 237 192  APTT 76*  --  86* 65*  --   HEPARINUNFRC 0.82*  --  0.72* 0.38 0.33   < > = values in this interval not displayed.     Estimated Creatinine Clearance: 89.4 mL/min (by C-G formula based on SCr of 0.83 mg/dL).   Medical History: Past Medical History:  Diagnosis Date   CAD (coronary artery disease)    07/10/17 PCI/DES to LAD, EF 35-40%   Diabetes mellitus without complication (HCC)    Hyperlipidemia    Hypertension    NSTEMI (non-ST elevated myocardial infarction) [facility])      Assessment: Pt with recent vascular surgery presented with foot pain and numbness. She had her Xarelto 6/21 AM. Plan to bridge with heparin until revascularization next Wed.   Heparin level therapeutic this AM  Goal of Therapy:  Heparin level 0.3-0.7 units/ml aPTT 66-102 seconds Monitor platelets by anticoagulation protocol: Yes   Plan:  Continue heparin at 1700 units/hr  Check heparin level, CBC daily Monitor s/sx of bleeding daily   Thank you Okey Regal, PharmD 05/03/2023 8:19 AM

## 2023-05-03 NOTE — Inpatient Diabetes Management (Signed)
Inpatient Diabetes Program Recommendations  AACE/ADA: New Consensus Statement on Inpatient Glycemic Control (2015)  Target Ranges:  Prepandial:   less than 140 mg/dL      Peak postprandial:   less than 180 mg/dL (1-2 hours)      Critically ill patients:  140 - 180 mg/dL   Lab Results  Component Value Date   GLUCAP 162 (H) 05/03/2023   HGBA1C 6.7 (H) 02/05/2023    Review of Glycemic Control  Latest Reference Range & Units 05/02/23 06:12 05/02/23 13:04 05/02/23 16:41 05/02/23 22:22 05/03/23 06:13  Glucose-Capillary 70 - 99 mg/dL 557 (H) 322 (H) 025 (H) 210 (H) 162 (H)  (H): Data is abnormally high  Diabetes history: DM Outpatient Diabetes medications:  Farxiga 10 mg every day Ozempic 2 mg weekly Current orders for Inpatient glycemic control:  Novolog 0-20 units TID  Referral received for DM management.  Glucose is trending well with Novolog 0-20 units TID.    Will continue to follow while inpatient.  Thank you, Dulce Sellar, MSN, CDCES Diabetes Coordinator Inpatient Diabetes Program [phone] (team pager from 8a-5p)

## 2023-05-03 NOTE — Progress Notes (Addendum)
Progress Note    05/03/2023 6:49 AM Hospital Day 4  Subjective:  still with painful right foot.   Tm 102. 1  now 99.1  HR 60's-80's NSR 90's-150's systolic 99% RA  Vitals:   05/02/23 2334 05/03/23 0359  BP: (!) 92/53 (!) 124/52  Pulse: 62 64  Resp: 18 19  Temp: 98.8 F (37.1 C) 99.1 F (37.3 C)  SpO2: 93% 98%    Physical Exam: General:  no distress sitting on side of bed.  Lungs:  non labored Extremities:     CBC    Component Value Date/Time   WBC 5.7 05/03/2023 0059   RBC 3.41 (L) 05/03/2023 0059   HGB 9.5 (L) 05/03/2023 0059   HGB 13.8 07/07/2022 1531   HCT 29.4 (L) 05/03/2023 0059   HCT 41.8 07/07/2022 1531   PLT 192 05/03/2023 0059   PLT 192 07/07/2022 1531   MCV 86.2 05/03/2023 0059   MCV 86 07/07/2022 1531   MCH 27.9 05/03/2023 0059   MCHC 32.3 05/03/2023 0059   RDW 14.3 05/03/2023 0059   RDW 14.2 07/07/2022 1531   LYMPHSABS 1.1 02/06/2023 0126   MONOABS 0.5 02/06/2023 0126   EOSABS 0.0 02/06/2023 0126   BASOSABS 0.0 02/06/2023 0126    BMET    Component Value Date/Time   NA 137 04/29/2023 1155   NA 142 07/07/2022 1531   K 3.3 (L) 04/29/2023 1155   CL 102 04/29/2023 1155   CO2 24 04/29/2023 1155   GLUCOSE 241 (H) 04/29/2023 1155   BUN 9 04/29/2023 1155   BUN 8 07/07/2022 1531   CREATININE 0.83 04/29/2023 1155   CALCIUM 8.8 (L) 04/29/2023 1155   GFRNONAA >60 04/29/2023 1155   GFRAA 53 (L) 06/09/2020 0331    INR    Component Value Date/Time   INR 0.93 07/10/2017 1106     Intake/Output Summary (Last 24 hours) at 05/03/2023 0649 Last data filed at 05/02/2023 1700 Gross per 24 hour  Intake --  Output 800 ml  Net -800 ml     Assessment/Plan:  62 y.o. female s/p  right common and external iliac artery stenting by Dr. Hawken on 04/22/2023 due to thrombosed right iliac system with rest pain and toe discoloration of the right foot.  Previously she had right common iliac artery stenting by Dr. Barry in September 2023.  This stent  thrombosed and she underwent percutaneous thrombectomy and stenting of the right iliac by Dr. Cresencio Reesor in March 2024.    She now has rest pain and discolored toes on the right foot and numbness.  She is scheduled for left to right femoral to femoral bypass grafting with Dr. Earlin Sweeden 6/26 to allow Xarelto to wash out.  She was admitted yesterday for IV heparin    Hospital Day 4  -Tm of 102. 1 now 99.1. u/a revealed moderate leukocytes, many bacteria.  Pt has been started on Bactrim.  This may delay surgery.  Dr. Sharmarke Cicio to determine plan.  Discussed this with pt.  NPO after MN/labs ordered. -right 2nd toe appears darker today-discussed with pt to protect foot and float heel off bed when in bed.  Ulcer on 4th toe appears unchanged.   Samantha Rhyne, PA-C [facility] [phone] 05/03/2023 6:49 AM  I agree with the above.  Plan for Fem-fem bypass tomorrow.  Discussed details of surgery.  Al questions answered.  Currently being treated for a UTI.  I do not think this should delay surgery due to the limb threatening   nature of her right foot.  She will have received 24 hours of abx  NPO after midnight  Wells Latiffany Harwick   

## 2023-05-04 ENCOUNTER — Encounter (HOSPITAL_COMMUNITY): Payer: Self-pay | Admitting: Surgery

## 2023-05-04 ENCOUNTER — Inpatient Hospital Stay (HOSPITAL_COMMUNITY): Payer: Medicaid Other | Admitting: Anesthesiology

## 2023-05-04 ENCOUNTER — Ambulatory Visit: Payer: Medicaid Other | Admitting: Cardiovascular Disease

## 2023-05-04 ENCOUNTER — Encounter (HOSPITAL_COMMUNITY)
Admission: AD | Disposition: A | Discharge status: Home / Self Care | Payer: Self-pay | Source: Ambulatory Visit | Attending: Surgery

## 2023-05-04 ENCOUNTER — Other Ambulatory Visit: Payer: Self-pay

## 2023-05-04 DIAGNOSIS — E1151 Type 2 diabetes mellitus with diabetic peripheral angiopathy without gangrene: Secondary | ICD-10-CM

## 2023-05-04 DIAGNOSIS — I998 Other disorder of circulatory system: Secondary | ICD-10-CM

## 2023-05-04 DIAGNOSIS — I252 Old myocardial infarction: Secondary | ICD-10-CM

## 2023-05-04 DIAGNOSIS — Z7984 Long term (current) use of oral hypoglycemic drugs: Secondary | ICD-10-CM

## 2023-05-04 DIAGNOSIS — I10 Essential (primary) hypertension: Secondary | ICD-10-CM

## 2023-05-04 DIAGNOSIS — I2510 Atherosclerotic heart disease of native coronary artery without angina pectoris: Secondary | ICD-10-CM

## 2023-05-04 DIAGNOSIS — F17210 Nicotine dependence, cigarettes, uncomplicated: Secondary | ICD-10-CM

## 2023-05-04 HISTORY — PX: FEMORAL-FEMORAL BYPASS GRAFT: SHX936

## 2023-05-04 HISTORY — PX: APPLICATION OF WOUND VAC: SHX5189

## 2023-05-04 LAB — CBC
HCT: 27.3 % — ABNORMAL LOW (ref 36.0–46.0)
Hemoglobin: 8.8 g/dL — ABNORMAL LOW (ref 12.0–15.0)
MCH: 27 pg (ref 26.0–34.0)
MCHC: 32.2 g/dL (ref 30.0–36.0)
MCV: 83.7 fL (ref 80.0–100.0)
Platelets: 163 10*3/uL (ref 150–400)
RBC: 3.26 MIL/uL — ABNORMAL LOW (ref 3.87–5.11)
RDW: 14.4 % (ref 11.5–15.5)
WBC: 5.3 10*3/uL (ref 4.0–10.5)
nRBC: 0 % (ref 0.0–0.2)

## 2023-05-04 LAB — BASIC METABOLIC PANEL
Anion gap: 9 (ref 5–15)
BUN: 13 mg/dL (ref 8–23)
CO2: 25 mmol/L (ref 22–32)
Calcium: 8.1 mg/dL — ABNORMAL LOW (ref 8.9–10.3)
Chloride: 98 mmol/L (ref 98–111)
Creatinine, Ser: 0.99 mg/dL (ref 0.44–1.00)
GFR, Estimated: >60 mL/min (ref >60)
Glucose, Bld: 166 mg/dL — ABNORMAL HIGH (ref 70–99)
Potassium: 3.6 mmol/L (ref 3.5–5.1)
Sodium: 132 mmol/L — ABNORMAL LOW (ref 135–145)

## 2023-05-04 LAB — GLUCOSE, CAPILLARY
Glucose-Capillary: 150 mg/dL — ABNORMAL HIGH (ref 70–99)
Glucose-Capillary: 160 mg/dL — ABNORMAL HIGH (ref 70–99)
Glucose-Capillary: 116 mg/dL — ABNORMAL HIGH (ref 70–99)
Glucose-Capillary: 124 mg/dL — ABNORMAL HIGH (ref 70–99)
Glucose-Capillary: 141 mg/dL — ABNORMAL HIGH (ref 70–99)
Glucose-Capillary: 129 mg/dL — ABNORMAL HIGH (ref 70–99)
Glucose-Capillary: 176 mg/dL — ABNORMAL HIGH (ref 70–99)
Glucose-Capillary: 223 mg/dL — ABNORMAL HIGH (ref 70–99)

## 2023-05-04 LAB — HEPARIN LEVEL (UNFRACTIONATED): Heparin Unfractionated: 0.19 IU/mL — ABNORMAL LOW (ref 0.30–0.70)

## 2023-05-04 SURGERY — CREATION, BYPASS, ARTERIAL, FEMORAL TO FEMORAL, USING GRAFT
Anesthesia: General | Site: Groin | Laterality: Bilateral

## 2023-05-04 MED ORDER — AMISULPRIDE (ANTIEMETIC) 5 MG/2ML IV SOLN: 10.0000 mg | Freq: Once | INTRAVENOUS | Status: DC | PRN

## 2023-05-04 MED ORDER — HEPARIN SODIUM (PORCINE) 1000 UNIT/ML IJ SOLN
INTRAMUSCULAR | Status: AC
  Filled 2023-05-04: qty 10

## 2023-05-04 MED ORDER — HEPARIN SODIUM (PORCINE) 1000 UNIT/ML IJ SOLN
INTRAMUSCULAR | Status: DC | PRN
  Administered 2023-05-04: 10000 [IU] via INTRAVENOUS

## 2023-05-04 MED ORDER — POLYETHYLENE GLYCOL 3350 17 G PO PACK: 17.0000 g | PACK | Freq: Every day | ORAL | Status: DC | PRN

## 2023-05-04 MED ORDER — DOCUSATE SODIUM 100 MG PO CAPS
100.0000 mg | ORAL_CAPSULE | Freq: Every day | ORAL | Status: DC
  Administered 2023-05-05 – 2023-05-06 (×2): 100 mg via ORAL
  Filled 2023-05-04 (×2): qty 1

## 2023-05-04 MED ORDER — LIDOCAINE 2% (20 MG/ML) 5 ML SYRINGE
INTRAMUSCULAR | Status: AC
  Filled 2023-05-04: qty 5

## 2023-05-04 MED ORDER — PROTAMINE SULFATE 10 MG/ML IV SOLN
INTRAVENOUS | Status: DC | PRN
  Administered 2023-05-04 (×5): 10 mg via INTRAVENOUS

## 2023-05-04 MED ORDER — FENTANYL CITRATE (PF) 100 MCG/2ML IJ SOLN
INTRAMUSCULAR | Status: AC
  Filled 2023-05-04: qty 2

## 2023-05-04 MED ORDER — DEXTROSE 50 % IV SOLN: 0.0000 mL | INTRAVENOUS | Status: DC | PRN

## 2023-05-04 MED ORDER — ORAL CARE MOUTH RINSE: 15.0000 mL | Freq: Once | OROMUCOSAL | Status: AC

## 2023-05-04 MED ORDER — PROPOFOL 10 MG/ML IV BOLUS
INTRAVENOUS | Status: AC
  Filled 2023-05-04: qty 20

## 2023-05-04 MED ORDER — 0.9 % SODIUM CHLORIDE (POUR BTL) OPTIME
TOPICAL | Status: DC | PRN
  Administered 2023-05-04: 1000 mL

## 2023-05-04 MED ORDER — FENTANYL CITRATE (PF) 250 MCG/5ML IJ SOLN
INTRAMUSCULAR | Status: DC | PRN
  Administered 2023-05-04: 100 ug via INTRAVENOUS
  Administered 2023-05-04 (×4): 25 ug via INTRAVENOUS

## 2023-05-04 MED ORDER — LABETALOL HCL 5 MG/ML IV SOLN
INTRAVENOUS | Status: DC | PRN
  Administered 2023-05-04: 5 mg via INTRAVENOUS

## 2023-05-04 MED ORDER — ONDANSETRON HCL 4 MG/2ML IJ SOLN
INTRAMUSCULAR | Status: DC | PRN
  Administered 2023-05-04: 4 mg via INTRAVENOUS

## 2023-05-04 MED ORDER — LIDOCAINE 2% (20 MG/ML) 5 ML SYRINGE
INTRAMUSCULAR | Status: DC | PRN
  Administered 2023-05-04: 100 mg via INTRAVENOUS

## 2023-05-04 MED ORDER — LACTATED RINGERS IV SOLN: INTRAVENOUS | Status: DC | PRN

## 2023-05-04 MED ORDER — OXYCODONE HCL 5 MG PO TABS: 5.0000 mg | ORAL_TABLET | Freq: Once | ORAL | Status: DC | PRN

## 2023-05-04 MED ORDER — CEFAZOLIN SODIUM 1 G IJ SOLR
INTRAMUSCULAR | Status: AC
  Filled 2023-05-04: qty 10

## 2023-05-04 MED ORDER — HEPARIN 6000 UNIT IRRIGATION SOLUTION
Status: DC | PRN
  Administered 2023-05-04: 1

## 2023-05-04 MED ORDER — PROPOFOL 10 MG/ML IV BOLUS
INTRAVENOUS | Status: DC | PRN
  Administered 2023-05-04: 160 mg via INTRAVENOUS

## 2023-05-04 MED ORDER — CEFAZOLIN SODIUM-DEXTROSE 2-4 GM/100ML-% IV SOLN
2.0000 g | Freq: Three times a day (TID) | INTRAVENOUS | Status: AC
  Administered 2023-05-04 – 2023-05-05 (×2): 2 g via INTRAVENOUS
  Filled 2023-05-04 (×2): qty 100

## 2023-05-04 MED ORDER — PHENYLEPHRINE HCL-NACL 20-0.9 MG/250ML-% IV SOLN
INTRAVENOUS | Status: DC | PRN
  Administered 2023-05-04: 30 ug/min via INTRAVENOUS

## 2023-05-04 MED ORDER — ACETAMINOPHEN 500 MG PO TABS
1000.0000 mg | ORAL_TABLET | Freq: Once | ORAL | Status: AC
  Administered 2023-05-04: 1000 mg via ORAL
  Filled 2023-05-04: qty 2

## 2023-05-04 MED ORDER — POTASSIUM CHLORIDE CRYS ER 20 MEQ PO TBCR: 20.0000 meq | EXTENDED_RELEASE_TABLET | Freq: Every day | ORAL | Status: DC | PRN

## 2023-05-04 MED ORDER — INSULIN REGULAR(HUMAN) IN NACL 100-0.9 UT/100ML-% IV SOLN
INTRAVENOUS | Status: DC
  Administered 2023-05-04: 4.2 [IU]/h via INTRAVENOUS
  Filled 2023-05-04: qty 100

## 2023-05-04 MED ORDER — ROCURONIUM BROMIDE 10 MG/ML (PF) SYRINGE
PREFILLED_SYRINGE | INTRAVENOUS | Status: DC | PRN
  Administered 2023-05-04: 20 mg via INTRAVENOUS
  Administered 2023-05-04 (×2): 10 mg via INTRAVENOUS
  Administered 2023-05-04: 60 mg via INTRAVENOUS

## 2023-05-04 MED ORDER — MIDAZOLAM HCL 2 MG/2ML IJ SOLN
INTRAMUSCULAR | Status: AC
  Filled 2023-05-04: qty 2

## 2023-05-04 MED ORDER — ROCURONIUM BROMIDE 10 MG/ML (PF) SYRINGE
PREFILLED_SYRINGE | INTRAVENOUS | Status: AC
  Filled 2023-05-04: qty 10

## 2023-05-04 MED ORDER — FENTANYL CITRATE (PF) 100 MCG/2ML IJ SOLN
25.0000 ug | INTRAMUSCULAR | Status: DC | PRN
  Administered 2023-05-04: 50 ug via INTRAVENOUS

## 2023-05-04 MED ORDER — CHLORHEXIDINE GLUCONATE 0.12 % MT SOLN: 15.0000 mL | Freq: Once | OROMUCOSAL | Status: AC

## 2023-05-04 MED ORDER — DEXAMETHASONE SODIUM PHOSPHATE 10 MG/ML IJ SOLN
INTRAMUSCULAR | Status: DC | PRN
  Administered 2023-05-04: 5 mg via INTRAVENOUS

## 2023-05-04 MED ORDER — BISACODYL 10 MG RE SUPP: 10.0000 mg | Freq: Every day | RECTAL | Status: DC | PRN

## 2023-05-04 MED ORDER — CEFAZOLIN SODIUM-DEXTROSE 1-4 GM/50ML-% IV SOLN
INTRAVENOUS | Status: AC
  Filled 2023-05-04: qty 50

## 2023-05-04 MED ORDER — SODIUM CHLORIDE 0.9 % IV SOLN: 500.0000 mL | Freq: Once | INTRAVENOUS | Status: DC | PRN

## 2023-05-04 MED ORDER — HEPARIN SODIUM (PORCINE) 5000 UNIT/ML IJ SOLN
5000.0000 [IU] | Freq: Three times a day (TID) | INTRAMUSCULAR | Status: DC
  Administered 2023-05-05 – 2023-05-06 (×5): 5000 [IU] via SUBCUTANEOUS
  Filled 2023-05-04 (×5): qty 1

## 2023-05-04 MED ORDER — SUGAMMADEX SODIUM 200 MG/2ML IV SOLN
INTRAVENOUS | Status: DC | PRN
  Administered 2023-05-04: 200 mg via INTRAVENOUS

## 2023-05-04 MED ORDER — CHLORHEXIDINE GLUCONATE 0.12 % MT SOLN
OROMUCOSAL | Status: AC
  Administered 2023-05-04: 15 mL via OROMUCOSAL
  Filled 2023-05-04: qty 15

## 2023-05-04 MED ORDER — OXYCODONE HCL 5 MG/5ML PO SOLN: 5.0000 mg | Freq: Once | ORAL | Status: DC | PRN

## 2023-05-04 MED ORDER — MIDAZOLAM HCL 2 MG/2ML IJ SOLN
INTRAMUSCULAR | Status: DC | PRN
  Administered 2023-05-04: 1 mg via INTRAVENOUS

## 2023-05-04 MED ORDER — LACTATED RINGERS IV SOLN: INTRAVENOUS | Status: DC

## 2023-05-04 MED ORDER — SODIUM CHLORIDE 0.9 % IV SOLN: INTRAVENOUS | Status: DC

## 2023-05-04 MED ORDER — CARVEDILOL 6.25 MG PO TABS
6.2500 mg | ORAL_TABLET | Freq: Two times a day (BID) | ORAL | Status: DC
  Administered 2023-05-04 – 2023-05-06 (×4): 6.25 mg via ORAL
  Filled 2023-05-04 (×4): qty 1

## 2023-05-04 MED ORDER — FENTANYL CITRATE (PF) 250 MCG/5ML IJ SOLN
INTRAMUSCULAR | Status: AC
  Filled 2023-05-04: qty 5

## 2023-05-04 MED ORDER — MAGNESIUM SULFATE 2 GM/50ML IV SOLN: 2.0000 g | Freq: Every day | INTRAVENOUS | Status: DC | PRN

## 2023-05-04 SURGICAL SUPPLY — 45 items
ADH SKN CLS APL DERMABOND .7 (GAUZE/BANDAGES/DRESSINGS) ×2
AGENT HMST KT MTR STRL THRMB (HEMOSTASIS)
BAG COUNTER SPONGE SURGICOUNT (BAG) ×2 IMPLANT
BAG SPNG CNTER NS LX DISP (BAG) ×2
CANISTER SUCT 3000ML PPV (MISCELLANEOUS) ×2 IMPLANT
CLIP TI MEDIUM 24 (CLIP) ×2 IMPLANT
CLIP TI WIDE RED SMALL 24 (CLIP) ×2 IMPLANT
DERMABOND ADVANCED .7 DNX12 (GAUZE/BANDAGES/DRESSINGS) ×2 IMPLANT
DRAIN CHANNEL 15F RND FF W/TCR (WOUND CARE) IMPLANT
DRAIN RELI 100 BL SUC LF ST (DRAIN)
DRESSING PEEL AND PLC PRVNA 13 (GAUZE/BANDAGES/DRESSINGS) IMPLANT
DRSG PEEL AND PLACE PREVENA 13 (GAUZE/BANDAGES/DRESSINGS) ×4
ELECT REM PT RETURN 9FT ADLT (ELECTROSURGICAL) ×4
ELECTRODE REM PT RTRN 9FT ADLT (ELECTROSURGICAL) ×2 IMPLANT
EVACUATOR SILICONE 100CC (DRAIN) IMPLANT
GLOVE SURG SS PI 7.5 STRL IVOR (GLOVE) ×6 IMPLANT
GOWN STRL REUS W/ TWL LRG LVL3 (GOWN DISPOSABLE) ×4 IMPLANT
GOWN STRL REUS W/ TWL XL LVL3 (GOWN DISPOSABLE) ×2 IMPLANT
GOWN STRL REUS W/TWL LRG LVL3 (GOWN DISPOSABLE) ×4
GOWN STRL REUS W/TWL XL LVL3 (GOWN DISPOSABLE) ×2
GRAFT HEMASHIELD 8MM (Vascular Products) ×2 IMPLANT
GRAFT SKIN WND MICRO 38 (Tissue) IMPLANT
GRAFT VASC STRG 30X8KNIT (Vascular Products) IMPLANT
HEMOSTAT SNOW SURGICEL 2X4 (HEMOSTASIS) IMPLANT
KIT BASIN OR (CUSTOM PROCEDURE TRAY) ×2 IMPLANT
KIT DRSG PREVENA PLUS 7DAY 125 (MISCELLANEOUS) IMPLANT
KIT TURNOVER KIT B (KITS) ×2 IMPLANT
NS IRRIG 1000ML POUR BTL (IV SOLUTION) ×4 IMPLANT
PACK PERIPHERAL VASCULAR (CUSTOM PROCEDURE TRAY) ×2 IMPLANT
PAD ARMBOARD 7.5X6 YLW CONV (MISCELLANEOUS) ×4 IMPLANT
PENCIL BUTTON HOLSTER BLD 10FT (ELECTRODE) IMPLANT
SURGIFLO W/THROMBIN 8M KIT (HEMOSTASIS) IMPLANT
SUT MNCRL AB 4-0 PS2 18 (SUTURE) IMPLANT
SUT PROLENE 5 0 C 1 24 (SUTURE) ×4 IMPLANT
SUT PROLENE 6 0 BV (SUTURE) ×2 IMPLANT
SUT PROLENE 7 0 BV1 MDA (SUTURE) IMPLANT
SUT VIC AB 2-0 CT1 27 (SUTURE) ×4
SUT VIC AB 2-0 CT1 TAPERPNT 27 (SUTURE) ×4 IMPLANT
SUT VIC AB 3-0 SH 27 (SUTURE) ×6
SUT VIC AB 3-0 SH 27X BRD (SUTURE) ×4 IMPLANT
SUT VICRYL 4-0 PS2 18IN ABS (SUTURE) IMPLANT
TAPE UMBILICAL 1/8X30 (MISCELLANEOUS) ×2 IMPLANT
TOWEL GREEN STERILE (TOWEL DISPOSABLE) ×2 IMPLANT
TRAY FOLEY MTR SLVR 16FR STAT (SET/KITS/TRAYS/PACK) ×2 IMPLANT
WATER STERILE IRR 1000ML POUR (IV SOLUTION) ×2 IMPLANT

## 2023-05-04 NOTE — Progress Notes (Signed)
Endo Tool started per Dr. Stephannie Peters order

## 2023-05-04 NOTE — Anesthesia Procedure Notes (Cosign Needed)
Procedure Name: Intubation Date/Time: 05/04/2023 11:15 AM  Performed by: Kaylyn Layer, MDPre-anesthesia Checklist: Patient identified, Emergency Drugs available, Suction available and Patient being monitored Patient Re-evaluated:Patient Re-evaluated prior to induction Oxygen Delivery Method: Circle system utilized Preoxygenation: Pre-oxygenation with 100% oxygen Induction Type: IV induction Ventilation: Mask ventilation without difficulty and Oral airway inserted - appropriate to patient size Laryngoscope Size: Mac and 3 Grade View: Grade I Tube type: Oral Number of attempts: 1 Airway Equipment and Method: Stylet and Oral airway Placement Confirmation: ETT inserted through vocal cords under direct vision, positive ETCO2 and breath sounds checked- equal and bilateral Secured at: 21 cm Tube secured with: Tape Dental Injury: Teeth and Oropharynx as per pre-operative assessment

## 2023-05-04 NOTE — Discharge Instructions (Signed)
° °[facility] [place] ° °Discharge instructions ° °Lower Extremity Bypass Surgery ° °Please refer to the following instruction for your post-procedure care. Your surgeon or physician assistant will discuss any changes with you. ° °Activity ° °You are encouraged to walk as much as you can. You can slowly return to normal activities during the month after your surgery. Avoid strenuous activity and heavy lifting until your doctor tells you it's OK. Avoid activities such as vacuuming or swinging a golf club. Do not drive until your doctor give the OK and you are no longer taking prescription pain medications. It is also normal to have difficulty with sleep habits, eating and bowel movement after surgery. These will go away with time. ° °Bathing/Showering ° °Shower daily after you go home. Do not soak in a bathtub, hot tub, or swim until the incision heals completely. ° °Incision Care ° °Clean your incision with mild soap and water. Shower every day. Pat the area dry with a clean towel. You do not need a bandage unless otherwise instructed. Do not apply any ointments or creams to your incision. If you have open wounds you will be instructed how to care for them or a visiting nurse may be arranged for you. If you have staples or sutures along your incision they will be removed at your post-op appointment. You may have skin glue on your incision. Do not peel it off. It will come off on its own in about one week. ° °Keep Pravena wound vac on your groin incision until it loses it seal in about 7-10 days.  Once that happens, you can remove and then wash the groin wound with soap and water daily and pat dry. (No tub bath-only shower)  Then put a dry gauze or washcloth in the groin to keep this area dry to help prevent wound infection.  Do this daily and as needed.  Do not use Vaseline or neosporin on your incisions.  Only use soap and water on your incisions and then protect and keep  dry. ° ° °Diet ° °Resume your normal diet. There are no special food restrictions following this procedure. A low fat/ low cholesterol diet is recommended for all patients with vascular disease. In order to heal from your surgery, it is CRITICAL to get adequate nutrition. Your body requires vitamins, minerals, and protein. Vegetables are the best source of vitamins and minerals. Vegetables also provide the perfect balance of protein. Processed food has little nutritional value, so try to avoid this. ° °Medications ° °Resume taking all your medications unless your doctor or physician assistant tells you not to. If your incision is causing pain, you may take over-the-counter pain relievers such as acetaminophen (Tylenol). If you were prescribed a stronger pain medication, please aware these medication can cause nausea and constipation. Prevent nausea by taking the medication with a snack or meal. Avoid constipation by drinking plenty of fluids and eating foods with high amount of fiber, such as fruits, vegetables, and grains. Take Colace 100 mg (an over-the-counter stool softener) twice a day as needed for constipation.  ° °Do not take Tylenol if you are taking prescription pain medications. ° °Follow Up ° °Our office will schedule a follow up appointment 2-3 weeks following discharge. ° °Please call us immediately for any of the following conditions ° °•Severe or worsening pain in your legs or feet while at rest or while walking •Increase pain, redness, warmth, or drainage (pus) from your incision site(s) °• Fever   of 101 degree or higher °• The swelling in your leg with the bypass suddenly worsens and becomes more painful than when you were in the hospital °• If you have been instructed to feel your graft pulse then you should do so every day. If you can no longer feel this pulse, call the office immediately. Not all patients are given this instruction. °•  °Leg swelling is common after leg bypass surgery. ° °The  swelling should improve over a few months following surgery. To improve the swelling, you may elevate your legs above the level of your heart while you are sitting or resting. Your surgeon or physician assistant may ask you to apply an ACE wrap or wear compression (TED) stockings to help to reduce swelling. ° °Reduce your risk of vascular disease ° °Stop smoking. If you would like help call [facility] at 1-800-QUIT-NOW (1-800-784-8669) or [place] at 336-586-4000. ° °• Manage your cholesterol °• Maintain a desired weight °• Control your diabetes weight °• Control your diabetes °• Keep your blood pressure down °•  °If you have any questions, please call the office at [phone] ° °

## 2023-05-04 NOTE — Interval H&P Note (Signed)
History and Physical Interval Note:  05/04/2023 10:25 AM  Alice Bennett  has presented today for surgery, with the diagnosis of Critical limb ischemia with tissue loss.  The various methods of treatment have been discussed with the patient and family. After consideration of risks, benefits and other options for treatment, the patient has consented to  Procedure(s): LEFT TO RIGHT FEMORAL-FEMORAL ARTERY BYPASS (Bilateral) as a surgical intervention.  The patient's history has been reviewed, patient examined, no change in status, stable for surgery.  I have reviewed the patient's chart and labs.  Questions were answered to the patient's satisfaction.     Durene Cal

## 2023-05-04 NOTE — Anesthesia Procedure Notes (Signed)
Arterial Line Insertion Start/End6/26/2024 10:24 AM, 05/04/2023 10:27 AM Performed by: Kaylyn Layer, MD, Randon Goldsmith, CRNA, anesthesiologist  Patient location: Pre-op. Preanesthetic checklist: patient identified, IV checked, site marked, risks and benefits discussed, surgical consent, monitors and equipment checked, pre-op evaluation, timeout performed and anesthesia consent Lidocaine 1% used for infiltration Right, radial was placed Catheter size: 20 G Hand hygiene performed  and maximum sterile barriers used   Procedure performed using ultrasound guided technique. Ultrasound Notes:anatomy identified, needle tip was noted to be adjacent to the nerve/plexus identified, no ultrasound evidence of intravascular and/or intraneural injection and image(s) printed for medical record Following insertion, dressing applied and Biopatch. Post procedure assessment: normal and unchanged  Post procedure complications: unsuccessful attempts and second provider assisted. Patient tolerated the procedure well with no immediate complications. Additional procedure comments: Several attempts to right radial artery unsuccessful by CRNA. One attempt by myself with US guidance. Stephannie Peters, MD.

## 2023-05-04 NOTE — Transfer of Care (Signed)
Immediate Anesthesia Transfer of Care Note  Patient: Alice Bennett  Procedure(s) Performed: LEFT TO RIGHT FEMORAL-FEMORAL ARTERY BYPASS (Bilateral) APPLICATION OF WOUND VAC (Bilateral: Groin) APPLICATION OF SKIN SUBSTITUTE KERECIS 38SQ CM (Bilateral: Groin)  Patient Location: PACU  Anesthesia Type:General  Level of Consciousness: awake, drowsy, and patient cooperative  Airway & Oxygen Therapy: Patient Spontanous Breathing and Patient connected to nasal cannula oxygen  Post-op Assessment: Report given to RN and Post -op Vital signs reviewed and stable  Post vital signs: Reviewed and stable  Last Vitals:  Vitals Value Taken Time  BP 158/58 05/04/23 1430  Temp    Pulse 62 05/04/23 1431  Resp 12 05/04/23 1431  SpO2 91 % 05/04/23 1431  Vitals shown include unvalidated device data.  Last Pain:  Vitals:   05/04/23 0949  TempSrc:   PainSc: 10-Worst pain ever      Patients Stated Pain Goal: 0 (05/03/23 1610)  Complications: No notable events documented.

## 2023-05-04 NOTE — Anesthesia Postprocedure Evaluation (Signed)
Anesthesia Post Note  Patient: Koby Hartfield Swalley  Procedure(s) Performed: LEFT TO RIGHT FEMORAL-FEMORAL ARTERY BYPASS (Bilateral) APPLICATION OF WOUND VAC (Bilateral: Groin) APPLICATION OF SKIN SUBSTITUTE KERECIS 38SQ CM (Bilateral: Groin)     Patient location during evaluation: PACU Anesthesia Type: General Level of consciousness: awake and alert Pain management: pain level controlled Vital Signs Assessment: post-procedure vital signs reviewed and stable Respiratory status: spontaneous breathing, nonlabored ventilation and respiratory function stable Cardiovascular status: blood pressure returned to baseline Postop Assessment: no apparent nausea or vomiting Anesthetic complications: no   No notable events documented.  Last Vitals:  Vitals:   05/04/23 1533 05/04/23 1545  BP: (!) 139/55 (!) 149/59  Pulse: 64 60  Resp: 14 13  Temp: 36.6 C   SpO2: 95% 95%    Last Pain:  Vitals:   05/04/23 1558  TempSrc:   PainSc: 8                  Shanda Howells

## 2023-05-04 NOTE — Anesthesia Preprocedure Evaluation (Addendum)
Anesthesia Evaluation  Patient identified by MRN, date of birth, ID band Patient awake    Reviewed: Allergy & Precautions, NPO status , Patient's Chart, lab work & pertinent test results, reviewed documented beta blocker date and time   History of Anesthesia Complications Negative for: history of anesthetic complications  Airway Mallampati: II  TM Distance: >3 FB Neck ROM: Full    Dental no notable dental hx.    Pulmonary Current Smoker and Patient abstained from smoking.   Pulmonary exam normal        Cardiovascular hypertension, Pt. on medications and Pt. on home beta blockers + CAD, + Past MI and + Peripheral Vascular Disease  Normal cardiovascular exam  TTE 05/2022: Mid/distal septal hypokinesis, EF 50-55%, mild LVH, mild pHTN, moderate LAE, mild AR    Neuro/Psych negative neurological ROS     GI/Hepatic Neg liver ROS,GERD  Medicated,,  Endo/Other  diabetes (On Ozempic), Type 2, Oral Hypoglycemic Agents    Renal/GU negative Renal ROS     Musculoskeletal negative musculoskeletal ROS (+)    Abdominal   Peds  Hematology negative hematology ROS (+)   Anesthesia Other Findings Day of surgery medications reviewed with patient.  Reproductive/Obstetrics                             Anesthesia Physical Anesthesia Plan  ASA: 3  Anesthesia Plan: General   Post-op Pain Management: Tylenol PO (pre-op)*   Induction: Intravenous  PONV Risk Score and Plan: 2 and Treatment may vary due to age or medical condition, Midazolam, Dexamethasone and Ondansetron  Airway Management Planned: Oral ETT  Additional Equipment: Arterial line  Intra-op Plan:   Post-operative Plan: Extubation in OR  Informed Consent: I have reviewed the patients History and Physical, chart, labs and discussed the procedure including the risks, benefits and alternatives for the proposed anesthesia with the patient or  authorized representative who has indicated his/her understanding and acceptance.     Dental advisory given  Plan Discussed with: CRNA  Anesthesia Plan Comments:         Anesthesia Quick Evaluation

## 2023-05-04 NOTE — Progress Notes (Signed)
  Progress Note    05/04/2023 6:43 AM Hospital Day 5  Subjective:  still having pain right foot.    Tm 99.2 now 99.1  Vitals:   05/03/23 2311 05/04/23 0438  BP:  (!) 129/47  Pulse:  66  Resp:  14  Temp: 99.2 F (37.3 C) 99.1 F (37.3 C)  SpO2:  96%    Physical Exam: General:  no distress Lungs:  non labored Extremities:  right foot appears unchanged from previous days.  CBC    Component Value Date/Time   WBC 5.3 05/04/2023 0126   RBC 3.26 (L) 05/04/2023 0126   HGB 8.8 (L) 05/04/2023 0126   HGB 13.8 07/07/2022 1531   HCT 27.3 (L) 05/04/2023 0126   HCT 41.8 07/07/2022 1531   PLT 163 05/04/2023 0126   PLT 192 07/07/2022 1531   MCV 83.7 05/04/2023 0126   MCV 86 07/07/2022 1531   MCH 27.0 05/04/2023 0126   MCHC 32.2 05/04/2023 0126   RDW 14.4 05/04/2023 0126   RDW 14.2 07/07/2022 1531   LYMPHSABS 1.1 02/06/2023 0126   MONOABS 0.5 02/06/2023 0126   EOSABS 0.0 02/06/2023 0126   BASOSABS 0.0 02/06/2023 0126    BMET    Component Value Date/Time   NA 132 (L) 05/04/2023 0126   NA 142 07/07/2022 1531   K 3.6 05/04/2023 0126   CL 98 05/04/2023 0126   CO2 25 05/04/2023 0126   GLUCOSE 166 (H) 05/04/2023 0126   BUN 13 05/04/2023 0126   BUN 8 07/07/2022 1531   CREATININE 0.99 05/04/2023 0126   CALCIUM 8.1 (L) 05/04/2023 0126   GFRNONAA >60 05/04/2023 0126   GFRAA 53 (L) 06/09/2020 0331    INR    Component Value Date/Time   INR 0.93 07/10/2017 1106     Intake/Output Summary (Last 24 hours) at 05/04/2023 0643 Last data filed at 05/04/2023 0600 Gross per 24 hour  Intake 972.79 ml  Output --  Net 972.79 ml     Assessment/Plan:  62 y.o. female  s/p  right common and external iliac artery stenting by Dr. Lenell Antu on 04/22/2023 due to thrombosed right iliac system with rest pain and toe discoloration of the right foot.  Previously she had right common iliac artery stenting by Dr. Gery Pray in September 2023.  This stent thrombosed and she underwent percutaneous  thrombectomy and stenting of the right iliac by Dr. Myra Gianotti in March 2024.    She now has rest pain and discolored toes on the right foot and numbness.  She is scheduled for left to right femoral to femoral bypass grafting with Dr. Myra Gianotti 6/26 to allow Xarelto to wash out.  She was admitted yesterday for IV heparin     Hospital Day 5  -plan for OR today for revascularization.  Pt is npo.    Doreatha Massed, PA-C [facility] [phone] 05/04/2023 6:43 AM

## 2023-05-04 NOTE — Progress Notes (Signed)
  Day of Surgery Note    Subjective:  says she feels a little better   Vitals:   05/04/23 1900 05/04/23 2000  BP: (!) 124/46 (!) 133/59  Pulse: 60   Resp: 12 20  Temp: 98.2 F (36.8 C)   SpO2: 98% 96%    Incisions:   bilateral groins with prevena vac with good seal bilaterally Extremities:  brisk monophasic doppler flow right DP/PT >peroneal; left PT brisk biphasic; right calf and anterior compartments are soft and non tender Cardiac:  regular Lungs:  non labored Abdomen:  soft   Assessment/Plan:  This is a 62 y.o. female who is s/p  Left to right femoral to femoral bypass graft with 8mm Dacron and placement of prevena incisional vacs bilaterally  -pt with brisk doppler flow bilateral feet and subjectively some better. -right calf and anterior compartments are soft -bilateral prevenas with good seal -pt to start sq heparin in am for DVT prophylaxis.   Doreatha Massed, PA-C 05/04/2023 8:54 PM [phone]

## 2023-05-04 NOTE — Op Note (Addendum)
Patient name: Alice Bennett MRN: [id number] DOB: 13-Oct-1961 Sex: female  05/04/2023 Pre-operative Diagnosis: right leg ischemia Post-operative diagnosis:  Same Surgeon:  Durene Cal Assistants:  Doreatha Massed, PA Procedure:   #1: Left to right femoral-femoral graft with 8 mm   #2: Placement of the first 25 cm of skin substitute (Kerecis)   #3: Placement of bilateral wound VAC (Prevena)   #4: Right femoral endarterectomy Anesthesia:  General Blood Loss:  100cc Specimens:  none  Findings: Femoral anastomosis.  The superficial approximately centimeters.  Similar pulmonary tract.  Indications:  This is a 62 year old female who has previously undergone angiogram of right leg.  This is occluded on 2 separate occasions.  She has been having rest pain and skin discoloration of the toes for about a week.  She was admitted to the hospital for conversion of her anticoagulation.  She comes in today for a left right femoral-femoral bypass graft.  I contemplated an aortobifemoral graft as a think this would be more durable, however with her comorbidities that she would do well. Procedure:  The patient was identified in the holding area and taken to [facility] OR ROOM 11  The patient was then placed supine on the table. general anesthesia was administered.  The patient was prepped and draped in the usual sterile fashion.  A time out was called and antibiotics were administered.  A PA was necessary to expedite the procedure and assist with technical details  She helped with exposure by providing suction and retraction. She helped with the anastomosis by following the suture.  She helped with closure.  I began by making bilateral oblique incisions in the groin.  Cautery was used to divide subcutaneous tissue down to the femoral sheath which was opened sharply.  Status post bilateral common femoral arteries from the inguinal ligament down to the bifurcation.  The superficial femoral and profundofemoral arteries  were individually isolated.  On the left, there did appear to be a proglide closure device that was at the origin of the superficial femoral artery which may explain her stenosis.  Both groins had a fair amount of scar tissue.  With to have adequate exposure, using my fingers I created a tunnel between the 2 groin incisions in the suprapubic location just anterior to the abdominal wall fascia.  An 8 mm dacryon graft was brought through the tunnel.  The patient was fully heparinized.  I first began in the right groin.  The arteries were occluded with vascular clamps and a #11 blade was used to make an arteriotomy.  This began in the distal common femoral artery and extended onto the superficial femoral artery for approximately 5 mm.  The graft was beveled for the size the arteriotomy a running anastomosis was created with 6-0 Prolene.  Prior to completion, the appropriate flushing maneuvers were performed and the anastomosis was completed.  There was excellent flow through the graft.  Next attention was turned to the right groin.  I can feel the stent within the proximal common femoral.  Vessels were occluded with clamps and a #11 blade was used to make an arteriotomy which was extended longitudinally with Potts scissors.  The arteriotomy went down onto the origin of the superficial femoral artery.  I did have to perform endarterectomy of the common femoral artery.  I tacked down the distal endpoint with 7-0 Prolene sutures.  The graft was then cut the appropriate length and beveled to size the arteriotomy.  A running anastomosis created with  6-0 Prolene.  Prior to completion, the appropriate flushing maneuvers were performed and the anastomosis was completed.  The patient had excellent Doppler signals in the groin as well as at the ankles.  The graft lay very well over top of the inguinal ligament.  The patient's heparin was reversed with 50 mg of protamine.  I reapproximated the femoral sheath with a running  2-0 Vicryl.  On both sides, Kerecis powder was placed in between layers of closure.  The skin was closed with Monocryl followed by Prevena wound VAC placement.  Disposition: To PACU stable   V. Durene Cal, M.D., [facility] [place] Office: [phone] Pager:  [phone]

## 2023-05-05 ENCOUNTER — Encounter (HOSPITAL_COMMUNITY): Payer: Self-pay | Admitting: Surgery

## 2023-05-05 LAB — CBC
HCT: 25.8 % — ABNORMAL LOW (ref 36.0–46.0)
Hemoglobin: 8.2 g/dL — ABNORMAL LOW (ref 12.0–15.0)
MCH: 27 pg (ref 26.0–34.0)
MCHC: 31.8 g/dL (ref 30.0–36.0)
MCV: 84.9 fL (ref 80.0–100.0)
Platelets: 171 10*3/uL (ref 150–400)
RBC: 3.04 MIL/uL — ABNORMAL LOW (ref 3.87–5.11)
RDW: 14.7 % (ref 11.5–15.5)
WBC: 5.7 10*3/uL (ref 4.0–10.5)
nRBC: 0 % (ref 0.0–0.2)

## 2023-05-05 LAB — GLUCOSE, CAPILLARY
Glucose-Capillary: 168 mg/dL — ABNORMAL HIGH (ref 70–99)
Glucose-Capillary: 218 mg/dL — ABNORMAL HIGH (ref 70–99)
Glucose-Capillary: 161 mg/dL — ABNORMAL HIGH (ref 70–99)
Glucose-Capillary: 122 mg/dL — ABNORMAL HIGH (ref 70–99)

## 2023-05-05 LAB — POCT ACTIVATED CLOTTING TIME
Activated Clotting Time: 256 seconds
Activated Clotting Time: 244 seconds

## 2023-05-05 LAB — BASIC METABOLIC PANEL
Anion gap: 8 (ref 5–15)
BUN: 16 mg/dL (ref 8–23)
CO2: 24 mmol/L (ref 22–32)
Calcium: 8.1 mg/dL — ABNORMAL LOW (ref 8.9–10.3)
Chloride: 102 mmol/L (ref 98–111)
Creatinine, Ser: 1.07 mg/dL — ABNORMAL HIGH (ref 0.44–1.00)
GFR, Estimated: 59 mL/min — ABNORMAL LOW (ref >60)
Glucose, Bld: 203 mg/dL — ABNORMAL HIGH (ref 70–99)
Potassium: 3.9 mmol/L (ref 3.5–5.1)
Sodium: 134 mmol/L — ABNORMAL LOW (ref 135–145)

## 2023-05-05 MED ORDER — TICAGRELOR 90 MG PO TABS
90.0000 mg | ORAL_TABLET | Freq: Two times a day (BID) | ORAL | Status: DC
  Administered 2023-05-05 – 2023-05-06 (×3): 90 mg via ORAL
  Filled 2023-05-05 (×3): qty 1

## 2023-05-05 NOTE — Evaluation (Signed)
Physical Therapy Evaluation Patient Details Name: Alice Bennett MRN: [id number] DOB: 1961/05/13 Today's Date: 05/05/2023  History of Present Illness  62 y.o. female s/p  right common and external iliac artery stenting by Dr. Lenell Antu on 04/22/2023 due to thrombosed right iliac system with rest pain and toe discoloration of the right foot. S/p 6/26 Left to right femoral to femoral bypass graft PMH: CAD, NSTEMI, HTN, DM2,  Clinical Impression  PTA pt independent with ambulation, ADLs, and iADLs, mobility distance limited by claudication pain. Pt to go to daughter's single story home with 3 steps to enter on discharge. Pt limited in mobility by pain at bilateral groin incision sites, R>L, in presence of generalized weakness and decreased endurance. Pt is min guard for all mobility today. Pt will not need post acute PT services but will continue to benefit from PT in the acute setting. PT referral to mobility specialist to maximize mobilization.        Recommendations for follow up therapy are one component of a multi-disciplinary discharge planning process, led by the attending physician.  Recommendations may be updated based on patient status, additional functional criteria and insurance authorization.     Assistance Recommended at Discharge Intermittent Supervision/Assistance  Patient can return home with the following  Assistance with cooking/housework;Assist for transportation;Help with stairs or ramp for entrance    Equipment Recommendations Rolling walker (2 wheels);BSC/3in1;Other (comment) (tub bench)  Recommendations for Other Services       Functional Status Assessment Patient has had a recent decline in their functional status and demonstrates the ability to make significant improvements in function in a reasonable and predictable amount of time.     Precautions / Restrictions Precautions Precautions: None Precaution Comments: bilateral groin wound vac Restrictions Weight Bearing  Restrictions: No      Mobility  Bed Mobility Overal bed mobility: Needs Assistance Bed Mobility: Supine to Sit     Supine to sit: Min guard, HOB elevated     General bed mobility comments: min guard for safety, increased time and effort to come to EoB, use of bedrails    Transfers Overall transfer level: Needs assistance Equipment used: Rolling walker (2 wheels) Transfers: Sit to/from Stand Sit to Stand: Min guard           General transfer comment: min guard for safety for power up from elevated bed, use of grab bars in bathroom to stand from low toilet seat    Ambulation/Gait Ambulation/Gait assistance: Min guard Gait Distance (Feet): 250 Feet Assistive device: Rolling walker (2 wheels) Gait Pattern/deviations: Step-through pattern, WFL(Within Functional Limits) Gait velocity: below normal Gait velocity interpretation: 1.31 - 2.62 ft/sec, indicative of limited community ambulator   General Gait Details: min guard for safety with slowed, mildly unsteady gait, no over LoB, use of UE on RW for regulating weightbearing through LE      Balance Overall balance assessment: Mild deficits observed, not formally tested                                           Pertinent Vitals/Pain Pain Assessment Pain Assessment: 0-10 Pain Score: 9  Pain Location: R groin incision Pain Descriptors / Indicators: Burning, Grimacing, Guarding, Discomfort, Throbbing Pain Intervention(s): Limited activity within patient's tolerance, Monitored during session, Repositioned    Home Living Family/patient expects to be discharged to:: Private residence Living Arrangements: Children Marcelino Duster- daughter) Available Help at  Discharge: Available 24 hours/day Type of Home: House Home Access: Stairs to enter   [employer] of Steps: 3   Home Layout: One level Home Equipment: None      Prior Function Prior Level of Function : Independent/Modified  Independent;Driving                        Extremity/Trunk Assessment   Upper Extremity Assessment Upper Extremity Assessment: Defer to OT evaluation    Lower Extremity Assessment Lower Extremity Assessment: RLE deficits/detail;LLE deficits/detail RLE Deficits / Details: groin incision, with wound vac, decreased dorsiflexion, increased pain with movement, strength in hip and knee 4+/5, ankle dorsi 2+/5, plantar 3+/5 LLE Deficits / Details: groin incision with wound vac, limited hip ROM otherwise WFL    Cervical / Trunk Assessment Cervical / Trunk Assessment: Kyphotic  Communication   Communication: No difficulties  Cognition Arousal/Alertness: Awake/alert Behavior During Therapy: WFL for tasks assessed/performed Overall Cognitive Status: Within Functional Limits for tasks assessed                                          General Comments General comments (skin integrity, edema, etc.): VSS on RA, bilateral wound vacs in place, minor drainage, daughter present throughout session        Assessment/Plan    PT Assessment Patient needs continued PT services  PT Problem List Decreased range of motion;Decreased activity tolerance;Decreased balance;Cardiopulmonary status limiting activity;Pain       PT Treatment Interventions DME instruction;Gait training;Functional mobility training;Stair training;Therapeutic activities;Therapeutic exercise;Balance training;Cognitive remediation;Patient/family education    PT Goals (Current goals can be found in the Care Plan section)  Acute Rehab PT Goals PT Goal Formulation: With patient/family Time For Goal Achievement: 05/19/23 Potential to Achieve Goals: Good    Frequency Min 1X/week        AM-PAC PT "6 Clicks" Mobility  Outcome Measure Help needed turning from your back to your side while in a flat bed without using bedrails?: None Help needed moving from lying on your back to sitting on the side of a flat  bed without using bedrails?: None Help needed moving to and from a bed to a chair (including a wheelchair)?: None Help needed standing up from a chair using your arms (e.g., wheelchair or bedside chair)?: A Little Help needed to walk in hospital room?: A Little Help needed climbing 3-5 steps with a railing? : A Little 6 Click Score: 21    End of Session Equipment Utilized During Treatment: Gait belt Activity Tolerance: Patient tolerated treatment well Patient left: in chair;with call bell/phone within reach Nurse Communication: Mobility status PT Visit Diagnosis: Other abnormalities of gait and mobility (R26.89);Difficulty in walking, not elsewhere classified (R26.2);Pain Pain - part of body:  (bilateral grion, R>L)    Time: 6295-2841 PT Time Calculation (min) (ACUTE ONLY): 42 min   Charges:   PT Evaluation $PT Eval Moderate Complexity: 1 Mod PT Treatments $Gait Training: 8-22 mins $Therapeutic Activity: 8-22 mins        Deanna Wiater B. Beverely Risen PT, DPT Acute Rehabilitation Services Please use secure chat or  Call Office [phone]   Elon Alas [facility] 05/05/2023, 12:04 PM

## 2023-05-05 NOTE — Progress Notes (Addendum)
  Progress Note    05/05/2023 5:32 AM 1 Day Post-Op  Subjective:  says her foot is still feeling better.   Afebrile HR 40's-60's SB 110's-120's 96% RA  Vitals:   05/05/23 0200 05/05/23 0400  BP: 131/79 116/67  Pulse:  (!) 50  Resp: 18 16  Temp:    SpO2: 95% 96%    Physical Exam: General:  no distress Cardiac:  regular Lungs:  non labored Incisions:  bilateral prevena vacs with good seal; right foot is warm with motor and sensory in tact. Extremities:  right DP pulse is faintly palpable Abdomen:  soft  CBC    Component Value Date/Time   WBC 5.7 05/05/2023 0139   RBC 3.04 (L) 05/05/2023 0139   HGB 8.2 (L) 05/05/2023 0139   HGB 13.8 07/07/2022 1531   HCT 25.8 (L) 05/05/2023 0139   HCT 41.8 07/07/2022 1531   PLT 171 05/05/2023 0139   PLT 192 07/07/2022 1531   MCV 84.9 05/05/2023 0139   MCV 86 07/07/2022 1531   MCH 27.0 05/05/2023 0139   MCHC 31.8 05/05/2023 0139   RDW 14.7 05/05/2023 0139   RDW 14.2 07/07/2022 1531   LYMPHSABS 1.1 02/06/2023 0126   MONOABS 0.5 02/06/2023 0126   EOSABS 0.0 02/06/2023 0126   BASOSABS 0.0 02/06/2023 0126    BMET    Component Value Date/Time   NA 134 (L) 05/05/2023 0139   NA 142 07/07/2022 1531   K 3.9 05/05/2023 0139   CL 102 05/05/2023 0139   CO2 24 05/05/2023 0139   GLUCOSE 203 (H) 05/05/2023 0139   BUN 16 05/05/2023 0139   BUN 8 07/07/2022 1531   CREATININE 1.07 (H) 05/05/2023 0139   CALCIUM 8.1 (L) 05/05/2023 0139   GFRNONAA 59 (L) 05/05/2023 0139   GFRAA 53 (L) 06/09/2020 0331    INR    Component Value Date/Time   INR 0.93 07/10/2017 1106     Intake/Output Summary (Last 24 hours) at 05/05/2023 0532 Last data filed at 05/05/2023 0300 Gross per 24 hour  Intake 2682.79 ml  Output 1600 ml  Net 1082.79 ml      Assessment/Plan:  62 y.o. female is s/p:  Left to right femoral to femoral bypass graft with 8mm Dacron and placement of prevena incisional vacs bilaterally   1 Day Post-Op   -pt doing well  this morning with faintly palpable right DP pulse.  Right calf remains soft.  Prevena vacs in tact with good seal bilaterally.   -continue to increase mobilization -discussed prevena vac with pt. -DVT prophylaxis:  sq heparin   Doreatha Massed, PA-C [facility] [phone] 05/05/2023 5:32 AM   I agree with the above.  Have seen and evaluated patient.  She is postoperative #1 from a left right femoral-femoral bypass graft.  She has brisk dorsalis pedis and posterior tibial Doppler signals.  Her foot still remains discolored on the tips of her toes.  Her feeling and sensation are improved.  We will plan on mobilizing her today.  She will be restarted on her Brilinta.  Hopefully discharge in 1 to 2 days  Wells Chayim Bialas

## 2023-05-05 NOTE — Evaluation (Signed)
Occupational Therapy Evaluation Patient Details Name: Alice Bennett MRN: [id number] DOB: 06-14-1961 Today's Date: 05/05/2023   History of Present Illness 62 y.o. female s/p  right common and external iliac artery stenting by Dr. Lenell Bennett on 04/22/2023 due to thrombosed right iliac system with rest pain and toe discoloration of the right foot. S/p 6/26 Left to right femoral to femoral bypass graft PMH: CAD, NSTEMI, HTN, DM2,   Clinical Impression   Patient admitted for the diagnosis above.  PTA she lives at home with family, and needed no assist with ADL, iADL or mobility.  Pain to her R leg is the primary deficit, currently she is needing up to CGA for mobility, and Min A with lower body ADL.  Patient is unable to bring R leg up in figure four as of yet.  OT will follow in the acute setting, but no post acute OT is anticipated.       Recommendations for follow up therapy are one component of a multi-disciplinary discharge planning process, led by the attending physician.  Recommendations may be updated based on patient status, additional functional criteria and insurance authorization.   Assistance Recommended at Discharge Set up Supervision/Assistance  Patient can return home with the following Assist for transportation;Assistance with cooking/housework    Functional Status Assessment  Patient has had a recent decline in their functional status and demonstrates the ability to make significant improvements in function in a reasonable and predictable amount of time.  Equipment Recommendations  Tub/shower seat    Recommendations for Other Services       Precautions / Restrictions Precautions Precautions: None Precaution Comments: bilateral groin wound vac Restrictions Weight Bearing Restrictions: No      Mobility Bed Mobility Overal bed mobility: Needs Assistance Bed Mobility: Supine to Sit, Sit to Supine     Supine to sit: Supervision Sit to supine: Supervision         Transfers Overall transfer level: Needs assistance Equipment used: Rolling walker (2 wheels) Transfers: Sit to/from Stand Sit to Stand: Supervision                  Balance Overall balance assessment: Mild deficits observed, not formally tested                                         ADL either performed or assessed with clinical judgement   ADL       Grooming: Supervision/safety;Standing               Lower Body Dressing: Minimal assistance;Sit to/from stand   Toilet Transfer: Supervision/safety;Rolling walker (2 wheels);Regular Toilet                   Vision Patient Visual Report: No change from baseline       Perception     Praxis      Pertinent Vitals/Pain Pain Assessment Pain Assessment: Faces Faces Pain Scale: Hurts little more Pain Location: R groin incision Pain Descriptors / Indicators: Tender Pain Intervention(s): Monitored during session     Hand Dominance Right   Extremity/Trunk Assessment Upper Extremity Assessment Upper Extremity Assessment: Overall WFL for tasks assessed   Lower Extremity Assessment Lower Extremity Assessment: Defer to PT evaluation       Communication Communication Communication: No difficulties   Cognition Arousal/Alertness: Awake/alert Behavior During Therapy: WFL for tasks assessed/performed Overall Cognitive Status: Within Functional Limits for  tasks assessed                                       General Comments   VSS on RA    Exercises     Shoulder Instructions      Home Living Family/patient expects to be discharged to:: Private residence Living Arrangements: Children Available Help at Discharge: Available 24 hours/day Type of Home: House Home Access: Stairs to enter [employer] of Steps: 3   Home Layout: One level     Bathroom Shower/Tub: Chief Strategy Officer: Standard Bathroom Accessibility: No   Home Equipment:  None          Prior Functioning/Environment Prior Level of Function : Independent/Modified Independent;Driving                        OT Problem List: Decreased range of motion;Pain;Increased edema      OT Treatment/Interventions: Self-care/ADL training;Therapeutic activities;Balance training;Patient/family education;DME and/or AE instruction    OT Goals(Current goals can be found in the care plan section) Acute Rehab OT Goals Patient Stated Goal: Return home OT Goal Formulation: With patient Time For Goal Achievement: 05/19/23 Potential to Achieve Goals: Good ADL Goals Pt Will Perform Grooming: with modified independence;standing Pt Will Perform Lower Body Dressing: with modified independence;sit to/from stand;with adaptive equipment Pt Will Transfer to Toilet: with modified independence;ambulating;regular height toilet  OT Frequency: Min 2X/week    Co-evaluation              AM-PAC OT "6 Clicks" Daily Activity     Outcome Measure Help from another person eating meals?: None Help from another person taking care of personal grooming?: A Little Help from another person toileting, which includes using toliet, bedpan, or urinal?: A Little Help from another person bathing (including washing, rinsing, drying)?: A Little Help from another person to put on and taking off regular upper body clothing?: None Help from another person to put on and taking off regular lower body clothing?: A Little 6 Click Score: 20   End of Session Equipment Utilized During Treatment: Rolling walker (2 wheels) Nurse Communication: Mobility status  Activity Tolerance: Patient tolerated treatment well Patient left: in bed;with call bell/phone within reach  OT Visit Diagnosis: Unsteadiness on feet (R26.81);Pain Pain - Right/Left: Right Pain - part of body: Ankle and joints of foot;Leg                Time: 1435-1455 OT Time Calculation (min): 20 min Charges:  OT General Charges $OT  Visit: 1 Visit OT Evaluation $OT Eval Moderate Complexity: 1 Mod  05/05/2023  RP, OTR/L  Acute Rehabilitation Services  Office:  [phone]   Suzanna Obey 05/05/2023, 3:00 PM

## 2023-05-05 NOTE — Telephone Encounter (Signed)
Appt has been scheduled.

## 2023-05-06 LAB — GLUCOSE, CAPILLARY
Glucose-Capillary: 192 mg/dL — ABNORMAL HIGH (ref 70–99)
Glucose-Capillary: 197 mg/dL — ABNORMAL HIGH (ref 70–99)

## 2023-05-06 MED ORDER — SULFAMETHOXAZOLE-TRIMETHOPRIM 800-160 MG PO TABS: 1.0000 | ORAL_TABLET | Freq: Two times a day (BID) | ORAL | 0 refills | Status: DC

## 2023-05-06 MED ORDER — OXYCODONE-ACETAMINOPHEN 5-325 MG PO TABS: 1.0000 | ORAL_TABLET | ORAL | 0 refills | Status: DC | PRN

## 2023-05-06 NOTE — Progress Notes (Addendum)
  Progress Note    05/06/2023 7:46 AM 2 Days Post-Op  Subjective:  doing okay, feels like her right foot is heavy due to swelling    Vitals:   05/05/23 2320 05/06/23 0319  BP: (!) 104/53 (!) 133/44  Pulse: (!) 50 (!) 57  Resp: 16 16  Temp: 98.4 F (36.9 C) 98.4 F (36.9 C)  SpO2: 98% 99%    Physical Exam: General:  no acute distress Cardiac:  regular Lungs:  nonlabored Incisions:  bilateral groin incisions with prevena vac with good seal Extremities:  RLE improved motor and sensation, palpable DP pulse. Continued discoloration of the toes. Some pedal edema on the right   CBC    Component Value Date/Time   WBC 5.7 05/05/2023 0139   RBC 3.04 (L) 05/05/2023 0139   HGB 8.2 (L) 05/05/2023 0139   HGB 13.8 07/07/2022 1531   HCT 25.8 (L) 05/05/2023 0139   HCT 41.8 07/07/2022 1531   PLT 171 05/05/2023 0139   PLT 192 07/07/2022 1531   MCV 84.9 05/05/2023 0139   MCV 86 07/07/2022 1531   MCH 27.0 05/05/2023 0139   MCHC 31.8 05/05/2023 0139   RDW 14.7 05/05/2023 0139   RDW 14.2 07/07/2022 1531   LYMPHSABS 1.1 02/06/2023 0126   MONOABS 0.5 02/06/2023 0126   EOSABS 0.0 02/06/2023 0126   BASOSABS 0.0 02/06/2023 0126    BMET    Component Value Date/Time   NA 134 (L) 05/05/2023 0139   NA 142 07/07/2022 1531   K 3.9 05/05/2023 0139   CL 102 05/05/2023 0139   CO2 24 05/05/2023 0139   GLUCOSE 203 (H) 05/05/2023 0139   BUN 16 05/05/2023 0139   BUN 8 07/07/2022 1531   CREATININE 1.07 (H) 05/05/2023 0139   CALCIUM 8.1 (L) 05/05/2023 0139   GFRNONAA 59 (L) 05/05/2023 0139   GFRAA 53 (L) 06/09/2020 0331    INR    Component Value Date/Time   INR 0.93 07/10/2017 1106     Intake/Output Summary (Last 24 hours) at 05/06/2023 0746 Last data filed at 05/06/2023 0141 Gross per 24 hour  Intake 7.43 ml  Output 300 ml  Net -292.57 ml      Assessment/Plan:  62 y.o. female is 2 days post op, s/p: Left to right femoral to femoral bypass graft with 8mm Dacron and placement  of prevena incisional vacs bilaterally     -She is increasing in mobilization but not quite strong enough to go home yet. She is walking with a RW -RLE well perfused with a palpable DP. Right calf is soft. Continued discoloration of the toes on the right -Bilateral groin incisions with intact prevena -Tolerating her Brilinta without any bleeding issues from the groins -Possibly ready for discharge tomorrow   Loel Dubonnet, [place] [facility] [phone] 05/06/2023 7:46 AM   Addendum: The patient would like to go home today. Her DME orders have been placed. Dr.Brabham is in agreement with discharge today. I will place her discharge orders with follow up with our office in 2-3 weeks for incision check.

## 2023-05-06 NOTE — Progress Notes (Signed)
PT Cancellation Note  Patient Details Name: Alice Bennett MRN: [id number] DOB: 07/21/1961   Cancelled Treatment:    Reason Eval/Treat Not Completed: (P) Patient declined, no reason specified (Pt preparing for discharge and declines mobility, stating she does not have any concerns about mobility at home.)   Johny Shock 05/06/2023, 3:25 PM

## 2023-05-06 NOTE — Plan of Care (Signed)
  Problem: Health Behavior/Discharge Planning: Goal: Ability to manage health-related needs will improve Outcome: Progressing   

## 2023-05-06 NOTE — TOC Transition Note (Signed)
Transition of Care (TOC) - CM/SW Discharge Note Donn Pierini RN, BSN Transitions of Care Unit 4E- RN Case Manager See Treatment Team for direct phone #   Patient Details  Name: Alice Bennett MRN: [id number] Date of Birth: May 07, 1961  Transition of Care [facility]) CM/SW Contact:  Darrold Span, RN Phone Number: 05/06/2023, 4:37 PM   Clinical Narrative:    Pt stable for transition home today, DME orders have been placed.  CM spoke with pt and daughter at bedside- confirmed DME needs- per pt she does not feel she needs BSC at this time- only wants RW and tub bench.   Daughter to transport home,  confirmed address in epic is daughter's home- where pt is staying. Pt going home w/ prevena VAC  Referral for DME called in to [employer]- RW to be delivered to room- tub bench will be shipped- pt to get in a few days.   No further TOC needs noted.    Final next level of care: Home/Self Care Barriers to Discharge: Barriers Resolved   Patient Goals and CMS Choice  Return home    Discharge Placement               Home          Discharge Plan and Services Additional resources added to the After Visit Summary for     Discharge Planning Services: CM Consult Post Acute Care Choice: Durable Medical Equipment          DME Arranged: Bedside commode, Tub bench, Walker rolling DME Agency: [employer] Date DME Agency Contacted: 05/06/23 Time DME Agency Contacted: 1530 Representative spoke with at DME Agency: Beckie Salts            Social Determinants of Health (SDOH) Interventions SDOH Screenings   Food Insecurity: No Food Insecurity (04/29/2023)  Housing: Low Risk  (04/29/2023)  Transportation Needs: No Transportation Needs (04/29/2023)  Utilities: Not At Risk (04/29/2023)  Tobacco Use: High Risk (05/05/2023)     Readmission Risk Interventions    05/06/2023    4:37 PM  Readmission Risk Prevention Plan  Transportation Screening Complete  PCP or Specialist Appt within 5-7  Days Complete  Home Care Screening Complete  Medication Review (RN CM) Complete

## 2023-05-10 ENCOUNTER — Telehealth: Payer: Self-pay | Admitting: Surgery

## 2023-05-10 ENCOUNTER — Other Ambulatory Visit: Payer: Self-pay | Admitting: *Deleted

## 2023-05-10 DIAGNOSIS — I70221 Atherosclerosis of native arteries of extremities with rest pain, right leg: Secondary | ICD-10-CM

## 2023-05-10 DIAGNOSIS — I739 Peripheral vascular disease, unspecified: Secondary | ICD-10-CM

## 2023-05-10 DIAGNOSIS — I70213 Atherosclerosis of native arteries of extremities with intermittent claudication, bilateral legs: Secondary | ICD-10-CM

## 2023-05-10 NOTE — Telephone Encounter (Signed)
-----   Message from Nada Libman, MD sent at 05/04/2023 10:29 AM EDT ----- 05/04/2023:  Surgeon:  Durene Cal Assistants:  Doreatha Massed, PA Procedure:   #1: Explant of right sided Barostim device   #2: Redo exposure, right carotid artery   Ff/u PA clinic on a Monday in 2 weeks for a wound check

## 2023-05-10 NOTE — Discharge Summary (Signed)
Bypass Discharge Summary Patient ID: Alice Bennett [id number] 62 y.o. [birth year]  Admit date: 04/29/2023  Discharge date and time: 05/06/2023  6:00 PM   Admitting Physician: Nada Libman, MD   Discharge Physician: Nada Libman, MD   Admission Diagnoses: Critical lower limb ischemia [facility]) [I70.229]  Discharge Diagnoses: Critical lower limb ischemia [facility]) [I70.229]  Admission Condition: fair  Discharged Condition: good  Indication for Admission: This is a 62 year old female who has previously undergone angiogram of right leg. This is occluded on 2 separate occasions. She has been having rest pain and skin discoloration of the toes for about a week. She was admitted to the hospital for conversion of her anticoagulation. She comes in today for a left right femoral-femoral bypass graft. I contemplated an aortobifemoral graft as a think this would be more durable, however with her comorbidities that she would do well.   Hospital Course: The patient was admitted to the hospital on 05/04/2023 and underwent the following procedures: 1) right femoral endarterectomy, 2) left to right femorofemoral graft.  She tolerated the procedure well and was transferred to the PACU in stable condition.  POD 1 she was doing well.  She was having improved sensation in her right foot.  Her right calf and anterior muscle compartments were soft.  Prevena vacs on bilateral groins were intact.  POD 2 she was increasing in mobilization.  She was tolerating her Brilinta and aspirin.  She had some continued discoloration of her toes on the right, but her right lower extremity was well-perfused.  She was mobilizing well without the need for HHPT.  She was tolerating a normal diet after surgery.  She was discharged home on POD 2.  Consults: None  Treatments: IV hydration, antibiotics: Ancef, analgesia: percocet, cardiac meds: carvedilol, anticoagulation: ASA and Brilinta, therapies: PT, OT, and RN, and surgery:  1) right femoral endarterectomy, 2) left to right femorofemoral graft    Disposition: Discharge disposition: 01-Home or Self Care       - [facility] Registry use ---  Post-op:  Wound infection: No  Graft infection: No  Transfusion: No  If yes,  units given New Arrhythmia: No Patency judged by: [ ]  Dopper only, [x ] Palpable graft pulse, [ x] Palpable distal pulse, [ ]  ABI inc. > 0.15, [ ]  Duplex D/C Ambulatory Status: Ambulatory  Complications: MI: [ x] No, [ ]  Troponin only, [ ]  EKG or Clinical CHF: No Resp failure: [ x] none, [ ]  Pneumonia, [ ]  Ventilator Chg in renal function: [x ] none, [ ]  Inc. Cr > 0.5, [ ]  Temp. Dialysis, [ ]  Permanent dialysis Stroke: [ ]  None, [ ]  Minor, [ ]  Major Return to OR: No  Reason for return to OR: [ ]  Bleeding, [ ]  Infection, [ ]  Thrombosis, [ ]  Revision  Discharge medications: Statin use:  Yes ASA use:  Yes Plavix use:  No  for medical reason On Brilinta Beta blocker use: Yes Coumadin use: No  for medical reason on Brilinta    Patient Instructions:  Allergies as of 05/06/2023       Reactions   Codeine Itching        Medication List     STOP taking these medications    cephALEXin 500 MG capsule Commonly known as: KEFLEX   clopidogrel 75 MG tablet Commonly known as: PLAVIX   rivaroxaban 20 MG Tabs tablet Commonly known as: XARELTO       TAKE these medications    aspirin  EC 81 MG tablet Take 81 mg by mouth daily.   atorvastatin 80 MG tablet Commonly known as: LIPITOR Take 1 tablet (80 mg total) by mouth daily at 6 PM.   carvedilol 6.25 MG tablet Commonly known as: COREG Take 1 tablet (6.25 mg total) by mouth 2 (two) times daily with a meal.   clonazePAM 1 MG tablet Commonly known as: KLONOPIN Take 1 mg by mouth 2 (two) times daily.   D3 High Potency 125 MCG (5000 UT) capsule Generic drug: Cholecalciferol Take 5,000 Units by mouth daily.   ezetimibe 10 MG tablet Commonly known as: ZETIA Take 1 tablet  (10 mg total) by mouth daily.   Farxiga 10 MG Tabs tablet Generic drug: dapagliflozin propanediol Take 10 mg by mouth daily.   nitroGLYCERIN 0.4 MG SL tablet Commonly known as: NITROSTAT Place 0.4 mg under the tongue as needed for chest pain.   olmesartan 20 MG tablet Commonly known as: BENICAR Take 1 tablet (20 mg total) by mouth daily.   oxyCODONE-acetaminophen 5-325 MG tablet Commonly known as: PERCOCET/ROXICET Take 1-2 tablets by mouth every 4 (four) hours as needed for moderate pain.   Ozempic (2 MG/DOSE) 8 MG/3ML Sopn Generic drug: Semaglutide (2 MG/DOSE) Inject 2 mg into the skin once a week.   pantoprazole 40 MG tablet Commonly known as: PROTONIX Take 1 tablet (40 mg total) by mouth daily.   pregabalin 150 MG capsule Commonly known as: LYRICA Take 150 mg by mouth 2 (two) times daily.   sertraline 100 MG tablet Commonly known as: ZOLOFT Take 100 mg by mouth daily.   spironolactone 25 MG tablet Commonly known as: ALDACTONE Take 25 mg by mouth daily.   sulfamethoxazole-trimethoprim 800-160 MG tablet Commonly known as: BACTRIM DS Take 1 tablet by mouth every 12 (twelve) hours.   ticagrelor 90 MG Tabs tablet Commonly known as: BRILINTA Take 1 tablet (90 mg total) by mouth 2 (two) times daily.   Vascepa 1 g capsule Generic drug: icosapent Ethyl Take 1 capsule (1 g total) by mouth 2 (two) times daily.               Discharge Care Instructions  (From admission, onward)           Start     Ordered   05/06/23 0000  Discharge wound care:       Comments: Remove your prevena vacs about 7-10 days after your surgery. After the vacs are removed, clean your incisions daily with soap and water. You may shower. Do not soak in a bath. Keep your incisions clean after washing   05/06/23 1459           Activity: activity as tolerated, no driving while on analgesics, and no heavy lifting for 4 weeks Diet: low fat, low cholesterol diet Wound Care: keep  wound clean and dry  Follow-up with [facility] in 2 weeks.  SignedLoel Dubonnet, PA-C 05/10/2023 10:06 AM

## 2023-05-10 NOTE — Telephone Encounter (Signed)
-----   Message from [facility], [place] sent at 05/06/2023  3:00 PM EDT ----- S/p Left to right fem-fem with R fem endart, 6/26   Please arrange follow up with PA in 2-3 wks for incision check on Brabham office day

## 2023-05-17 ENCOUNTER — Telehealth: Payer: Self-pay

## 2023-05-17 ENCOUNTER — Other Ambulatory Visit: Payer: Self-pay | Admitting: *Deleted

## 2023-05-17 DIAGNOSIS — I70213 Atherosclerosis of native arteries of extremities with intermittent claudication, bilateral legs: Secondary | ICD-10-CM

## 2023-05-17 DIAGNOSIS — I70221 Atherosclerosis of native arteries of extremities with rest pain, right leg: Secondary | ICD-10-CM

## 2023-05-17 DIAGNOSIS — I739 Peripheral vascular disease, unspecified: Secondary | ICD-10-CM

## 2023-05-17 NOTE — Progress Notes (Signed)
US orders placed. 

## 2023-05-17 NOTE — Telephone Encounter (Signed)
Caller: Patient  Concern: blisters on bottom of toes (big toe, 4th toe, and pinky toe), numb feeling that sometimes affects sleep  Location: L foot  Description:  some discoloration before surgery and pt noticed a couple of new blisteres approx 1 wk after surgery  Treatments:  Pt encouraged to monitor, wash with mild soap, and keep dry  Procedure: Interventional Vascular Procedure  Consulted: Maureen, PA  Resolution: Instructed to call back if symptoms perist and keep appt  Next Appt: 7/15 post-op appt already sch

## 2023-05-23 ENCOUNTER — Ambulatory Visit (INDEPENDENT_AMBULATORY_CARE_PROVIDER_SITE_OTHER): Payer: Medicaid Other | Admitting: Physician Assistant

## 2023-05-23 VITALS — BP 128/76 | HR 87 | Temp 98.1°F | Wt 207.0 lb

## 2023-05-23 DIAGNOSIS — I739 Peripheral vascular disease, unspecified: Secondary | ICD-10-CM

## 2023-05-23 DIAGNOSIS — I70221 Atherosclerosis of native arteries of extremities with rest pain, right leg: Secondary | ICD-10-CM

## 2023-05-23 NOTE — Progress Notes (Signed)
POST OPERATIVE OFFICE NOTE    CC:  F/u for surgery  HPI:  This is a 62 y.o. female who is s/p left to right femoral- femoral bypass graft, right femoral endarterectomy, placement of Kerecis skin substitute with Bilateral wound VACs on 05/04/23 by Dr. Myra Gianotti. This was performed due to critical limb ischemia of the right lower extremity.  She did well post operatively and was discharged home POD#2.   Pt returns today for follow up with her daughter.  Pt states overall she is doing okay. Her groins are doing well. They are still a little sore. The Prevena VACs came off after 6 days. Her right foot after she got home developed some blood blisters. These are getting better. She is still having some tingling and sharp pains in her right foot. She says this is about the same from prior to her surgery. She is walking some. She says she still feels a little unsteady on her feet.  She is compliant with her Brillinta, Aspirin and lipitor. She says she has not been smoking.   Allergies  Allergen Reactions   Codeine Itching    Current Outpatient Medications  Medication Sig Dispense Refill   aspirin EC 81 MG tablet Take 81 mg by mouth daily.     atorvastatin (LIPITOR) 80 MG tablet Take 1 tablet (80 mg total) by mouth daily at 6 PM. 30 tablet 0   carvedilol (COREG) 6.25 MG tablet Take 1 tablet (6.25 mg total) by mouth 2 (two) times daily with a meal. 60 tablet 0   Cholecalciferol (D3 HIGH POTENCY) 125 MCG (5000 UT) capsule Take 5,000 Units by mouth daily.     clonazePAM (KLONOPIN) 1 MG tablet Take 1 mg by mouth 2 (two) times daily.     ezetimibe (ZETIA) 10 MG tablet Take 1 tablet (10 mg total) by mouth daily. 90 tablet 3   FARXIGA 10 MG TABS tablet Take 10 mg by mouth daily.      olmesartan (BENICAR) 20 MG tablet Take 1 tablet (20 mg total) by mouth daily. 90 tablet 3   OZEMPIC, 2 MG/DOSE, 8 MG/3ML SOPN Inject 2 mg into the skin once a week.     pantoprazole (PROTONIX) 40 MG tablet Take 1 tablet (40 mg  total) by mouth daily. 30 tablet 0   pregabalin (LYRICA) 150 MG capsule Take 150 mg by mouth 2 (two) times daily.     sertraline (ZOLOFT) 100 MG tablet Take 100 mg by mouth daily.     spironolactone (ALDACTONE) 25 MG tablet Take 25 mg by mouth daily.     sulfamethoxazole-trimethoprim (BACTRIM DS) 800-160 MG tablet Take 1 tablet by mouth every 12 (twelve) hours. 10 tablet 0   ticagrelor (BRILINTA) 90 MG TABS tablet Take 1 tablet (90 mg total) by mouth 2 (two) times daily. 60 tablet 2   VASCEPA 1 g capsule Take 1 capsule (1 g total) by mouth 2 (two) times daily. 180 capsule 3   No current facility-administered medications for this visit.     ROS:  See HPI  Physical Exam:  Vitals:   05/23/23 1245  BP: 128/76  Pulse: 87  Temp: 98.1 F (36.7 C)  SpO2: 90%     Incision:  B groin incisions are well healed Extremities:  Bilateral lower extremities well perfused and warm. Doppler signal in fem - fem bypass. Doppler right Dp/PT/ Pero, left PT/Pero. She does have some dried blood filled blisters on her toes. No signs of infection. Neuro: alert and  oriented Abdomen:  soft    Assessment/Plan:  This is a 62 y.o. female who is s/p:left to right femoral- femoral bypass graft, right femoral endarterectomy, placement of Kerecis skin substitute with Bilateral wound VACs on 05/04/23 by Dr. Myra Gianotti. This was performed due to critical limb ischemia of the right lower extremity.  Her BLE are well perfused with brisk Doppler signals bilaterally. Her incisions have healed very well. She does appear to have showered some small emboli to her toes. These are healing though without any signs of infection. - She will continue Aspirin, statin and Brillinta - Encourage her to mobilize as much as able - encourage continued smoking cessation  - Discussed importance of keeping her feet protected -  She will follow up in 4-6 weeks with fem-fem bypass graft duplex and ABIs -She knows to call earlier if she has any  new or concerning symptoms    Doreatha Massed, [facility] [phone]   Clinic MD:  Myra Gianotti

## 2023-05-27 ENCOUNTER — Other Ambulatory Visit: Payer: Self-pay | Admitting: Physician Assistant

## 2023-06-01 ENCOUNTER — Other Ambulatory Visit: Payer: Self-pay

## 2023-06-01 DIAGNOSIS — I739 Peripheral vascular disease, unspecified: Secondary | ICD-10-CM

## 2023-06-06 ENCOUNTER — Encounter (HOSPITAL_COMMUNITY): Payer: Medicaid Other

## 2023-06-07 ENCOUNTER — Encounter: Payer: Medicaid Other | Admitting: Vascular Surgery

## 2023-06-21 ENCOUNTER — Other Ambulatory Visit: Payer: Self-pay

## 2023-06-21 ENCOUNTER — Telehealth: Payer: Self-pay

## 2023-06-21 ENCOUNTER — Emergency Department (HOSPITAL_BASED_OUTPATIENT_CLINIC_OR_DEPARTMENT_OTHER): Payer: Medicaid Other

## 2023-06-21 ENCOUNTER — Emergency Department (HOSPITAL_BASED_OUTPATIENT_CLINIC_OR_DEPARTMENT_OTHER)
Admission: EM | Admit: 2023-06-21 | Discharge: 2023-06-21 | Disposition: A | Discharge status: Home / Self Care | Payer: Medicaid Other | Attending: Emergency Medicine | Admitting: Emergency Medicine

## 2023-06-21 ENCOUNTER — Encounter (HOSPITAL_BASED_OUTPATIENT_CLINIC_OR_DEPARTMENT_OTHER): Payer: Self-pay | Admitting: Pediatrics

## 2023-06-21 DIAGNOSIS — Z79899 Other long term (current) drug therapy: Secondary | ICD-10-CM | POA: Insufficient documentation

## 2023-06-21 DIAGNOSIS — L97511 Non-pressure chronic ulcer of other part of right foot limited to breakdown of skin: Secondary | ICD-10-CM | POA: Diagnosis present

## 2023-06-21 DIAGNOSIS — I10 Essential (primary) hypertension: Secondary | ICD-10-CM | POA: Insufficient documentation

## 2023-06-21 DIAGNOSIS — E11621 Type 2 diabetes mellitus with foot ulcer: Secondary | ICD-10-CM | POA: Diagnosis not present

## 2023-06-21 DIAGNOSIS — Z7982 Long term (current) use of aspirin: Secondary | ICD-10-CM | POA: Insufficient documentation

## 2023-06-21 LAB — BASIC METABOLIC PANEL
Anion gap: 7 (ref 5–15)
BUN: 11 mg/dL (ref 8–23)
CO2: 26 mmol/L (ref 22–32)
Calcium: 8.7 mg/dL — ABNORMAL LOW (ref 8.9–10.3)
Chloride: 106 mmol/L (ref 98–111)
Creatinine, Ser: 0.92 mg/dL (ref 0.44–1.00)
GFR, Estimated: >60 mL/min (ref >60)
Glucose, Bld: 190 mg/dL — ABNORMAL HIGH (ref 70–99)
Potassium: 3.9 mmol/L (ref 3.5–5.1)
Sodium: 139 mmol/L (ref 135–145)

## 2023-06-21 LAB — CBC
HCT: 38.4 % (ref 36.0–46.0)
Hemoglobin: 12 g/dL (ref 12.0–15.0)
MCH: 26.4 pg (ref 26.0–34.0)
MCHC: 31.3 g/dL (ref 30.0–36.0)
MCV: 84.4 fL (ref 80.0–100.0)
Platelets: 221 10*3/uL (ref 150–400)
RBC: 4.55 MIL/uL (ref 3.87–5.11)
RDW: 16.9 % — ABNORMAL HIGH (ref 11.5–15.5)
WBC: 6.7 10*3/uL (ref 4.0–10.5)
nRBC: 0 % (ref 0.0–0.2)

## 2023-06-21 LAB — CBG MONITORING, ED: Glucose-Capillary: 183 mg/dL — ABNORMAL HIGH (ref 70–99)

## 2023-06-21 MED ORDER — IRBESARTAN 150 MG PO TABS
150.0000 mg | ORAL_TABLET | Freq: Every day | ORAL | Status: DC
  Administered 2023-06-21: 150 mg via ORAL
  Filled 2023-06-21: qty 1

## 2023-06-21 MED ORDER — CARVEDILOL 6.25 MG PO TABS
6.2500 mg | ORAL_TABLET | Freq: Once | ORAL | Status: AC
  Administered 2023-06-21: 6.25 mg via ORAL
  Filled 2023-06-21: qty 1

## 2023-06-21 MED ORDER — TICAGRELOR 90 MG PO TABS
90.0000 mg | ORAL_TABLET | Freq: Once | ORAL | Status: AC
  Administered 2023-06-21: 90 mg via ORAL
  Filled 2023-06-21: qty 1

## 2023-06-21 NOTE — ED Notes (Addendum)
C/o R 4-5th toe pain. Denies fever, NVD, sob, CP, HA, dizziness or other sx. Dry wound R 4th toe.

## 2023-06-21 NOTE — Discharge Instructions (Signed)
Continue keeping toes covered, and apply Betadine to the affected site.  Please follow-up with your vascular doctor next week as planned.  If you notice signs of worsening pain, pus draining from the affected site, you develop fever please return for further evaluation.

## 2023-06-21 NOTE — ED Notes (Signed)
EDPA into room, at BS.  

## 2023-06-21 NOTE — ED Triage Notes (Signed)
C/O high blood pressure;  C/o infections on right toes, endorsed hx of DM. C/O poor circulation on the affected lower extremity and saw vascular sx last month and has a f/u on Monday.

## 2023-06-21 NOTE — Telephone Encounter (Signed)
Caller: Patient  Concern: Swollen toes, specifically the 4th toe, yellow pus drainage when scab falls off, foot swollen to ankle after ambulating on it during the day, constant pain, burning, numbness, reddened toes  Pt denies rest pain, fever, chills, N/V  Location: right leg  Description:  pain consistent since surgery, no worsening, scab and subsequent pus are intermittent  Aggravating Factors: walking  Quality: burning and tight (pulling)  Treatments:  Pt keeping area on toe washed well with antibacterial soap and using Bacterine  Procedure: Vascular Bypass  Consulted: Marisue Humble, PA  Resolution: Instructed to call back if symptoms perist or worsen, pt to call on Wed, 8/14, to give update  Next Appt: Keep 8/19 appts

## 2023-06-21 NOTE — ED Provider Notes (Signed)
[place] [facility] AT [facility] Provider Note   CSN: [id number] Arrival date & time: 06/21/23  1215     History  Chief Complaint  Patient presents with   Hypertension   Wound Infection    Alice Bennett is a 62 y.o. female with past medical history of peripheral arterial disease, previous critical limb ischemia of the right lower extremity requiring bypass graft bilaterally in June of this year who presents with concern for ulcer on right fourth and fifth toes.  Patient reports that when she changes the bandage she she has some yellowish drainage.  Patient reports that she is applying bacitracin to the affected site.  Appointment to follow-up with her vascular physician in 6 days.  She denies any systemic fever, chills.  Patient arrives with elevated blood pressure, reports that she went to urgent care yesterday to have the foot evaluated and because of that they did with her daughter, reports she has not taken her blood thinner for blood pressure medications.   Hypertension       Home Medications Prior to Admission medications   Medication Sig Start Date End Date Taking? Authorizing Provider  aspirin EC 81 MG tablet Take 81 mg by mouth daily.    [provider]  atorvastatin (LIPITOR) 80 MG tablet Take 1 tablet (80 mg total) by mouth daily at 6 PM. 07/19/17   Lennette Bihari, MD  carvedilol (COREG) 6.25 MG tablet Take 1 tablet (6.25 mg total) by mouth 2 (two) times daily with a meal. 07/19/17   Lennette Bihari, MD  Cholecalciferol (D3 HIGH POTENCY) 125 MCG (5000 UT) capsule Take 5,000 Units by mouth daily.    [provider]  clonazePAM (KLONOPIN) 1 MG tablet Take 1 mg by mouth 2 (two) times daily.    [provider]  ezetimibe (ZETIA) 10 MG tablet Take 1 tablet (10 mg total) by mouth daily. 06/22/22   Lennette Bihari, MD  FARXIGA 10 MG TABS tablet Take 10 mg by mouth daily.  03/28/19   [provider]  olmesartan  (BENICAR) 20 MG tablet Take 1 tablet (20 mg total) by mouth daily. 03/09/21   Lennette Bihari, MD  OZEMPIC, 2 MG/DOSE, 8 MG/3ML SOPN Inject 2 mg into the skin once a week. 02/01/23   [provider]  pantoprazole (PROTONIX) 40 MG tablet Take 1 tablet (40 mg total) by mouth daily. 07/19/17   Lennette Bihari, MD  pregabalin (LYRICA) 150 MG capsule Take 150 mg by mouth 2 (two) times daily.    [provider]  sertraline (ZOLOFT) 100 MG tablet Take 100 mg by mouth daily.    [provider]  spironolactone (ALDACTONE) 25 MG tablet Take 25 mg by mouth daily.    [provider]  sulfamethoxazole-trimethoprim (BACTRIM DS) 800-160 MG tablet Take 1 tablet by mouth every 12 (twelve) hours. 05/06/23   Schuh, McKenzi P, PA-C  ticagrelor (BRILINTA) 90 MG TABS tablet Take 1 tablet (90 mg total) by mouth 2 (two) times daily. 02/06/23   Alan Mulder, MD  VASCEPA 1 g capsule Take 1 capsule (1 g total) by mouth 2 (two) times daily. 10/21/22   Lennette Bihari, MD      Allergies    Codeine    Review of Systems   Review of Systems  All other systems reviewed and are negative.   Physical Exam Updated Vital Signs BP (!) 133/59   Pulse (!) 57   Temp 97.7  F (36.5 C)   Resp 13   Ht 5\' 9"  (1.753 m)   Wt 95.3 kg   SpO2 97%   BMI 31.01 kg/m  Physical Exam Vitals and nursing note reviewed.  Constitutional:      General: She is not in acute distress.    Appearance: Normal appearance.  HENT:     Head: Normocephalic and atraumatic.  Eyes:     General:        Right eye: No discharge.        Left eye: No discharge.  Cardiovascular:     Rate and Rhythm: Normal rate and regular rhythm.     Pulses: Normal pulses.     Comments: DP, PT pulses are intact, 2+ on the affected right lower extremity. Pulmonary:     Effort: Pulmonary effort is normal. No respiratory distress.  Musculoskeletal:        General: No deformity.  Skin:    General: Skin is warm and dry.     Comments:  Some appropriately healing chronic ulcers noted on right 4th doe dorsum as well as 5th toe on underside. Granulation tissue without pus or soft tissue swelling noted on my exam.  Neurological:     Mental Status: She is alert and oriented to person, place, and time.  Psychiatric:        Mood and Affect: Mood normal.        Behavior: Behavior normal.     ED Results / Procedures / Treatments   Labs (all labs ordered are listed, but only abnormal results are displayed) Labs Reviewed  CBC - Abnormal; Notable for the following components:      Result Value   RDW 16.9 (*)    All other components within normal limits  BASIC METABOLIC PANEL - Abnormal; Notable for the following components:   Glucose, Bld 190 (*)    Calcium 8.7 (*)    All other components within normal limits  CBG MONITORING, ED - Abnormal; Notable for the following components:   Glucose-Capillary 183 (*)    All other components within normal limits    EKG None  Radiology DG Foot Complete Right  Result Date: 06/21/2023 CLINICAL DATA:  Toe swelling. EXAM: RIGHT FOOT COMPLETE - 3 VIEW COMPARISON:  Toe x-ray 06/20/2023 fourth digit FINDINGS: Small plantar and Achilles calcaneal spurs. Osteopenia. No fracture or dislocation. Preserved joint spaces. No definite erosive changes identified. If there is further concern of bone infection, MRI or bone scan could be considered as clinically appropriate for further sensitivity. IMPRESSION: Osteopenia.  No acute osseous abnormality Electronically Signed   By: Karen Kays M.D.   On: 06/21/2023 14:23    Procedures Procedures    Medications Ordered in ED Medications  irbesartan (AVAPRO) tablet 150 mg (150 mg Oral Given 06/21/23 1254)  ticagrelor (BRILINTA) tablet 90 mg (90 mg Oral Given 06/21/23 1254)  carvedilol (COREG) tablet 6.25 mg (6.25 mg Oral Given 06/21/23 1254)    ED Course/ Medical Decision Making/ A&P                                 Medical Decision Making Amount  and/or Complexity of Data Reviewed Labs: ordered. Radiology: ordered.  Risk Prescription drug management.   This patient is a 62 y.o. female  who presents to the ED for concern of toe ulcer, possible infection.   Differential diagnoses prior to evaluation: The emergent differential diagnosis includes, but  is not limited to,  diabetic / vascular foot ulcer, vs cellulitis, vs osteomyelitis vs other . This is not an exhaustive differential.   Past Medical History / Co-morbidities / Social History: Diabetes, PVD, PAD, previous femoral bypass  Additional history: Chart reviewed. Pertinent results include: Sensibly reviewed recent femoral bypass surgical note and admission.  As well as outpatient notes with her vascular physician.  Physical Exam: Physical exam performed. The pertinent findings include: Some appropriately healing chronic ulcers noted on right 4th doe dorsum as well as 5th toe on underside. Granulation tissue without pus or soft tissue swelling noted on my exam.   DP, PT pulses are intact, 2+ on the affected right lower extremity.  Lab Tests/Imaging studies: I personally interpreted labs/imaging and the pertinent results include: BMP notable for elevated glucose, 190, CBC unremarkable, notable no leukocytosis. I independently interpreted plain film of right foot which shows osteopenia, but no evidence of osteomyelitis or infection. I agree with the radiologist interpretation.  Cardiac monitoring: EKG obtained and interpreted by myself and attending physician which shows: NSR   Medications: I ordered medication including ordered patient's home blood pressure medication as well as her blood thinner since she missed her doses yesterday.  I have reviewed the patients home medicines and have made adjustments as needed.   Disposition: After consideration of the diagnostic results and the patients response to treatment, I feel that patient would not developing osteomyelitis or  infection on my exam in her toes with Betadine, keeping them covered and to follow-up with vascular surgery on the 19th that she had planned.   emergency department workup does not suggest an emergent condition requiring admission or immediate intervention beyond what has been performed at this time. The plan is: as above. The patient is safe for discharge and has been instructed to return immediately for worsening symptoms, change in symptoms or any other concerns.  Final Clinical Impression(s) / ED Diagnoses Final diagnoses:  Skin ulcer of right foot including toes, limited to breakdown of skin [facility])    Rx / DC Orders ED Discharge Orders     None         [place] 06/21/23 1453    Benjiman Core, MD 06/22/23 [phone]

## 2023-06-21 NOTE — ED Notes (Addendum)
Patient's wound(s) was cleaned with Betadine and then covered in sterile, non-stick gauze. Patient sent home with a few items for further care. Instructions given to the patient for wound care at home. Patient's affected foot also covered with a patient belongings bag to ensure it stays dry while she transits home.

## 2023-06-27 ENCOUNTER — Ambulatory Visit (INDEPENDENT_AMBULATORY_CARE_PROVIDER_SITE_OTHER): Payer: Medicaid Other | Admitting: Physician Assistant

## 2023-06-27 ENCOUNTER — Ambulatory Visit (INDEPENDENT_AMBULATORY_CARE_PROVIDER_SITE_OTHER)
Admission: RE | Admit: 2023-06-27 | Discharge: 2023-06-27 | Disposition: A | Discharge status: Home / Self Care | Payer: Medicaid Other | Source: Ambulatory Visit | Attending: Surgery | Admitting: Surgery

## 2023-06-27 ENCOUNTER — Ambulatory Visit (HOSPITAL_COMMUNITY)
Admission: RE | Admit: 2023-06-27 | Discharge: 2023-06-27 | Disposition: A | Discharge status: Home / Self Care | Payer: Medicaid Other | Source: Ambulatory Visit | Attending: Surgery | Admitting: Surgery

## 2023-06-27 VITALS — BP 167/83 | HR 73 | Temp 98.1°F | Resp 18 | Ht 69.0 in | Wt 210.0 lb

## 2023-06-27 DIAGNOSIS — I739 Peripheral vascular disease, unspecified: Secondary | ICD-10-CM | POA: Diagnosis present

## 2023-06-27 DIAGNOSIS — I70221 Atherosclerosis of native arteries of extremities with rest pain, right leg: Secondary | ICD-10-CM

## 2023-06-27 LAB — VAS US ABI WITH/WO TBI
Left ABI: 1.03
Right ABI: 0.86

## 2023-06-27 MED ORDER — OXYCODONE-ACETAMINOPHEN 5-325 MG PO TABS
1.0000 | ORAL_TABLET | Freq: Four times a day (QID) | ORAL | 0 refills | Status: DC | PRN
Start: 2023-06-27 — End: 2023-10-27

## 2023-06-27 NOTE — Progress Notes (Signed)
POST OPERATIVE OFFICE NOTE    CC:  F/u for surgery  HPI:  This is a 62 y.o. female who is s/p left the right femoral to femoral bypass with right femoral endarterectomy by Dr. Myra Gianotti on 05/04/2023.  This was performed due to an ischemic right leg with tissue loss due to an occluded iliac stents.  She initially underwent right common iliac artery stenting by Dr. Gery Pray in September 2023.  She then required mechanical thrombectomy and restenting of the right common iliac artery by Dr. Myra Gianotti on 02/05/2023.  Unfortunately right iliac stent thrombosed again and she underwent right common and external iliac artery angioplasty and stenting by Dr. Lenell Antu on 04/22/2023.  Patient states rest pain has resolved in bilateral lower extremities.  She has a right fourth toe ulceration that is slow to heal.  She states groin incisions have completely healed.  She continues to take her Brilinta and aspirin as well as statin daily.  She denies tobacco use.  Unfortunately, R foot plain film was negative for osteomyelitis of R 4th toe.  Allergies  Allergen Reactions   Codeine Itching    Current Outpatient Medications  Medication Sig Dispense Refill   aspirin EC 81 MG tablet Take 81 mg by mouth daily.     atorvastatin (LIPITOR) 80 MG tablet Take 1 tablet (80 mg total) by mouth daily at 6 PM. 30 tablet 0   carvedilol (COREG) 6.25 MG tablet Take 1 tablet (6.25 mg total) by mouth 2 (two) times daily with a meal. 60 tablet 0   Cholecalciferol (D3 HIGH POTENCY) 125 MCG (5000 UT) capsule Take 5,000 Units by mouth daily.     clonazePAM (KLONOPIN) 1 MG tablet Take 1 mg by mouth 2 (two) times daily.     ezetimibe (ZETIA) 10 MG tablet Take 1 tablet (10 mg total) by mouth daily. 90 tablet 3   FARXIGA 10 MG TABS tablet Take 10 mg by mouth daily.      olmesartan (BENICAR) 20 MG tablet Take 1 tablet (20 mg total) by mouth daily. 90 tablet 3   oxyCODONE-acetaminophen (PERCOCET/ROXICET) 5-325 MG tablet Take 1 tablet by mouth  every 6 (six) hours as needed for severe pain. 20 tablet 0   OZEMPIC, 2 MG/DOSE, 8 MG/3ML SOPN Inject 2 mg into the skin once a week.     pantoprazole (PROTONIX) 40 MG tablet Take 1 tablet (40 mg total) by mouth daily. 30 tablet 0   pregabalin (LYRICA) 150 MG capsule Take 150 mg by mouth 2 (two) times daily.     sertraline (ZOLOFT) 100 MG tablet Take 100 mg by mouth daily.     spironolactone (ALDACTONE) 25 MG tablet Take 25 mg by mouth daily.     sulfamethoxazole-trimethoprim (BACTRIM DS) 800-160 MG tablet Take 1 tablet by mouth every 12 (twelve) hours. 10 tablet 0   ticagrelor (BRILINTA) 90 MG TABS tablet Take 1 tablet (90 mg total) by mouth 2 (two) times daily. 60 tablet 2   VASCEPA 1 g capsule Take 1 capsule (1 g total) by mouth 2 (two) times daily. 180 capsule 3   No current facility-administered medications for this visit.     ROS:  See HPI  Physical Exam:  Vitals:   06/27/23 1115  BP: (!) 167/83  Pulse: 73  Resp: 18  Temp: 98.1 F (36.7 C)  TempSrc: Temporal  SpO2: 97%  Weight: 210 lb (95.3 kg)  Height: 5\' 9"  (1.753 m)    Incision: Groin incisions healed Extremities: Palpable and  symmetrical 2+ PT pulses Neuro: A&O    Femorofemoral bypass duplex demonstrates a widely patent bypass  ABI/TBIToday's ABIToday's TBIPrevious ABIPrevious TBI  +-------+-----------+-----------+------------+------------+  Right 0.86       0.45       0.32        absent        +-------+-----------+-----------+------------+------------+  Left  1.03       0.69       0.86        0.52            Assessment/Plan:  This is a 62 y.o. female who is s/p: Left to right femoral to femoral bypass after failed endovascular revascularization of right iliac system x 3  -Bilateral lower extremities are well-perfused with symmetrical 2+ palpable PT pulses.  She has completely healed her groin incisions.  She does have a small ulceration on her right fourth toe.  Plain film was negative for  osteomyelitis.  She will continue Betadine paint as well as cleansing with soap and water on a daily basis.  She will continue to walk is much as possible.  She will continue her Brilinta, aspirin, statin daily.  I have refilled pain medication due to pain in right fourth toe at night interrupting her sleep.  She is aware this will be her last narcotic prescription.  She will follow-up in 1 month for a wound check of the right fourth toe.  Based on today's TBI, she should have adequate circulation to heal this toe ulceration with wound care.  She knows to call/return office sooner with any questions or concerns.   Emilie Rutter, PA-C [facility] [phone]  Clinic MD:  Myra Gianotti

## 2023-06-29 ENCOUNTER — Other Ambulatory Visit: Payer: Self-pay | Admitting: Cardiovascular Disease

## 2023-06-30 ENCOUNTER — Other Ambulatory Visit: Payer: Self-pay | Admitting: Cardiovascular Disease

## 2023-07-04 ENCOUNTER — Encounter (HOSPITAL_COMMUNITY): Payer: Medicaid Other

## 2023-07-04 ENCOUNTER — Ambulatory Visit: Payer: Medicaid Other | Admitting: Surgery

## 2023-07-25 ENCOUNTER — Ambulatory Visit: Payer: Medicaid Other

## 2023-08-15 ENCOUNTER — Encounter: Payer: Self-pay | Admitting: *Deleted

## 2023-08-15 ENCOUNTER — Ambulatory Visit (INDEPENDENT_AMBULATORY_CARE_PROVIDER_SITE_OTHER): Payer: Medicaid Other | Admitting: Physician Assistant

## 2023-08-15 VITALS — BP 158/91 | HR 79 | Temp 97.7°F | Resp 18 | Ht 69.0 in | Wt 214.1 lb

## 2023-08-15 DIAGNOSIS — I739 Peripheral vascular disease, unspecified: Secondary | ICD-10-CM

## 2023-08-15 DIAGNOSIS — Z006 Encounter for examination for normal comparison and control in clinical research program: Secondary | ICD-10-CM

## 2023-08-15 DIAGNOSIS — L97511 Non-pressure chronic ulcer of other part of right foot limited to breakdown of skin: Secondary | ICD-10-CM | POA: Diagnosis not present

## 2023-08-15 MED ORDER — SULFAMETHOXAZOLE-TRIMETHOPRIM 800-160 MG PO TABS: 1.0000 | ORAL_TABLET | Freq: Two times a day (BID) | ORAL | 0 refills | Status: AC

## 2023-08-16 ENCOUNTER — Encounter: Payer: Self-pay | Admitting: Physician Assistant

## 2023-08-16 NOTE — Progress Notes (Signed)
Office Note     CC:  follow up Requesting Provider:  Erskine Emery, NP  HPI: Alice Bennett is a 62 y.o. (1961-03-08) female who presents for surveillance of PAD as well as wound check of right foot.  She is status post left to right femoral to femoral bypass with right femoral endarterectomy by Dr. Myra Gianotti on 05/04/2023.  She initially underwent right common iliac artery stenting by Dr. Gery Pray in September 2023.  She then required mechanical thrombectomy and restenting of the right common iliac artery by Dr. Myra Gianotti on 02/05/2023.  Unfortunately the right iliac stent thrombosed again and she underwent right common and external iliac artery stenting by Dr. Lenell Antu on 04/22/2023.  She has been without claudication or rest pain.  She has a slow to heal right fourth toe.  She was seen in the emergency department in August and workup was negative for osteomyelitis.  She is frustrated with how long it is taking to heal the fourth toe.  She continues to take an aspirin Brilinta and statin daily.  She states she stopped smoking after her femorofemoral bypass.   Past Medical History:  Diagnosis Date   CAD (coronary artery disease)    07/10/17 PCI/DES to LAD, EF 35-40%   Diabetes mellitus without complication (HCC)    Hyperlipidemia    Hypertension    NSTEMI (non-ST elevated myocardial infarction) [facility])     Past Surgical History:  Procedure Laterality Date   ABDOMINAL AORTOGRAM W/LOWER EXTREMITY N/A 07/26/2022   Procedure: ABDOMINAL AORTOGRAM W/LOWER EXTREMITY;  Surgeon: Runell Gess, MD;  Location: [facility] INVASIVE CV LAB;  Service: Cardiovascular;  Laterality: N/A;   ABDOMINAL AORTOGRAM W/LOWER EXTREMITY N/A 04/22/2023   Procedure: ABDOMINAL AORTOGRAM W/LOWER EXTREMITY;  Surgeon: Leonie Douglas, MD;  Location: [facility] INVAS[facility] CV LAB;  Service: Cardiovascular;  Laterality: N/A;   AORTOGRAM Bilateral 02/05/2023   Procedure: AORTOGRAM WITH RUN OFF;  Surgeon: Nada Libman, MD;  Location: [facility] OR;  Service:  Vascular;  Laterality: Bilateral;   APPLICATION OF WOUND VAC Bilateral 05/04/2023   Procedure: APPLICATION OF WOUND VAC;  Surgeon: Nada Libman, MD;  Location: [facility] OR;  Service: Vascular;  Laterality: Bilateral;   CORONARY PRESSURE/FFR STUDY N/A 07/10/2017   Procedure: INTRAVASCULAR PRESSURE WIRE/FFR STUDY;  Surgeon: Swaziland, Peter M, MD;  Location: [facility] INV[facility] CV LAB;  Service: Cardiovascular;  Laterality: N/A;   CORONARY PRESSURE/FFR STUDY N/A 05/31/2022   Procedure: INTRAVASCULAR PRESSURE WIRE/FFR STUDY;  Surgeon: Yvonne Kendall, MD;  Location: [facility];  Service: Cardiovascular;  Laterality: N/A;   CORONARY STENT INTERVENTION N/A 07/10/2017   Procedure: CORONARY STENT INTERVENTION;  Surgeon: Swaziland, Peter M, MD;  Location: [facility] INVASIVE CV LAB;  Service: Cardiovascular;  Laterality: N/A;   CORONARY STENT INTERVENTION N/A 05/31/2022   Procedure: CORONARY STENT INTERVENTION;  Surgeon: Yvonne Kendall, MD;  Location: [facility];  Service: Cardiovascular;  Laterality: N/A;   FEMORAL-FEMORAL BYPASS GRAFT Bilateral 05/04/2023   Procedure: LEFT TO RIGHT FEMORAL-FEMORAL ARTERY BYPASS;  Surgeon: Nada Libman, MD;  Location: [facility] OR;  Service: Vascular;  Laterality: Bilateral;   INSERTION OF ILIAC STENT Right 02/05/2023   Procedure: INSERTION OF RIGHT ILIAC STENT;  Surgeon: Nada Libman, MD;  Location: [facility] OR;  Service: Vascular;  Laterality: Right;   LEFT HEART CATH AND CORONARY ANGIOGRAPHY N/A 07/10/2017   Procedure: LEFT HEART CATH AND CORONARY ANGIOGRAPHY;  Surgeon: Swaziland, Peter M, MD;  Location: [facility] INVASIVE CV LAB;  Service: Cardiovascular;  Laterality: N/A;  LOWER EXTREMITY ANGIOGRAM N/A 02/05/2023   Procedure: RIGHT LOWER EXTREMITY ANGIOGRAM;  Surgeon: Nada Libman, MD;  Location: [facility] OR;  Service: Vascular;  Laterality: N/A;   PERIPHERAL VASCULAR INTERVENTION  07/26/2022   Procedure: PERIPHERAL VASCULAR INTERVENTION;  Surgeon: Runell Gess, MD;  Location: [facility] INVAS[facility] CV  LAB;  Service: Cardiovascular;;   PERIPHERAL VASCULAR INTERVENTION  04/22/2023   Procedure: PERIPHERAL VASCULAR INTERVENTION;  Surgeon: Leonie Douglas, MD;  Location: [facility];  Service: Cardiovascular;;  right common and ecternal iliac, right common femoral   RIGHT/LEFT HEART CATH AND CORONARY ANGIOGRAPHY N/A 05/31/2022   Procedure: RIGHT/LEFT HEART CATH AND CORONARY ANGIOGRAPHY;  Surgeon: Yvonne Kendall, MD;  Location: [facility] CV [facility];  Service: Cardiovascular;  Laterality: N/A;   ULTRASOUND GUIDANCE FOR VASCULAR ACCESS Bilateral 02/05/2023   Procedure: ULTRASOUND GUIDANCE FOR VASCULAR ACCESS;  Surgeon: Nada Libman, MD;  Location: [facility] OR;  Service: Vascular;  Laterality: Bilateral;    Social History   Socioeconomic History   Marital status: Single    Spouse name: Not on file   Number of children: Not on file   Years of education: Not on file   Highest education level: Not on file  Occupational History   Not on file  Tobacco Use   Smoking status: Former    Current packs/day: 0.00    Average packs/day: 0.5 packs/day for 10.0 years (5.0 ttl pk-yrs)    Types: Cigarettes    Quit date: 04/2023    Years since quitting: 0.3   Smokeless tobacco: Never  Vaping Use   Vaping status: Never Used  Substance and Sexual Activity   Alcohol use: No   Drug use: No   Sexual activity: Not on file  Other Topics Concern   Not on file  Social History Narrative   Not on file   Social Determinants of Health   Financial Resource Strain: Not on file  Food Insecurity: No Food Insecurity (04/29/2023)   Hunger Vital Sign    Worried About Running Out of Food in the Last Year: Never true    Ran Out of Food in the Last Year: Never true  Transportation Needs: No Transportation Needs (04/29/2023)   PRAPARE - Administrator, Civil Service (Medical): No    Lack of Transportation (Non-Medical): No  Physical Activity: Not on file  Stress: Not on file  Social Connections: Not on  file  Intimate Partner Violence: Not At Risk (04/29/2023)   Humiliation, Afraid, Rape, and Kick questionnaire    Fear of Current or Ex-Partner: No    Emotionally Abused: No    Physically Abused: No    Sexually Abused: No   History reviewed. No pertinent family history.  Current Outpatient Medications  Medication Sig Dispense Refill   aspirin EC 81 MG tablet Take 81 mg by mouth daily.     atorvastatin (LIPITOR) 80 MG tablet Take 1 tablet (80 mg total) by mouth daily at 6 PM. 30 tablet 0   carvedilol (COREG) 6.25 MG tablet Take 1 tablet (6.25 mg total) by mouth 2 (two) times daily with a meal. 60 tablet 0   Cholecalciferol (D3 HIGH POTENCY) 125 MCG (5000 UT) capsule Take 5,000 Units by mouth daily.     clonazePAM (KLONOPIN) 1 MG tablet Take 1 mg by mouth 2 (two) times daily.     ezetimibe (ZETIA) 10 MG tablet TAKE 1 TABLET BY MOUTH EVERY DAY 30 tablet 0   FARXIGA 10 MG TABS tablet Take  10 mg by mouth daily.      olmesartan (BENICAR) 20 MG tablet Take 1 tablet (20 mg total) by mouth daily. 90 tablet 3   oxyCODONE-acetaminophen (PERCOCET/ROXICET) 5-325 MG tablet Take 1 tablet by mouth every 6 (six) hours as needed for severe pain. (Patient not taking: Reported on 08/15/2023) 20 tablet 0   OZEMPIC, 2 MG/DOSE, 8 MG/3ML SOPN Inject 2 mg into the skin once a week.     pantoprazole (PROTONIX) 40 MG tablet Take 1 tablet (40 mg total) by mouth daily. 30 tablet 0   pregabalin (LYRICA) 150 MG capsule Take 150 mg by mouth 2 (two) times daily.     sertraline (ZOLOFT) 100 MG tablet Take 100 mg by mouth daily.     spironolactone (ALDACTONE) 25 MG tablet Take 25 mg by mouth daily.     sulfamethoxazole-trimethoprim (BACTRIM DS) 800-160 MG tablet Take 1 tablet by mouth every 12 (twelve) hours for 14 days. 28 tablet 0   ticagrelor (BRILINTA) 90 MG TABS tablet Take 1 tablet (90 mg total) by mouth 2 (two) times daily. 60 tablet 2   VASCEPA 1 g capsule Take 1 capsule (1 g total) by mouth 2 (two) times daily. 180  capsule 3   No current facility-administered medications for this visit.    Allergies  Allergen Reactions   Codeine Itching     REVIEW OF SYSTEMS:   [X]  denotes positive finding, [ ]  denotes negative finding Cardiac  Comments:  Chest pain or chest pressure:    Shortness of breath upon exertion:    Short of breath when lying flat:    Irregular heart rhythm:        Vascular    Pain in calf, thigh, or hip brought on by ambulation:    Pain in feet at night that wakes you up from your sleep:     Blood clot in your veins:    Leg swelling:         Pulmonary    Oxygen at home:    Productive cough:     Wheezing:         Neurologic    Sudden weakness in arms or legs:     Sudden numbness in arms or legs:     Sudden onset of difficulty speaking or slurred speech:    Temporary loss of vision in one eye:     Problems with dizziness:         Gastrointestinal    Blood in stool:     Vomited blood:         Genitourinary    Burning when urinating:     Blood in urine:        Psychiatric    Major depression:         Hematologic    Bleeding problems:    Problems with blood clotting too easily:        Skin    Rashes or ulcers:        Constitutional    Fever or chills:      PHYSICAL EXAMINATION:  Vitals:   08/15/23 1439  BP: (!) 158/91  Pulse: 79  Resp: 18  Temp: 97.7 F (36.5 C)  TempSrc: Temporal  SpO2: 96%  Weight: 214 lb 1.6 oz (97.1 kg)  Height: 5\' 9"  (1.753 m)    General:  WDWN in NAD; vital signs documented above Gait: Not observed HENT: WNL, normocephalic Pulmonary: normal non-labored breathing , without Rales, rhonchi,  wheezing Cardiac: regular HR  Abdomen: soft, NT, no masses Skin: without rashes Vascular Exam/Pulses: Palpable PT pulses bilaterally left stronger than right Extremities: Right fourth toe pictured below Musculoskeletal: no muscle wasting or atrophy  Neurologic: A&O X 3 Psychiatric:  The pt has Normal affect.    Non-Invasive  Vascular Imaging:   None    ASSESSMENT/PLAN:: 62 y.o. female here for follow up for surveillance of PAD and wound check of the right foot  -Ms. Canter continues to be without claudication or rest pain since her femorofemoral bypass.  Groin incisions have completely healed.  Feet are well-perfused with palpable PT pulses bilaterally.  Her fourth toe wound has been slow to heal.  She will be started on Bactrim for 2 weeks.  We will also refer her to podiatry for more intensive wound care.  She will return in about 1 month for aortoiliac and femorofemoral bypass imaging.  She knows to return to office sooner with any questions or concerns.   Emilie Rutter, PA-C [facility] [phone]  Clinic MD:   Myra Gianotti

## 2023-08-22 NOTE — Research (Signed)
60 week Follow-Up Visit Completed*   []  Not Necessary, No Potential Adverse Events Or Medication Issues Reported On Completed Subject Questionnaire   []  Yes, Contact With Subject/Alternate Contact Completed   [x]  Yes, No Contact With Subject/Alternate Contact Completed, But Electronic Health Record Was Reviewed   []  No, Unable To Contact Subject/Alternate Contact   Have you reviewed Ongoing medications on the Targeted Concomitant Medication form and updated the form as needed?   [x]  Yes   []  No   Subject Status*   []  Continuing In Follow-up   []  At Risk For Lost To Follow-up   []  Withdrawal From All Future Study Activities Including Passive Follow-up By Electronic Health Record Review Or Contact With Healthcare Provider Or Family Member/Friend   []  Death   Vital Status*   [x]  Alive   []  Deceased   []  Unknown   Last Known To Be Alive Source*   []  Subject Completed Follow-up Questionnaire/Seen In Person/Via Telephone Contact   []  Family Member or Caretaker   [x]  Primary Physician Or Medical Records   []  Publicly Available Source   []  Other

## 2023-08-26 ENCOUNTER — Ambulatory Visit: Payer: Medicaid Other | Admitting: Podiatry

## 2023-08-29 ENCOUNTER — Other Ambulatory Visit (HOSPITAL_COMMUNITY): Payer: Self-pay

## 2023-08-29 ENCOUNTER — Telehealth: Payer: Self-pay | Admitting: Pharmacy Technician

## 2023-08-29 NOTE — Telephone Encounter (Signed)
Pharmacy Patient Advocate Encounter   Received notification from Fax that prior authorization for vascepa is required/requested.   Insurance verification completed.   The patient is insured through [employer] .   Per test claim:  generic (copay is $4.00 for one month ) is preferred by the insurance.  If suggested medication is appropriate, Please send in a new RX and discontinue this one. If not, please advise as to why it's not appropriate so that we may request a Prior Authorization.

## 2023-09-01 ENCOUNTER — Ambulatory Visit (INDEPENDENT_AMBULATORY_CARE_PROVIDER_SITE_OTHER): Payer: Medicaid Other | Admitting: Podiatry

## 2023-09-01 ENCOUNTER — Encounter: Payer: Self-pay | Admitting: Podiatry

## 2023-09-01 DIAGNOSIS — I739 Peripheral vascular disease, unspecified: Secondary | ICD-10-CM

## 2023-09-01 DIAGNOSIS — L97512 Non-pressure chronic ulcer of other part of right foot with fat layer exposed: Secondary | ICD-10-CM

## 2023-09-01 DIAGNOSIS — E1142 Type 2 diabetes mellitus with diabetic polyneuropathy: Secondary | ICD-10-CM

## 2023-09-01 NOTE — Progress Notes (Signed)
Subjective:  Patient ID: Alice Bennett, female    DOB: 1961/08/06,   MRN: [id number]  Chief Complaint  Patient presents with   Toe Pain    Pt presents with ulcer third toe on the right that won't heal she had some on the other toe but they are all healed except that one on that toe.    62 y.o. female presents for concern of ulcer on her right fourth toe that has been present since June. She  underwent a fem fem bypass on 05/04/23 and been following with vascular. She has continued to struggle to heel the toe and was referred here for further wound care. She has been butting iodine on every other day at most.  Relates burning and tingling in their feet. Patient is diabetic and last A1c was  Lab Results  Component Value Date   HGBA1C 6.7 (H) 02/05/2023   .   PCP:  Erskine Emery, NP   . Denies any other pedal complaints. Denies n/v/f/c.   Past Medical History:  Diagnosis Date   CAD (coronary artery disease)    07/10/17 PCI/DES to LAD, EF 35-40%   Diabetes mellitus without complication (HCC)    Hyperlipidemia    Hypertension    NSTEMI (non-ST elevated myocardial infarction) (HCC)     Objective:  Physical Exam: Vascular: DP/PT pulses 2/4 bilateral. CFT <3 seconds. Absent hair growth on digits. Edema noted to bilateral lower extremities. Xerosis noted bilaterally.  Skin. No lacerations or abrasions bilateral feet. Nails 1-5 bilateral  are normal in appearance. Dorsal right fourth digit ulceration noted. Granular fibrinous base with surrounding hyperkeratosis. No erythema edema or purulence noted. No probe to bone.  Musculoskeletal: MMT 5/5 bilateral lower extremities in DF, PF, Inversion and Eversion. Deceased ROM in DF of ankle joint.  Neurological: Sensation intact to light touch. Protective sensation diminished bilateral.    Assessment:   1. Peripheral arterial disease (HCC)   2. Skin ulcer of fourth toe of right foot with fat layer exposed (HCC)   3. Type 2 diabetes mellitus  with diabetic polyneuropathy, without long-term current use of insulin (HCC)      Plan:  Patient was evaluated and treated and all questions answered. Ulcer right fourth toe with fat layer exposed  -Debridement as below. -Dressed with betadine, DSD. -Off-loading with surgical shoe. Dispensed -No abx indicated.  -Discussed glucose control and proper protein-rich diet.  -Discussed if any worsening redness, pain, fever or chills to call or may need to report to the emergency room. Patient expressed understanding.   Procedure: Excisional Debridement of Wound Rationale: Removal of non-viable soft tissue from the wound to promote healing.  Anesthesia: none Pre-Debridement Wound Measurements: Overlying callus and slough  Post-Debridement Wound Measurements: 0.5 cm x 0.3 cm x 0.2 cm  Type of Debridement: Sharp Excisional Tissue Removed: Non-viable soft tissue Depth of Debridement: subcutaneous tissue. Technique: Sharp excisional debridement to bleeding, viable wound base.  Dressing: Dry, sterile, compression dressing. Disposition: Patient tolerated procedure well. Patient to return in 2 week for follow-up.  No follow-ups on file.   Louann Sjogren, DPM

## 2023-09-02 ENCOUNTER — Other Ambulatory Visit (HOSPITAL_COMMUNITY): Payer: Self-pay

## 2023-09-09 ENCOUNTER — Other Ambulatory Visit: Payer: Self-pay

## 2023-09-09 DIAGNOSIS — I70213 Atherosclerosis of native arteries of extremities with intermittent claudication, bilateral legs: Secondary | ICD-10-CM

## 2023-09-09 DIAGNOSIS — I739 Peripheral vascular disease, unspecified: Secondary | ICD-10-CM

## 2023-09-12 ENCOUNTER — Other Ambulatory Visit: Payer: Self-pay | Admitting: Cardiovascular Disease

## 2023-09-13 ENCOUNTER — Other Ambulatory Visit: Payer: Self-pay | Admitting: Cardiovascular Disease

## 2023-09-16 ENCOUNTER — Ambulatory Visit: Payer: Medicaid Other | Admitting: Podiatry

## 2023-09-22 ENCOUNTER — Ambulatory Visit: Payer: Medicaid Other | Admitting: Podiatry

## 2023-09-29 ENCOUNTER — Ambulatory Visit: Payer: Medicaid Other | Admitting: Podiatry

## 2023-10-03 ENCOUNTER — Ambulatory Visit (HOSPITAL_COMMUNITY): Payer: Medicaid Other

## 2023-10-10 ENCOUNTER — Ambulatory Visit: Payer: Medicaid Other

## 2023-10-17 ENCOUNTER — Ambulatory Visit: Payer: Medicaid Other | Admitting: Podiatry

## 2023-10-25 DIAGNOSIS — Z955 Presence of coronary angioplasty implant and graft: Secondary | ICD-10-CM

## 2023-10-25 DIAGNOSIS — E785 Hyperlipidemia, unspecified: Secondary | ICD-10-CM | POA: Diagnosis present

## 2023-10-25 DIAGNOSIS — I2510 Atherosclerotic heart disease of native coronary artery without angina pectoris: Secondary | ICD-10-CM | POA: Diagnosis present

## 2023-10-25 DIAGNOSIS — Z885 Allergy status to narcotic agent status: Secondary | ICD-10-CM

## 2023-10-25 DIAGNOSIS — Z7985 Long-term (current) use of injectable non-insulin antidiabetic drugs: Secondary | ICD-10-CM

## 2023-10-25 DIAGNOSIS — Z7902 Long term (current) use of antithrombotics/antiplatelets: Secondary | ICD-10-CM

## 2023-10-25 DIAGNOSIS — M86171 Other acute osteomyelitis, right ankle and foot: Secondary | ICD-10-CM | POA: Diagnosis present

## 2023-10-25 DIAGNOSIS — I10 Essential (primary) hypertension: Secondary | ICD-10-CM | POA: Diagnosis present

## 2023-10-25 DIAGNOSIS — L03115 Cellulitis of right lower limb: Secondary | ICD-10-CM | POA: Diagnosis present

## 2023-10-25 DIAGNOSIS — E11628 Type 2 diabetes mellitus with other skin complications: Secondary | ICD-10-CM | POA: Diagnosis present

## 2023-10-25 DIAGNOSIS — I252 Old myocardial infarction: Secondary | ICD-10-CM

## 2023-10-25 DIAGNOSIS — Z79899 Other long term (current) drug therapy: Secondary | ICD-10-CM

## 2023-10-25 DIAGNOSIS — E1151 Type 2 diabetes mellitus with diabetic peripheral angiopathy without gangrene: Secondary | ICD-10-CM | POA: Diagnosis present

## 2023-10-25 DIAGNOSIS — F32A Depression, unspecified: Secondary | ICD-10-CM | POA: Diagnosis present

## 2023-10-25 DIAGNOSIS — Z87891 Personal history of nicotine dependence: Secondary | ICD-10-CM

## 2023-10-25 DIAGNOSIS — E1169 Type 2 diabetes mellitus with other specified complication: Principal | ICD-10-CM | POA: Diagnosis present

## 2023-10-25 DIAGNOSIS — Z7901 Long term (current) use of anticoagulants: Secondary | ICD-10-CM

## 2023-10-25 DIAGNOSIS — K08109 Complete loss of teeth, unspecified cause, unspecified class: Secondary | ICD-10-CM | POA: Diagnosis present

## 2023-10-25 DIAGNOSIS — F419 Anxiety disorder, unspecified: Secondary | ICD-10-CM | POA: Diagnosis present

## 2023-10-25 DIAGNOSIS — Z7982 Long term (current) use of aspirin: Secondary | ICD-10-CM

## 2023-10-26 ENCOUNTER — Other Ambulatory Visit: Payer: Self-pay

## 2023-10-26 ENCOUNTER — Emergency Department (HOSPITAL_COMMUNITY): Payer: Medicaid Other

## 2023-10-26 ENCOUNTER — Inpatient Hospital Stay (HOSPITAL_COMMUNITY): Payer: Medicaid Other

## 2023-10-26 ENCOUNTER — Inpatient Hospital Stay (HOSPITAL_COMMUNITY)
Admission: EM | Admit: 2023-10-26 | Discharge: 2023-10-27 | DRG: 617 | Disposition: A | Discharge status: Home / Self Care | Payer: Medicaid Other | Attending: Internal Medicine | Admitting: Internal Medicine

## 2023-10-26 ENCOUNTER — Encounter (HOSPITAL_COMMUNITY): Payer: Self-pay

## 2023-10-26 DIAGNOSIS — I10 Essential (primary) hypertension: Secondary | ICD-10-CM | POA: Diagnosis present

## 2023-10-26 DIAGNOSIS — F32A Depression, unspecified: Secondary | ICD-10-CM | POA: Diagnosis present

## 2023-10-26 DIAGNOSIS — M869 Osteomyelitis, unspecified: Secondary | ICD-10-CM | POA: Diagnosis not present

## 2023-10-26 DIAGNOSIS — E1169 Type 2 diabetes mellitus with other specified complication: Secondary | ICD-10-CM | POA: Diagnosis present

## 2023-10-26 DIAGNOSIS — M79661 Pain in right lower leg: Secondary | ICD-10-CM | POA: Diagnosis not present

## 2023-10-26 DIAGNOSIS — E118 Type 2 diabetes mellitus with unspecified complications: Secondary | ICD-10-CM | POA: Diagnosis present

## 2023-10-26 DIAGNOSIS — F419 Anxiety disorder, unspecified: Secondary | ICD-10-CM | POA: Diagnosis present

## 2023-10-26 DIAGNOSIS — E785 Hyperlipidemia, unspecified: Secondary | ICD-10-CM | POA: Diagnosis present

## 2023-10-26 DIAGNOSIS — E1151 Type 2 diabetes mellitus with diabetic peripheral angiopathy without gangrene: Secondary | ICD-10-CM | POA: Diagnosis present

## 2023-10-26 DIAGNOSIS — L03115 Cellulitis of right lower limb: Secondary | ICD-10-CM | POA: Diagnosis present

## 2023-10-26 DIAGNOSIS — Z7902 Long term (current) use of antithrombotics/antiplatelets: Secondary | ICD-10-CM | POA: Diagnosis not present

## 2023-10-26 DIAGNOSIS — Z7982 Long term (current) use of aspirin: Secondary | ICD-10-CM | POA: Diagnosis not present

## 2023-10-26 DIAGNOSIS — Z7985 Long-term (current) use of injectable non-insulin antidiabetic drugs: Secondary | ICD-10-CM | POA: Diagnosis not present

## 2023-10-26 DIAGNOSIS — Z87891 Personal history of nicotine dependence: Secondary | ICD-10-CM | POA: Diagnosis not present

## 2023-10-26 DIAGNOSIS — I2510 Atherosclerotic heart disease of native coronary artery without angina pectoris: Secondary | ICD-10-CM | POA: Diagnosis present

## 2023-10-26 DIAGNOSIS — K08109 Complete loss of teeth, unspecified cause, unspecified class: Secondary | ICD-10-CM | POA: Diagnosis present

## 2023-10-26 DIAGNOSIS — Z885 Allergy status to narcotic agent status: Secondary | ICD-10-CM | POA: Diagnosis not present

## 2023-10-26 DIAGNOSIS — Z955 Presence of coronary angioplasty implant and graft: Secondary | ICD-10-CM | POA: Diagnosis not present

## 2023-10-26 DIAGNOSIS — E11628 Type 2 diabetes mellitus with other skin complications: Secondary | ICD-10-CM | POA: Diagnosis present

## 2023-10-26 DIAGNOSIS — M86171 Other acute osteomyelitis, right ankle and foot: Secondary | ICD-10-CM | POA: Diagnosis present

## 2023-10-26 DIAGNOSIS — Z7901 Long term (current) use of anticoagulants: Secondary | ICD-10-CM | POA: Diagnosis not present

## 2023-10-26 DIAGNOSIS — Z79899 Other long term (current) drug therapy: Secondary | ICD-10-CM | POA: Diagnosis not present

## 2023-10-26 DIAGNOSIS — I252 Old myocardial infarction: Secondary | ICD-10-CM | POA: Diagnosis not present

## 2023-10-26 LAB — GLUCOSE, CAPILLARY
Glucose-Capillary: 126 mg/dL — ABNORMAL HIGH (ref 70–99)
Glucose-Capillary: 133 mg/dL — ABNORMAL HIGH (ref 70–99)
Glucose-Capillary: 134 mg/dL — ABNORMAL HIGH (ref 70–99)

## 2023-10-26 LAB — HEMOGLOBIN A1C
Hgb A1c MFr Bld: 6.3 % — ABNORMAL HIGH (ref 4.8–5.6)
Mean Plasma Glucose: 134.11 mg/dL

## 2023-10-26 LAB — BASIC METABOLIC PANEL
Anion gap: 7 (ref 5–15)
BUN: 14 mg/dL (ref 8–23)
CO2: 24 mmol/L (ref 22–32)
Calcium: 9.1 mg/dL (ref 8.9–10.3)
Chloride: 105 mmol/L (ref 98–111)
Creatinine, Ser: 0.93 mg/dL (ref 0.44–1.00)
GFR, Estimated: >60 mL/min (ref >60)
Glucose, Bld: 107 mg/dL — ABNORMAL HIGH (ref 70–99)
Potassium: 4.1 mmol/L (ref 3.5–5.1)
Sodium: 136 mmol/L (ref 135–145)

## 2023-10-26 LAB — CBC
HCT: 42.1 % (ref 36.0–46.0)
Hemoglobin: 13.6 g/dL (ref 12.0–15.0)
MCH: 27.6 pg (ref 26.0–34.0)
MCHC: 32.3 g/dL (ref 30.0–36.0)
MCV: 85.6 fL (ref 80.0–100.0)
Platelets: 209 10*3/uL (ref 150–400)
RBC: 4.92 MIL/uL (ref 3.87–5.11)
RDW: 15.5 % (ref 11.5–15.5)
WBC: 9.3 10*3/uL (ref 4.0–10.5)
nRBC: 0 % (ref 0.0–0.2)

## 2023-10-26 LAB — LAB REPORT - SCANNED
A1c: 6.2
EGFR: 54.4

## 2023-10-26 MED ORDER — PREGABALIN 75 MG PO CAPS
150.0000 mg | ORAL_CAPSULE | Freq: Three times a day (TID) | ORAL | Status: DC
  Administered 2023-10-26 – 2023-10-27 (×4): 150 mg via ORAL
  Filled 2023-10-26 (×4): qty 2

## 2023-10-26 MED ORDER — DAPAGLIFLOZIN PROPANEDIOL 10 MG PO TABS
10.0000 mg | ORAL_TABLET | Freq: Every day | ORAL | Status: DC
  Administered 2023-10-27: 10 mg via ORAL
  Filled 2023-10-26 (×2): qty 1

## 2023-10-26 MED ORDER — CLONAZEPAM 1 MG PO TABS
1.0000 mg | ORAL_TABLET | Freq: Two times a day (BID) | ORAL | Status: DC
Start: 2023-10-26 — End: 2023-10-27
  Administered 2023-10-26 – 2023-10-27 (×3): 1 mg via ORAL
  Filled 2023-10-26 (×3): qty 1

## 2023-10-26 MED ORDER — VANCOMYCIN HCL 2000 MG/400ML IV SOLN
2000.0000 mg | Freq: Once | INTRAVENOUS | Status: AC
  Administered 2023-10-26: 2000 mg via INTRAVENOUS
  Filled 2023-10-26: qty 400

## 2023-10-26 MED ORDER — IRBESARTAN 150 MG PO TABS
150.0000 mg | ORAL_TABLET | Freq: Every day | ORAL | Status: DC
  Administered 2023-10-26 – 2023-10-27 (×2): 150 mg via ORAL
  Filled 2023-10-26 (×3): qty 1

## 2023-10-26 MED ORDER — INSULIN ASPART 100 UNIT/ML IJ SOLN
0.0000 [IU] | Freq: Three times a day (TID) | INTRAMUSCULAR | Status: DC
  Administered 2023-10-26 (×2): 1 [IU] via SUBCUTANEOUS
  Administered 2023-10-27: 2 [IU] via SUBCUTANEOUS
  Filled 2023-10-26: qty 0.09

## 2023-10-26 MED ORDER — SPIRONOLACTONE 25 MG PO TABS
25.0000 mg | ORAL_TABLET | Freq: Every day | ORAL | Status: DC
Start: 2023-10-26 — End: 2023-10-27
  Administered 2023-10-27: 25 mg via ORAL
  Filled 2023-10-26 (×2): qty 1

## 2023-10-26 MED ORDER — TICAGRELOR 90 MG PO TABS
90.0000 mg | ORAL_TABLET | Freq: Two times a day (BID) | ORAL | Status: DC
  Administered 2023-10-26 – 2023-10-27 (×2): 90 mg via ORAL
  Filled 2023-10-26 (×3): qty 1

## 2023-10-26 MED ORDER — SERTRALINE HCL 100 MG PO TABS
100.0000 mg | ORAL_TABLET | Freq: Every day | ORAL | Status: DC
  Administered 2023-10-26 – 2023-10-27 (×2): 100 mg via ORAL
  Filled 2023-10-26 (×2): qty 1

## 2023-10-26 MED ORDER — ALBUTEROL SULFATE (2.5 MG/3ML) 0.083% IN NEBU: 2.5000 mg | INHALATION_SOLUTION | RESPIRATORY_TRACT | Status: AC | PRN

## 2023-10-26 MED ORDER — METRONIDAZOLE 500 MG PO TABS
500.0000 mg | ORAL_TABLET | Freq: Two times a day (BID) | ORAL | Status: DC
  Administered 2023-10-26 – 2023-10-27 (×3): 500 mg via ORAL
  Filled 2023-10-26 (×3): qty 1

## 2023-10-26 MED ORDER — VANCOMYCIN HCL 750 MG/150ML IV SOLN
750.0000 mg | Freq: Two times a day (BID) | INTRAVENOUS | Status: DC
  Administered 2023-10-26 – 2023-10-27 (×2): 750 mg via INTRAVENOUS
  Filled 2023-10-26 (×2): qty 150

## 2023-10-26 MED ORDER — TRAZODONE HCL 50 MG PO TABS
25.0000 mg | ORAL_TABLET | Freq: Every evening | ORAL | Status: DC | PRN
  Administered 2023-10-26: 25 mg via ORAL
  Filled 2023-10-26: qty 1

## 2023-10-26 MED ORDER — ONDANSETRON HCL 4 MG/2ML IJ SOLN: 4.0000 mg | Freq: Three times a day (TID) | INTRAMUSCULAR | Status: AC | PRN

## 2023-10-26 MED ORDER — OXYCODONE HCL 5 MG PO TABS
5.0000 mg | ORAL_TABLET | ORAL | Status: DC | PRN
  Administered 2023-10-26 (×2): 5 mg via ORAL
  Filled 2023-10-26 (×2): qty 1

## 2023-10-26 MED ORDER — EZETIMIBE 10 MG PO TABS
10.0000 mg | ORAL_TABLET | Freq: Every day | ORAL | Status: DC
  Administered 2023-10-26: 10 mg via ORAL
  Filled 2023-10-26: qty 1

## 2023-10-26 MED ORDER — SODIUM CHLORIDE 0.9 % IV SOLN
2.0000 g | INTRAVENOUS | Status: DC
  Administered 2023-10-26 – 2023-10-27 (×2): 2 g via INTRAVENOUS
  Filled 2023-10-26 (×2): qty 20

## 2023-10-26 MED ORDER — INSULIN ASPART 100 UNIT/ML IJ SOLN
0.0000 [IU] | Freq: Every day | INTRAMUSCULAR | Status: DC
  Filled 2023-10-26: qty 0.05

## 2023-10-26 MED ORDER — HYDRALAZINE HCL 20 MG/ML IJ SOLN: 5.0000 mg | Freq: Four times a day (QID) | INTRAMUSCULAR | Status: DC | PRN

## 2023-10-26 MED ORDER — HYDROMORPHONE HCL 1 MG/ML IJ SOLN: 0.5000 mg | INTRAMUSCULAR | Status: DC | PRN

## 2023-10-26 MED ORDER — ACETAMINOPHEN 325 MG PO TABS: 650.0000 mg | ORAL_TABLET | Freq: Four times a day (QID) | ORAL | Status: DC | PRN

## 2023-10-26 MED ORDER — ASPIRIN 81 MG PO TBEC
81.0000 mg | DELAYED_RELEASE_TABLET | Freq: Every day | ORAL | Status: DC
  Administered 2023-10-26 – 2023-10-27 (×2): 81 mg via ORAL
  Filled 2023-10-26 (×2): qty 1

## 2023-10-26 MED ORDER — ACETAMINOPHEN 650 MG RE SUPP: 650.0000 mg | Freq: Four times a day (QID) | RECTAL | Status: DC | PRN

## 2023-10-26 MED ORDER — ENOXAPARIN SODIUM 40 MG/0.4ML IJ SOSY
40.0000 mg | PREFILLED_SYRINGE | INTRAMUSCULAR | Status: DC
  Administered 2023-10-26: 40 mg via SUBCUTANEOUS
  Filled 2023-10-26: qty 0.4

## 2023-10-26 MED ORDER — ATORVASTATIN CALCIUM 20 MG PO TABS
80.0000 mg | ORAL_TABLET | Freq: Every day | ORAL | Status: DC
  Administered 2023-10-26: 80 mg via ORAL
  Filled 2023-10-26: qty 4

## 2023-10-26 MED ORDER — PANTOPRAZOLE SODIUM 40 MG PO TBEC
40.0000 mg | DELAYED_RELEASE_TABLET | Freq: Every day | ORAL | Status: DC
  Administered 2023-10-26 – 2023-10-27 (×2): 40 mg via ORAL
  Filled 2023-10-26 (×2): qty 1

## 2023-10-26 MED ORDER — CARVEDILOL 6.25 MG PO TABS
6.2500 mg | ORAL_TABLET | Freq: Two times a day (BID) | ORAL | Status: DC
Start: 2023-10-26 — End: 2023-10-27
  Administered 2023-10-26 – 2023-10-27 (×3): 6.25 mg via ORAL
  Filled 2023-10-26: qty 2
  Filled 2023-10-26 (×2): qty 1

## 2023-10-26 NOTE — ED Provider Notes (Signed)
Alice Bennett   CSN: [id number] Arrival date & time: 10/25/23  2355     History  Chief Complaint  Patient presents with   Foot Pain    Alice Bennett is a 62 y.o. female.  The history is provided by the patient and medical records.  Foot Pain  Alice Bennett is a 62 y.o. female who presents to the Emergency Department complaining of foot pain.  Presents to the emergency department for evaluation of progressive right foot pain that initially started in July.  She states that over the last several days she has had a significant increase of pain with chills and increased pain and redness to the area.  She saw her PCP on Monday and was referred to wound care but is not scheduled to follow-up with them until January 20.  She reports intermittent drainage from the ulcer on her fourth toe for the last couple of weeks.  No reported fever.  She has not been on antibiotics for over a month.   Hx/o CAD, DM, PAD     Home Medications Prior to Admission medications   Medication Sig Start Date End Date Taking? Authorizing Provider  aspirin EC 81 MG tablet Take 81 mg by mouth daily.   Yes [provider]  atorvastatin (LIPITOR) 80 MG tablet Take 1 tablet (80 mg total) by mouth daily at 6 PM. 07/19/17  Yes Lennette Bihari, MD  carvedilol (COREG) 6.25 MG tablet Take 1 tablet (6.25 mg total) by mouth 2 (two) times daily with a meal. 07/19/17  Yes Lennette Bihari, MD  Cholecalciferol (D3 HIGH POTENCY) 125 MCG (5000 UT) capsule Take 5,000 Units by mouth daily.   Yes [provider]  ezetimibe (ZETIA) 10 MG tablet TAKE 1 TABLET BY MOUTH EVERY DAY 09/13/23  Yes Lennette Bihari, MD  FARXIGA 10 MG TABS tablet Take 10 mg by mouth daily.  03/28/19  Yes [provider]  olmesartan (BENICAR) 20 MG tablet Take 1 tablet (20 mg total) by mouth daily. 03/09/21  Yes Lennette Bihari, MD  OZEMPIC, 2 MG/DOSE, 8 MG/3ML SOPN Inject 2 mg  into the skin once a week. 02/01/23  Yes [provider]  pantoprazole (PROTONIX) 40 MG tablet Take 1 tablet (40 mg total) by mouth daily. 07/19/17  Yes Lennette Bihari, MD  pregabalin (LYRICA) 150 MG capsule Take 150 mg by mouth in the morning, at noon, and at bedtime.   Yes [provider]  sertraline (ZOLOFT) 100 MG tablet Take 100 mg by mouth daily.   Yes [provider]  spironolactone (ALDACTONE) 25 MG tablet Take 25 mg by mouth daily.   Yes [provider]  ticagrelor (BRILINTA) 90 MG TABS tablet Take 1 tablet (90 mg total) by mouth 2 (two) times daily. 02/06/23  Yes Dorrell, Molly Maduro, MD  XARELTO 20 MG TABS tablet Take 20 mg by mouth daily. 05/25/23  Yes [provider]  clonazePAM (KLONOPIN) 1 MG tablet Take 1 mg by mouth 2 (two) times daily.    [provider]  doxycycline (VIBRAMYCIN) 100 MG capsule Take 100 mg by mouth 2 (two) times daily. Patient not taking: Reported on 10/26/2023 06/20/23   [provider]  ondansetron (ZOFRAN) 4 MG tablet Take 4 mg by mouth every 6 (six) hours. 07/13/23   [provider]  oxyCODONE-acetaminophen (PERCOCET/ROXICET) 5-325 MG tablet Take 1 tablet by mouth every 6 (six) hours as needed for  severe pain. Patient not taking: Reported on 08/15/2023 06/27/23   Emilie Rutter, PA-C  VASCEPA 1 g capsule Take 1 capsule (1 g total) by mouth 2 (two) times daily. Patient not taking: Reported on 10/26/2023 10/21/22   Lennette Bihari, MD      Allergies    Codeine    Review of Systems   Review of Systems  All other systems reviewed and are negative.   Physical Exam Updated Vital Signs BP (!) 152/77 (BP Location: Right Arm)   Pulse 78   Temp 97.8 F (36.6 C) (Oral)   Resp 16   Ht 5\' 9"  (1.753 m)   Wt 96.2 kg   SpO2 98%   BMI 31.31 kg/m  Physical Exam Vitals and nursing Bennett reviewed.  Constitutional:      Appearance: She is well-developed.  HENT:     Head: Normocephalic and  atraumatic.  Cardiovascular:     Rate and Rhythm: Normal rate and regular rhythm.  Pulmonary:     Effort: Pulmonary effort is normal. No respiratory distress.  Musculoskeletal:     Comments: Soft tissue swelling erythema and tenderness to the right foot.  There is an ulcer on the fourth toe of the right foot with small amt of drainage.  Unable to palpated DP pulses.  + doppler pulses in BLE.    Skin:    General: Skin is warm and dry.  Neurological:     Mental Status: She is alert and oriented to person, place, and time.  Psychiatric:        Behavior: Behavior normal.     ED Results / Procedures / Treatments   Labs (all labs ordered are listed, but only abnormal results are displayed) Labs Reviewed  BASIC METABOLIC PANEL - Abnormal; Notable for the following components:      Result Value   Glucose, Bld 107 (*)    All other components within normal limits  CBC    EKG None  Radiology DG Foot Complete Right Result Date: 10/26/2023 CLINICAL DATA:  Concern for osteomyelitis to the dorsum of the fourth toe. Right foot pain and swelling for 6 months and open wound on the right fourth toe. EXAM: RIGHT FOOT COMPLETE - 3+ VIEW COMPARISON:  06/21/2023 FINDINGS: No acute fracture or dislocation. No radiographic evidence of osteomyelitis. Swelling about the fourth toe. IMPRESSION: No radiographic evidence of osteomyelitis. Swelling about the fourth toe. Electronically Signed   By: Minerva Fester M.D.   On: 10/26/2023 01:00    Procedures Procedures    Medications Ordered in ED Medications  cefTRIAXone (ROCEPHIN) 2 g in sodium chloride 0.9 % 100 mL IVPB (2 g Intravenous New Bag/Given 10/26/23 0510)    And  metroNIDAZOLE (FLAGYL) tablet 500 mg (has no administration in time range)  oxyCODONE (Oxy IR/ROXICODONE) immediate release tablet 5 mg (5 mg Oral Given 10/26/23 1610)    ED Course/ Medical Decision Making/ A&P                                 Medical Decision Making Amount  and/or Complexity of Data Reviewed Radiology: ordered.  Risk Prescription drug management. Decision regarding hospitalization.   Patient with history of PAD status post bypass surgery on anticoagulation as well as diabetes here for evaluation of progressive pain, swelling to the right foot with drainage from her fourth toe.  She is warm in the foot with Doppler pulses present, unable to palpate  pedal pulses.  She does have erythema and soft tissue swelling throughout the foot that extends to the ankle.  Labs are reassuring.  No evidence of osteomyelitis on plain film.  Will start on antibiotics given severity of her swelling and rapid progression over the last few days.  Medicine consulted for admission for ongoing care.       Final Clinical Impression(s) / ED Diagnoses Final diagnoses:  Cellulitis of right lower extremity    Rx / DC Orders ED Discharge Orders     None         Tilden Fossa, MD 10/26/23 [phone]

## 2023-10-26 NOTE — TOC Initial Note (Signed)
Transition of Care [facility]) - Initial/Assessment Note    Patient Details  Name: Alice Bennett MRN: [id number] Date of Birth: [birth year]  Transition of [facility]) CM/SW Contact:    Lanier Clam, RN Phone Number: 10/26/2023, 2:09 PM  Clinical Narrative: d/c plan home.                  Expected Discharge Plan: Home/Self Care Barriers to Discharge: Continued Medical Work up   Patient Goals and CMS Choice            Expected Discharge Plan and Services                                              Prior Living Arrangements/Services                       Activities of Daily Living   ADL Screening (condition at time of admission) Independently performs ADLs?: Yes (appropriate for developmental age) Is the patient deaf or have difficulty hearing?: No Does the patient have difficulty seeing, even when wearing glasses/contacts?: No Does the patient have difficulty concentrating, remembering, or making decisions?: No  Permission Sought/Granted                  Emotional Assessment              Admission diagnosis:  Diabetic foot (HCC) [E11.8] Cellulitis of right lower extremity [L03.115] Patient Active Problem List   Diagnosis Date Noted   Diabetic foot (HCC) 10/26/2023   Critical lower limb ischemia (HCC) 04/22/2023   Critical limb ischemia of right lower extremity (HCC) 02/04/2023   Superior mesenteric artery stenosis (HCC) 02/04/2023   Claudication in peripheral vascular disease (HCC) 07/26/2022   Peripheral arterial disease (HCC) 07/07/2022   Tobacco use 06/01/2022   NSTEMI (non-ST elevated myocardial infarction) (HCC) 05/30/2022   Hyperlipidemia 07/12/2017   Hypertension 07/12/2017   Non-ST elevation (NSTEMI) myocardial infarction [facility])    Ischemic cardiomyopathy    Unstable angina (HCC) 07/09/2017   PCP:  Erskine Emery, NP Pharmacy:   [facility] [phone] Lorenza Evangelist, [place] - [address] AT [facility] [address] [place] [address] Phone: [phone] Fax: [phone]  [facility] #[id number] Ginette Otto, [place] - [address] AT [facility] OF [facility] & [address] [place] [address] Phone: [phone] Fax: [phone]     Social Drivers of Health (SDOH) Social History: SDOH Screenings   Food Insecurity: Patient Declined (10/26/2023)  Housing: Patient Declined (10/26/2023)  Transportation Needs: Patient Declined (10/26/2023)  Utilities: Patient Declined (10/26/2023)  Tobacco Use: Medium Risk (10/26/2023)   SDOH Interventions:     Readmission Risk Interventions    05/06/2023    4:37 PM  Readmission Risk Prevention Plan  Transportation Screening Complete  PCP or Specialist Appt within 5-7 Days Complete  Home Care Screening Complete  Medication Review (RN CM) Complete

## 2023-10-26 NOTE — Progress Notes (Signed)
Pharmacy Antibiotic Note  JAVANA RIOPELLE is a 62 y.o. female admitted on 10/26/2023 with osteomyelitis. Pharmacy has been consulted for Vancomycin dosing.  Plan: Vancomycin 2g IV x 1, then 750mg  IV q12h Vancomycin levels at steady state, as indicated Ceftriaxone/Metronidazole per MD Monitor renal function, cultures, clinical course  Height: 5\' 9"  (175.3 cm) Weight: 96.2 kg (212 lb) IBW/kg (Calculated) : 66.2  Temp (24hrs), Avg:98 F (36.7 C), Min:97.7 F (36.5 C), Max:98.2 F (36.8 C)  Recent Labs  Lab 10/26/23 0046  WBC 9.3  CREATININE 0.93    Estimated Creatinine Clearance: 77.4 mL/min (by C-G formula based on SCr of 0.93 mg/dL).    Allergies  Allergen Reactions   Codeine Itching    Antimicrobials this admission: 12/18 Vancomycin >> 12/18 Ceftriaxone >> 12/18 Metronidazole >>  Dose adjustments this admission: --  Microbiology results: None ordered  Thank you for allowing pharmacy to be a part of this patient's care.   Greer Pickerel, PharmD, BCPS Clinical Pharmacist 10/26/2023 12:08 PM

## 2023-10-26 NOTE — ED Triage Notes (Signed)
Pt states that she has had right foot pain and swelling x 6 months. Pt has open wound on right 4th toe. Pt reports that she has seen podiatrist for same and had an outpt xray done at [facility] today but [place] not know results.Pt states that she has an appt with wound care but not until January. Pt states that her pain has worsened x 1 day.

## 2023-10-26 NOTE — Plan of Care (Signed)
Called patient this evening to discuss MRI findings and plan for right 4th toe amputation tomorrow am.  She is in agreement and wants to proceed with my recommendation for 4th toe amputation due to osteomyelitis of the 4th toe.  Full consult note to follow in pre op tomorrow.  NPO past MN for OR tomorrow.         Corinna Gab, DPM [facility] / [facility]                   10/26/2023

## 2023-10-26 NOTE — ED Notes (Signed)
ED TO INPATIENT HANDOFF REPORT  ED Nurse Name and Phone #:  Suzanna Obey [phone]  S Name/Age/Gender Alice Bennett 62 y.o. female Room/Bed: WA02/WA02  Code Status   Code Status: Full Code  Home/SNF/Other Home Patient oriented to: self, place, time, and situation Is this baseline? 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 Pt has open wound on right 4th toe. Pt reports that she has seen podiatrist for same and had an outpt xray done at [facility] today but [place] not know results.Pt states that she has an appt with wound care but not until January. Pt states that her pain has worsened x 1 day.   Allergies Allergies  Allergen Reactions   Codeine Itching    Level of Care/Admitting Diagnosis ED Disposition     ED Disposition  Admit   Condition  --   Comment  Hospital Area: [facility] [[id number]]  Level of Care: Med-Surg [16]  May admit patient to [facility] or [facility] if equivalent level of care is available:: Yes  Covid Evaluation: Asymptomatic - no recent exposure (last 10 days) testing not required  Diagnosis: Diabetic foot [facility]) [[id number]]  Admitting Physician: Andris Baumann [[id number]]  Attending Physician: Andris Baumann [[id number]]  Certification:: I certify this patient will need inpatient services for at least 2 midnights  Expected Medical Readiness: 10/29/2023          B Medical/Surgery History Past Medical History:  Diagnosis Date   CAD (coronary artery disease)    07/10/17 PCI/DES to LAD, EF 35-40%   Diabetes mellitus without complication (HCC)    Hyperlipidemia    Hypertension    NSTEMI (non-ST elevated myocardial infarction) [facility])    Past Surgical History:  Procedure Laterality Date   ABDOMINAL AORTOGRAM W/LOWER EXTREMITY N/A 07/26/2022   Procedure: ABDOMINAL AORTOGRAM W/LOWER EXTREMITY;  Surgeon: Runell Gess, MD;  [facility] INVAS[facility] CV [facility];  Service: Cardiovascular;  Laterality: N/A;   ABDOMINAL AORTOGRAM W/LOWER EXTREMITY N/A 04/22/2023   Procedure: ABDOMINAL AORTOGRAM W/LOWER EXTREMITY;  Surgeon: Leonie Douglas, MD;  [facility];  Service: Cardiovascular;  Laterality: N/A;   AORTOGRAM Bilateral 02/05/2023   Procedure: AORTOGRAM WITH RUN OFF;  Surgeon: Nada Libman, MD;  Location: [facility] OR;  Service: Vascular;  Laterality: Bilateral;   APPLICATION OF WOUND VAC Bilateral 05/04/2023   Procedure: APPLICATION OF WOUND VAC;  Surgeon: Nada Libman, MD;  [facility] OR;  Service: Vascular;  Laterality: Bilateral;   CORONARY PRESSURE/FFR STUDY N/A 07/10/2017   Procedure: INTRAVASCULAR PRESSURE WIRE/FFR STUDY;  Surgeon: Swaziland, Peter M, MD;  [facility];  Service: Cardiovascular;  Laterality: N/A;   CORONARY PRESSURE/FFR STUDY N/A 05/31/2022   Procedure: INTRAVASCULAR PRESSURE WIRE/FFR STUDY;  Surgeon: Yvonne Kendall, MD;  [facility];  Service: Cardiovascular;  Laterality: N/A;   CORONARY STENT INTERVENTION N/A 07/10/2017   Procedure: CORONARY STENT INTERVENTION;  Surgeon: Swaziland, Peter M, MD;  Location: [facility] INVASIVE CV LAB;  Service: Cardiovascular;  Laterality: N/A;   CORONARY STENT INTERVENTION N/A 05/31/2022   Procedure: CORONARY STENT INTERVENTION;  Surgeon: Yvonne Kendall, MD;  [facility];  Service: Cardiovascular;  Laterality: N/A;   FEMORAL-FEMORAL BYPASS GRAFT Bilateral 05/04/2023   Procedure: LEFT TO RIGHT FEMORAL-FEMORAL ARTERY BYPASS;  Surgeon: Nada Libman, MD;  Location: [facility]  OR;  Service: Vascular;  Laterality: Bilateral;   INSERTION OF ILIAC STENT Right 02/05/2023   Procedure: INSERTION OF RIGHT ILIAC STENT;  Surgeon: Nada Libman, MD;  [facility];  Service: Vascular;  Laterality: Right;   LEFT HEART CATH AND CORONARY ANGIOGRAPHY N/A 07/10/2017   Procedure: LEFT HEART CATH AND CORONARY ANGIOGRAPHY;   Surgeon: Swaziland, Peter M, MD;  Location: [facility] INVASIVE CV LAB;  Service: Cardiovascular;  Laterality: N/A;   LOWER EXTREMITY ANGIOGRAM N/A 02/05/2023   Procedure: RIGHT LOWER EXTREMITY ANGIOGRAM;  Surgeon: Nada Libman, MD;  [facility];  Service: Vascular;  Laterality: N/A;   PERIPHERAL VASCULAR INTERVENTION  07/26/2022   Procedure: PERIPHERAL VASCULAR INTERVENTION;  Surgeon: Runell Gess, MD;  [facility];  Service: Cardiovascular;;   PERIPHERAL VASCULAR INTERVENTION  04/22/2023   Procedure: PERIPHERAL VASCULAR INTERVENTION;  Surgeon: Leonie Douglas, MD;  [facility];  Service: Cardiovascular;;  right common and ecternal iliac, right common femoral   RIGHT/LEFT HEART CATH AND CORONARY ANGIOGRAPHY N/A 05/31/2022   Procedure: RIGHT/LEFT HEART CATH AND CORONARY ANGIOGRAPHY;  Surgeon: Yvonne Kendall, MD;  [facility];  Service: Cardiovascular;  Laterality: N/A;   ULTRASOUND GUIDANCE FOR VASCULAR ACCESS Bilateral 02/05/2023   Procedure: ULTRASOUND GUIDANCE FOR VASCULAR ACCESS;  Surgeon: Nada Libman, MD;  [facility];  Service: Vascular;  Laterality: Bilateral;     A IV Location/Drains/Wounds Patient Lines/Drains/Airways Status     Active Line/Drains/Airways     Name Placement date Placement time Site Days   Peripheral IV 10/26/23 20 G 1" Right Antecubital 10/26/23  0509  Antecubital  less than 1   Negative Pressure Wound Therapy Groin Right 05/04/23  1355  --  175   Negative Pressure Wound Therapy Groin Left 05/04/23  1356  --  175            Intake/Output Last 24 hours No intake or output data in the 24 hours ending 10/26/23 0801  Labs/Imaging Results for orders placed or performed during the hospital encounter of 10/26/23 (from the past 48 hours)  CBC     Status: None   Collection Time: 10/26/23 12:46 AM  Result Value Ref Range   WBC 9.3 4.0 - 10.5 K/uL   RBC 4.92 3.87 - 5.11 MIL/uL   Hemoglobin 13.6 12.0 -  15.0 g/dL   HCT 57.8 46.9 - 62.9 %   MCV 85.6 80.0 - 100.0 fL   MCH 27.6 26.0 - 34.0 pg   MCHC 32.3 30.0 - 36.0 g/dL   RDW 52.8 41.3 - 24.4 %   Platelets 209 150 - 400 K/uL   nRBC 0.0 0.0 - 0.2 %    Comment: Performed at [facility], [address], [place], [place] [address]  Basic metabolic panel     Status: Abnormal   Collection Time: 10/26/23 12:46 AM  Result Value Ref Range   Sodium 136 135 - 145 mmol/L   Potassium 4.1 3.5 - 5.1 mmol/L   Chloride 105 98 - 111 mmol/L   CO2 24 22 - 32 mmol/L   Glucose, Bld 107 (H) 70 - 99 mg/dL    Comment: Glucose reference range applies only to samples taken after fasting for at least 8 hours.   BUN 14 8 - 23 mg/dL   Creatinine, Ser 2.53 0.44 - 1.00 mg/dL   Calcium 9.1 8.9 - 66.4 mg/dL   GFR, Estimated >40 >34 mL/min    Comment: (  NOTE) Calculated using the CKD-EPI Creatinine Equation (2021)    Anion gap 7 5 - 15    Comment: Performed at [facility], [address], [place], [place] [address]   DG Foot Complete Right Result Date: 10/26/2023 CLINICAL DATA:  Concern for osteomyelitis to the dorsum of the fourth toe. Right foot pain and swelling for 6 months and open wound on the right fourth toe. EXAM: RIGHT FOOT COMPLETE - 3+ VIEW COMPARISON:  06/21/2023 FINDINGS: No acute fracture or dislocation. No radiographic evidence of osteomyelitis. Swelling about the fourth toe. IMPRESSION: No radiographic evidence of osteomyelitis. Swelling about the fourth toe. Electronically Signed   By: Minerva Fester M.D.   On: 10/26/2023 01:00    Pending Labs Unresulted Labs (From admission, onward)     Start     Ordered   10/26/23 0554  Hemoglobin A1c  Once,   R       Comments: To assess prior glycemic control    10/26/23 0553            Vitals/Pain Today's Vitals   10/26/23 0020 10/26/23 0415 10/26/23 0452 10/26/23 0739  BP:  (!) 152/77  (!) 144/66  Pulse:  78  67  Resp:  16  16  Temp:  97.8 F (36.6 C)     TempSrc:  Oral    SpO2:  98%  97%  Weight:      Height:      PainSc: 10-Worst pain ever  6      Isolation Precautions No active isolations  Medications Medications  cefTRIAXone (ROCEPHIN) 2 g in sodium chloride 0.9 % 100 mL IVPB (0 g Intravenous Stopped 10/26/23 0602)    And  metroNIDAZOLE (FLAGYL) tablet 500 mg (has no administration in time range)  oxyCODONE (Oxy IR/ROXICODONE) immediate release tablet 5 mg (5 mg Oral Given 10/26/23 0452)  albuterol (PROVENTIL) (2.5 MG/3ML) 0.083% nebulizer solution 2.5 mg (has no administration in time range)  ondansetron (ZOFRAN) injection 4 mg (has no administration in time range)  insulin aspart (novoLOG) injection 0-9 Units (has no administration in time range)  insulin aspart (novoLOG) injection 0-5 Units (has no administration in time range)  aspirin EC tablet 81 mg (has no administration in time range)  atorvastatin (LIPITOR) tablet 80 mg (has no administration in time range)  carvedilol (COREG) tablet 6.25 mg (has no administration in time range)  ezetimibe (ZETIA) tablet 10 mg (has no administration in time range)  irbesartan (AVAPRO) tablet 150 mg (has no administration in time range)  spironolactone (ALDACTONE) tablet 25 mg (has no administration in time range)  sertraline (ZOLOFT) tablet 100 mg (has no administration in time range)  dapagliflozin propanediol (FARXIGA) tablet 10 mg (has no administration in time range)  pantoprazole (PROTONIX) EC tablet 40 mg (has no administration in time range)  ticagrelor (BRILINTA) tablet 90 mg (has no administration in time range)  clonazePAM (KLONOPIN) tablet 1 mg (has no administration in time range)  pregabalin (LYRICA) capsule 150 mg (has no administration in time range)  enoxaparin (LOVENOX) injection 40 mg (has no administration in time range)  acetaminophen (TYLENOL) tablet 650 mg (has no administration in time range)    Or  acetaminophen (TYLENOL) suppository 650 mg (has no  administration in time range)  traZODone (DESYREL) tablet 25 mg (has no administration in time range)  hydrALAZINE (APRESOLINE) injection 5 mg (has no administration in time range)  HYDROmorphone (DILAUDID) injection 0.5 mg (has no administration in time range)  Mobility walks     Focused Assessments N/A   R Recommendations: See Admitting Provider Note  Report given to:   Additional Notes: N/A

## 2023-10-26 NOTE — ED Notes (Signed)
Writer called floor to give report was advised by upstairs staff that purple man had been entered, however not showing up on writer's end

## 2023-10-26 NOTE — ED Notes (Signed)
Pt returned from MRI °

## 2023-10-26 NOTE — H&P (Signed)
History and Physical  Alice Bennett FAO:[id number] DOB: Jul 14, 1961 DOA: 10/26/2023  PCP: Erskine Emery, NP   Chief Complaint: Right foot pain  HPI: Alice Bennett is a 62 y.o. female with medical history significant for hypertension, type 2 diabetes, peripheral arterial disease status post bypass on ASA/Brilinta being admitted to the hospital with right lower extremity pain and swelling and concern for cellulitis.  Initially underwent right common iliac artery stenting in September 2023, then required mechanical thrombectomy and restenting of the right common iliac, followed by right common and external iliac artery stenting in June 2024 after the right iliac stent thrombosed a second time.  Since that time, she has had a known right fourth toe dorsal ulceration which has been slow to heal.  She has seen podiatry Dr. Ralene Cork as well as followed with vascular surgery and wound care clinic regarding this wound.  When she saw vascular surgery in October, she was placed on 2-week course of Bactrim.  Was last seen by podiatry in October, who debrided her wound but did not feel that any antibiotics are indicated.  She tells me that she has not been on any antibiotics since she took 2 weeks of Bactrim.  She denies any fevers, chills, nausea, but states that she has had increased generalized swelling, erythema and tenderness of the right foot.  Emergency department, lab work is unremarkable, not meeting SIRS criteria.  X-ray of the foot without evidence of osteomyelitis, MRI of the foot was just done and results are pending.  She was given pain medication, started on empiric IV antibiotics.  Review of Systems: Please see HPI for pertinent positives and negatives. A complete 10 system review of systems are otherwise negative.  Past Medical History:  Diagnosis Date   CAD (coronary artery disease)    07/10/17 PCI/DES to LAD, EF 35-40%   Diabetes mellitus without complication (HCC)    Hyperlipidemia     Hypertension    NSTEMI (non-ST elevated myocardial infarction) [facility])    Past Surgical History:  Procedure Laterality Date   ABDOMINAL AORTOGRAM W/LOWER EXTREMITY N/A 07/26/2022   Procedure: ABDOMINAL AORTOGRAM W/LOWER EXTREMITY;  Surgeon: Runell Gess, MD;  [facility];  Service: Cardiovascular;  Laterality: N/A;   ABDOMINAL AORTOGRAM W/LOWER EXTREMITY N/A 04/22/2023   Procedure: ABDOMINAL AORTOGRAM W/LOWER EXTREMITY;  Surgeon: Leonie Douglas, MD;  [facility];  Service: Cardiovascular;  Laterality: N/A;   AORTOGRAM Bilateral 02/05/2023   Procedure: AORTOGRAM WITH RUN OFF;  Surgeon: Nada Libman, MD;  [facility] OR;  Service: Vascular;  Laterality: Bilateral;   APPLICATION OF WOUND VAC Bilateral 05/04/2023   Procedure: APPLICATION OF WOUND VAC;  Surgeon: Nada Libman, MD;  [facility];  Service: Vascular;  Laterality: Bilateral;   CORONARY PRESSURE/FFR STUDY N/A 07/10/2017   Procedure: INTRAVASCULAR PRESSURE WIRE/FFR STUDY;  Surgeon: Swaziland, Peter M, MD;  [facility];  Service: Cardiovascular;  Laterality: N/A;   CORONARY PRESSURE/FFR STUDY N/A 05/31/2022   Procedure: INTRAVASCULAR PRESSURE WIRE/FFR STUDY;  Surgeon: Yvonne Kendall, MD;  [facility];  Service: Cardiovascular;  Laterality: N/A;   CORONARY STENT INTERVENTION N/A 07/10/2017   Procedure: CORONARY STENT INTERVENTION;  Surgeon: Swaziland, Peter M, MD;  Location: [facility] INVASIVE CV LAB;  Service: Cardiovascular;  Laterality: N/A;   CORONARY STENT INTERVENTION N/A 05/31/2022   Procedure: CORONARY STENT INTERVENTION;  Surgeon: Yvonne Kendall, MD;  [facility];  Service: Cardiovascular;  Laterality:  N/A;   FEMORAL-FEMORAL BYPASS GRAFT Bilateral 05/04/2023   Procedure: LEFT TO RIGHT FEMORAL-FEMORAL ARTERY BYPASS;  Surgeon: Nada Libman, MD;  [facility];  Service: Vascular;  Laterality: Bilateral;   INSERTION OF ILIAC STENT Right 02/05/2023    Procedure: INSERTION OF RIGHT ILIAC STENT;  Surgeon: Nada Libman, MD;  [facility];  Service: Vascular;  Laterality: Right;   LEFT HEART CATH AND CORONARY ANGIOGRAPHY N/A 07/10/2017   Procedure: LEFT HEART CATH AND CORONARY ANGIOGRAPHY;  Surgeon: Swaziland, Peter M, MD;  Location: [facility] INVASIVE CV LAB;  Service: Cardiovascular;  Laterality: N/A;   LOWER EXTREMITY ANGIOGRAM N/A 02/05/2023   Procedure: RIGHT LOWER EXTREMITY ANGIOGRAM;  Surgeon: Nada Libman, MD;  [facility];  Service: Vascular;  Laterality: N/A;   PERIPHERAL VASCULAR INTERVENTION  07/26/2022   Procedure: PERIPHERAL VASCULAR INTERVENTION;  Surgeon: Runell Gess, MD;  [facility];  Service: Cardiovascular;;   PERIPHERAL VASCULAR INTERVENTION  04/22/2023   Procedure: PERIPHERAL VASCULAR INTERVENTION;  Surgeon: Leonie Douglas, MD;  [facility];  Service: Cardiovascular;;  right common and ecternal iliac, right common femoral   RIGHT/LEFT HEART CATH AND CORONARY ANGIOGRAPHY N/A 05/31/2022   Procedure: RIGHT/LEFT HEART CATH AND CORONARY ANGIOGRAPHY;  Surgeon: Yvonne Kendall, MD;  [facility];  Service: Cardiovascular;  Laterality: N/A;   ULTRASOUND GUIDANCE FOR VASCULAR ACCESS Bilateral 02/05/2023   Procedure: ULTRASOUND GUIDANCE FOR VASCULAR ACCESS;  Surgeon: Nada Libman, MD;  [facility];  Service: Vascular;  Laterality: Bilateral;    Social History:  reports that she quit smoking about 6 months ago. Her smoking use included cigarettes. She has a 5 pack-year smoking history. She has never used smokeless tobacco. She reports that she does not drink alcohol and does not use drugs.   Allergies  Allergen Reactions   Codeine Itching    History reviewed. No pertinent family history.   Prior to Admission medications   Medication Sig Start Date End Date Taking? Authorizing Provider  aspirin EC 81 MG tablet Take 81 mg by mouth daily.   Yes [provider]  atorvastatin (LIPITOR) 80 MG tablet Take 1 tablet (80 mg total) by mouth daily at 6 PM. 07/19/17  Yes Lennette Bihari, MD  carvedilol (COREG) 6.25 MG tablet Take 1 tablet (6.25 mg total) by mouth 2 (two) times daily with a meal. 07/19/17  Yes Lennette Bihari, MD  Cholecalciferol (D3 HIGH POTENCY) 125 MCG (5000 UT) capsule Take 5,000 Units by mouth daily.   Yes [provider]  ezetimibe (ZETIA) 10 MG tablet TAKE 1 TABLET BY MOUTH EVERY DAY 09/13/23  Yes Lennette Bihari, MD  FARXIGA 10 MG TABS tablet Take 10 mg by mouth daily.  03/28/19  Yes [provider]  olmesartan (BENICAR) 20 MG tablet Take 1 tablet (20 mg total) by mouth daily. 03/09/21  Yes Lennette Bihari, MD  OZEMPIC, 2 MG/DOSE, 8 MG/3ML SOPN Inject 2 mg into the skin once a week. 02/01/23  Yes [provider]  pantoprazole (PROTONIX) 40 MG tablet Take 1 tablet (40 mg total) by mouth daily. 07/19/17  Yes Lennette Bihari, MD  pregabalin (LYRICA) 150 MG capsule Take 150 mg by mouth in the morning, at noon, and at bedtime.   Yes [provider]  sertraline (ZOLOFT) 100 MG tablet Take 100 mg by mouth daily.   Yes [provider]  spironolactone (ALDACTONE) 25 MG tablet Take 25 mg  by mouth daily.   Yes [provider]  ticagrelor (BRILINTA) 90 MG TABS tablet Take 1 tablet (90 mg total) by mouth 2 (two) times daily. 02/06/23  Yes Dorrell, Molly Maduro, MD  XARELTO 20 MG TABS tablet Take 20 mg by mouth daily. 05/25/23  Yes [provider]  clonazePAM (KLONOPIN) 1 MG tablet Take 1 mg by mouth 2 (two) times daily.    [provider]  doxycycline (VIBRAMYCIN) 100 MG capsule Take 100 mg by mouth 2 (two) times daily. Patient not taking: Reported on 10/26/2023 06/20/23   [provider]  ondansetron (ZOFRAN) 4 MG tablet Take 4 mg by mouth every 6 (six) hours. 07/13/23   [provider]  oxyCODONE-acetaminophen (PERCOCET/ROXICET) 5-325 MG tablet Take 1 tablet by mouth every 6  (six) hours as needed for severe pain. Patient not taking: Reported on 08/15/2023 06/27/23   Emilie Rutter, PA-C  VASCEPA 1 g capsule Take 1 capsule (1 g total) by mouth 2 (two) times daily. Patient not taking: Reported on 10/26/2023 10/21/22   Lennette Bihari, MD    Physical Exam: BP (!) 144/66   Pulse 67   Temp 97.8 F (36.6 C) (Oral)   Resp 16   Ht 5\' 9"  (1.753 m)   Wt 96.2 kg   SpO2 97%   BMI 31.31 kg/m   General:  Alert, oriented, calm, in no acute distress resting in the ER, looks comfortable and nontoxic. Cardiovascular: RRR, no murmurs or rubs, no peripheral edema  Respiratory: clear to auscultation bilaterally, no wheezes, no crackles  Abdomen: soft, nontender, nondistended, normal bowel tones heard  Skin: dry, no rashes  Musculoskeletal: no joint effusions, normal range of motion, the right foot is erythematous and swollen, there is a small dorsal ulceration on the right fourth toe, without evidence of drainage or centralized erythema Psychiatric: appropriate affect, normal speech  Neurologic: extraocular muscles intact, clear speech, moving all extremities with intact sensorium           Labs on Admission:  Basic Metabolic Panel: Recent Labs  Lab 10/26/23 0046  NA 136  K 4.1  CL 105  CO2 24  GLUCOSE 107*  BUN 14  CREATININE 0.93  CALCIUM 9.1   Liver Function Tests: No results for input(s): "AST", "ALT", "ALKPHOS", "BILITOT", "PROT", "ALBUMIN" in the last 168 hours. No results for input(s): "LIPASE", "AMYLASE" in the last 168 hours. No results for input(s): "AMMONIA" in the last 168 hours. CBC: Recent Labs  Lab 10/26/23 0046  WBC 9.3  HGB 13.6  HCT 42.1  MCV 85.6  PLT 209   Cardiac Enzymes: No results for input(s): "CKTOTAL", "CKMB", "CKMBINDEX", "TROPONINI" in the last 168 hours.  BNP (last 3 results) No results for input(s): "BNP" in the last 8760 hours.  ProBNP (last 3 results) No results for input(s): "PROBNP" in the last 8760  hours.  CBG: No results for input(s): "GLUCAP" in the last 168 hours.  Radiological Exams on Admission: DG Foot Complete Right Result Date: 10/26/2023 CLINICAL DATA:  Concern for osteomyelitis to the dorsum of the fourth toe. Right foot pain and swelling for 6 months and open wound on the right fourth toe. EXAM: RIGHT FOOT COMPLETE - 3+ VIEW COMPARISON:  06/21/2023 FINDINGS: No acute fracture or dislocation. No radiographic evidence of osteomyelitis. Swelling about the fourth toe. IMPRESSION: No radiographic evidence of osteomyelitis. Swelling about the fourth toe. Electronically Signed   By: Minerva Fester M.D.   On: 10/26/2023 01:00   Assessment/Plan Alice Sacks  Bennett is a 62 y.o. female with medical history significant for hypertension, type 2 diabetes, peripheral arterial disease status post bypass on ASA/Brilinta being admitted to the hospital with right lower extremity pain and swelling and concern for cellulitis and diabetic foot wound.  There is a possibility that her symptoms are related to her known peripheral arterial disease, however she has warm extremities, no symptoms of claudication and has dopplerable pulses so this is deemed less likely.  Diabetic foot wound-concern due to increasing erythema, and tenderness.  No leukocytosis, or fevers.  No indication of deeper infection though MRI is pending. -Inpatient admission -Continue empiric IV Rocephin and Flagyl -Pain control as needed -Discussed with Dr. Annamary Rummage of podiatry, will consult -Follow-up MRI foot  Type 2 diabetes-historically well-controlled, with last hemoglobin A1c 6.13 January 2023 -Check hemoglobin A1c -Carb controlled diet with sliding scale insulin -Farxiga  Peripheral arterial disease-continue aspirin and Brilinta  Hypertension-continue home Coreg, Aldactone, Avapro  Hyperlipidemia-continue Lipitor 80  Depression and anxiety-continue Klonopin and Zoloft  DVT prophylaxis: Lovenox     Code Status: Full  Code  Consults called: Podiatry Dr. Annamary Rummage  Admission status: The appropriate patient status for this patient is INPATIENT. Inpatient status is judged to be reasonable and necessary in order to provide the required intensity of service to ensure the patient's safety. The patient's presenting symptoms, physical exam findings, and initial radiographic and laboratory data in the context of their chronic comorbidities is felt to place them at high risk for further clinical deterioration. Furthermore, it is not anticipated that the patient will be medically stable for discharge from the hospital within 2 midnights of admission.    I certify that at the point of admission it is my clinical judgment that the patient will require inpatient hospital care spanning beyond 2 midnights from the point of admission due to high intensity of service, high risk for further deterioration and high frequency of surveillance required  Time spent: 59 minutes  Fannie Alomar Sharlette Dense MD [facility] Pager [phone]  If 7PM-7AM, please contact night-coverage [email or web address] Password [facility]  10/26/2023, 7:43 AM

## 2023-10-26 NOTE — ED Notes (Signed)
Pt gone to MRI 

## 2023-10-26 NOTE — Progress Notes (Addendum)
MRI with acute osteomyelitis   Discussed with Dr. Annamary Rummage  Add vancomycin and will make NPO after midnight tonight.   Discussed anticoagulation with pharmacy who verified it appears patient takes Aspirin, Brilinta and Xarelto.   Xarelto is not in vascular surgery documentation.  Will ask Vascular to consult regarding need for all three, as well as safety of holding Xarelto for surgery.   For now continue ASA and Brilinta and await vascular recommendations prior to potentially continuing Xarelto.   -----   Discussed with Dr. Lenell Antu, who states safe to hold Xarelto for now.  On further chart review, it appears Xarelto was discontinued by vascular surgery when she was discharged from the hospital in June.  Will plan to hold Xarelto.  Dr. Lenell Antu states anticoagulation can be reviewed at her upcoming outpatient vascular surgery appointment at the end of the month.

## 2023-10-26 NOTE — ED Notes (Signed)
Writer called floor again to let them know pt was being brought up. Purple man still not populated on writer's end

## 2023-10-26 NOTE — Progress Notes (Signed)
RLE venous duplex has been completed.   Results can be found under chart review under CV PROC. 10/26/2023 1:34 PM Deidrea Gaetz RVT, RDMS

## 2023-10-27 ENCOUNTER — Inpatient Hospital Stay (HOSPITAL_COMMUNITY): Payer: Medicaid Other | Admitting: Anesthesiology

## 2023-10-27 ENCOUNTER — Encounter (HOSPITAL_COMMUNITY)
Admission: EM | Disposition: A | Discharge status: Home / Self Care | Payer: Self-pay | Source: Home / Self Care | Attending: Internal Medicine

## 2023-10-27 ENCOUNTER — Encounter (HOSPITAL_COMMUNITY): Payer: Medicaid Other

## 2023-10-27 ENCOUNTER — Other Ambulatory Visit: Payer: Self-pay

## 2023-10-27 ENCOUNTER — Inpatient Hospital Stay (HOSPITAL_COMMUNITY): Payer: Self-pay | Admitting: Anesthesiology

## 2023-10-27 ENCOUNTER — Encounter (HOSPITAL_COMMUNITY): Payer: Self-pay | Admitting: Internal Medicine

## 2023-10-27 ENCOUNTER — Inpatient Hospital Stay (HOSPITAL_COMMUNITY): Payer: Medicaid Other

## 2023-10-27 DIAGNOSIS — Z87891 Personal history of nicotine dependence: Secondary | ICD-10-CM

## 2023-10-27 DIAGNOSIS — L03115 Cellulitis of right lower limb: Principal | ICD-10-CM

## 2023-10-27 DIAGNOSIS — I10 Essential (primary) hypertension: Secondary | ICD-10-CM | POA: Diagnosis not present

## 2023-10-27 DIAGNOSIS — E118 Type 2 diabetes mellitus with unspecified complications: Secondary | ICD-10-CM | POA: Diagnosis not present

## 2023-10-27 DIAGNOSIS — M86171 Other acute osteomyelitis, right ankle and foot: Secondary | ICD-10-CM | POA: Diagnosis not present

## 2023-10-27 DIAGNOSIS — M869 Osteomyelitis, unspecified: Secondary | ICD-10-CM | POA: Diagnosis not present

## 2023-10-27 DIAGNOSIS — E1169 Type 2 diabetes mellitus with other specified complication: Secondary | ICD-10-CM | POA: Diagnosis not present

## 2023-10-27 HISTORY — PX: AMPUTATION TOE: SHX6595

## 2023-10-27 LAB — GLUCOSE, CAPILLARY
Glucose-Capillary: 133 mg/dL — ABNORMAL HIGH (ref 70–99)
Glucose-Capillary: 132 mg/dL — ABNORMAL HIGH (ref 70–99)
Glucose-Capillary: 158 mg/dL — ABNORMAL HIGH (ref 70–99)

## 2023-10-27 LAB — CREATININE, SERUM
Creatinine, Ser: 1 mg/dL (ref 0.44–1.00)
GFR, Estimated: >60 mL/min (ref >60)

## 2023-10-27 SURGERY — AMPUTATION, TOE
Anesthesia: Monitor Anesthesia Care | Site: Toe | Laterality: Right

## 2023-10-27 MED ORDER — EPHEDRINE SULFATE-NACL 50-0.9 MG/10ML-% IV SOSY
PREFILLED_SYRINGE | INTRAVENOUS | Status: DC | PRN
  Administered 2023-10-27: 10 mg via INTRAVENOUS
  Administered 2023-10-27 (×2): 5 mg via INTRAVENOUS
  Administered 2023-10-27: 10 mg via INTRAVENOUS
  Administered 2023-10-27: 5 mg via INTRAVENOUS

## 2023-10-27 MED ORDER — OXYCODONE HCL 5 MG/5ML PO SOLN: 5.0000 mg | Freq: Once | ORAL | Status: DC | PRN

## 2023-10-27 MED ORDER — MIDAZOLAM HCL 2 MG/2ML IJ SOLN
INTRAMUSCULAR | Status: DC | PRN
  Administered 2023-10-27: 2 mg via INTRAVENOUS

## 2023-10-27 MED ORDER — DOXYCYCLINE HYCLATE 100 MG PO TABS: 100.0000 mg | ORAL_TABLET | Freq: Two times a day (BID) | ORAL | 0 refills | Status: AC

## 2023-10-27 MED ORDER — VANCOMYCIN HCL 1000 MG IV SOLR
INTRAVENOUS | Status: AC
  Filled 2023-10-27: qty 20

## 2023-10-27 MED ORDER — PROPOFOL 10 MG/ML IV BOLUS
INTRAVENOUS | Status: AC
  Filled 2023-10-27: qty 20

## 2023-10-27 MED ORDER — ONDANSETRON HCL 4 MG/2ML IJ SOLN
INTRAMUSCULAR | Status: DC | PRN
  Administered 2023-10-27: 4 mg via INTRAVENOUS

## 2023-10-27 MED ORDER — OXYCODONE HCL 5 MG PO TABS
5.0000 mg | ORAL_TABLET | Freq: Once | ORAL | Status: DC | PRN
Start: 2023-10-27 — End: 2023-10-27

## 2023-10-27 MED ORDER — PROPOFOL 500 MG/50ML IV EMUL
INTRAVENOUS | Status: DC | PRN
  Administered 2023-10-27: 100 ug/kg/min via INTRAVENOUS

## 2023-10-27 MED ORDER — FENTANYL CITRATE (PF) 100 MCG/2ML IJ SOLN
INTRAMUSCULAR | Status: AC
  Filled 2023-10-27: qty 2

## 2023-10-27 MED ORDER — BUPIVACAINE HCL (PF) 0.5 % IJ SOLN
INTRAMUSCULAR | Status: DC | PRN
  Administered 2023-10-27: 10 mL

## 2023-10-27 MED ORDER — AMOXICILLIN-POT CLAVULANATE 875-125 MG PO TABS: 1.0000 | ORAL_TABLET | Freq: Two times a day (BID) | ORAL | 0 refills | Status: AC

## 2023-10-27 MED ORDER — CHLORHEXIDINE GLUCONATE 0.12 % MT SOLN
15.0000 mL | Freq: Once | OROMUCOSAL | Status: AC
  Administered 2023-10-27: 15 mL via OROMUCOSAL
  Filled 2023-10-27: qty 15

## 2023-10-27 MED ORDER — MIDAZOLAM HCL 2 MG/2ML IJ SOLN
INTRAMUSCULAR | Status: AC
  Filled 2023-10-27: qty 2

## 2023-10-27 MED ORDER — ACETAMINOPHEN 10 MG/ML IV SOLN: 1000.0000 mg | Freq: Once | INTRAVENOUS | Status: DC | PRN

## 2023-10-27 MED ORDER — ONDANSETRON HCL 4 MG/2ML IJ SOLN: 4.0000 mg | Freq: Once | INTRAMUSCULAR | Status: DC | PRN

## 2023-10-27 MED ORDER — DEXAMETHASONE SODIUM PHOSPHATE 10 MG/ML IJ SOLN
INTRAMUSCULAR | Status: AC
  Filled 2023-10-27: qty 1

## 2023-10-27 MED ORDER — RIVAROXABAN 10 MG PO TABS: 20.0000 mg | ORAL_TABLET | Freq: Every day | ORAL | Status: DC

## 2023-10-27 MED ORDER — BUPIVACAINE HCL (PF) 0.5 % IJ SOLN
INTRAMUSCULAR | Status: AC
  Filled 2023-10-27: qty 30

## 2023-10-27 MED ORDER — LIDOCAINE 2% (20 MG/ML) 5 ML SYRINGE
INTRAMUSCULAR | Status: DC | PRN
  Administered 2023-10-27: 40 mg via INTRAVENOUS

## 2023-10-27 MED ORDER — OXYCODONE-ACETAMINOPHEN 5-325 MG PO TABS: 1.0000 | ORAL_TABLET | Freq: Four times a day (QID) | ORAL | 0 refills | Status: DC | PRN

## 2023-10-27 MED ORDER — FENTANYL CITRATE PF 50 MCG/ML IJ SOSY: 25.0000 ug | PREFILLED_SYRINGE | INTRAMUSCULAR | Status: DC | PRN

## 2023-10-27 MED ORDER — LACTATED RINGERS IV SOLN: INTRAVENOUS | Status: DC

## 2023-10-27 MED ORDER — ONDANSETRON HCL 4 MG/2ML IJ SOLN
INTRAMUSCULAR | Status: AC
  Filled 2023-10-27: qty 2

## 2023-10-27 MED ORDER — INSULIN ASPART 100 UNIT/ML IJ SOLN: 0.0000 [IU] | INTRAMUSCULAR | Status: DC | PRN

## 2023-10-27 MED ORDER — FENTANYL CITRATE (PF) 100 MCG/2ML IJ SOLN
INTRAMUSCULAR | Status: DC | PRN
  Administered 2023-10-27: 50 ug via INTRAVENOUS

## 2023-10-27 SURGICAL SUPPLY — 34 items
BAG COUNTER SPONGE SURGICOUNT (BAG) IMPLANT
BLADE AVERAGE 25X9 (BLADE) IMPLANT
BLADE OSC/SAG .038X5.5 CUT EDG (BLADE) IMPLANT
BLADE SURG 15 STRL LF DISP TIS (BLADE) IMPLANT
BNDG ELASTIC 4INX 5YD STR LF (GAUZE/BANDAGES/DRESSINGS) IMPLANT
BNDG ELASTIC 6INX 5YD STR LF (GAUZE/BANDAGES/DRESSINGS) ×1 IMPLANT
BNDG GAUZE DERMACEA FLUFF 4 (GAUZE/BANDAGES/DRESSINGS) IMPLANT
BNDG STRETCH 4X75 STRL LF (GAUZE/BANDAGES/DRESSINGS) ×1 IMPLANT
CLEANER TIP ELECTROSURG 2X2 (MISCELLANEOUS) IMPLANT
CNTNR URN SCR LID CUP LEK RST (MISCELLANEOUS) IMPLANT
COVER SURGICAL LIGHT HANDLE (MISCELLANEOUS) ×1 IMPLANT
CUFF TOURN SGL QUICK 18 (TOURNIQUET CUFF) IMPLANT
GAUZE SPONGE 4X4 12PLY STRL (GAUZE/BANDAGES/DRESSINGS) ×1 IMPLANT
GAUZE XEROFORM 1X8 LF (GAUZE/BANDAGES/DRESSINGS) ×1 IMPLANT
GLOVE BIO SURGEON STRL SZ7.5 (GLOVE) ×1 IMPLANT
GLOVE BIOGEL PI IND STRL 8 (GLOVE) ×1 IMPLANT
GOWN SPEC L4 XLG W/TWL (GOWN DISPOSABLE) ×1 IMPLANT
KIT BASIN OR (CUSTOM PROCEDURE TRAY) ×1 IMPLANT
KIT TURNOVER KIT A (KITS) IMPLANT
NDL HYPO 25X1 1.5 SAFETY (NEEDLE) ×1 IMPLANT
NEEDLE HYPO 25X1 1.5 SAFETY (NEEDLE) ×1 IMPLANT
PACK ORTHO EXTREMITY (CUSTOM PROCEDURE TRAY) ×1 IMPLANT
PADDING UNDERCAST 2X4 STRL (CAST SUPPLIES) ×1 IMPLANT
PENCIL SMOKE EVACUATOR (MISCELLANEOUS) ×1 IMPLANT
SET HNDPC FAN SPRY TIP SCT (DISPOSABLE) IMPLANT
SPIKE FLUID TRANSFER (MISCELLANEOUS) IMPLANT
STAPLER SKIN PROX WIDE 3.9 (STAPLE) ×1 IMPLANT
SUT ETHILON 4 0 PS 2 18 (SUTURE) ×1 IMPLANT
SUT MNCRL AB 4-0 PS2 18 (SUTURE) ×1 IMPLANT
SUT PROLENE 4 0 PS 2 18 (SUTURE) IMPLANT
SYR 20ML LL LF (SYRINGE) IMPLANT
TOWEL OR 17X26 10 PK STRL BLUE (TOWEL DISPOSABLE) ×1 IMPLANT
UNDERPAD 30X36 HEAVY ABSORB (UNDERPADS AND DIAPERS) ×2 IMPLANT
YANKAUER SUCT BULB TIP 10FT TU (MISCELLANEOUS) ×1 IMPLANT

## 2023-10-27 NOTE — Anesthesia Preprocedure Evaluation (Signed)
Anesthesia Evaluation  Patient identified by MRN, date of birth, ID band Patient awake    Reviewed: Allergy & Precautions, NPO status , Patient's Chart, lab work & pertinent test results, reviewed documented beta blocker date and time   History of Anesthesia Complications Negative for: history of anesthetic complications  Airway Mallampati: III  TM Distance: >3 FB     Dental  (+) Edentulous Lower, Edentulous Upper   Pulmonary neg COPD, Patient abstained from smoking., former smoker   breath sounds clear to auscultation       Cardiovascular hypertension, + angina  + CAD, + Past MI and + Peripheral Vascular Disease   Rhythm:Regular Rate:Normal     Neuro/Psych neg Seizures    GI/Hepatic ,neg GERD  ,,(+) neg Cirrhosis        Endo/Other  diabetes, Poorly Controlled, Type 2    Renal/GU Renal disease     Musculoskeletal   Abdominal   Peds  Hematology   Anesthesia Other Findings   Reproductive/Obstetrics                             Anesthesia Physical Anesthesia Plan  ASA: 3  Anesthesia Plan: MAC   Post-op Pain Management:    Induction: Intravenous  PONV Risk Score and Plan: 2 and Ondansetron and Propofol infusion  Airway Management Planned: Natural Airway and Nasal Cannula  Additional Equipment:   Intra-op Plan:   Post-operative Plan:   Informed Consent: I have reviewed the patients History and Physical, chart, labs and discussed the procedure including the risks, benefits and alternatives for the proposed anesthesia with the patient or authorized representative who has indicated his/her understanding and acceptance.     Dental advisory given  Plan Discussed with: CRNA  Anesthesia Plan Comments:        Anesthesia Quick Evaluation

## 2023-10-27 NOTE — Discharge Summary (Signed)
Physician Discharge Summary   Patient: Alice Bennett MRN: [id number] DOB: 10-Mar-1961  Admit date:     10/26/2023  Discharge date: 10/27/23  Discharge Physician: Alba Cory   PCP: Erskine Emery, NP   Recommendations at discharge:   Needs to follow up with Dr Annamary Rummage for further care post op.  Follow culture results.   Discharge Diagnoses: Principal Problem:   Diabetic foot (HCC) Active Problems:   Acute osteomyelitis of toe of right foot (HCC)   Cellulitis of right lower extremity   Osteomyelitis of toe of right foot (HCC)  Resolved Problems:   * No resolved hospital problems. *  Hospital Course: Alice Bennett is a 62 y.o. female with medical history significant for hypertension, type 2 diabetes, peripheral arterial disease status post bypass on ASA/Brilinta being admitted to the hospital with right lower extremity pain and swelling and concern for cellulitis.  Initially underwent right common iliac artery stenting in September 2023, then required mechanical thrombectomy and restenting of the right common iliac, followed by right common and external iliac artery stenting in June 2024 after the right iliac stent thrombosed a second time.  Since that time, she has had a known right fourth toe dorsal ulceration which has been slow to heal.  She has seen podiatry Dr. Ralene Cork as well as followed with vascular surgery and wound care clinic regarding this wound.  When she saw vascular surgery in October, she was placed on 2-week course of Bactrim.  Was last seen by podiatry in October, who debrided her wound but did not feel that any antibiotics are indicated.  She tells me that she has not been on any antibiotics since she took 2 weeks of Bactrim.  She denies any fevers, chills, nausea, but states that she has had increased generalized swelling, erythema and tenderness of the right foot.  Emergency department, lab work is unremarkable, not meeting SIRS criteria.  X-ray of the foot  without evidence of osteomyelitis, MRI of the foot was just done and results are pending.  She was given pain medication, started on empiric IV antibiotics.   Assessment and Plan:  Alice Bennett is a 62 y.o. female with medical history significant for hypertension, type 2 diabetes, peripheral arterial disease status post bypass on ASA/Brilinta being admitted to the hospital with right lower extremity pain and swelling and concern for cellulitis and diabetic foot wound.  There is a possibility that her symptoms are related to her known peripheral arterial disease, however she has warm extremities, no symptoms of claudication and has dopplerable pulses so this is deemed less likely.   1-Diabetic foot wound-cellulitis foot and acute osteomyelitis middle phalanx distal half of the proximal phalanx of the fourth toe. -Follow-up MRI foot: Acute osteomyelitis in the middle phalanx and distal half of the proximal phalanx of the fourth toe. Abnormal edema signal laterally in the head of the fifth metatarsal suspicious for early osteomyelitis. Mild dorsal marrow edema in the third metatarsal head nonspecific for infection versus arthropathy.  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 Received IV antibiotics. Dr Annamary Rummage recommend discharge home with oral Augmentin and doxy for 1 week. Patient stable for discharge.   Type 2 diabetes-historically well-controlled, with last hemoglobin A1c 6.13 January 2023 -Resume Farxiga   Peripheral arterial disease-continue aspirin and Brilinta, and xarelto   Hypertension-continue home Coreg, Avapro. 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, right foot Disposition: Home Diet recommendation:  Discharge Diet Orders (From admission, onward)     Start      Ordered   10/27/23 0000  Diet - low sodium heart healthy        10/27/23 1356           Cardiac diet DISCHARGE MEDICATION: Allergies as of 10/27/2023       Reactions   Codeine Itching        Medication List     STOP taking these medications    doxycycline 100 MG capsule Commonly known as: VIBRAMYCIN Replaced by: doxycycline 100 MG tablet   ondansetron 4 MG tablet Commonly known as: ZOFRAN   spironolactone 25 MG tablet Commonly known as: ALDACTONE   Vascepa 1 g capsule Generic drug: icosapent Ethyl       TAKE these medications    amoxicillin-clavulanate 875-125 MG tablet Commonly known as: AUGMENTIN Take 1 tablet by mouth 2 (two) times daily for 7 days.   aspirin EC 81 MG tablet Take 81 mg by mouth daily.   atorvastatin 80 MG tablet Commonly known as: LIPITOR Take 1 tablet (80 mg total) by mouth daily at 6 PM.   carvedilol 6.25 MG tablet Commonly known as: COREG Take 1 tablet (6.25 mg total) by mouth 2 (two) times daily with a meal.   clonazePAM 1 MG tablet Commonly known as: KLONOPIN Take 1 mg by mouth 2 (two) times daily.   D3 High Potency 125 MCG (5000 UT) capsule Generic drug: Cholecalciferol Take 5,000 Units by mouth daily.   doxycycline 100 MG tablet Commonly known as: VIBRA-TABS Take 1 tablet (100 mg total) by mouth 2 (two) times daily for 7 days. Replaces: doxycycline 100 MG capsule   ezetimibe 10 MG tablet Commonly known as: ZETIA TAKE 1 TABLET BY MOUTH EVERY DAY   Farxiga 10 MG Tabs tablet Generic drug: dapagliflozin propanediol Take 10 mg by mouth daily.   olmesartan 20 MG tablet Commonly known as: BENICAR Take 1 tablet (20 mg total) by mouth daily.   oxyCODONE-acetaminophen 5-325 MG tablet Commonly known as: PERCOCET/ROXICET Take 1 tablet by mouth every 6 (six) hours as needed for severe pain (pain score 7-10).   Ozempic (2 MG/DOSE) 8 MG/3ML Sopn Generic drug: Semaglutide (2 MG/DOSE) Inject 2 mg into the skin once a  week.   pantoprazole 40 MG tablet Commonly known as: PROTONIX Take 1 tablet (40 mg total) by mouth daily.   pregabalin 150 MG capsule Commonly known as: LYRICA Take 150 mg by mouth in the morning, at noon, and at bedtime.   sertraline 100 MG tablet Commonly known as: ZOLOFT Take 100 mg by mouth daily.   ticagrelor 90 MG Tabs tablet Commonly known as: BRILINTA Take 1 tablet (90 mg total) by mouth 2 (two) times daily.   Xarelto 20 MG Tabs tablet Generic drug: rivaroxaban Take 20 mg by mouth daily.        Discharge Exam: Filed Weights   10/26/23 0004 10/27/23 0622  Weight: 96.2 kg 96.2 kg   General; NAD  Condition at discharge: stable  The results of significant diagnostics from this hospitalization (including imaging, microbiology, ancillary and laboratory) are listed below for reference.   Imaging Studies: DG Foot 2 Views Right Result Date: 10/27/2023 CLINICAL DATA:  Status post amputation. EXAM: RIGHT FOOT - 2 VIEW COMPARISON:  10/26/2023 radiographs and MRI. FINDINGS: Interval amputation of the right 4th toe. Associated  soft tissue irregularity and air. No fractures seen. The remaining bones have normal appearances. Continued distal soft tissue swelling. IMPRESSION: Interval amputation of the right 4th toe. Electronically Signed   By: Beckie Salts M.D.   On: 10/27/2023 12:57   VAS [place] LOWER EXTREMITY VENOUS (DVT) (7a-7p) Result Date: 10/26/2023  Lower Venous DVT Study Patient Name:  Alice Bennett  Date of Exam:   10/26/2023 Medical Rec #: [id number]        Accession #:    [id number] Date of Birth: 1960-11-27        Patient Gender: F Patient Age:   51 years Exam [facility] Procedure:      VAS [place] LOWER EXTREMITY VENOUS (DVT) Referring Phys: Delice Bison --------------------------------------------------------------------------------  Indications: DM foot ulcer.  Comparison Study: No previous venous exams Performing Technologist: Jody Hill RVT, RDMS   Examination Guidelines: A complete evaluation includes B-mode imaging, spectral Doppler, color Doppler, and power Doppler as needed of all accessible portions of each vessel. Bilateral testing is considered an integral part of a complete examination. Limited examinations for reoccurring indications may be performed as noted. The reflux portion of the exam is performed with the patient in reverse Trendelenburg.  +---------+---------------+---------+-----------+----------+--------------+ RIGHT    CompressibilityPhasicitySpontaneityPropertiesThrombus Aging +---------+---------------+---------+-----------+----------+--------------+ CFV      Full           Yes      Yes                                 +---------+---------------+---------+-----------+----------+--------------+ SFJ      Full                                                        +---------+---------------+---------+-----------+----------+--------------+ FV Prox  Full           Yes      Yes                                 +---------+---------------+---------+-----------+----------+--------------+ FV Mid   Full           Yes      Yes                                 +---------+---------------+---------+-----------+----------+--------------+ FV DistalFull           Yes      Yes                                 +---------+---------------+---------+-----------+----------+--------------+ PFV      Full                                                        +---------+---------------+---------+-----------+----------+--------------+ POP      Full           Yes      Yes                                 +---------+---------------+---------+-----------+----------+--------------+  PTV      Full                                                        +---------+---------------+---------+-----------+----------+--------------+ PERO     Full                                                         +---------+---------------+---------+-----------+----------+--------------+ Patient with PAD and history of surgical intervention. In setting of ulcer, distal ATA, PeroA, and PTA, DPA were doppler and all were patent.  +----+---------------+---------+-----------+----------+--------------+ LEFTCompressibilityPhasicitySpontaneityPropertiesThrombus Aging +----+---------------+---------+-----------+----------+--------------+ CFV Full           Yes      Yes                                 +----+---------------+---------+-----------+----------+--------------+     Summary: RIGHT: - There is no evidence of deep vein thrombosis in the lower extremity.  - No cystic structure found in the popliteal fossa.  LEFT: - No evidence of common femoral vein obstruction.   *See table(s) above for measurements and observations. Electronically signed by Heath Lark on 10/26/2023 at 4:07:09 PM.    Final    MR FOOT RIGHT WO CONTRAST Result Date: 10/26/2023 CLINICAL DATA:  Right foot pain and swelling for 6 months, wound on fourth toe EXAM: MRI OF THE RIGHT FOREFOOT WITHOUT CONTRAST TECHNIQUE: Multiplanar, multisequence MR imaging of the right forefoot was performed. No intravenous contrast was administered. COMPARISON:  10/26/2023 FINDINGS: Bones/Joint/Cartilage Abnormal edema compatible with acute osteomyelitis in the middle phalanx and distal half of the proximal phalanx of the fourth toe. Abnormal edema signal laterally in the head of the fifth metatarsal suspicious for early osteomyelitis. Mild dorsal marrow edema in the third metatarsal head nonspecific for infection versus arthropathy. Ligaments Lisfranc ligament intact. Muscles and Tendons Trace edema tracks along the common flexor digitorum longus tendon for example on image 1 series 4. Soft tissues Dorsal subcutaneous edema in the forefoot, cellulitis not excluded. This extends into the toes. The patient has a reported open wound dorsally on the fourth toe.  IMPRESSION: 1. Acute osteomyelitis in the middle phalanx and distal half of the proximal phalanx of the fourth toe. 2. Abnormal edema signal laterally in the head of the fifth metatarsal suspicious for early osteomyelitis. 3. Mild dorsal marrow edema in the third metatarsal head nonspecific for infection versus arthropathy. 4. Dorsal subcutaneous edema in the forefoot, cellulitis not excluded. This extends into the toes. Electronically Signed   By: Gaylyn Rong M.D.   On: 10/26/2023 08:57   DG Foot Complete Right Result Date: 10/26/2023 CLINICAL DATA:  Concern for osteomyelitis to the dorsum of the fourth toe. Right foot pain and swelling for 6 months and open wound on the right fourth toe. EXAM: RIGHT FOOT COMPLETE - 3+ VIEW COMPARISON:  06/21/2023 FINDINGS: No acute fracture or dislocation. No radiographic evidence of osteomyelitis. Swelling about the fourth toe. IMPRESSION: No radiographic evidence of osteomyelitis. Swelling about the fourth toe. Electronically Signed   By: Minerva Fester M.D.   On: 10/26/2023 01:00    Microbiology: Results  for orders placed or performed during the hospital encounter of 04/29/23  Surgical pcr screen     Status: None   Collection Time: 05/03/23  9:10 PM   Specimen: Nasal Mucosa; Nasal Swab  Result Value Ref Range Status   MRSA, PCR NEGATIVE NEGATIVE Final   Staphylococcus aureus NEGATIVE NEGATIVE Final    Comment: (NOTE) The Xpert SA Assay (FDA approved for NASAL specimens in patients 85 years of age and older), is one component of a comprehensive surveillance program. It is not intended to diagnose infection nor to guide or monitor treatment. Performed at [facility], [address] [place], [place] [address]     Labs: CBC: Recent Labs  Lab 10/26/23 0046  WBC 9.3  HGB 13.6  HCT 42.1  MCV 85.6  PLT 209   Basic Metabolic Panel: Recent Labs  Lab 10/26/23 0046 10/27/23 0536  NA 136  --   K 4.1  --   CL 105  --   CO2 24  --    GLUCOSE 107*  --   BUN 14  --   CREATININE 0.93 1.00  CALCIUM 9.1  --    Liver Function Tests: No results for input(s): "AST", "ALT", "ALKPHOS", "BILITOT", "PROT", "ALBUMIN" in the last 168 hours. CBG: Recent Labs  Lab 10/26/23 1645 10/26/23 2144 10/27/23 0555 10/27/23 0753 10/27/23 1115  GLUCAP 133* 134* 133* 132* 158*    Discharge time spent: greater than 30 minutes.  Signed: Alba Cory, MD [facility] 10/27/2023

## 2023-10-27 NOTE — Anesthesia Procedure Notes (Signed)
Procedure Name: MAC Date/Time: 10/27/2023 7:17 AM  Performed by: Sindy Guadeloupe, CRNAPre-anesthesia Checklist: Patient identified, Emergency Drugs available, Suction available, Patient being monitored and Timeout performed Oxygen Delivery Method: Simple face mask Placement Confirmation: positive ETCO2

## 2023-10-27 NOTE — Progress Notes (Signed)
Patient discharged home, IV removed, discharge paperwork provided and explained, patient verbalized understanding, patient provided with foot boot prior to discharge.

## 2023-10-27 NOTE — Transfer of Care (Signed)
Immediate Anesthesia Transfer of Care Note  Patient: Alice Bennett  Procedure(s) Performed: AMPUTATION TOE (Right: Toe)  Patient Location: PACU  Anesthesia Type:MAC  Level of Consciousness: awake, alert , and patient cooperative  Airway & Oxygen Therapy: Patient Spontanous Breathing and Patient connected to face mask oxygen  Post-op Assessment: Report given to RN and Post -op Vital signs reviewed and stable  Post vital signs: Reviewed and stable  Last Vitals:  Vitals Value Taken Time  BP 112/57 10/27/23 0753  Temp    Pulse 69 10/27/23 0753  Resp 39 10/27/23 0753  SpO2 100 % 10/27/23 0753  Vitals shown include unfiled device data.  Last Pain:  Vitals:   10/27/23 0622  TempSrc:   PainSc: 7       Patients Stated Pain Goal: 5 (10/27/23 0622)  Complications: No notable events documented.

## 2023-10-27 NOTE — Op Note (Signed)
Full Operative Report  Date of Operation: 7:53 AM, 10/27/2023   Patient: Alice Bennett - 62 y.o. female  Surgeon: [facility], DPM   Assistant: None  Diagnosis: Osteomyelitis of right 4th toe  Procedure:  1. 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  CRNA: Sindy Guadeloupe, CRNA   Estimated Blood Loss: Minimal   Hemostasis: 1) Anatomical dissection, mechanical compression, electrocautery 2) No tourniquet was used  Implants: * No implants in log *  Materials: Prolene suture  Injectables: 1) Pre-operatively: 10 cc   0.5% marcaine plain 2) Post-operatively: None   Specimens: Pathology: Right 4th toe  Microbiology: bone for culture 4th toe   Antibiotics: IV antibiotics given per schedule on the floor  Drains: None  Complications: Patient tolerated the procedure well without complication.   Operative findings: As below in detailed report  Indications for Procedure: Alice Bennett presents to [facility], [place] with a chief complaint of redness swelling and wound right 4th toe, concern for osteomyelitis of the 4th toe on MRI. The patient has failed conservative treatments of various modalities. At this time the patient has elected to proceed with surgical correction. All alternatives, risks, and complications of the procedures were thoroughly explained to the patient. Patient exhibits appropriate understanding of all discussion points and informed consent was signed and obtained in the chart with no guarantees to surgical outcome given or implied.  Description of Procedure: Patient was brought to the operating room. Patient remained on their hospital bed in the supine position. A surgical timeout was performed and all members of the operating room, the procedure, and the surgical site were identified. anesthesia occurred as per anesthesia record.  Local anesthetic as previously described was then injected about the operative field in a local infiltrative block.  The operative lower extremity as noted above was then prepped and draped in the usual sterile manner. The following procedure then began.  Attention was directed to the 4th digit on the right foot. A full-thickness incision encompassing the entire digit was made using a #15 blade. Dissection was carried down to bone. The toe was secured with a towel clamp, further dissected in its entirety, and disarticulated at the MPJ and passed to the back table as a gross specimen. This was then labled and sent to pathology. The bone was noted to be soft and eroded, and consistent with osteomyelitis. A bone culture was taken from the toe with rongeur and sent to micro. All remaining necrotic and devitalized soft tissue structures were visualized and dissected away using sharp and dull dissection. Care was taken to protect all neurovascular structures throughout the dissection. All bleeders were cauterized as necessary. A deep tissue culture was obtained at this time. The area was then flushed with copious amounts of sterile saline. Then using the suture materials previously described, the site was closed in anatomic layers and the skin was well approximated under minimal tension.  The surgical site was then dressed with xerform 4x4 kerlix ace. The patient tolerated both the procedure and anesthesia well with vital signs stable throughout. The patient was transferred in good condition and all vital signs stable  from the OR to recovery under the discretion of anesthesia.  Condition: Vital signs stable, neurovascular status unchanged from preoperative   Surgical plan:  Clean margins, no residual infection seen. WBAT in post op shoe. OK for DC home  later today from my perspective. F/u in office 1 week. Doxy/augmentin x 7 days.   The patient will be WBAT in a post op shoe to the operative limb until further  instructed. The dressing is to remain clean, dry, and intact. Will continue to follow unless noted elsewhere.   Alice Bennett, DPM [facility]

## 2023-10-27 NOTE — Anesthesia Postprocedure Evaluation (Signed)
Anesthesia Post Note  Patient: Alice Bennett  Procedure(s) Performed: AMPUTATION TOE (Right: Toe)     Patient location during evaluation: PACU Anesthesia Type: MAC Level of consciousness: awake and alert Pain management: pain level controlled Vital Signs Assessment: post-procedure vital signs reviewed and stable Respiratory status: spontaneous breathing, nonlabored ventilation, respiratory function stable and patient connected to nasal cannula oxygen Cardiovascular status: stable and blood pressure returned to baseline Postop Assessment: no apparent nausea or vomiting Anesthetic complications: no   No notable events documented.  Last Vitals:  Vitals:   10/27/23 1112 10/27/23 1237  BP: 103/60 (!) 106/44  Pulse: 68 64  Resp: 18 16  Temp: (!) 36.4 C 36.6 C  SpO2: 97% 98%    Last Pain:  Vitals:   10/27/23 1112  TempSrc: Oral  PainSc:                  Mariann Barter

## 2023-10-27 NOTE — Consult Note (Signed)
PODIATRY CONSULTATION  NAME Alice Bennett MRN [id number] DOB December 02, 1960 DOA 10/26/2023   Reason for consult:  Chief Complaint  Patient presents with   Foot Pain    Attending/Consulting physician: Sunnie Nielsen MD  History of present illness: "62 y.o. female with medical history significant for hypertension, type 2 diabetes, peripheral arterial disease status post bypass on ASA/Brilinta being admitted to the hospital with right lower extremity pain and swelling and concern for cellulitis.  Initially underwent right common iliac artery stenting in September 2023, then required mechanical thrombectomy and restenting of the right common iliac, followed by right common and external iliac artery stenting in June 2024 after the right iliac stent thrombosed a second time.  Since that time, she has had a known right fourth toe dorsal ulceration which has been slow to heal.  She has seen podiatry Dr. Ralene Cork as well as followed with vascular surgery and wound care clinic regarding this wound.  When she saw vascular surgery in October, she was placed on 2-week course of Bactrim.  Was last seen by podiatry in October, who debrided her wound but did not feel that any antibiotics are indicated.  She tells me that she has not been on any antibiotics since she took 2 weeks of Bactrim.  She denies any fevers, chills, nausea, but states that she has had increased generalized swelling, erythema and tenderness of the right foot.  Emergency department, lab work is unremarkable, not meeting SIRS criteria.  X-ray of the foot without evidence of osteomyelitis, MRI of the foot was just done and results are pending.  She was given pain medication, started on empiric IV antibiotics."  Discussed MRI findings again and plan for amputation. All questions were answered. She agrees to proceed consent signed.    Past Medical History:  Diagnosis Date   CAD (coronary artery disease)    07/10/17 PCI/DES to LAD, EF 35-40%   Diabetes  mellitus without complication (HCC)    Hyperlipidemia    Hypertension    NSTEMI (non-ST elevated myocardial infarction) (HCC)        Latest Ref Rng & Units 10/26/2023   12:46 AM 06/21/2023    1:07 PM 05/05/2023    1:39 AM  CBC  WBC 4.0 - 10.5 K/uL 9.3  6.7  5.7   Hemoglobin 12.0 - 15.0 g/dL 52.8  41.3  8.2   Hematocrit 36.0 - 46.0 % 42.1  38.4  25.8   Platelets 150 - 400 K/uL 209  221  171        Latest Ref Rng & Units 10/27/2023    5:36 AM 10/26/2023   12:46 AM 06/21/2023    1:07 PM  BMP  Glucose 70 - 99 mg/dL  244  010   BUN 8 - 23 mg/dL  14  11   Creatinine 2.72 - 1.00 mg/dL 5.36  6.44  0.34   Sodium 135 - 145 mmol/L  136  139   Potassium 3.5 - 5.1 mmol/L  4.1  3.9   Chloride 98 - 111 mmol/L  105  106   CO2 22 - 32 mmol/L  24  26   Calcium 8.9 - 10.3 mg/dL  9.1  8.7       Physical Exam: Lower Extremity Exam Vasc: R - PT non palpable, DP non palpable. Cap refill < 3 sec to digits  L - PT non palpable, DP non palpable. Cap refill <3 sec to digits  Derm: R - Ulcer dorsal aspect 4th toe with  fibrotic tissue. Edema and erythema of toe  L - Normal temp/texture/turgor with no open lesion or clinical signs of infection    MSK:  R - edema 4th toe   L -  No gross deformities. Compartments soft, non-tender, compressible  Neuro: R - Gross sensation absent to toe. Gross motor function intact   L - Gross sensation absent to toe. Gross motor function intact    ASSESSMENT/PLAN OF CARE 62 y.o. female with PMHx significant for  hypertension, type 2 diabetes, peripheral arterial disease status post bypass on ASA/Brilinta  with osteomyelitis of the right 4th toe.  MRI R Foot WO contrast 12/18 -Acute OM middle phalanx and proximal phalanx 4th toe - Marrow edema lateral head 5th met suspicious for early OM  - NPO for OR this AM for Right foot 4th toe amputation at MPJ level. No clinical concern for OM in 5th met head, likely stress reactive edema on MRI.  - Will assess bleeding  intra op. If very poor would consider vascular consult. She is s/p prior RLE bypass earlier this yr, suspect she is optimized for healing.  - Continue IV abx broad spectrum pending further culture data - Anticoagulation: Continue home anticoag do not hold - Wound care: Leave surgical dressing C/D/I post op - WB status: WBAT to R foot in post op shoe  - Will continue to follow   Thank you for the consult.  Please contact me directly with any questions or concerns.           Corinna Gab, DPM [facility] / [facility]    [address] [place], [place] [address]                Office [phone]  Fax [phone]

## 2023-10-27 NOTE — Progress Notes (Signed)
Orthopedic Tech Progress Note Patient Details:  Alice Bennett 02/25/1961 [id number]  Ortho Devices Type of Ortho Device: Postop shoe/boot Ortho Device/Splint Location: right Ortho Device/Splint Interventions: Ordered, Application, Adjustment   Post Interventions Patient Tolerated: Well Instructions Provided: Adjustment of device, Care of device  Kizzie Fantasia 10/27/2023, 9:14 AM

## 2023-10-28 ENCOUNTER — Encounter (HOSPITAL_COMMUNITY): Payer: Self-pay | Admitting: Podiatry

## 2023-10-28 LAB — SURGICAL PATHOLOGY

## 2023-11-01 LAB — AEROBIC/ANAEROBIC CULTURE W GRAM STAIN (SURGICAL/DEEP WOUND)

## 2023-11-07 ENCOUNTER — Ambulatory Visit (HOSPITAL_COMMUNITY): Payer: Medicaid Other

## 2023-11-07 ENCOUNTER — Ambulatory Visit: Payer: Medicaid Other

## 2023-11-15 ENCOUNTER — Ambulatory Visit (INDEPENDENT_AMBULATORY_CARE_PROVIDER_SITE_OTHER): Payer: Medicaid Other | Admitting: Podiatry

## 2023-11-15 ENCOUNTER — Encounter: Payer: Self-pay | Admitting: *Deleted

## 2023-11-15 DIAGNOSIS — Z89421 Acquired absence of other right toe(s): Secondary | ICD-10-CM

## 2023-11-15 DIAGNOSIS — Z006 Encounter for examination for normal comparison and control in clinical research program: Secondary | ICD-10-CM

## 2023-11-15 NOTE — Progress Notes (Signed)
  Subjective:  Patient ID: Alice Bennett, female    DOB: Nov 27, 1960,  MRN: [id number]  Chief Complaint  Patient presents with   Routine Post Op    Right 4th toe amputation 10/27/2023    DOS: 10/27/2023 Procedure: Right fourth toe amputation MPJ level  63 y.o. female seen for post op check.  Patient seen for postop check approximately 3.5 weeks status post above procedure.  She reports this is her first postop check.  Had had the dressing on for about 2 to 3 weeks and then removed to clean up some Betadine but did not wash the surgical site.  Denies any drainage redness or swelling.  Has some pain but attributes this mostly to neuropathy  Review of Systems: Negative except as noted in the HPI. Denies N/V/F/Ch.   Objective:  There were no vitals filed for this visit. There is no height or weight on file to calculate BMI. Constitutional Well developed. Well nourished.  Vascular Foot warm and well perfused. Capillary refill normal to all digits.   No calf pain with palpation  Neurologic Normal speech. Oriented to person, place, and time. Epicritic sensation diminished significantly to the forefoot level  Dermatologic Amputation site healing well nearly fully healed at this time with some eschar overlying the amputation site no redness swelling or drainage   Orthopedic: Status post right fourth toe amputation at MPJ level    Radiographs: Deferred immediate postop films with resection of fourth toe right foot  Pathology: Acute osteomyelitis with proximal margin negative for inflammation  Micro: Staff aureus and L adecarboxylata   Assessment:  No diagnosis found. Plan:  Patient was evaluated and treated and all questions answered.  PO week 3 s/p right fourth toe amputation MPJ level -Progressing well nearly fully healed. -XR: Deferred -WB Status: Weightbearing as tolerated in the shoe -Sutures: Removed in total at this visit. -Medications/ABX: Status post course of oral  antibiotics no indication for further antibiotics -Foot redressed with antibiotic ointment and Band-Aid continue daily to every other day until amputation site fully healed - Return in 4 weeks for final postop check        Alice Bennett, DPM [facility] / [facility]

## 2023-11-17 NOTE — Research (Signed)
 WEEK 72 Follow-Up Visit Completed*   []  Not Necessary, No Potential Adverse Events Or Medication Issues Reported On Completed Subject Questionnaire   []  Yes, Contact With Subject/Alternate Contact Completed   [x]  Yes, No Contact With Subject/Alternate Contact Completed, But Electronic Health Record Was Reviewed   []  No, Unable To Contact Subject/Alternate Contact   Have you reviewed Ongoing medications on the Targeted Concomitant Medication form and updated the form as needed?   [x]  Yes   []  No   Subject Status*   [x]  Continuing In Follow-up   []  At Risk For Lost To Follow-up   []  Withdrawal From All Future Study Activities Including Passive Follow-up By Electronic Health Record Review Or Contact With Healthcare Provider Or Family Member/Friend   []  Death   Vital Status*   [x]  Alive   []  Deceased   []  Unknown   Last Known To Be Alive Source*   []  Subject Completed Follow-up Questionnaire/Seen In Person/Via Telephone Contact   []  Family Member or Caretaker   [x]  Primary Physician Or Medical Records   []  Publicly Available Source   []  Other

## 2023-11-28 ENCOUNTER — Encounter (HOSPITAL_BASED_OUTPATIENT_CLINIC_OR_DEPARTMENT_OTHER): Payer: Medicaid Other | Admitting: Internal Medicine

## 2023-11-28 ENCOUNTER — Other Ambulatory Visit: Payer: Self-pay | Admitting: Cardiovascular Disease

## 2023-12-29 ENCOUNTER — Other Ambulatory Visit: Payer: Self-pay

## 2023-12-29 MED ORDER — TICAGRELOR 90 MG PO TABS: 90.0000 mg | ORAL_TABLET | Freq: Two times a day (BID) | ORAL | 2 refills | Status: DC

## 2023-12-30 ENCOUNTER — Other Ambulatory Visit: Payer: Self-pay

## 2023-12-30 ENCOUNTER — Other Ambulatory Visit: Payer: Self-pay | Admitting: Physician Assistant

## 2023-12-30 MED ORDER — TICAGRELOR 90 MG PO TABS: 90.0000 mg | ORAL_TABLET | Freq: Two times a day (BID) | ORAL | 2 refills | Status: DC

## 2024-01-09 ENCOUNTER — Ambulatory Visit (INDEPENDENT_AMBULATORY_CARE_PROVIDER_SITE_OTHER)
Admission: RE | Admit: 2024-01-09 | Discharge: 2024-01-09 | Disposition: A | Discharge status: Home / Self Care | Payer: Medicaid Other | Source: Ambulatory Visit | Attending: Surgery | Admitting: Surgery

## 2024-01-09 ENCOUNTER — Encounter: Payer: Self-pay | Admitting: *Deleted

## 2024-01-09 ENCOUNTER — Ambulatory Visit (HOSPITAL_COMMUNITY)
Admission: RE | Admit: 2024-01-09 | Discharge: 2024-01-09 | Disposition: A | Discharge status: Home / Self Care | Payer: Medicaid Other | Source: Ambulatory Visit | Attending: Surgery | Admitting: Surgery

## 2024-01-09 ENCOUNTER — Ambulatory Visit (INDEPENDENT_AMBULATORY_CARE_PROVIDER_SITE_OTHER): Payer: Medicaid Other | Admitting: Physician Assistant

## 2024-01-09 VITALS — BP 141/80 | HR 68 | Temp 97.2°F | Resp 18 | Ht 69.0 in | Wt 215.0 lb

## 2024-01-09 DIAGNOSIS — I70213 Atherosclerosis of native arteries of extremities with intermittent claudication, bilateral legs: Secondary | ICD-10-CM | POA: Diagnosis present

## 2024-01-09 DIAGNOSIS — I739 Peripheral vascular disease, unspecified: Secondary | ICD-10-CM

## 2024-01-09 DIAGNOSIS — Z006 Encounter for examination for normal comparison and control in clinical research program: Secondary | ICD-10-CM

## 2024-01-09 LAB — VAS US ABI WITH/WO TBI
Left ABI: 0.95
Right ABI: 0.89

## 2024-01-09 NOTE — Progress Notes (Signed)
 Office Note     CC:  follow up Requesting Provider:  Erskine Emery, NP  HPI: Alice Bennett is a 63 y.o. (14-Sep-1961) female who presents for surveillance of PAD.  She initially underwent right common iliac artery stenting by Dr. Gery Pray in September 2023.  She then required mechanical thrombectomy and restenting of the right common iliac artery by Dr. Myra Gianotti in March 2024.  Unfortunately the right iliac stent thrombosed and she underwent right common and external iliac artery stenting by Dr. Lenell Antu on 04/22/2023.  Iliac stents subsequently thrombosed and she underwent left to right femoral to femoral bypass with right femoral endarterectomy by Dr. Myra Gianotti on 05/04/2023.  Patient was doing well postoperatively however continued to have pain in the right fourth toe with slow to heal ulceration.  She was referred to podiatry who has since amputated the right fourth toe.  She states the toe has healed.  She denies claudication symptoms.  She also denies any rest pain or further tissue loss.  She is on Xarelto, Brilinta, aspirin, statin daily.   Past Medical History:  Diagnosis Date   CAD (coronary artery disease)    07/10/17 PCI/DES to LAD, EF 35-40%   Diabetes mellitus without complication (HCC)    Hyperlipidemia    Hypertension    NSTEMI (non-ST elevated myocardial infarction) [facility])     Past Surgical History:  Procedure Laterality Date   ABDOMINAL AORTOGRAM W/LOWER EXTREMITY N/A 07/26/2022   Procedure: ABDOMINAL AORTOGRAM W/LOWER EXTREMITY;  Surgeon: Runell Gess, MD;  Location: [facility] INVASIVE CV LAB;  Service: Cardiovascular;  Laterality: N/A;   ABDOMINAL AORTOGRAM W/LOWER EXTREMITY N/A 04/22/2023   Procedure: ABDOMINAL AORTOGRAM W/LOWER EXTREMITY;  Surgeon: Leonie Douglas, MD;  Location: [facility] INV[facility] CV [facility];  Service: Cardiovascular;  Laterality: N/A;   AMPUTATION TOE Right 10/27/2023   Procedure: AMPUTATION TOE;  Surgeon: Pilar Plate, DPM;  Location: [facility] ORS;  Service:  Orthopedics/Podiatry;  Laterality: Right;  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   AORTOGRAM Bilateral 02/05/2023   Procedure: AORTOGRAM WITH RUN OFF;  Surgeon: Nada Libman, MD;  Location: [facility] OR;  Service: Vascular;  Laterality: Bilateral;   APPLICATION OF WOUND VAC Bilateral 05/04/2023   Procedure: APPLICATION OF WOUND VAC;  Surgeon: Nada Libman, MD;  Location: [facility] OR;  Service: Vascular;  Laterality: Bilateral;   CORONARY PRESSURE/FFR STUDY N/A 07/10/2017   Procedure: INTRAVASCULAR PRESSURE WIRE/FFR STUDY;  Surgeon: Swaziland, Peter M, MD;  Location: [facility];  Service: Cardiovascular;  Laterality: N/A;   CORONARY PRESSURE/FFR STUDY N/A 05/31/2022   Procedure: INTRAVASCULAR PRESSURE WIRE/FFR STUDY;  Surgeon: Yvonne Kendall, MD;  Location: [facility];  Service: Cardiovascular;  Laterality: N/A;   CORONARY STENT INTERVENTION N/A 07/10/2017   Procedure: CORONARY STENT INTERVENTION;  Surgeon: Swaziland, Peter M, MD;  Location: [facility] INVASIVE CV LAB;  Service: Cardiovascular;  Laterality: N/A;   CORONARY STENT INTERVENTION N/A 05/31/2022   Procedure: CORONARY STENT INTERVENTION;  Surgeon: Yvonne Kendall, MD;  Location: [facility];  Service: Cardiovascular;  Laterality: N/A;   FEMORAL-FEMORAL BYPASS GRAFT Bilateral 05/04/2023   Procedure: LEFT TO RIGHT FEMORAL-FEMORAL ARTERY BYPASS;  Surgeon: Nada Libman, MD;  Location: [facility] OR;  Service: Vascular;  Laterality: Bilateral;   INSERTION OF ILIAC STENT Right 02/05/2023   Procedure: INSERTION OF RIGHT ILIAC STENT;  Surgeon: Nada Libman, MD;  Location: [facility] OR;  Service: Vascular;  Laterality: Right;   LEFT HEART CATH AND CORONARY ANGIOGRAPHY N/A 07/10/2017  Procedure: LEFT HEART CATH AND CORONARY ANGIOGRAPHY;  Surgeon: Swaziland, Peter M, MD;  Location: [facility] INVASIVE CV LAB;  Service: Cardiovascular;  Laterality: N/A;   LOWER EXTREMITY ANGIOGRAM N/A 02/05/2023   Procedure: RIGHT LOWER EXTREMITY ANGIOGRAM;  Surgeon: Nada Libman, MD;   Location: [facility];  Service: Vascular;  Laterality: N/A;   PERIPHERAL VASCULAR INTERVENTION  07/26/2022   Procedure: PERIPHERAL VASCULAR INTERVENTION;  Surgeon: Runell Gess, MD;  Location: [facility];  Service: Cardiovascular;;   PERIPHERAL VASCULAR INTERVENTION  04/22/2023   Procedure: PERIPHERAL VASCULAR INTERVENTION;  Surgeon: Leonie Douglas, MD;  Location: [facility];  Service: Cardiovascular;;  right common and ecternal iliac, right common femoral   RIGHT/LEFT HEART CATH AND CORONARY ANGIOGRAPHY N/A 05/31/2022   Procedure: RIGHT/LEFT HEART CATH AND CORONARY ANGIOGRAPHY;  Surgeon: Yvonne Kendall, MD;  Location: [facility];  Service: Cardiovascular;  Laterality: N/A;   ULTRASOUND GUIDANCE FOR VASCULAR ACCESS Bilateral 02/05/2023   Procedure: ULTRASOUND GUIDANCE FOR VASCULAR ACCESS;  Surgeon: Nada Libman, MD;  Location: [facility] OR;  Service: Vascular;  Laterality: Bilateral;    Social History   Socioeconomic History   Marital status: Single    Spouse name: Not on file   Number of children: Not on file   Years of education: Not on file   Highest education level: Not on file  Occupational History   Not on file  Tobacco Use   Smoking status: Former    Current packs/day: 0.00    Average packs/day: 0.5 packs/day for 10.0 years (5.0 ttl pk-yrs)    Types: Cigarettes    Quit date: 04/2023    Years since quitting: 0.7    Passive exposure: Current   Smokeless tobacco: Never  Vaping Use   Vaping status: Never Used  Substance and Sexual Activity   Alcohol use: No   Drug use: No   Sexual activity: Not on file  Other Topics Concern   Not on file  Social History Narrative   Not on file   Social Drivers of Health   Financial Resource Strain: Not on file  Food Insecurity: Patient Declined (10/26/2023)   Hunger Vital Sign    Worried About Running Out of Food in the Last Year: Patient declined    Ran Out of Food in the Last Year: Patient declined   Transportation Needs: Patient Declined (10/26/2023)   PRAPARE - Administrator, Civil Service (Medical): Patient declined    Lack of Transportation (Non-Medical): Patient declined  Physical Activity: Not on file  Stress: Not on file  Social Connections: Not on file  Intimate Partner Violence: Patient Declined (10/26/2023)   Humiliation, Afraid, Rape, and Kick questionnaire    Fear of Current or Ex-Partner: Patient declined    Emotionally Abused: Patient declined    Physically Abused: Patient declined    Sexually Abused: Patient declined   History reviewed. No pertinent family history.  Current Outpatient Medications  Medication Sig Dispense Refill   aspirin EC 81 MG tablet Take 81 mg by mouth daily.     atorvastatin (LIPITOR) 80 MG tablet Take 1 tablet (80 mg total) by mouth daily at 6 PM. 30 tablet 0   carvedilol (COREG) 6.25 MG tablet Take 1 tablet (6.25 mg total) by mouth 2 (two) times daily with a meal. 60 tablet 0   Cholecalciferol (D3 HIGH POTENCY) 125 MCG (5000 UT) capsule Take 5,000 Units by mouth daily.     clonazePAM (KLONOPIN) 1 MG tablet  Take 1 mg by mouth 2 (two) times daily.     ezetimibe (ZETIA) 10 MG tablet TAKE 1 TABLET BY MOUTH EVERY DAY 30 tablet 1   FARXIGA 10 MG TABS tablet Take 10 mg by mouth daily.      olmesartan (BENICAR) 20 MG tablet Take 1 tablet (20 mg total) by mouth daily. 90 tablet 3   oxyCODONE-acetaminophen (PERCOCET/ROXICET) 5-325 MG tablet Take 1 tablet by mouth every 6 (six) hours as needed for severe pain (pain score 7-10). 20 tablet 0   OZEMPIC, 2 MG/DOSE, 8 MG/3ML SOPN Inject 2 mg into the skin once a week.     pantoprazole (PROTONIX) 40 MG tablet Take 1 tablet (40 mg total) by mouth daily. 30 tablet 0   pregabalin (LYRICA) 150 MG capsule Take 150 mg by mouth in the morning, at noon, and at bedtime.     sertraline (ZOLOFT) 100 MG tablet Take 100 mg by mouth daily.     ticagrelor (BRILINTA) 90 MG TABS tablet Take 1 tablet (90 mg total)  by mouth 2 (two) times daily. 60 tablet 2   XARELTO 20 MG TABS tablet Take 20 mg by mouth daily.     No current facility-administered medications for this visit.    Allergies  Allergen Reactions   Codeine Itching     REVIEW OF SYSTEMS:   [X]  denotes positive finding, [ ]  denotes negative finding Cardiac  Comments:  Chest pain or chest pressure:    Shortness of breath upon exertion:    Short of breath when lying flat:    Irregular heart rhythm:        Vascular    Pain in calf, thigh, or hip brought on by ambulation:    Pain in feet at night that wakes you up from your sleep:     Blood clot in your veins:    Leg swelling:         Pulmonary    Oxygen at home:    Productive cough:     Wheezing:         Neurologic    Sudden weakness in arms or legs:     Sudden numbness in arms or legs:     Sudden onset of difficulty speaking or slurred speech:    Temporary loss of vision in one eye:     Problems with dizziness:         Gastrointestinal    Blood in stool:     Vomited blood:         Genitourinary    Burning when urinating:     Blood in urine:        Psychiatric    Major depression:         Hematologic    Bleeding problems:    Problems with blood clotting too easily:        Skin    Rashes or ulcers:        Constitutional    Fever or chills:      PHYSICAL EXAMINATION:  Vitals:   01/09/24 1102  BP: (!) 141/80  Pulse: 68  Resp: 18  Temp: (!) 97.2 F (36.2 C)  TempSrc: Temporal  SpO2: 98%  Weight: 215 lb (97.5 kg)  Height: 5\' 9"  (1.753 m)    General:  WDWN in NAD; vital signs documented above Gait: Not observed HENT: WNL, normocephalic Pulmonary: normal non-labored breathing , without Rales, rhonchi,  wheezing Cardiac: regular HR Abdomen: soft, NT, no masses Skin: without rashes  Vascular Exam/Pulses: Palpable PT pulses Extremities: Right fourth toe amputation site healed; no other areas of ulceration or wounds Musculoskeletal: no muscle wasting  or atrophy  Neurologic: A&O X 3 Psychiatric:  The pt has Normal affect.   Non-Invasive Vascular Imaging:   Femorofemoral bypass widely patent  ABI/TBIToday's ABIToday's TBIPrevious ABIPrevious TBI  +-------+-----------+-----------+------------+------------+  Right 0.89       0.37       0.86        0.45          +-------+-----------+-----------+------------+------------+  Left  0.95       0.64       1.03        0.69     ASSESSMENT/PLAN:: 63 y.o. female here for follow up for surveillance of femoral to femoral bypass  Ms. Fadely denies any return of claudication or rest pain.  She also denies any new tissue loss.  Since last office visit she underwent right fourth toe amputation by podiatry and has healed the amputation site.  Unfortunately left iliac artery was not imaged today however her femoral to femoral bypass graft is widely patent and her ABIs are stable.  On exam she has palpable PT pulses.  I encouraged her to continue to walk for exercise.  She should continue her triple therapy with Xarelto, Brilinta, and aspirin daily.  She will also continue a daily statin.  We will bring her back in 6 months for left iliac duplex, femorofemoral bypass duplex, and ABIs.  She knows to call/return office sooner with any questions or concerns.   Emilie Rutter, PA-C [facility] [phone]  Clinic MD:   Myra Gianotti

## 2024-01-19 ENCOUNTER — Other Ambulatory Visit: Payer: Self-pay | Admitting: *Deleted

## 2024-01-19 DIAGNOSIS — I70213 Atherosclerosis of native arteries of extremities with intermittent claudication, bilateral legs: Secondary | ICD-10-CM

## 2024-01-19 DIAGNOSIS — I70221 Atherosclerosis of native arteries of extremities with rest pain, right leg: Secondary | ICD-10-CM

## 2024-01-19 DIAGNOSIS — I739 Peripheral vascular disease, unspecified: Secondary | ICD-10-CM

## 2024-02-28 NOTE — Research (Signed)
 84 week Follow-Up Visit Completed*   []  Not Necessary, No Potential Adverse Events Or Medication Issues Reported On Completed Subject Questionnaire   []  Yes, Contact With Subject/Alternate Contact Completed   [x]  Yes, No Contact With Subject/Alternate Contact Completed, But Electronic Health Record Was Reviewed   []  No, Unable To Contact Subject/Alternate Contact   Have you reviewed Ongoing medications on the Targeted Concomitant Medication form and updated the form as needed?   [x]  Yes   []  No   Subject Status*   [x]  Continuing In Follow-up   []  At Risk For Lost To Follow-up   []  Withdrawal From All Future Study Activities Including Passive Follow-up By Electronic Health Record Review Or Contact With Healthcare Provider Or Family Member/Friend   []  Death   Vital Status*   [x]  Alive   []  Deceased   []  Unknown   Last Known To Be Alive Source*   []  Subject Completed Follow-up Questionnaire/Seen In Person/Via Telephone Contact   []  Family Member or Caretaker   []  Primary Physician Or Medical Records   []  Publicly Available Source   []  Other  Current Outpatient Medications on File Prior to Visit  Medication Sig Dispense Refill   aspirin  EC 81 MG tablet Take 81 mg by mouth daily.     atorvastatin  (LIPITOR ) 80 MG tablet Take 1 tablet (80 mg total) by mouth daily at 6 PM. 30 tablet 0   carvedilol  (COREG ) 6.25 MG tablet Take 1 tablet (6.25 mg total) by mouth 2 (two) times daily with a meal. 60 tablet 0   Cholecalciferol  (D3 HIGH POTENCY) 125 MCG (5000 UT) capsule Take 5,000 Units by mouth daily.     clonazePAM  (KLONOPIN ) 1 MG tablet Take 1 mg by mouth 2 (two) times daily.     ezetimibe  (ZETIA ) 10 MG tablet TAKE 1 TABLET BY MOUTH EVERY DAY 30 tablet 1   FARXIGA  10 MG TABS tablet Take 10 mg by mouth daily.      olmesartan  (BENICAR ) 20 MG tablet Take 1 tablet (20 mg total) by mouth daily. 90 tablet 3   oxyCODONE -acetaminophen  (PERCOCET/ROXICET) 5-325 MG tablet Take 1  tablet by mouth every 6 (six) hours as needed for severe pain (pain score 7-10). 20 tablet 0   OZEMPIC, 2 MG/DOSE, 8 MG/3ML SOPN Inject 2 mg into the skin once a week.     pantoprazole  (PROTONIX ) 40 MG tablet Take 1 tablet (40 mg total) by mouth daily. 30 tablet 0   pregabalin  (LYRICA ) 150 MG capsule Take 150 mg by mouth in the morning, at noon, and at bedtime.     sertraline  (ZOLOFT ) 100 MG tablet Take 100 mg by mouth daily.     ticagrelor  (BRILINTA ) 90 MG TABS tablet Take 1 tablet (90 mg total) by mouth 2 (two) times daily. 60 tablet 2   XARELTO  20 MG TABS tablet Take 20 mg by mouth daily.     No current facility-administered medications on file prior to visit.

## 2024-03-07 ENCOUNTER — Other Ambulatory Visit: Payer: Self-pay | Admitting: Surgery

## 2024-03-15 ENCOUNTER — Encounter: Payer: Self-pay | Admitting: *Deleted

## 2024-03-15 DIAGNOSIS — Z006 Encounter for examination for normal comparison and control in clinical research program: Secondary | ICD-10-CM

## 2024-04-10 ENCOUNTER — Other Ambulatory Visit: Payer: Self-pay | Admitting: Family

## 2024-04-10 DIAGNOSIS — Z1231 Encounter for screening mammogram for malignant neoplasm of breast: Secondary | ICD-10-CM

## 2024-04-17 ENCOUNTER — Encounter

## 2024-05-08 NOTE — Research (Signed)
 Week 96Follow-Up Visit Completed*   []  Not Necessary, No Potential Adverse Events Or Medication Issues Reported On Completed Subject Questionnaire   []  Yes, Contact With Subject/Alternate Contact Completed   [x]  Yes, No Contact With Subject/Alternate Contact Completed, But Electronic Health Record Was Reviewed   []  No, Unable To Contact Subject/Alternate Contact   Have you reviewed Ongoing medications on the Targeted Concomitant Medication form and updated the form as needed?   [x]  Yes   []  No   Subject Status*   [x]  Continuing In Follow-up   []  At Risk For Lost To Follow-up   []  Withdrawal From All Future Study Activities Including Passive Follow-up By Electronic Health Record Review Or Contact With Healthcare Provider Or Family Member/Friend   []  Death   Vital Status*   [x]  Alive   []  Deceased   []  Unknown   Last Known To Be Alive Source*   []  Subject Completed Follow-up Questionnaire/Seen In Person/Via Telephone Contact   []  Family Member or Caretaker   [x]  Primary Physician Or Medical Records   []  Publicly Available Source   []  Other

## 2024-05-25 ENCOUNTER — Encounter

## 2024-06-19 ENCOUNTER — Encounter

## 2024-07-06 LAB — LAB REPORT - SCANNED
A1c: 6.8
EGFR: 69.2

## 2024-07-10 ENCOUNTER — Ambulatory Visit: Payer: Self-pay | Admitting: Cardiovascular Disease

## 2024-07-10 DIAGNOSIS — E785 Hyperlipidemia, unspecified: Secondary | ICD-10-CM

## 2024-07-12 ENCOUNTER — Other Ambulatory Visit (HOSPITAL_BASED_OUTPATIENT_CLINIC_OR_DEPARTMENT_OTHER): Payer: Self-pay | Admitting: Family

## 2024-07-12 DIAGNOSIS — Z720 Tobacco use: Secondary | ICD-10-CM

## 2024-07-13 MED ORDER — EZETIMIBE 10 MG PO TABS: 10.0000 mg | ORAL_TABLET | Freq: Every day | ORAL | 1 refills | Status: AC

## 2024-07-13 MED ORDER — ATORVASTATIN CALCIUM 80 MG PO TABS: 80.0000 mg | ORAL_TABLET | Freq: Every day | ORAL | 0 refills | Status: AC

## 2024-07-16 ENCOUNTER — Encounter: Payer: Self-pay | Admitting: *Deleted

## 2024-07-16 DIAGNOSIS — Z006 Encounter for examination for normal comparison and control in clinical research program: Secondary | ICD-10-CM

## 2024-07-16 NOTE — Research (Signed)
 Week 108 Follow-Up Visit Completed* control patient   []  Not Necessary, No Potential Adverse Events Or Medication Issues Reported On Completed Subject Questionnaire   [x]  Yes, Contact With Subject/Alternate Contact Completed   []  Yes, No Contact With Subject/Alternate Contact Completed, But Electronic Health Record Was Reviewed   []  No, Unable To Contact Subject/Alternate Contact   Have you reviewed Ongoing medications on the Targeted Concomitant Medication form and updated the form as needed?   [x]  Yes   []  No   Subject Status*   [x]  Continuing In Follow-up   []  At Risk For Lost To Follow-up   []  Withdrawal From All Future Study Activities Including Passive Follow-up By Electronic Health Record Review Or Contact With Healthcare Provider Or Family Member/Friend   []  Death   Vital Status*   [x]  Alive   []  Deceased   []  Unknown   Last Known To Be Alive Source*   [x]  Subject Completed Follow-up Questionnaire/Seen In Person/Via Telephone Contact   []  Family Member or Caretaker   []  Primary Physician Or Medical Records   []  Publicly Available Source   []  Other

## 2024-07-20 ENCOUNTER — Other Ambulatory Visit: Payer: Self-pay | Admitting: Family

## 2024-07-20 DIAGNOSIS — Z1231 Encounter for screening mammogram for malignant neoplasm of breast: Secondary | ICD-10-CM

## 2024-07-27 ENCOUNTER — Ambulatory Visit (HOSPITAL_BASED_OUTPATIENT_CLINIC_OR_DEPARTMENT_OTHER)

## 2024-07-27 ENCOUNTER — Inpatient Hospital Stay: Admission: RE | Admit: 2024-07-27 | Source: Ambulatory Visit

## 2024-07-27 ENCOUNTER — Inpatient Hospital Stay (HOSPITAL_BASED_OUTPATIENT_CLINIC_OR_DEPARTMENT_OTHER): Admission: RE | Admit: 2024-07-27 | Source: Ambulatory Visit | Admitting: Radiology

## 2024-07-30 ENCOUNTER — Ambulatory Visit: Attending: Cardiovascular Disease | Admitting: Cardiovascular Disease

## 2024-07-30 ENCOUNTER — Encounter: Payer: Self-pay | Admitting: Cardiovascular Disease

## 2024-07-30 VITALS — BP 128/72 | HR 71 | Ht 69.0 in | Wt 225.8 lb

## 2024-07-30 DIAGNOSIS — I255 Ischemic cardiomyopathy: Secondary | ICD-10-CM | POA: Diagnosis not present

## 2024-07-30 DIAGNOSIS — I70213 Atherosclerosis of native arteries of extremities with intermittent claudication, bilateral legs: Secondary | ICD-10-CM | POA: Insufficient documentation

## 2024-07-30 DIAGNOSIS — I10 Essential (primary) hypertension: Secondary | ICD-10-CM | POA: Diagnosis not present

## 2024-07-30 DIAGNOSIS — E782 Mixed hyperlipidemia: Secondary | ICD-10-CM | POA: Insufficient documentation

## 2024-07-30 DIAGNOSIS — E785 Hyperlipidemia, unspecified: Secondary | ICD-10-CM | POA: Diagnosis not present

## 2024-07-30 DIAGNOSIS — I5181 Takotsubo syndrome: Secondary | ICD-10-CM | POA: Diagnosis present

## 2024-07-30 DIAGNOSIS — I214 Non-ST elevation (NSTEMI) myocardial infarction: Secondary | ICD-10-CM | POA: Diagnosis present

## 2024-07-30 DIAGNOSIS — I739 Peripheral vascular disease, unspecified: Secondary | ICD-10-CM | POA: Insufficient documentation

## 2024-07-30 NOTE — Assessment & Plan Note (Signed)
 History of PAD status post right common iliac artery stenting by myself 07/26/2022 with a VBX 7/29 mm covered stent.  She came back with acute limb ischemia and underwent angiography by Dr. Magda 04/22/2023 revealing an occluded right common iliac artery stent and had this restented as well as stenting of her right external iliac artery and common femoral artery.  2 weeks later she reoccluded and underwent urgent femorofemoral crossover grafting by Dr. Serene 05/04/2023 which she follows.  She is on Xarelto .

## 2024-07-30 NOTE — Assessment & Plan Note (Signed)
 History of CAD status post proximal and mid LAD stenting in 2014.  She had Takotsubo syndrome in 2018 with catheter showed nonobstructive CAD.  She presented 7/23 with chest pain after the death of her brother the day before revealing patent stents with an area of narrowing between the 2 previously placed stents that was significant by DFR and this was restented.  She did have a moderate proximal dominant RCA stenosis.  She has been complaining of some chest and arm pain.  I am going to get a cardiac PET study to further evaluate.

## 2024-07-30 NOTE — Progress Notes (Signed)
 07/30/2024 Alice Bennett   Sep 16, 1961  [id number]  Primary Physician Silvano Angeline FALCON, NP Primary Cardiologist: Dorn JINNY Lesches MD [facility] [place], [place]  HPI:  Alice Bennett is a 63 y.o.  moderately overweight divorced Caucasian female mother of 4, grandmother of 8 grandchildren who is currently disabled and was referred by Delon Holts, PA-C for evaluation of symptomatic PAD. Her cardiologist was Dr. Charlena Sor.  I am assuming her cardiology care since he has retired.  She has had proximal and mid LAD intervention in 2014. She had Takotsubo syndrome in 2018. Cath showed nonobstructive CAD. She presented on 7/23 with chest pain after the death of her brother the night before. Cath showed stenosis between the 2 previously placed LAD stents which was hemodynamically significant by DFR and she was restented. She has been asymptomatic since. Her other problems include treated hypertension, diabetes and hyperlipidemia. Both her parents had CAD. She is complained of right hip and lower extremity claudication for a year which is lifestyle limiting with recent Dopplers performed 06/16/2022 revealing a right ABI of 0. 7 5 and a right ABI of 1.11 with a high-frequency signal in the right common iliac artery.  As result of this I performed peripheral angiography on her 07/26/2022 revealing a 70% proximal right common iliac artery stenosis with an 80 mm gradient.  I placed a 7 mm x 25 mm VBX stent with an excellent result.  Since I saw her 2 years ago she did have acute onset right lower extremity and anemia and underwent urgent angiography by Dr. Magda 04/22/2023 revealing occluded right common iliac artery stent which she was able to recanalize placing 3 additional stents.  2 weeks later she occluded again and underwent left to right femorofemoral bypass grafting by Dr. Serene.  She remains on Xarelto .  She has been complaining of left upper chest pain and arm pain as well.   Current Meds   Medication Sig   aspirin  EC 81 MG tablet Take 81 mg by mouth daily.   atorvastatin  (LIPITOR ) 80 MG tablet Take 1 tablet (80 mg total) by mouth daily at 6 PM.   BRILINTA  90 MG TABS tablet TAKE 1 TABLET(90 MG) BY MOUTH TWICE DAILY   carvedilol  (COREG ) 6.25 MG tablet Take 1 tablet (6.25 mg total) by mouth 2 (two) times daily with a meal.   Cholecalciferol  (D3 HIGH POTENCY) 125 MCG (5000 UT) capsule Take 5,000 Units by mouth daily.   clonazePAM  (KLONOPIN ) 1 MG tablet Take 1 mg by mouth 2 (two) times daily.   ezetimibe  (ZETIA ) 10 MG tablet Take 1 tablet (10 mg total) by mouth daily.   FARXIGA  10 MG TABS tablet Take 10 mg by mouth daily.    olmesartan  (BENICAR ) 20 MG tablet Take 1 tablet (20 mg total) by mouth daily.   OZEMPIC, 2 MG/DOSE, 8 MG/3ML SOPN Inject 2 mg into the skin once a week.   pantoprazole  (PROTONIX ) 40 MG tablet Take 1 tablet (40 mg total) by mouth daily.   pregabalin  (LYRICA ) 150 MG capsule Take 150 mg by mouth in the morning, at noon, and at bedtime.   sertraline  (ZOLOFT ) 100 MG tablet Take 100 mg by mouth daily.   XARELTO  20 MG TABS tablet Take 20 mg by mouth daily.     Allergies  Allergen Reactions   Codeine Itching    Social History   Socioeconomic History   Marital status: Single    Spouse name: Not on file  Number of children: Not on file   Years of education: Not on file   Highest education level: Not on file  Occupational History   Not on file  Tobacco Use   Smoking status: Former    Current packs/day: 0.00    Average packs/day: 0.5 packs/day for 10.0 years (5.0 ttl pk-yrs)    Types: Cigarettes    Quit date: 04/2023    Years since quitting: 1.3    Passive exposure: Current   Smokeless tobacco: Never  Vaping Use   Vaping status: Never Used  Substance and Sexual Activity   Alcohol use: No   Drug use: No   Sexual activity: Not on file  Other Topics Concern   Not on file  Social History Narrative   Not on file   Social Drivers of Health    Financial Resource Strain: Not on file  Food Insecurity: Patient Declined (10/26/2023)   Hunger Vital Sign    Worried About Running Out of Food in the Last Year: Patient declined    Ran Out of Food in the Last Year: Patient declined  Transportation Needs: Patient Declined (10/26/2023)   PRAPARE - Administrator, Civil Service (Medical): Patient declined    Lack of Transportation (Non-Medical): Patient declined  Physical Activity: Not on file  Stress: Not on file  Social Connections: Not on file  Intimate Partner Violence: Patient Declined (10/26/2023)   Humiliation, Afraid, Rape, and Kick questionnaire    Fear of Current or Ex-Partner: Patient declined    Emotionally Abused: Patient declined    Physically Abused: Patient declined    Sexually Abused: Patient declined     Review of Systems: General: negative for chills, fever, night sweats or weight changes.  Cardiovascular: negative for chest pain, dyspnea on exertion, edema, orthopnea, palpitations, paroxysmal nocturnal dyspnea or shortness of breath Dermatological: negative for rash Respiratory: negative for cough or wheezing Urologic: negative for hematuria Abdominal: negative for nausea, vomiting, diarrhea, bright red blood per rectum, melena, or hematemesis Neurologic: negative for visual changes, syncope, or dizziness All other systems reviewed and are otherwise negative except as noted above.    Blood pressure 128/72, pulse 71, height 5' 9 (1.753 m), weight 225 lb 12.8 oz (102.4 kg), SpO2 96%.  General appearance: alert and no distress Neck: no adenopathy, no carotid bruit, no JVD, supple, symmetrical, trachea midline, and thyroid not enlarged, symmetric, no tenderness/mass/nodules Lungs: clear to auscultation bilaterally Heart: regular rate and rhythm, S1, S2 normal, no murmur, click, rub or gallop Extremities: extremities normal, atraumatic, no cyanosis or edema Pulses: Diminished pedal pulses Skin: Skin  color, texture, turgor normal. No rashes or lesions Neurologic: Grossly normal  EKG EKG Interpretation Date/Time:  Monday July 30 2024 11:47:14 EDT Ventricular Rate:  71 PR Interval:  176 QRS Duration:  76 QT Interval:  378 QTC Calculation: 410 R Axis:   3  Text Interpretation: Normal sinus rhythm Minimal voltage criteria for LVH, may be normal variant ( R in aVL ) When compared with ECG of 21-Jun-2023 13:09, PREVIOUS ECG IS PRESENT Confirmed by Court Carrier [phone]) on 07/30/2024 11:58:31 AM    ASSESSMENT AND PLAN:   Ischemic cardiomyopathy Last 2D echo performed 05/27/2022 revealed an EF of 50 to 55%.  It has been lower than this in the past (EF 35 to 40% by echo 07/10/2017.) she is on GDMT.  Hyperlipidemia History of hyperlipidemia on high-dose statin therapy and Zetia  with lipid profile performed 02/06/2023 revealing total cholesterol 165, LDL 107 and  HDL 34.  We will recheck a lipid liver profile.  Hypertension History of essential hypertension blood pressure measured today at 128/72.  She is on carvedilol  and olmesartan .  Non-ST elevation (NSTEMI) myocardial infarction [facility]) History of CAD status post proximal and mid LAD stenting in 2014.  She had Takotsubo syndrome in 2018 with catheter showed nonobstructive CAD.  She presented 7/23 with chest pain after the death of her brother the day before revealing patent stents with an area of narrowing between the 2 previously placed stents that was significant by DFR and this was restented.  She did have a moderate proximal dominant RCA stenosis.  She has been complaining of some chest and arm pain.  I am going to get a cardiac PET study to further evaluate.  Claudication in peripheral vascular disease (HCC) History of PAD status post right common iliac artery stenting by myself 07/26/2022 with a VBX 7/29 mm covered stent.  She came back with acute limb ischemia and underwent angiography by Dr. Magda 04/22/2023 revealing an occluded  right common iliac artery stent and had this restented as well as stenting of her right external iliac artery and common femoral artery.  2 weeks later she reoccluded and underwent urgent femorofemoral crossover grafting by Dr. Serene 05/04/2023 which she follows.  She is on Xarelto .     Dorn DOROTHA Lesches MD FACP,FACC,FAHA, [facility] 07/30/2024 12:13 PM

## 2024-07-30 NOTE — Assessment & Plan Note (Signed)
 History of hyperlipidemia on high-dose statin therapy and Zetia  with lipid profile performed 02/06/2023 revealing total cholesterol 165, LDL 107 and HDL 34.  We will recheck a lipid liver profile.

## 2024-07-30 NOTE — Assessment & Plan Note (Signed)
 Last 2D echo performed 05/27/2022 revealed an EF of 50 to 55%.  It has been lower than this in the past (EF 35 to 40% by echo 07/10/2017.) she is on GDMT.

## 2024-07-30 NOTE — Assessment & Plan Note (Signed)
 History of essential hypertension blood pressure measured today at 128/72.  She is on carvedilol  and olmesartan .

## 2024-07-30 NOTE — Patient Instructions (Signed)
 Medication Instructions:  Your physician recommends that you continue on your current medications as directed. Please refer to the Current Medication list given to you today.  *If you need a refill on your cardiac medications before your next appointment, please call your pharmacy*  Lab Work: Your physician recommends that you have labs drawn today: Lipid/liver panel   If you have labs (blood work) drawn today and your tests are completely normal, you will receive your results only by: MyChart Message (if you have MyChart) OR A paper copy in the mail If you have any lab test that is abnormal or we need to change your treatment, we will call you to review the results.  Testing/Procedures: See below  Follow-Up: At [facility], you and your health needs are our priority.  As part of our continuing mission to provide you with exceptional heart care, our providers are all part of one team.  This team includes your primary Cardiologist (physician) and Advanced Practice Providers or APPs (Physician Assistants and Nurse Practitioners) who all work together to provide you with the care you need, when you need it.  Your next appointment:   3 month(s)  Provider:   Jon Hails, PA-C, Callie Goodrich, PA-C, Kathleen Johnson, PA-C, Hao Meng, PA-C, Damien Braver, NP, or Katlyn West, NP        Then, Dorn Lesches, MD will plan to see you again in 12 month(s).     We recommend signing up for the patient portal called MyChart.  Sign up information is provided on this After Visit Summary.  MyChart is used to connect with patients for Virtual Visits (Telemedicine).  Patients are able to view lab/test results, encounter notes, upcoming appointments, etc.  Non-urgent messages can be sent to your provider as well.   To learn more about what you can do with MyChart, go to [email or web address].   Other Instructions    Please report to Radiology at the [facility] Main Entrance 30  minutes early for your test.  [address] [place], [place] [address]                     How to Prepare for Your Cardiac PET/CT Stress Test:  Nothing to eat or drink, except water, 3 hours prior to arrival time.  NO caffeine/decaffeinated products, or chocolate 12 hours prior to arrival. (Please note decaffeinated beverages (teas/coffees) still contain caffeine).  If you have caffeine within 12 hours prior, the test will need to be rescheduled.  Medication instructions: Do not take erectile dysfunction medications for 72 hours prior to test (sildenafil, tadalafil) Do not take nitrates (isosorbide mononitrate, Ranexa) the day before or day of test Do not take tamsulosin the day before or morning of test Hold theophylline containing medications for 12 hours. Hold Dipyridamole 48 hours prior to the test.  Diabetic Preparation: If able to eat breakfast prior to 3 hour fasting, you may take all medications, including your insulin . Do not worry if you miss your breakfast dose of insulin  - start at your next meal. If you do not eat prior to 3 hour fast-Hold all diabetes (oral and insulin ) medications. Patients who wear a continuous glucose monitor MUST remove the device prior to scanning.  You may take your remaining medications with water.  NO perfume, cologne or lotion on chest or abdomen area. FEMALES - Please avoid wearing dresses to this appointment.  Total time is 1 to 2 hours; you may want to bring reading  material for the waiting time.  IF YOU THINK YOU MAY BE PREGNANT, OR ARE NURSING PLEASE INFORM THE TECHNOLOGIST.  In preparation for your appointment, medication and supplies will be purchased.  Appointment availability is limited, so if you need to cancel or reschedule, please call the Radiology Department Scheduler at [phone] 24 hours in advance to avoid a cancellation fee of $100.00  What to Expect When you Arrive:  Once you arrive and check in for your appointment,  you will be taken to a preparation room within the Radiology Department.  A technologist or Nurse will obtain your medical history, verify that you are correctly prepped for the exam, and explain the procedure.  Afterwards, an IV will be started in your arm and electrodes will be placed on your skin for EKG monitoring during the stress portion of the exam. Then you will be escorted to the PET/CT scanner.  There, staff will get you positioned on the scanner and obtain a blood pressure and EKG.  During the exam, you will continue to be connected to the EKG and blood pressure machines.  A small, safe amount of a radioactive tracer will be injected in your IV to obtain a series of pictures of your heart along with an injection of a stress agent.    After your Exam:  It is recommended that you eat a meal and drink a caffeinated beverage to counter act any effects of the stress agent.  Drink plenty of fluids for the remainder of the day and urinate frequently for the first couple of hours after the exam.  Your doctor will inform you of your test results within 7-10 business days.  For more information and frequently asked questions, please visit our website: [email or web address]  For questions about your test or how to prepare for your test, please call: Cardiac Imaging Nurse Navigators Office: [phone]

## 2024-07-31 ENCOUNTER — Ambulatory Visit: Payer: Self-pay | Admitting: Cardiovascular Disease

## 2024-07-31 DIAGNOSIS — E785 Hyperlipidemia, unspecified: Secondary | ICD-10-CM

## 2024-07-31 LAB — LIPID PANEL
Chol/HDL Ratio: 4.2 ratio (ref 0.0–4.4)
Cholesterol, Total: 183 mg/dL (ref 100–199)
HDL: 44 mg/dL (ref >39)
LDL Chol Calc (NIH): 117 mg/dL — ABNORMAL HIGH (ref 0–99)
Triglycerides: 122 mg/dL (ref 0–149)
VLDL Cholesterol Cal: 22 mg/dL (ref 5–40)

## 2024-07-31 LAB — HEPATIC FUNCTION PANEL
ALT: 14 IU/L (ref 0–32)
AST: 22 IU/L (ref 0–40)
Albumin: 4.1 g/dL (ref 3.9–4.9)
Alkaline Phosphatase: 90 IU/L (ref 49–135)
Bilirubin Total: 0.3 mg/dL (ref 0.0–1.2)
Bilirubin, Direct: <0.08 mg/dL (ref 0.00–0.40)
Total Protein: 7.2 g/dL (ref 6.0–8.5)

## 2024-08-06 ENCOUNTER — Encounter (HOSPITAL_COMMUNITY)

## 2024-08-06 ENCOUNTER — Ambulatory Visit

## 2024-08-06 ENCOUNTER — Ambulatory Visit (HOSPITAL_COMMUNITY)

## 2024-08-09 NOTE — Anesthesia Postprocedure Evaluation (Signed)
  Patient: Alice Bennett Procedure(s): APPENDECTOMY ROBOTIC  Anesthesia type: general  Anesthesia Post Evaluation  Patient location during evaluation: bedside Patient participation: complete - patient participated Level of consciousness: awake and alert Pain management: adequate Multimodal analgesia pain management approach Airway patency: patent Two or more strategies used to mitigate risk of obstructive sleep apnea Cardiovascular status: acceptable, blood pressure returned to baseline and hemodynamically stable Respiratory status: acceptable Hydration status: acceptable Vitals: stable Temperature: temperature adequate >96.74F PONV: nausea and vomiting controlled Regional Anesthesia: no block performed    BP: (!) 154/69 (08/09/24 1445) Heart Rate: 67 (08/09/24 1445) Resp: 14 (08/09/24 1445) Temp: 98 F (36.7 C) (08/09/24 1445) SpO2: 97 % (08/09/24 1445)         Notable Events:    No Anesthesia notable events documented.

## 2024-08-29 ENCOUNTER — Ambulatory Visit (HOSPITAL_COMMUNITY)

## 2024-09-01 LAB — COLOGUARD

## 2024-09-03 ENCOUNTER — Encounter: Payer: Self-pay | Admitting: *Deleted

## 2024-09-03 DIAGNOSIS — Z006 Encounter for examination for normal comparison and control in clinical research program: Secondary | ICD-10-CM

## 2024-09-17 ENCOUNTER — Ambulatory Visit: Admitting: Pharmacist

## 2024-09-17 ENCOUNTER — Telehealth: Payer: Self-pay | Admitting: Pharmacist

## 2024-09-17 ENCOUNTER — Other Ambulatory Visit (HOSPITAL_COMMUNITY): Payer: Self-pay

## 2024-09-17 NOTE — Telephone Encounter (Signed)
 on high intensity  statin LDL still elevated - will assess coverage for PCSK9i

## 2024-09-17 NOTE — Telephone Encounter (Signed)
 Pharmacy Patient Advocate Encounter   Received notification from Physician's Office that prior authorization for REPATHA is required/requested.   Insurance verification completed.   The patient is insured through [facility] MEDICAID.   Per test claim: PA required; PA submitted to above mentioned insurance via Latent Key/confirmation #/EOC [id number] Status is pending

## 2024-09-17 NOTE — Progress Notes (Deleted)
 Patient ID: Alice Bennett                 DOB: Apr 08, 1961                    MRN: [id number]      HPI: Alice Bennett is a 63 y.o. female patient referred to lipid clinic by [facility]. PMH is significant for unstable angina, PAD, CAD, hx of NSTEMI, CHF, lower limb ischemia, T2DM, tobacco use, HLD   Patient currently on atorvastatin  80 gm daily( fill hx reflects adherence to the therapy) Zetia  10 mg (inconsistent  fill hx suspect intolerance) will discuss that at today's visit   Reviewed options for lowering LDL cholesterol, including ezetimibe , PCSK-9 inhibitors, bempedoic acid and inclisiran.  Discussed mechanisms of action, dosing, side effects and potential decreases in LDL cholesterol.  Also reviewed cost information and potential options for patient assistance.  Current Medications: atorvastatin  80 mg daily and Zetia  10 mg daily  Intolerances: none  Risk Factors:  PAD, CAD, hx of NSTEMI, CHF, lower limb ischemia, T2DM, tobacco use, HLD  LDL goal: <55 mg/dl TG <849 mg/dl  Last lab 90/7974 TC 816, TG 122, HDL 44, LDLc 117   Diet:   Exercise:   Family History:   Social History:  Alcohol: none  Smoking: quit 1.3 years ago  Labs:  Lipid Panel     Component Value Date/Time   CHOL 183 07/30/2024 1258   TRIG 122 07/30/2024 1258   HDL 44 07/30/2024 1258   CHOLHDL 4.2 07/30/2024 1258   CHOLHDL 4.9 02/06/2023 0126   VLDL 24 02/06/2023 0126   LDLCALC 117 (H) 07/30/2024 1258   LABVLDL 22 07/30/2024 1258    Past Medical History:  Diagnosis Date   CAD (coronary artery disease)    07/10/17 PCI/DES to LAD, EF 35-40%   Diabetes mellitus without complication (HCC)    Hyperlipidemia    Hypertension    NSTEMI (non-ST elevated myocardial infarction) (HCC)     Current Outpatient Medications on File Prior to Visit  Medication Sig Dispense Refill   aspirin  EC 81 MG tablet Take 81 mg by mouth daily.     atorvastatin  (LIPITOR ) 80 MG tablet Take 1 tablet (80 mg total) by mouth daily at  6 PM. 90 tablet 0   BRILINTA  90 MG TABS tablet TAKE 1 TABLET(90 MG) BY MOUTH TWICE DAILY 60 tablet 2   carvedilol  (COREG ) 6.25 MG tablet Take 1 tablet (6.25 mg total) by mouth 2 (two) times daily with a meal. 60 tablet 0   Cholecalciferol  (D3 HIGH POTENCY) 125 MCG (5000 UT) capsule Take 5,000 Units by mouth daily.     clonazePAM  (KLONOPIN ) 1 MG tablet Take 1 mg by mouth 2 (two) times daily.     ezetimibe  (ZETIA ) 10 MG tablet Take 1 tablet (10 mg total) by mouth daily. 90 tablet 1   FARXIGA  10 MG TABS tablet Take 10 mg by mouth daily.      olmesartan  (BENICAR ) 20 MG tablet Take 1 tablet (20 mg total) by mouth daily. 90 tablet 3   oxyCODONE -acetaminophen  (PERCOCET/ROXICET) 5-325 MG tablet Take 1 tablet by mouth every 6 (six) hours as needed for severe pain (pain score 7-10). 20 tablet 0   OZEMPIC, 2 MG/DOSE, 8 MG/3ML SOPN Inject 2 mg into the skin once a week.     pantoprazole  (PROTONIX ) 40 MG tablet Take 1 tablet (40 mg total) by mouth daily. 30 tablet 0   pregabalin  (LYRICA ) 150 MG  capsule Take 150 mg by mouth in the morning, at noon, and at bedtime.     sertraline  (ZOLOFT ) 100 MG tablet Take 100 mg by mouth daily.     XARELTO  20 MG TABS tablet Take 20 mg by mouth daily.     No current facility-administered medications on file prior to visit.    Allergies  Allergen Reactions   Codeine Itching    Assessment/Plan:  1. Hyperlipidemia -  No problems updated. No problem-specific Assessment & Plan notes found for this encounter.    Thank you,  Robbi Blanch, Pharm.D [facility] [address], [place], [place] [address] Phone: [phone]; Fax: [phone]

## 2024-09-18 NOTE — Telephone Encounter (Signed)
 Pharmacy Patient Advocate Encounter  Received notification from [facility] MEDICAID that Prior Authorization for REPATHA has been DENIED.  Full denial letter will be uploaded to the media tab. See denial reason below.   MUST SHOW PLAN PT HAS FAILED 2 STATINS AND ZETIA . PLAN NEEDS PRE AND POST STATIN/ZETIA  LABS WITH TREATMENT TRIAL OCCURRING FOR AT LEAST 90 DAYS. ONLY SAW LIPITOR  IN RECENT MED HISTORY SO LIKELY BEING DENIED FOR NOT ALSO TRYING MAX DOSE OF CRESTOR BEFORE REQUESTING PCSK9I.

## 2024-09-20 NOTE — Research (Signed)
 Week 120 Follow-Up Visit Completed* Control patient   []  Not Necessary, No Potential Adverse Events Or Medication Issues Reported On Completed Subject Questionnaire   []  Yes, Contact With Subject/Alternate Contact Completed   [x]  Yes, No Contact With Subject/Alternate Contact Completed, But Electronic Health Record Was Reviewed   []  No, Unable To Contact Subject/Alternate Contact   Have you reviewed Ongoing medications on the Targeted Concomitant Medication form and updated the form as needed?   [x]  Yes   []  No   Subject Status*   [x]  Continuing In Follow-up   []  At Risk For Lost To Follow-up   []  Withdrawal From All Future Study Activities Including Passive Follow-up By Electronic Health Record Review Or Contact With Healthcare Provider Or Family Member/Friend   []  Death   Vital Status*   [x]  Alive   []  Deceased   []  Unknown   Last Known To Be Alive Source*   []  Subject Completed Follow-up Questionnaire/Seen In Person/Via Telephone Contact   []  Family Member or Caretaker   [x]  Primary Physician Or Medical Records   []  Publicly Available Source   []  Other

## 2024-10-15 ENCOUNTER — Emergency Department (HOSPITAL_COMMUNITY)

## 2024-10-15 ENCOUNTER — Inpatient Hospital Stay (HOSPITAL_COMMUNITY)
Admission: EM | Admit: 2024-10-15 | Discharge: 2024-10-19 | DRG: 254 | Disposition: A | Attending: Critical Care Medicine | Admitting: Critical Care Medicine

## 2024-10-15 DIAGNOSIS — I2510 Atherosclerotic heart disease of native coronary artery without angina pectoris: Secondary | ICD-10-CM | POA: Diagnosis present

## 2024-10-15 DIAGNOSIS — E1159 Type 2 diabetes mellitus with other circulatory complications: Secondary | ICD-10-CM | POA: Diagnosis present

## 2024-10-15 DIAGNOSIS — F419 Anxiety disorder, unspecified: Secondary | ICD-10-CM | POA: Diagnosis present

## 2024-10-15 DIAGNOSIS — E785 Hyperlipidemia, unspecified: Secondary | ICD-10-CM | POA: Diagnosis not present

## 2024-10-15 DIAGNOSIS — D649 Anemia, unspecified: Secondary | ICD-10-CM | POA: Diagnosis not present

## 2024-10-15 DIAGNOSIS — I152 Hypertension secondary to endocrine disorders: Secondary | ICD-10-CM | POA: Diagnosis present

## 2024-10-15 DIAGNOSIS — E1169 Type 2 diabetes mellitus with other specified complication: Secondary | ICD-10-CM | POA: Diagnosis present

## 2024-10-15 DIAGNOSIS — I70222 Atherosclerosis of native arteries of extremities with rest pain, left leg: Principal | ICD-10-CM | POA: Diagnosis present

## 2024-10-15 DIAGNOSIS — E118 Type 2 diabetes mellitus with unspecified complications: Secondary | ICD-10-CM | POA: Diagnosis present

## 2024-10-15 DIAGNOSIS — Z720 Tobacco use: Secondary | ICD-10-CM | POA: Diagnosis present

## 2024-10-15 DIAGNOSIS — I739 Peripheral vascular disease, unspecified: Secondary | ICD-10-CM | POA: Diagnosis present

## 2024-10-15 LAB — CBC WITH DIFFERENTIAL/PLATELET
Abs Immature Granulocytes: 0.03 K/uL (ref 0.00–0.07)
Basophils Absolute: 0 K/uL (ref 0.0–0.1)
Basophils Relative: 0 %
Eosinophils Absolute: 0.1 K/uL (ref 0.0–0.5)
Eosinophils Relative: 1 %
HCT: 44.3 % (ref 36.0–46.0)
Hemoglobin: 14.3 g/dL (ref 12.0–15.0)
Immature Granulocytes: 0 %
Lymphocytes Relative: 27 %
Lymphs Abs: 2.1 K/uL (ref 0.7–4.0)
MCH: 28.9 pg (ref 26.0–34.0)
MCHC: 32.3 g/dL (ref 30.0–36.0)
MCV: 89.5 fL (ref 80.0–100.0)
Monocytes Absolute: 0.5 K/uL (ref 0.1–1.0)
Monocytes Relative: 6 %
Neutro Abs: 5.2 K/uL (ref 1.7–7.7)
Neutrophils Relative %: 66 %
Platelets: 197 K/uL (ref 150–400)
RBC: 4.95 MIL/uL (ref 3.87–5.11)
RDW: 15.5 % (ref 11.5–15.5)
WBC: 8 K/uL (ref 4.0–10.5)
nRBC: 0 % (ref 0.0–0.2)

## 2024-10-15 LAB — BASIC METABOLIC PANEL WITH GFR
Anion gap: 10 (ref 5–15)
BUN: 8 mg/dL (ref 8–23)
CO2: 27 mmol/L (ref 22–32)
Calcium: 9.2 mg/dL (ref 8.9–10.3)
Chloride: 104 mmol/L (ref 98–111)
Creatinine, Ser: 0.91 mg/dL (ref 0.44–1.00)
GFR, Estimated: >60 mL/min (ref >60)
Glucose, Bld: 142 mg/dL — ABNORMAL HIGH (ref 70–99)
Potassium: 4 mmol/L (ref 3.5–5.1)
Sodium: 140 mmol/L (ref 135–145)

## 2024-10-15 LAB — CBG MONITORING, ED: Glucose-Capillary: 156 mg/dL — ABNORMAL HIGH (ref 70–99)

## 2024-10-15 MED ORDER — OXYCODONE-ACETAMINOPHEN 5-325 MG PO TABS
1.0000 | ORAL_TABLET | Freq: Once | ORAL | Status: AC
  Administered 2024-10-15: 1 via ORAL
  Filled 2024-10-15: qty 1

## 2024-10-15 MED ORDER — PREGABALIN 75 MG PO CAPS
150.0000 mg | ORAL_CAPSULE | Freq: Three times a day (TID) | ORAL | Status: AC
  Administered 2024-10-15 – 2024-10-19 (×10): 150 mg via ORAL
  Filled 2024-10-15 (×2): qty 2
  Filled 2024-10-15: qty 3
  Filled 2024-10-15: qty 6
  Filled 2024-10-15 (×6): qty 2

## 2024-10-15 MED ORDER — IOHEXOL 350 MG/ML SOLN
100.0000 mL | Freq: Once | INTRAVENOUS | Status: AC | PRN
  Administered 2024-10-15: 100 mL via INTRAVENOUS

## 2024-10-15 MED ORDER — ONDANSETRON HCL 4 MG/2ML IJ SOLN
4.0000 mg | Freq: Four times a day (QID) | INTRAMUSCULAR | Status: DC | PRN
  Administered 2024-10-17: 4 mg via INTRAVENOUS
  Filled 2024-10-15: qty 2

## 2024-10-15 MED ORDER — EZETIMIBE 10 MG PO TABS
10.0000 mg | ORAL_TABLET | Freq: Every day | ORAL | Status: DC
  Administered 2024-10-17 – 2024-10-19 (×3): 10 mg via ORAL
  Filled 2024-10-15 (×3): qty 1

## 2024-10-15 MED ORDER — ENOXAPARIN SODIUM 40 MG/0.4ML IJ SOSY: 40.0000 mg | PREFILLED_SYRINGE | INTRAMUSCULAR | Status: DC

## 2024-10-15 MED ORDER — INSULIN ASPART 100 UNIT/ML IJ SOLN
0.0000 [IU] | Freq: Three times a day (TID) | INTRAMUSCULAR | Status: DC
  Administered 2024-10-16: 2 [IU] via SUBCUTANEOUS
  Administered 2024-10-16 – 2024-10-17 (×2): 1 [IU] via SUBCUTANEOUS
  Administered 2024-10-17 – 2024-10-18 (×2): 2 [IU] via SUBCUTANEOUS
  Administered 2024-10-18 – 2024-10-19 (×3): 1 [IU] via SUBCUTANEOUS
  Filled 2024-10-15: qty 1
  Filled 2024-10-15 (×2): qty 2
  Filled 2024-10-15: qty 1
  Filled 2024-10-15: qty 2
  Filled 2024-10-15 (×3): qty 1

## 2024-10-15 MED ORDER — ACETAMINOPHEN 325 MG PO TABS
650.0000 mg | ORAL_TABLET | Freq: Four times a day (QID) | ORAL | Status: DC | PRN
  Administered 2024-10-16: 650 mg via ORAL
  Filled 2024-10-15: qty 2

## 2024-10-15 MED ORDER — INSULIN ASPART 100 UNIT/ML IJ SOLN: 0.0000 [IU] | Freq: Every day | INTRAMUSCULAR | Status: DC

## 2024-10-15 MED ORDER — BISACODYL 5 MG PO TBEC: 5.0000 mg | DELAYED_RELEASE_TABLET | Freq: Every day | ORAL | Status: DC | PRN

## 2024-10-15 MED ORDER — PANTOPRAZOLE SODIUM 40 MG PO TBEC
40.0000 mg | DELAYED_RELEASE_TABLET | Freq: Every day | ORAL | Status: DC
  Administered 2024-10-17 – 2024-10-19 (×3): 40 mg via ORAL
  Filled 2024-10-15 (×3): qty 1

## 2024-10-15 MED ORDER — ACETAMINOPHEN 650 MG RE SUPP: 650.0000 mg | Freq: Four times a day (QID) | RECTAL | Status: DC | PRN

## 2024-10-15 MED ORDER — TICAGRELOR 90 MG PO TABS
90.0000 mg | ORAL_TABLET | Freq: Two times a day (BID) | ORAL | Status: DC
  Administered 2024-10-15 – 2024-10-17 (×4): 90 mg via ORAL
  Filled 2024-10-15 (×6): qty 1

## 2024-10-15 MED ORDER — IRBESARTAN 150 MG PO TABS
150.0000 mg | ORAL_TABLET | Freq: Every day | ORAL | Status: DC
  Administered 2024-10-17: 150 mg via ORAL
  Filled 2024-10-15 (×3): qty 1

## 2024-10-15 MED ORDER — CLONAZEPAM 1 MG PO TABS
1.0000 mg | ORAL_TABLET | Freq: Two times a day (BID) | ORAL | Status: DC
  Administered 2024-10-15 – 2024-10-16 (×2): 1 mg via ORAL
  Filled 2024-10-15: qty 1
  Filled 2024-10-15: qty 2

## 2024-10-15 MED ORDER — CARVEDILOL 6.25 MG PO TABS
6.2500 mg | ORAL_TABLET | Freq: Two times a day (BID) | ORAL | Status: DC
  Administered 2024-10-16 – 2024-10-18 (×5): 6.25 mg via ORAL
  Filled 2024-10-15 (×4): qty 1
  Filled 2024-10-15: qty 2

## 2024-10-15 MED ORDER — ATORVASTATIN CALCIUM 80 MG PO TABS
80.0000 mg | ORAL_TABLET | Freq: Every day | ORAL | Status: DC
  Administered 2024-10-16 – 2024-10-18 (×3): 80 mg via ORAL
  Filled 2024-10-15 (×3): qty 1

## 2024-10-15 MED ORDER — ASPIRIN 81 MG PO TBEC
81.0000 mg | DELAYED_RELEASE_TABLET | Freq: Every day | ORAL | Status: DC
  Administered 2024-10-16 – 2024-10-19 (×4): 81 mg via ORAL
  Filled 2024-10-15 (×4): qty 1

## 2024-10-15 MED ORDER — ONDANSETRON HCL 4 MG PO TABS: 4.0000 mg | ORAL_TABLET | Freq: Four times a day (QID) | ORAL | Status: DC | PRN

## 2024-10-15 MED ORDER — HYDROCODONE-ACETAMINOPHEN 5-325 MG PO TABS
1.0000 | ORAL_TABLET | ORAL | Status: DC | PRN
  Administered 2024-10-16 – 2024-10-17 (×4): 2 via ORAL
  Administered 2024-10-18: 1 via ORAL
  Administered 2024-10-18 – 2024-10-19 (×2): 2 via ORAL
  Filled 2024-10-15 (×4): qty 2
  Filled 2024-10-15: qty 1
  Filled 2024-10-15 (×2): qty 2

## 2024-10-15 MED ORDER — SENNOSIDES-DOCUSATE SODIUM 8.6-50 MG PO TABS: 1.0000 | ORAL_TABLET | Freq: Every evening | ORAL | Status: DC | PRN

## 2024-10-15 NOTE — H&P (Signed)
 History and Physical    Alice Bennett FMW:[id number] DOB: December 28, 1960 DOA: 10/15/2024  PCP: Silvano Angeline JULIANNA, NP  Patient coming from: Home  I have personally briefly reviewed patient's old medical records in [facility]  Chief Complaint: Left toe discoloration and pain  HPI: Alice Bennett is a 63 y.o. female with medical history significant for CAD s/p DES to LAD 2018, PAD (s/p left right femoro-femoral bypass 2024, right fourth toe amputation), T2DM, HTN, HLD, anxiety, tobacco use s/p appendectomy 2025 who presented to the ED for evaluation of discoloration and pain of left great toe.  Patient reports that she first noticed purplish discoloration of the distal half of her left great toe sometime after Thanksgiving.  She has had associated pain to the toe.  She does report chronic neuropathy affecting the distal half of her feet.  She has not had any fevers, chills, diaphoresis.  She has not seen any open wound or drainage from the toe.  She reports adherence to aspirin  and Brilinta .  ED Course  Labs/Imaging on admission: I have personally reviewed following labs and imaging studies.  Initial vitals showed BP 160/65, pulse 83, RR 18, temp 98.1 F, SpO2 96% on room air.  Labs show sodium 140, potassium 4.0, bicarb 27, BUN 8, creatinine 0.91, serum glucose 142, WBC 8.0, hemoglobin 14.3, platelets 197.  Left foot x-ray negative for evidence of fracture, dislocation, or other focal bony abnormality.  CTA aorta bifemoral with and without contrast IMPRESSION: VASCULAR  1. Stable atheromatous plaque throughout the abdominal aorta, without significant stenosis. 2. 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 3. 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 4. Patent femoral-femoral bypass graft.   Right lower extremity:  1. 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 2.  Diminutive but patent right SFA and popliteal arteries. 3. Patent 2 vessel runoff to the ankle, with occlusion of the anterior tibial artery beyond its origin.   Left lower extremity:  1. New segmental occlusion of the left SFA within the mid thigh, extending approximately 8 cm in length. Distal reconstitution via collaterals from the profundus femoral system. 2. Patent 2 vessel runoff to the ankle, with occlusion of the anterior tibial artery beyond its origin.   NON-VASCULAR  1. No acute intra-abdominal or intrapelvic process. 2. Stable left adrenal adenoma.  Patient was given Percocet.  EDP spoke with vascular surgery (Dr. Lanis) who recommended medical admission to [facility] and their team will see in consultation, keep n.p.o. after midnight.  The hospitalist service was consulted for admission.  Review of Systems: All systems reviewed and are negative except as documented in history of present illness above.   Past Medical History:  Diagnosis Date   CAD (coronary artery disease)    07/10/17 PCI/DES to LAD, EF 35-40%   Diabetes mellitus without complication (HCC)    Hyperlipidemia    Hypertension    NSTEMI (non-ST elevated myocardial infarction) [facility])     Past Surgical History:  Procedure Laterality Date   ABDOMINAL AORTOGRAM W/LOWER EXTREMITY N/A 07/26/2022   Procedure: ABDOMINAL AORTOGRAM W/LOWER EXTREMITY;  Surgeon: Court Dorn PARAS, MD;  Location: [facility] INVASIVE CV [facility];  Service: Cardiovascular;  Laterality: N/A;   ABDOMINAL AORTOGRAM W/LOWER EXTREMITY N/A 04/22/2023   Procedure: ABDOMINAL AORTOGRAM W/LOWER EXTREMITY;  Surgeon: Magda Debby SAILOR, MD;  Location: [facility] INVAS[facility] CV [facility];  Service: Cardiovascular;  Laterality: N/A;  AMPUTATION TOE Right 10/27/2023   Procedure: AMPUTATION TOE;  Surgeon: Malvin Marsa FALCON, DPM;  Location: [facility] ORS;  Service: Orthopedics/Podiatry;  Laterality: Right;  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   AORTOGRAM Bilateral 02/05/2023    Procedure: AORTOGRAM WITH RUN OFF;  Surgeon: Serene Gaile ORN, MD;  Location: [facility] OR;  Service: Vascular;  Laterality: Bilateral;   APPLICATION OF WOUND VAC Bilateral 05/04/2023   Procedure: APPLICATION OF WOUND VAC;  Surgeon: Serene Gaile ORN, MD;  Location: [facility] OR;  Service: Vascular;  Laterality: Bilateral;   CORONARY PRESSURE/FFR STUDY N/A 07/10/2017   Procedure: INTRAVASCULAR PRESSURE WIRE/FFR STUDY;  Surgeon: Jordan, Peter M, MD;  Location: [facility];  Service: Cardiovascular;  Laterality: N/A;   CORONARY PRESSURE/FFR STUDY N/A 05/31/2022   Procedure: INTRAVASCULAR PRESSURE WIRE/FFR STUDY;  Surgeon: Mady Bruckner, MD;  Location: [facility];  Service: Cardiovascular;  Laterality: N/A;   CORONARY STENT INTERVENTION N/A 07/10/2017   Procedure: CORONARY STENT INTERVENTION;  Surgeon: Jordan, Peter M, MD;  Location: [facility] INVASIVE CV LAB;  Service: Cardiovascular;  Laterality: N/A;   CORONARY STENT INTERVENTION N/A 05/31/2022   Procedure: CORONARY STENT INTERVENTION;  Surgeon: Mady Bruckner, MD;  Location: [facility];  Service: Cardiovascular;  Laterality: N/A;   FEMORAL-FEMORAL BYPASS GRAFT Bilateral 05/04/2023   Procedure: LEFT TO RIGHT FEMORAL-FEMORAL ARTERY BYPASS;  Surgeon: Serene Gaile ORN, MD;  Location: [facility];  Service: Vascular;  Laterality: Bilateral;   INSERTION OF ILIAC STENT Right 02/05/2023   Procedure: INSERTION OF RIGHT ILIAC STENT;  Surgeon: Serene Gaile ORN, MD;  Location: [facility];  Service: Vascular;  Laterality: Right;   LEFT HEART CATH AND CORONARY ANGIOGRAPHY N/A 07/10/2017   Procedure: LEFT HEART CATH AND CORONARY ANGIOGRAPHY;  Surgeon: Jordan, Peter M, MD;  Location: [facility]VASIVE CV LAB;  Service: Cardiovascular;  Laterality: N/A;   LOWER EXTREMITY ANGIOGRAM N/A 02/05/2023   Procedure: RIGHT LOWER EXTREMITY ANGIOGRAM;  Surgeon: Serene Gaile ORN, MD;  Location: [facility];  Service: Vascular;  Laterality: N/A;   PERIPHERAL VASCULAR INTERVENTION  07/26/2022   Procedure:  PERIPHERAL VASCULAR INTERVENTION;  Surgeon: Court Dorn PARAS, MD;  [facility]: [facility];  Service: Cardiovascular;;   PERIPHERAL VASCULAR INTERVENTION  04/22/2023   Procedure: PERIPHERAL VASCULAR INTERVENTION;  Surgeon: Magda Debby SAILOR, MD;  Location: [facility];  Service: Cardiovascular;;  right common and ecternal iliac, right common femoral   RIGHT/LEFT HEART CATH AND CORONARY ANGIOGRAPHY N/A 05/31/2022   Procedure: RIGHT/LEFT HEART CATH AND CORONARY ANGIOGRAPHY;  Surgeon: Mady Bruckner, MD;  Location: [facility];  Service: Cardiovascular;  Laterality: N/A;   ULTRASOUND GUIDANCE FOR VASCULAR ACCESS Bilateral 02/05/2023   Procedure: ULTRASOUND GUIDANCE FOR VASCULAR ACCESS;  Surgeon: Serene Gaile ORN, MD;  Location: [facility] OR;  Service: Vascular;  Laterality: Bilateral;    Social History: Patient reports ongoing tobacco use, she has cut down to less than half a pack per day.  Allergies  Allergen Reactions   Codeine Itching    No family history on file.   Prior to Admission medications   Medication Sig Start Date End Date Taking? Authorizing Provider  aspirin  EC 81 MG tablet Take 81 mg by mouth daily.    [provider]  atorvastatin  (LIPITOR ) 80 MG tablet Take 1 tablet (80 mg total) by mouth daily at 6 PM. 07/13/24   Court Dorn PARAS, MD  BRILINTA  90 MG TABS tablet TAKE 1 TABLET(90 MG) BY MOUTH TWICE DAILY 03/07/24   Serene Gaile  W, MD  carvedilol  (COREG ) 6.25 MG tablet Take 1 tablet (6.25 mg total) by mouth 2 (two) times daily with a meal. 07/19/17   Burnard Debby LABOR, MD  Cholecalciferol  (D3 HIGH POTENCY) 125 MCG (5000 UT) capsule Take 5,000 Units by mouth daily.    [provider]  clonazePAM  (KLONOPIN ) 1 MG tablet Take 1 mg by mouth 2 (two) times daily.    [provider]  ezetimibe  (ZETIA ) 10 MG tablet Take 1 tablet (10 mg total) by mouth daily. 07/13/24   Court Dorn PARAS, MD  FARXIGA  10 MG TABS tablet Take 10 mg by mouth daily.   03/28/19   [provider]  olmesartan  (BENICAR ) 20 MG tablet Take 1 tablet (20 mg total) by mouth daily. 03/09/21   Burnard Debby LABOR, MD  oxyCODONE -acetaminophen  (PERCOCET/ROXICET) 5-325 MG tablet Take 1 tablet by mouth every 6 (six) hours as needed for severe pain (pain score 7-10). 10/27/23   Regalado, Belkys A, MD  OZEMPIC, 2 MG/DOSE, 8 MG/3ML SOPN Inject 2 mg into the skin once a week. 02/01/23   [provider]  pantoprazole  (PROTONIX ) 40 MG tablet Take 1 tablet (40 mg total) by mouth daily. 07/19/17   Burnard Debby LABOR, MD  pregabalin  (LYRICA ) 150 MG capsule Take 150 mg by mouth in the morning, at noon, and at bedtime.    [provider]  sertraline  (ZOLOFT ) 100 MG tablet Take 100 mg by mouth daily.    [provider]  XARELTO  20 MG TABS tablet Take 20 mg by mouth daily. 05/25/23   [provider]    Physical Exam: Vitals:   10/15/24 1600 10/15/24 2031  BP: (!) 160/65 (!) 189/89  Pulse: 83 64  Resp: 18 16  Temp: 98.1 F (36.7 C) 97.8 F (36.6 C)  TempSrc: Oral Oral  SpO2: 96% 95%   Constitutional: Resting in bed, NAD, calm, comfortable Eyes: EOMI, lids and conjunctivae normal ENMT: Mucous membranes are moist. Posterior pharynx clear of any exudate or lesions.Normal dentition.  Neck: normal, supple, no masses. Respiratory: clear to auscultation bilaterally, no wheezing, no crackles. Normal respiratory effort. No accessory muscle use.  Cardiovascular: Regular rate and rhythm, no murmurs / rubs / gallops. No extremity edema.  Pedal pulses difficult to palpate. Abdomen: no tenderness, no masses palpated. Musculoskeletal: S/p right fourth toe amputation.  No clubbing / cyanosis. Good ROM, no contractures. Normal muscle tone.  Skin: Purpleish discoloration of left first toe, distal aspect tender to palpation.  No open wound, discharge, or necrotic changes. Neurologic: Sensation intact. Strength 5/5 in all 4.  Psychiatric: Normal judgment and  insight. Alert and oriented x 3. Normal mood.       EKG: Not performed.  Assessment/Plan Principal Problem:   Critical limb ischemia of left lower extremity (HCC) Active Problems:   Hyperlipidemia associated with type 2 diabetes mellitus (HCC)   Hypertension associated with diabetes (HCC)   Tobacco use   Peripheral arterial disease   Type 2 diabetes mellitus with complication, without long-term current use of insulin  (HCC)   CAD (coronary artery disease)   Anxiety   MOESHA SARCHET is a 63 y.o. female with medical history significant for CAD s/p DES to LAD 2018, PAD (s/p left right femoro-femoral bypass 2024, right fourth toe amputation), T2DM, HTN, HLD, anxiety, tobacco use s/p appendectomy 2025 who is admitted with critical limb ischemia of left lower extremity.  Assessment and Plan: Critical limb ischemia of left lower extremity PAD s/p femoro-femoral bypass 04/2023: Patient presenting  with purplish discoloration of left first toe.  CTA shows chronic stenosis/occlusion involving origin of SMA and IMA, chronic occlusion of right common and external iliac stents, and new segmental occlusion of left SFA.  No signs of active infection/gangrenous changes.  Patient is hemodynamically stable.  EDP discussed with vascular surgery who recommended admission to [facility]. - Vascular surgery to consult - Keep n.p.o. after midnight - Continue aspirin , Brilinta , atorvastatin , Zetia  - Xarelto  also noted on home med list, unclear if she is still taking  Coronary artery disease s/p DES LAD 2018: Stable, denies chest pain.  Continue Coreg , atorvastatin , Zetia , aspirin , Brilinta .  Type 2 diabetes: Holding Farxiga .  Placed on SSI.  Hypertension: Continue Coreg  and olmesartan .  Hyperlipidemia: Continue atorvastatin  and Zetia .  Peripheral neuropathy: Continue Lyrica .  Anxiety: Continue home Klonopin  1 mg twice daily.  Tobacco use: Patient reports ongoing tobacco use, has cut down to  less than half a pack per day.  She declines nicotine patch.  Complete smoking cessation advised.   DVT prophylaxis: enoxaparin  (LOVENOX ) injection 40 mg Start: 10/16/24 1000 Code Status: Full code, confirmed with patient on admission Family Communication: Discussed with patient, she has discussed with family Disposition Plan: From home likely discharge to home pending clinical progress Consults called: Vascular surgery Severity of Illness: The appropriate patient status for this patient is INPATIENT. Inpatient status is judged to be reasonable and necessary in order to provide the required intensity of service to ensure the patient's safety. The patient's presenting symptoms, physical exam findings, and initial radiographic and laboratory data in the context of their chronic comorbidities is felt to place them at high risk for further clinical deterioration. Furthermore, it is not anticipated that the patient will be medically stable for discharge from the hospital within 2 midnights of admission.   * I certify that at the point of admission it is my clinical judgment that the patient will require inpatient hospital care spanning beyond 2 midnights from the point of admission due to high intensity of service, high risk for further deterioration and high frequency of surveillance required.DEWAINE Jorie Blanch MD [facility]  If 7PM-7AM, please contact night-coverage [email or web address]  10/15/2024, 8:40 PM

## 2024-10-15 NOTE — ED Provider Notes (Signed)
 [place] EMERGENCY DEPARTMENT AT [facility] Provider Note  CSN: [id number] Arrival date & time: 10/15/24 1552  Chief Complaint(s) Toe Pain  HPI Alice Bennett is a 63 y.o. female history of coronary artery disease, diabetes, peripheral artery disease, presenting to the emergency department with left great toe pain.  Patient reports increasing left great toe pain over the past 2 weeks also having discoloration.  Has been ambulatory.  No wound or specific injury.  Symptoms are progressive.  No fevers or chills.  No leg pain.   Past Medical History Past Medical History:  Diagnosis Date   CAD (coronary artery disease)    07/10/17 PCI/DES to LAD, EF 35-40%   Diabetes mellitus without complication (HCC)    Hyperlipidemia    Hypertension    NSTEMI (non-ST elevated myocardial infarction) [facility])    Patient Active Problem List   Diagnosis Date Noted   Critical limb ischemia of left lower extremity (HCC) 10/15/2024   Anxiety 10/15/2024   CAD (coronary artery disease)    Osteomyelitis of toe of right foot (HCC) 10/27/2023   Type 2 diabetes mellitus with complication, without long-term current use of insulin  (HCC) 10/26/2023   Critical lower limb ischemia (HCC) 04/22/2023   Critical limb ischemia of right lower extremity (HCC) 02/04/2023   Superior mesenteric artery stenosis 02/04/2023   Claudication in peripheral vascular disease 07/26/2022   Peripheral arterial disease 07/07/2022   Tobacco use 06/01/2022   NSTEMI (non-ST elevated myocardial infarction) (HCC) 05/30/2022   Hyperlipidemia associated with type 2 diabetes mellitus (HCC) 07/12/2017   Hypertension associated with diabetes (HCC) 07/12/2017   Non-ST elevation (NSTEMI) myocardial infarction [facility])    Ischemic cardiomyopathy    Unstable angina (HCC) 07/09/2017   Home Medication(s) Prior to Admission medications   Medication Sig Start Date End Date Taking? Authorizing Provider  aspirin  EC 81 MG tablet Take 81 mg by  mouth daily.   Yes [provider]  atorvastatin  (LIPITOR ) 80 MG tablet Take 1 tablet (80 mg total) by mouth daily at 6 PM. 07/13/24  Yes Court Dorn PARAS, MD  BRILINTA  90 MG TABS tablet TAKE 1 TABLET(90 MG) BY MOUTH TWICE DAILY 03/07/24  Yes Serene Gaile ORN, MD  carvedilol  (COREG ) 6.25 MG tablet Take 1 tablet (6.25 mg total) by mouth 2 (two) times daily with a meal. 07/19/17  Yes Burnard Debby LABOR, MD  Cholecalciferol  (D3 HIGH POTENCY) 125 MCG (5000 UT) capsule Take 5,000 Units by mouth daily.   Yes [provider]  clonazePAM  (KLONOPIN ) 1 MG tablet Take 2 mg by mouth at bedtime.   Yes [provider]  ezetimibe  (ZETIA ) 10 MG tablet Take 1 tablet (10 mg total) by mouth daily. 07/13/24  Yes Court Dorn PARAS, MD  olmesartan  (BENICAR ) 20 MG tablet Take 1 tablet (20 mg total) by mouth daily. 03/09/21  Yes Burnard Debby LABOR, MD  OZEMPIC, 2 MG/DOSE, 8 MG/3ML SOPN Inject 2 mg into the skin every Sunday. 02/01/23  Yes [provider]  sertraline  (ZOLOFT ) 100 MG tablet Take 100 mg by mouth daily.   Yes [provider]  FARXIGA  10 MG TABS tablet Take 10 mg by mouth daily.  03/28/19   [provider]  pantoprazole  (PROTONIX ) 40 MG tablet Take 1 tablet (40 mg total) by mouth daily. 07/19/17   Burnard Debby LABOR, MD  pregabalin  (LYRICA ) 150 MG capsule Take 150 mg by mouth in the morning, at noon, and at bedtime.    [provider]  XARELTO  20  MG TABS tablet Take 20 mg by mouth daily. 05/25/23   [provider]                                                                                                                                    Past Surgical History Past Surgical History:  Procedure Laterality Date   ABDOMINAL AORTOGRAM W/LOWER EXTREMITY N/A 07/26/2022   Procedure: ABDOMINAL AORTOGRAM W/LOWER EXTREMITY;  Surgeon: Court Dorn PARAS, MD;  Location: [place] INVASIVE CV LAB;  Service: Cardiovascular;  Laterality: N/A;   ABDOMINAL AORTOGRAM W/LOWER  EXTREMITY N/A 04/22/2023   Procedure: ABDOMINAL AORTOGRAM W/LOWER EXTREMITY;  Surgeon: Magda Debby SAILOR, MD;  Location: [facility] INVASIVE CV LAB;  Service: Cardiovascular;  Laterality: N/A;   AMPUTATION TOE Right 10/27/2023   Procedure: AMPUTATION TOE;  Surgeon: Malvin Marsa FALCON, DPM;  Location: [facility] ORS;  Service: Orthopedics/Podiatry;  Laterality: Right;  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   AORTOGRAM Bilateral 02/05/2023   Procedure: AORTOGRAM WITH RUN OFF;  Surgeon: Serene Gaile ORN, MD;  Location: [facility] OR;  Service: Vascular;  Laterality: Bilateral;   APPLICATION OF WOUND VAC Bilateral 05/04/2023   Procedure: APPLICATION OF WOUND VAC;  Surgeon: Serene Gaile ORN, MD;  Location: [facility] OR;  Service: Vascular;  Laterality: Bilateral;   CORONARY PRESSURE/FFR STUDY N/A 07/10/2017   Procedure: INTRAVASCULAR PRESSURE WIRE/FFR STUDY;  Surgeon: Jordan, Peter M, MD;  Location: [facility] INVASIVE CV LAB;  Service: Cardiovascular;  Laterality: N/A;   CORONARY PRESSURE/FFR STUDY N/A 05/31/2022   Procedure: INTRAVASCULAR PRESSURE WIRE/FFR STUDY;  Surgeon: Mady Bruckner, MD;  Location: [facility] INVASIVE CV LAB;  Service: Cardiovascular;  Laterality: N/A;   CORONARY STENT INTERVENTION N/A 07/10/2017   Procedure: CORONARY STENT INTERVENTION;  Surgeon: Jordan, Peter M, MD;  Location: [facility] INVASIVE CV LAB;  Service: Cardiovascular;  Laterality: N/A;   CORONARY STENT INTERVENTION N/A 05/31/2022   Procedure: CORONARY STENT INTERVENTION;  Surgeon: Mady Bruckner, MD;  Location: [facility] INVASIVE CV LAB;  Service: Cardiovascular;  Laterality: N/A;   FEMORAL-FEMORAL BYPASS GRAFT Bilateral 05/04/2023   Procedure: LEFT TO RIGHT FEMORAL-FEMORAL ARTERY BYPASS;  Surgeon: Serene Gaile ORN, MD;  Location: [facility] OR;  Service: Vascular;  Laterality: Bilateral;   INSERTION OF ILIAC STENT Right 02/05/2023   Procedure: INSERTION OF RIGHT ILIAC STENT;  Surgeon: Serene Gaile ORN, MD;  Location: [facility] OR;  Service: Vascular;  Laterality: Right;   LEFT HEART CATH AND  CORONARY ANGIOGRAPHY N/A 07/10/2017   Procedure: LEFT HEART CATH AND CORONARY ANGIOGRAPHY;  Surgeon: Jordan, Peter M, MD;  Location: [facility] INVASIVE CV LAB;  Service: Cardiovascular;  Laterality: N/A;   LOWER EXTREMITY ANGIOGRAM N/A 02/05/2023   Procedure: RIGHT LOWER EXTREMITY ANGIOGRAM;  Surgeon: Serene Gaile ORN, MD;  Location: [facility];  Service: Vascular;  Laterality: N/A;   PERIPHERAL VASCULAR INTERVENTION  07/26/2022   Procedure: PERIPHERAL VASCULAR INTERVENTION;  Surgeon: Court Dorn PARAS, MD;  Location: [facility] LAB;  Service: Cardiovascular;;  PERIPHERAL VASCULAR INTERVENTION  04/22/2023   Procedure: PERIPHERAL VASCULAR INTERVENTION;  Surgeon: Magda Debby SAILOR, MD;  Location: [facility]VAS[facility] CV LAB;  Service: Cardiovascular;;  right common and ecternal iliac, right common femoral   RIGHT/LEFT HEART CATH AND CORONARY ANGIOGRAPHY N/A 05/31/2022   Procedure: RIGHT/LEFT HEART CATH AND CORONARY ANGIOGRAPHY;  Surgeon: Mady Bruckner, MD;  Location: [facility] INV[facility] CV LAB;  Service: Cardiovascular;  Laterality: N/A;   ULTRASOUND GUIDANCE FOR VASCULAR ACCESS Bilateral 02/05/2023   Procedure: ULTRASOUND GUIDANCE FOR VASCULAR ACCESS;  Surgeon: Serene Gaile ORN, MD;  Location: [facility] OR;  Service: Vascular;  Laterality: Bilateral;   Family History No family history on file.  Social History Social History   Tobacco Use   Smoking status: Former    Current packs/day: 0.00    Average packs/day: 0.5 packs/day for 10.0 years (5.0 ttl pk-yrs)    Types: Cigarettes    Quit date: 04/2023    Years since quitting: 1.5    Passive exposure: Current   Smokeless tobacco: Never  Vaping Use   Vaping status: Never Used  Substance Use Topics   Alcohol use: No   Drug use: No   Allergies Codeine  Review of Systems Review of Systems  All other systems reviewed and are negative.   Physical Exam Vital Signs  I have reviewed the triage vital signs BP (!) 189/89 (BP Location: Right Arm)   Pulse 64   Temp 97.8 F  (36.6 C) (Oral)   Resp 16   SpO2 95%  Physical Exam Vitals and nursing note reviewed.  Constitutional:      General: She is not in acute distress.    Appearance: She is well-developed.  HENT:     Head: Normocephalic and atraumatic.     Mouth/Throat:     Mouth: Mucous membranes are moist.  Eyes:     Pupils: Pupils are equal, round, and reactive to light.  Cardiovascular:     Rate and Rhythm: Normal rate and regular rhythm.     Heart sounds: No murmur heard.    Comments: Nonpalpable but dopplerable left PT pulse Pulmonary:     Effort: Pulmonary effort is normal. No respiratory distress.     Breath sounds: Normal breath sounds.  Abdominal:     General: Abdomen is flat.     Palpations: Abdomen is soft.     Tenderness: There is no abdominal tenderness.  Musculoskeletal:        General: No tenderness.     Right lower leg: No edema.     Left lower leg: No edema.     Comments: Left great toe with purplish discoloration.  Foot warm with less than 2-second capillary refill  Skin:    General: Skin is warm and dry.  Neurological:     General: No focal deficit present.     Mental Status: She is alert. Mental status is at baseline.  Psychiatric:        Mood and Affect: Mood normal.        Behavior: Behavior normal.     ED Results and Treatments Labs (all labs ordered are listed, but only abnormal results are displayed) Labs Reviewed  BASIC METABOLIC PANEL WITH GFR - Abnormal; Notable for the following components:      Result Value   Glucose, Bld 142 (*)    All other components within normal limits  CBC WITH DIFFERENTIAL/PLATELET  HIV ANTIBODY (ROUTINE TESTING W REFLEX)  CBC  BASIC METABOLIC PANEL WITH GFR  HEMOGLOBIN A1C  Radiology CT Angio Aortobifemoral W and/or Wo Contrast Result Date: 10/15/2024 CLINICAL DATA:  Left great toe pain for 2 weeks,  erythema EXAM: CT ANGIOGRAPHY OF ABDOMINAL AORTA WITH ILIOFEMORAL RUNOFF TECHNIQUE: Multidetector CT imaging of the abdomen, pelvis and lower extremities was performed using the standard protocol during bolus administration of intravenous contrast. Multiplanar CT image reconstructions and MIPs were obtained to evaluate the vascular anatomy. RADIATION DOSE REDUCTION: This exam was performed according to the departmental dose-optimization program which includes automated exposure control, adjustment of the mA and/or kV according to patient size and/or use of iterative reconstruction technique. CONTRAST:  OMNIPAQUE  IOHEXOL  350 MG/ML SOLN COMPARISON:  04/29/2023 FINDINGS: VASCULAR Aorta: Normal caliber aorta without aneurysm, dissection, vasculitis or significant stenosis. Moderate atheromatous plaque throughout the abdominal aorta. Celiac: Patent without evidence of aneurysm, dissection, vasculitis or significant stenosis. SMA: There stenosis at the origin of the SMA, extending for approximately 3.5 cm from the origin, estimated 70-90% stenosis. The more distal SMA is widely patent. No aneurysm, dissection, or vasculitis. Renals: Both renal arteries are patent without evidence of aneurysm, dissection, vasculitis, fibromuscular dysplasia or significant stenosis. Atherosclerosis at the origin of the left renal artery with less than 50% stenosis. IMA: There is occlusion of the origin of the IMA, with distal reconstitution beyond the first order branches via collaterals from the SMA distribution. RIGHT Lower Extremity Inflow: Vascular stents are seen throughout the right common iliac and external iliac distribution, with chronic occlusion of the right common iliac and external iliac arteries. Diminutive patent right internal iliac artery opacifies via collateral vessels. Outflow: Normal opacification of a femoral-femoral bypass graft. Common femoral artery opacifies normally. The superficial femoral and profundus  femoral arteries are diminutive but widely patent. Mild atherosclerosis of the popliteal artery without critical stenosis. Runoff: The anterior tibial artery is only identified and its proximal extent, and is not well visualized beyond the proximal calf. Posterior tibial and peroneal arteries opacify normally through the level of the ankle. There is distal reconstitution of the anterior tibial artery at the level ankle via collaterals. LEFT Lower Extremity Inflow: There is atherosclerosis within the proximal left common iliac artery extending approximately 3 cm from its origin, with mild stenosis approaching 50%. Distal left common iliac, external iliac, and internal iliac arteries are patent. Outflow: Patent femoral-femoral bypass graft is identified. The left superficial femoral and profundus femoral arteries are somewhat diminutive, but are patent at their origins. There is segmental occlusion of the left SFA within the mid thigh, extending approximately 8 cm in length. There is distal reconstitution of the SMA as it exits Hunter's canal via collateral vessels from the profundus femoral. The left popliteal artery is patent. Runoff: The anterior tibial artery is identified at its origin, but is otherwise not well visualized throughout the remainder of its course. The peroneal and posterior tibial arteries opacify normally to the level of the ankle. Veins: No obvious venous abnormality within the limitations of this arterial phase study. Review of the MIP images confirms the above findings. NON-VASCULAR Lower chest: No acute pleural or parenchymal lung disease. Hepatobiliary: No focal liver abnormality is seen. No gallstones, gallbladder wall thickening, or biliary dilatation. Pancreas: Unremarkable. No pancreatic ductal dilatation or surrounding inflammatory changes. Spleen: Normal in size without focal abnormality. Adrenals/Urinary Tract: Stable 1.9 cm left adrenal adenoma. The right adrenal is unremarkable.  Kidneys enhance normally. No urinary tract calculi or obstructive uropathy within either kidney. The bladder is unremarkable. Stomach/Bowel: No bowel obstruction or ileus. Prior appendectomy. No bowel  wall thickening or inflammatory change. Lymphatic: No pathologic adenopathy within the abdomen or pelvis. Reproductive: Uterus and bilateral adnexa are unremarkable. Other: No free fluid or free intraperitoneal gas. No abdominal wall hernia. Musculoskeletal: No acute or destructive bony abnormalities. Prior right fourth digit amputation. Reconstructed images demonstrate no additional findings. IMPRESSION: VASCULAR 1. Stable atheromatous plaque throughout the abdominal aorta, without significant stenosis. 2. 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 3. 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 4. Patent femoral-femoral bypass graft. Right lower extremity: 1. 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 2. Diminutive but patent right SFA and popliteal arteries. 3. Patent 2 vessel runoff to the ankle, with occlusion of the anterior tibial artery beyond its origin. Left lower extremity: 1. New segmental occlusion of the left SFA within the mid thigh, extending approximately 8 cm in length. Distal reconstitution via collaterals from the profundus femoral system. 2. Patent 2 vessel runoff to the ankle, with occlusion of the anterior tibial artery beyond its origin. NON-VASCULAR 1. No acute intra-abdominal or intrapelvic process. 2. Stable left adrenal adenoma. Electronically Signed   By: Ozell Daring M.D.   On: 10/15/2024 18:39   DG Foot Complete Left Result Date: 10/15/2024 CLINICAL DATA:  Pain and discoloration of the great toe EXAM: LEFT FOOT - COMPLETE 3+ VIEW COMPARISON:  None Available. FINDINGS: There is no evidence of fracture or dislocation. There is no evidence of arthropathy or other focal bone  abnormality. Soft tissues are unremarkable. IMPRESSION: Negative. Electronically Signed   By: Luke Bun M.D.   On: 10/15/2024 17:19    Pertinent labs & imaging results that were available during my care of the patient were reviewed by me and considered in my medical decision making (see MDM for details).  Medications Ordered in ED Medications  enoxaparin  (LOVENOX ) injection 40 mg (has no administration in time range)  acetaminophen  (TYLENOL ) tablet 650 mg (has no administration in time range)    Or  acetaminophen  (TYLENOL ) suppository 650 mg (has no administration in time range)  HYDROcodone -acetaminophen  (NORCO/VICODIN) 5-325 MG per tablet 1-2 tablet (has no administration in time range)  ondansetron  (ZOFRAN ) tablet 4 mg (has no administration in time range)    Or  ondansetron  (ZOFRAN ) injection 4 mg (has no administration in time range)  senna-docusate (Senokot-S) tablet 1 tablet (has no administration in time range)  bisacodyl  (DULCOLAX) EC tablet 5 mg (has no administration in time range)  aspirin  EC tablet 81 mg (has no administration in time range)  atorvastatin  (LIPITOR ) tablet 80 mg (has no administration in time range)  carvedilol  (COREG ) tablet 6.25 mg (has no administration in time range)  ezetimibe  (ZETIA ) tablet 10 mg (has no administration in time range)  irbesartan  (AVAPRO ) tablet 150 mg (has no administration in time range)  pantoprazole  (PROTONIX ) EC tablet 40 mg (has no administration in time range)  ticagrelor  (BRILINTA ) tablet 90 mg (has no administration in time range)  clonazePAM  (KLONOPIN ) tablet 1 mg (has no administration in time range)  pregabalin  (LYRICA ) capsule 150 mg (has no administration in time range)  insulin  aspart (novoLOG ) injection 0-9 Units (has no administration in time range)  insulin  aspart (novoLOG ) injection 0-5 Units (has no administration in time range)  oxyCODONE -acetaminophen  (PERCOCET/ROXICET) 5-325 MG per tablet 1 tablet (1 tablet Oral  Given 10/15/24 1646)  iohexol  (OMNIPAQUE ) 350 MG/ML injection 100 mL (100 mLs Intravenous Contrast Given 10/15/24 1757)  Procedures Procedures  (including critical care time)  Medical Decision Making / ED Course   MDM:  63 year old presenting to the emergency department with toe discoloration.  Suspect most likely cause is vascular in nature.  Denies trauma but will obtain x-ray.  Lower concern for diabetic would not, there is no wound or history of wound.  Does not have palpable pulse but PT pulses dopplerable.  Been worsening discoloration will obtain CT to evaluate for any acute occlusion.  Differential also includes other process such as microvascular change.  Will reassess.  Clinical Course as of 10/15/24 2113  Mon Oct 15, 2024  2112 CT scan shows new left SFA thrombus, approximately 8 cm with distal reconstitution.  Discussed with Dr. Lanis with vascular surgery, given patient has been having symptoms for 2 weeks does not think heparin  is indicated but would recommend admission for angiogram tomorrow.  Discussed with Dr. Tobie who will admit.  Discussed with patient who is agreeable. [WS]    Clinical Course User Index [WS] Francesca Elsie CROME, MD     Additional history obtained: -Additional history obtained from family -External records from outside source obtained and reviewed including: Chart review including previous notes, labs, imaging, consultation notes including prior vascular notes    Lab Tests: -I ordered, reviewed, and interpreted labs.   The pertinent results include:   Labs Reviewed  BASIC METABOLIC PANEL WITH GFR - Abnormal; Notable for the following components:      Result Value   Glucose, Bld 142 (*)    All other components within normal limits  CBC WITH DIFFERENTIAL/PLATELET  HIV ANTIBODY (ROUTINE TESTING W REFLEX)  CBC   BASIC METABOLIC PANEL WITH GFR  HEMOGLOBIN A1C    Notable for normal BMP and CBC   Imaging Studies ordered: I ordered imaging studies including CT angio On my interpretation imaging demonstrates SFA thrombus  I independently visualized and interpreted imaging. I agree with the radiologist interpretation   Medicines ordered and prescription drug management: Meds ordered this encounter  Medications   oxyCODONE -acetaminophen  (PERCOCET/ROXICET) 5-325 MG per tablet 1 tablet    Refill:  0   iohexol  (OMNIPAQUE ) 350 MG/ML injection 100 mL   enoxaparin  (LOVENOX ) injection 40 mg   OR Linked Order Group    acetaminophen  (TYLENOL ) tablet 650 mg    acetaminophen  (TYLENOL ) suppository 650 mg   HYDROcodone -acetaminophen  (NORCO/VICODIN) 5-325 MG per tablet 1-2 tablet   OR Linked Order Group    ondansetron  (ZOFRAN ) tablet 4 mg    ondansetron  (ZOFRAN ) injection 4 mg   senna-docusate (Senokot-S) tablet 1 tablet   bisacodyl  (DULCOLAX) EC tablet 5 mg   aspirin  EC tablet 81 mg   atorvastatin  (LIPITOR ) tablet 80 mg   carvedilol  (COREG ) tablet 6.25 mg   ezetimibe  (ZETIA ) tablet 10 mg   irbesartan  (AVAPRO ) tablet 150 mg   pantoprazole  (PROTONIX ) EC tablet 40 mg   ticagrelor  (BRILINTA ) tablet 90 mg   clonazePAM  (KLONOPIN ) tablet 1 mg   pregabalin  (LYRICA ) capsule 150 mg   insulin  aspart (novoLOG ) injection 0-9 Units    Correction coverage::   Sensitive (thin, NPO, renal)    CBG < 70::   Implement Hypoglycemia Standing Orders and refer to Hypoglycemia Standing Orders sidebar report    CBG 70 - 120::   0 units    CBG 121 - 150::   1 unit    CBG 151 - 200::   2 units    CBG 201 - 250::   3 units    CBG 251 -  300::   5 units    CBG 301 - 350::   7 units    CBG 351 - 400:   9 units    CBG > 400:   call MD and obtain STAT lab verification   insulin  aspart (novoLOG ) injection 0-5 Units    Correction coverage::   HS scale    CBG < 70::   Implement Hypoglycemia Standing Orders and refer to  Hypoglycemia Standing Orders sidebar report    CBG 70 - 120::   0 units    CBG 121 - 150::   0 units    CBG 151 - 200::   0 units    CBG 201 - 250::   2 units    CBG 251 - 300::   3 units    CBG 301 - 350::   4 units    CBG 351 - 400::   5 units    CBG > 400:   call MD and obtain STAT lab verification    -I have reviewed the patients home medicines and have made adjustments as needed   Consultations Obtained: I requested consultation with the vascular surgeon,  and discussed lab and imaging findings as well as pertinent plan - they recommend: admission   Cardiac Monitoring: The patient was maintained on a cardiac monitor.  I personally viewed and interpreted the cardiac monitored which showed an underlying rhythm of: NSR  Social Determinants of Health:  Diagnosis or treatment significantly limited by social determinants of health: former smoker   Reevaluation: After the interventions noted above, I reevaluated the patient and found that their symptoms have improved  Co morbidities that complicate the patient evaluation  Past Medical History:  Diagnosis Date   CAD (coronary artery disease)    07/10/17 PCI/DES to LAD, EF 35-40%   Diabetes mellitus without complication (HCC)    Hyperlipidemia    Hypertension    NSTEMI (non-ST elevated myocardial infarction) (HCC)       Dispostion: Disposition decision including need for hospitalization was considered, and patient admitted to the hospital.    Final Clinical Impression(s) / ED Diagnoses Final diagnoses:  Critical limb ischemia of left lower extremity (HCC)     This chart was dictated using voice recognition software.  Despite best efforts to proofread,  errors can occur which can change the documentation meaning.    Francesca Elsie CROME, MD 10/15/24 2113

## 2024-10-15 NOTE — ED Triage Notes (Signed)
 Patient c/o left big toe pain x 2 weeks. Patient report increase pain and redness on the affected toe today. Patient states she thought its the boots the she always wear that might cause the pain.

## 2024-10-16 ENCOUNTER — Other Ambulatory Visit: Payer: Self-pay

## 2024-10-16 ENCOUNTER — Encounter (HOSPITAL_COMMUNITY): Admission: EM | Disposition: A | Payer: Self-pay | Source: Home / Self Care | Attending: Internal Medicine

## 2024-10-16 ENCOUNTER — Encounter (HOSPITAL_COMMUNITY): Payer: Self-pay | Admitting: Internal Medicine

## 2024-10-16 HISTORY — PX: LOWER EXTREMITY ANGIOGRAPHY: CATH118251

## 2024-10-16 LAB — MRSA NEXT GEN BY PCR, NASAL: MRSA by PCR Next Gen: NOT DETECTED

## 2024-10-16 LAB — CBC
HCT: 40.2 % (ref 36.0–46.0)
HCT: 43.7 % (ref 36.0–46.0)
HCT: 40.2 % (ref 36.0–46.0)
Hemoglobin: 13 g/dL (ref 12.0–15.0)
Hemoglobin: 14.4 g/dL (ref 12.0–15.0)
Hemoglobin: 13.4 g/dL (ref 12.0–15.0)
MCH: 29.2 pg (ref 26.0–34.0)
MCH: 29.4 pg (ref 26.0–34.0)
MCH: 29 pg (ref 26.0–34.0)
MCHC: 32.3 g/dL (ref 30.0–36.0)
MCHC: 33 g/dL (ref 30.0–36.0)
MCHC: 33.3 g/dL (ref 30.0–36.0)
MCV: 90.3 fL (ref 80.0–100.0)
MCV: 89.4 fL (ref 80.0–100.0)
MCV: 87 fL (ref 80.0–100.0)
Platelets: 174 K/uL (ref 150–400)
Platelets: 166 K/uL (ref 150–400)
Platelets: 161 K/uL (ref 150–400)
RBC: 4.45 MIL/uL (ref 3.87–5.11)
RBC: 4.89 MIL/uL (ref 3.87–5.11)
RBC: 4.62 MIL/uL (ref 3.87–5.11)
RDW: 15.5 % (ref 11.5–15.5)
RDW: 15.4 % (ref 11.5–15.5)
RDW: 15.5 % (ref 11.5–15.5)
WBC: 6 K/uL (ref 4.0–10.5)
WBC: 7.8 K/uL (ref 4.0–10.5)
WBC: 7.2 K/uL (ref 4.0–10.5)
nRBC: 0 % (ref 0.0–0.2)
nRBC: 0 % (ref 0.0–0.2)
nRBC: 0 % (ref 0.0–0.2)

## 2024-10-16 LAB — BASIC METABOLIC PANEL WITH GFR
Anion gap: 9 (ref 5–15)
BUN: 7 mg/dL — ABNORMAL LOW (ref 8–23)
CO2: 27 mmol/L (ref 22–32)
Calcium: 8.3 mg/dL — ABNORMAL LOW (ref 8.9–10.3)
Chloride: 104 mmol/L (ref 98–111)
Creatinine, Ser: 0.86 mg/dL (ref 0.44–1.00)
GFR, Estimated: >60 mL/min (ref >60)
Glucose, Bld: 152 mg/dL — ABNORMAL HIGH (ref 70–99)
Potassium: 3.2 mmol/L — ABNORMAL LOW (ref 3.5–5.1)
Sodium: 140 mmol/L (ref 135–145)

## 2024-10-16 LAB — HIV ANTIBODY (ROUTINE TESTING W REFLEX): HIV Screen 4th Generation wRfx: NONREACTIVE

## 2024-10-16 LAB — HEMOGLOBIN A1C
Hgb A1c MFr Bld: 7 % — ABNORMAL HIGH (ref 4.8–5.6)
Mean Plasma Glucose: 154.2 mg/dL

## 2024-10-16 LAB — GLUCOSE, CAPILLARY
Glucose-Capillary: 140 mg/dL — ABNORMAL HIGH (ref 70–99)
Glucose-Capillary: 96 mg/dL (ref 70–99)
Glucose-Capillary: 129 mg/dL — ABNORMAL HIGH (ref 70–99)

## 2024-10-16 LAB — CBG MONITORING, ED: Glucose-Capillary: 178 mg/dL — ABNORMAL HIGH (ref 70–99)

## 2024-10-16 LAB — HEPARIN LEVEL (UNFRACTIONATED)
Heparin Unfractionated: 1.05 [IU]/mL — ABNORMAL HIGH (ref 0.30–0.70)
Heparin Unfractionated: <0.1 [IU]/mL — ABNORMAL LOW (ref 0.30–0.70)

## 2024-10-16 LAB — FIBRINOGEN
Fibrinogen: 528 mg/dL — ABNORMAL HIGH (ref 210–475)
Fibrinogen: 454 mg/dL (ref 210–475)

## 2024-10-16 MED ORDER — LIDOCAINE HCL (PF) 1 % IJ SOLN
INTRAMUSCULAR | Status: DC | PRN
  Administered 2024-10-16: 15 mL

## 2024-10-16 MED ORDER — POTASSIUM CHLORIDE 10 MEQ/100ML IV SOLN
10.0000 meq | INTRAVENOUS | Status: AC
  Administered 2024-10-16 (×3): 10 meq via INTRAVENOUS
  Filled 2024-10-16 (×3): qty 100

## 2024-10-16 MED ORDER — HYDRALAZINE HCL 20 MG/ML IJ SOLN
INTRAMUSCULAR | Status: DC | PRN
  Administered 2024-10-16: 10 mg via INTRAVENOUS

## 2024-10-16 MED ORDER — HEPARIN SODIUM (PORCINE) 1000 UNIT/ML IJ SOLN
INTRAMUSCULAR | Status: AC
  Filled 2024-10-16: qty 10

## 2024-10-16 MED ORDER — SODIUM CHLORIDE 0.9% FLUSH
3.0000 mL | Freq: Two times a day (BID) | INTRAVENOUS | Status: DC
  Administered 2024-10-16 – 2024-10-19 (×5): 3 mL via INTRAVENOUS

## 2024-10-16 MED ORDER — ORAL CARE MOUTH RINSE: 15.0000 mL | OROMUCOSAL | Status: DC | PRN

## 2024-10-16 MED ORDER — MIDAZOLAM HCL (PF) 2 MG/2ML IJ SOLN
INTRAMUSCULAR | Status: DC | PRN
  Administered 2024-10-16: 2 mg via INTRAVENOUS

## 2024-10-16 MED ORDER — HEPARIN (PORCINE) 25000 UT/250ML-% IV SOLN
800.0000 [IU]/h | INTRAVENOUS | Status: DC
  Administered 2024-10-16: 800 [IU]/h via INTRAVENOUS
  Filled 2024-10-16: qty 250

## 2024-10-16 MED ORDER — HEPARIN (PORCINE) IN NACL 1000-0.9 UT/500ML-% IV SOLN
INTRAVENOUS | Status: DC | PRN
  Administered 2024-10-16 (×2): 500 mL

## 2024-10-16 MED ORDER — MIDAZOLAM HCL 2 MG/2ML IJ SOLN
INTRAMUSCULAR | Status: AC
  Filled 2024-10-16: qty 2

## 2024-10-16 MED ORDER — LIDOCAINE HCL (PF) 1 % IJ SOLN
INTRAMUSCULAR | Status: AC
  Filled 2024-10-16: qty 30

## 2024-10-16 MED ORDER — FENTANYL CITRATE (PF) 100 MCG/2ML IJ SOLN
INTRAMUSCULAR | Status: DC | PRN
  Administered 2024-10-16: 50 ug via INTRAVENOUS

## 2024-10-16 MED ORDER — CHLORHEXIDINE GLUCONATE CLOTH 2 % EX PADS
6.0000 | MEDICATED_PAD | Freq: Every day | CUTANEOUS | Status: DC
  Administered 2024-10-16 – 2024-10-19 (×4): 6 via TOPICAL

## 2024-10-16 MED ORDER — HEPARIN SODIUM (PORCINE) 1000 UNIT/ML IJ SOLN
INTRAMUSCULAR | Status: DC | PRN
  Administered 2024-10-16: 10000 [IU] via INTRAVENOUS

## 2024-10-16 MED ORDER — MIDAZOLAM HCL (PF) 2 MG/2ML IJ SOLN: 1.0000 mg | INTRAMUSCULAR | Status: DC | PRN

## 2024-10-16 MED ORDER — SODIUM CHLORIDE 0.9 % IV SOLN
1.0000 mg/h | INTRAVENOUS | Status: DC
  Administered 2024-10-16 – 2024-10-17 (×3): 1 mg/h
  Filled 2024-10-16 (×5): qty 10

## 2024-10-16 MED ORDER — SODIUM CHLORIDE 0.9 % IV SOLN: 250.0000 mL | INTRAVENOUS | Status: AC | PRN

## 2024-10-16 MED ORDER — SODIUM CHLORIDE 0.9% FLUSH: 3.0000 mL | INTRAVENOUS | Status: DC | PRN

## 2024-10-16 MED ORDER — HYDRALAZINE HCL 20 MG/ML IJ SOLN
10.0000 mg | Freq: Four times a day (QID) | INTRAMUSCULAR | Status: DC | PRN
  Administered 2024-10-16 – 2024-10-17 (×2): 10 mg via INTRAVENOUS
  Filled 2024-10-16 (×2): qty 1

## 2024-10-16 MED ORDER — HYDRALAZINE HCL 20 MG/ML IJ SOLN
INTRAMUSCULAR | Status: AC
  Filled 2024-10-16: qty 1

## 2024-10-16 MED ORDER — SODIUM CHLORIDE 0.9 % IV SOLN: INTRAVENOUS | Status: DC

## 2024-10-16 MED ORDER — FENTANYL CITRATE (PF) 100 MCG/2ML IJ SOLN
INTRAMUSCULAR | Status: AC
  Filled 2024-10-16: qty 2

## 2024-10-16 MED ORDER — HYALURONIDASE HUMAN 150 UNIT/ML IJ SOLN: 150.0000 [IU] | Freq: Once | INTRAMUSCULAR | Status: DC

## 2024-10-16 NOTE — Progress Notes (Signed)
 PHARMACY - ANTICOAGULATION CONSULT NOTE  Pharmacy Consult for heparin   Indication: VTE Treatment  Allergies  Allergen Reactions   Codeine Itching    Patient Measurements: Height: 5' 9 (175.3 cm) Weight: 102.9 kg (226 lb 13.7 oz) IBW/kg (Calculated) : 66.2 HEPARIN  DW (KG): 88.8  Vital Signs: Temp: 98.2 F (36.8 C) (12/09 2344) Temp Source: Axillary (12/09 2344) BP: 141/51 (12/09 2300) Pulse Rate: 65 (12/09 2300)  Labs: Recent Labs    10/15/24 1700 10/16/24 0453 10/16/24 1821 10/16/24 2302  HGB 14.3 13.0 14.4 13.4  HCT 44.3 40.2 43.7 40.2  PLT 197 174 166 161  HEPARINUNFRC  --   --  1.05* <0.10*  CREATININE 0.91 0.86  --   --     Estimated Creatinine Clearance: 85.5 mL/min (by C-G formula based on SCr of 0.86 mg/dL).   Medical History: Past Medical History:  Diagnosis Date   CAD (coronary artery disease)    07/10/17 PCI/DES to LAD, EF 35-40%   Diabetes mellitus without complication (HCC)    Hyperlipidemia    Hypertension    NSTEMI (non-ST elevated myocardial infarction) [facility])     Assessment: Patient here with SFA occlusion s/p U/S guided access, femoral-femoral bypass, L leg angiogram, selective injection, L popliteal artery, and placement of lysis catheter with vascular surgery on 12/9. Patient is also receiving 1 mg/hr of alteplase  via R femoral sheath (received 10,000 units of heparin  during procedure).   AM: Heparin  level now undetectable on 650 units/hr (~4h after heparin  decreased). Per RN, no issues with the heparin  infusion running continuously, no signs/symptoms of bleeding. Currently has alteplase  running at 1mg /hr. CBC stable, fibrinogen  454  Goal of Therapy:  Heparin  level 0.2-0.5 units/ml Monitor platelets by anticoagulation protocol: Yes   Plan:  Increase heparin  infusion to 800 units/hr Check anti-Xa level in 6 hours and daily while on heparin  Continue to monitor H&H and platelets  Lynwood Poplar, PharmD, BCPS Clinical Pharmacist 10/16/2024  11:50 PM

## 2024-10-16 NOTE — Interval H&P Note (Signed)
 History and Physical Interval Note:  10/16/2024 3:10 PM  Alice Bennett  has presented today for surgery, with the diagnosis of ichemia with tissue loss.  The various methods of treatment have been discussed with the patient and family. After consideration of risks, benefits and other options for treatment, the patient has consented to  Procedure(s): Lower Extremity Angiography (N/A) as a surgical intervention.  The patient's history has been reviewed, patient examined, no change in status, stable for surgery.  I have reviewed the patient's chart and labs.  Questions were answered to the patient's satisfaction.     Malvina New

## 2024-10-16 NOTE — ED Notes (Signed)
 Patient ambulatory to the bathroom.

## 2024-10-16 NOTE — Progress Notes (Signed)
 Dr. Drusilla at bedside order to give aspirin  PO and brilinta  PO. Hold other oral meds. Also, to notify Dr. Pearline of patient's arrival. Alice Bennett

## 2024-10-16 NOTE — Progress Notes (Addendum)
 PHARMACY - ANTICOAGULATION CONSULT NOTE  Pharmacy Consult for heparin   Indication: VTE Treatment  Allergies  Allergen Reactions   Codeine Itching    Patient Measurements: Height: 5' 9 (175.3 cm) Weight: 102.9 kg (226 lb 13.7 oz) IBW/kg (Calculated) : 66.2 HEPARIN  DW (KG): 88.8  Vital Signs: Temp: 99.2 F (37.3 C) (12/09 0924) Temp Source: Oral (12/09 0924) BP: 142/59 (12/09 0924) Pulse Rate: 70 (12/09 0924)  Labs: Recent Labs    10/15/24 1700 10/16/24 0453  HGB 14.3 13.0  HCT 44.3 40.2  PLT 197 174  CREATININE 0.91 0.86    Estimated Creatinine Clearance: 85.5 mL/min (by C-G formula based on SCr of 0.86 mg/dL).   Medical History: Past Medical History:  Diagnosis Date   CAD (coronary artery disease)    07/10/17 PCI/DES to LAD, EF 35-40%   Diabetes mellitus without complication (HCC)    Hyperlipidemia    Hypertension    NSTEMI (non-ST elevated myocardial infarction) (HCC)     Medications:  Medications Prior to Admission  Medication Sig Dispense Refill Last Dose/Taking   aspirin  EC 81 MG tablet Take 81 mg by mouth daily.   10/14/2024 at  8:00 AM   atorvastatin  (LIPITOR ) 80 MG tablet Take 1 tablet (80 mg total) by mouth daily at 6 PM. 90 tablet 0 10/14/2024 Evening   BRILINTA  90 MG TABS tablet TAKE 1 TABLET(90 MG) BY MOUTH TWICE DAILY (Patient taking differently: Take 90 mg by mouth in the morning and at bedtime.) 60 tablet 2 10/14/2024 at  8:00 PM   carvedilol  (COREG ) 6.25 MG tablet Take 1 tablet (6.25 mg total) by mouth 2 (two) times daily with a meal. 60 tablet 0 10/14/2024 at  8:00 PM   Cholecalciferol  (D3 HIGH POTENCY) 125 MCG (5000 UT) capsule Take 5,000 Units by mouth daily.   10/14/2024 Morning   clonazePAM  (KLONOPIN ) 1 MG tablet Take 2 mg by mouth at bedtime.   10/14/2024 Bedtime   ezetimibe  (ZETIA ) 10 MG tablet Take 1 tablet (10 mg total) by mouth daily. 90 tablet 1 10/14/2024 Morning   FARXIGA  10 MG TABS tablet Take 10 mg by mouth daily.    10/14/2024 Morning    fluticasone (FLONASE) 50 MCG/ACT nasal spray Place 1 spray into both nostrils 2 (two) times daily as needed for allergies or rhinitis.   Past Month   nitroGLYCERIN  (NITROSTAT ) 0.4 MG SL tablet Place 0.4 mg under the tongue every 5 (five) minutes as needed for chest pain.   Unknown   olmesartan  (BENICAR ) 20 MG tablet Take 1 tablet (20 mg total) by mouth daily. 90 tablet 3 10/14/2024 Morning   OZEMPIC, 2 MG/DOSE, 8 MG/3ML SOPN Inject 2 mg into the skin every Sunday.   10/14/2024   pantoprazole  (PROTONIX ) 40 MG tablet Take 1 tablet (40 mg total) by mouth daily. (Patient taking differently: Take 40 mg by mouth daily before breakfast.) 30 tablet 0 10/14/2024 Morning   POTASSIUM PO Take 1 tablet by mouth at bedtime.   10/14/2024 Bedtime   pregabalin  (LYRICA ) 150 MG capsule Take 450 mg by mouth at bedtime.   10/14/2024 Bedtime   sertraline  (ZOLOFT ) 100 MG tablet Take 100 mg by mouth daily.   10/14/2024 Morning   Scheduled:   aspirin  EC  81 mg Oral Daily   atorvastatin   80 mg Oral q1800   carvedilol   6.25 mg Oral BID WC   Chlorhexidine  Gluconate Cloth  6 each Topical Daily   clonazePAM   1 mg Oral BID   enoxaparin  (LOVENOX ) injection  40 mg Subcutaneous Q24H   ezetimibe   10 mg Oral Daily   hyaluronidase  Human  150 Units Subcutaneous Once   insulin  aspart  0-5 Units Subcutaneous QHS   insulin  aspart  0-9 Units Subcutaneous TID WC   irbesartan   150 mg Oral Daily   pantoprazole   40 mg Oral Daily   pregabalin   150 mg Oral TID   sodium chloride  flush  3 mL Intravenous Q12H   ticagrelor   90 mg Oral BID   Infusions:   sodium chloride      alteplase  (LIMB ISCHEMIA) 10 mg in normal saline (0.02 mg/mL) infusion 1 mg/hr (10/16/24 1729)   heparin  800 Units/hr (10/16/24 1731)    Assessment: Patient here with SFA occlusion s/p U/S guided access, femoral-femoral bypass, L leg angiogram, selective injection, L popliteal artery, and placement of lysis catheter with vascular surgery on 12/9. Patient is also receiving  1 mg/hr of alteplase  via R femoral sheath.   Heparin  level 1.01 is supra-therapeutic (received 10,000 units in procedure). Heparin  currently infusing at 800 units/hr.   Goal of Therapy:  Heparin  level 0.2-0.5 units/ml Monitor platelets by anticoagulation protocol: Yes   Plan:  Enoxaparin  prophylaxis discontinued  Reduce heparin  infusion to 650 units/hr Check anti-Xa level in 6 hours and daily while on heparin  Continue to monitor H&H and platelets  Rankin Sams 10/16/2024,5:49 PM

## 2024-10-16 NOTE — Op Note (Signed)
    Patient name: Alice Bennett MRN: [id number] DOB: 1960-11-14 Sex: female  10/15/2024 - 10/16/2024 Pre-operative Diagnosis: Left blue toe Post-operative diagnosis:  Same Surgeon:  Malvina New Procedure Performed:  1.  U/S guided access, femoral-femoral bypass  2.  Left leg angiogram  3.  Selective injection, left popliteal artery  4.  Placement of lysis catheter  5.  Conscious sedation, 30 minutes     Indications:  This is 63 year old female with history of right iliac stenting which ultimately occluded, necessating a left to right fem-fem.  She now presents with a left blue toe and left SFA occlusion seen on CTA.  Her symptoms are 17 weeks old  Procedure:  The patient was identified in the holding area and taken to room 8.  The patient was then placed supine on the table and prepped and draped in the usual sterile fashion.  A time out was called.  Conscious sedation was administered with the use of IV fentanyl  and Versed  under continuous physician and nurse monitoring.  Heart rate, blood pressure, and oxygen saturation were continuously monitored.  Total sedation time was 30 minutes.  Ultrasound was used to evaluate the fem-fem bypass.  The graft was cannulated with a micropuncture needle.  A 018 wire was inserted followed by placement of a micropuncture sheath.  Next, a Glidewire advantage was placed followed by a 5 French sheath.  A quick cross catheter was advanced into the common femoral artery on the left and selective injections were performed.   Findings:     Left Lower Extremity: The left common femoral and profundofemoral arteries were widely patent.  The superficial femoral artery is patent however it occludes in the mid thigh for approximately 10 cm.  There is reconstitution of the above-knee popliteal artery with two-vessel runoff via the posterior tibial and peroneal artery.  Intervention: Next, a 6 French by 15 cm sheath was inserted.  The patient was fully heparinized.   Using a Glidewire advantage and the quick cross catheter, the wire easily went across the occlusion.  A selective injection was then performed with the catheter in the popliteal artery, confirming crossing of the lesion.  Because of how easy the wire went across the lesion and because of her blue toe, I elected to leave a UniFuse 50 cm catheter for overnight tPA infusion.  The catheter was sutured in place.  Impression:  #1  Left SFA occlusion, successfully crossed.  A 50 cm infusion length UniFuse catheter was placed for overnight administration of tPA     V. Malvina New, M.D., [facility] [place] Office: [phone] Pager:  [phone]

## 2024-10-16 NOTE — H&P (View-Only) (Signed)
**Note Alice-Identified via Obfuscation**  Hospital Consult    Reason for Consult: Left lower extremity blue toe syndrome Requesting Physician: Hospitalist MRN #:  [id number]  History of Present Illness: This is a 63 y.o. female who presented to the hospital with a 2+ history of discoloration to the left great toe.  There is also some discoloration to the left fourth toe.  Patient well-known to the vascular surgery service line having previously undergone multiple interventions including iliac artery stenting, femoral-femoral bypass.  Vascular surgery was called due to discoloration of the toe and nonpalpable pulse in the left foot. On exam, Alice Bennett was resting comfortably.  She noted color changes to the toe over a week ago.  This worsened over Thanksgiving.  Denies sensorimotor deficits in the feet.  Past Medical History:  Diagnosis Date   CAD (coronary artery disease)    07/10/17 PCI/DES to LAD, EF 35-40%   Diabetes mellitus without complication (HCC)    Hyperlipidemia    Hypertension    NSTEMI (non-ST elevated myocardial infarction) [facility])     Past Surgical History:  Procedure Laterality Date   ABDOMINAL AORTOGRAM W/LOWER EXTREMITY N/A 07/26/2022   Procedure: ABDOMINAL AORTOGRAM W/LOWER EXTREMITY;  Surgeon: Court Dorn PARAS, MD;  Location: [facility];  Service: Cardiovascular;  Laterality: N/A;   ABDOMINAL AORTOGRAM W/LOWER EXTREMITY N/A 04/22/2023   Procedure: ABDOMINAL AORTOGRAM W/LOWER EXTREMITY;  Surgeon: Magda Debby SAILOR, MD;  Location: [facility];  Service: Cardiovascular;  Laterality: N/A;   AMPUTATION TOE Right 10/27/2023   Procedure: AMPUTATION TOE;  Surgeon: Malvin Marsa FALCON, DPM;  Location: [facility];  Service: Orthopedics/Podiatry;  Laterality: Right;  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   AORTOGRAM Bilateral 02/05/2023   Procedure: AORTOGRAM WITH RUN OFF;  Surgeon: Serene Gaile ORN, MD;  Location: [facility] OR;  Service: Vascular;  Laterality: Bilateral;   APPLICATION OF WOUND VAC Bilateral 05/04/2023    Procedure: APPLICATION OF WOUND VAC;  Surgeon: Serene Gaile ORN, MD;  Location: [facility] OR;  Service: Vascular;  Laterality: Bilateral;   CORONARY PRESSURE/FFR STUDY N/A 07/10/2017   Procedure: INTRAVASCULAR PRESSURE WIRE/FFR STUDY;  Surgeon: Jordan, Peter M, MD;  Location: [facility];  Service: Cardiovascular;  Laterality: N/A;   CORONARY PRESSURE/FFR STUDY N/A 05/31/2022   Procedure: INTRAVASCULAR PRESSURE WIRE/FFR STUDY;  Surgeon: Mady Bruckner, MD;  Location: [facility];  Service: Cardiovascular;  Laterality: N/A;   CORONARY STENT INTERVENTION N/A 07/10/2017   Procedure: CORONARY STENT INTERVENTION;  Surgeon: Jordan, Peter M, MD;  Location: [facility] INVASIVE CV LAB;  Service: Cardiovascular;  Laterality: N/A;   CORONARY STENT INTERVENTION N/A 05/31/2022   Procedure: CORONARY STENT INTERVENTION;  Surgeon: Mady Bruckner, MD;  [facility]: [facility];  Service: Cardiovascular;  Laterality: N/A;   FEMORAL-FEMORAL BYPASS GRAFT Bilateral 05/04/2023   Procedure: LEFT TO RIGHT FEMORAL-FEMORAL ARTERY BYPASS;  Surgeon: Serene Gaile ORN, MD;  Location: [facility];  Service: Vascular;  Laterality: Bilateral;   INSERTION OF ILIAC STENT Right 02/05/2023   Procedure: INSERTION OF RIGHT ILIAC STENT;  Surgeon: Serene Gaile ORN, MD;  Location: [facility];  Service: Vascular;  Laterality: Right;   LEFT HEART CATH AND CORONARY ANGIOGRAPHY N/A 07/10/2017   Procedure: LEFT HEART CATH AND CORONARY ANGIOGRAPHY;  Surgeon: Jordan, Peter M, MD;  Location: [facility] INVASIVE CV LAB;  Service: Cardiovascular;  Laterality: N/A;   LOWER EXTREMITY ANGIOGRAM N/A 02/05/2023   Procedure: RIGHT LOWER EXTREMITY ANGIOGRAM;  Surgeon: Serene Gaile ORN, MD;  Location: [facility];  Service: Vascular;  Laterality: N/A;  PERIPHERAL VASCULAR INTERVENTION  07/26/2022   Procedure: PERIPHERAL VASCULAR INTERVENTION;  Surgeon: Court Dorn PARAS, MD;  Location: [facility] INVASIVE CV LAB;  Service: Cardiovascular;;   PERIPHERAL VASCULAR INTERVENTION  04/22/2023    Procedure: PERIPHERAL VASCULAR INTERVENTION;  Surgeon: Magda Debby SAILOR, MD;  [facility]: [facility];  Service: Cardiovascular;;  right common and ecternal iliac, right common femoral   RIGHT/LEFT HEART CATH AND CORONARY ANGIOGRAPHY N/A 05/31/2022   Procedure: RIGHT/LEFT HEART CATH AND CORONARY ANGIOGRAPHY;  Surgeon: Mady Bruckner, MD;  [facility];  Service: Cardiovascular;  Laterality: N/A;   ULTRASOUND GUIDANCE FOR VASCULAR ACCESS Bilateral 02/05/2023   Procedure: ULTRASOUND GUIDANCE FOR VASCULAR ACCESS;  Surgeon: Serene Gaile ORN, MD;  Location: [facility];  Service: Vascular;  Laterality: Bilateral;    Allergies  Allergen Reactions   Codeine Itching    Prior to Admission medications   Medication Sig Start Date End Date Taking? Authorizing Provider  aspirin  EC 81 MG tablet Take 81 mg by mouth daily.   Yes [provider]  atorvastatin  (LIPITOR ) 80 MG tablet Take 1 tablet (80 mg total) by mouth daily at 6 PM. 07/13/24  Yes Court Dorn PARAS, MD  BRILINTA  90 MG TABS tablet TAKE 1 TABLET(90 MG) BY MOUTH TWICE DAILY Patient taking differently: Take 90 mg by mouth in the morning and at bedtime. 03/07/24  Yes Serene Gaile ORN, MD  carvedilol  (COREG ) 6.25 MG tablet Take 1 tablet (6.25 mg total) by mouth 2 (two) times daily with a meal. 07/19/17  Yes Burnard Debby LABOR, MD  Cholecalciferol  (D3 HIGH POTENCY) 125 MCG (5000 UT) capsule Take 5,000 Units by mouth daily.   Yes [provider]  clonazePAM  (KLONOPIN ) 1 MG tablet Take 2 mg by mouth at bedtime.   Yes [provider]  ezetimibe  (ZETIA ) 10 MG tablet Take 1 tablet (10 mg total) by mouth daily. 07/13/24  Yes Court Dorn PARAS, MD  FARXIGA  10 MG TABS tablet Take 10 mg by mouth daily.  03/28/19  Yes [provider]  fluticasone (FLONASE) 50 MCG/ACT nasal spray Place 1 spray into both nostrils 2 (two) times daily as needed for allergies or rhinitis.   Yes [provider]  nitroGLYCERIN   (NITROSTAT ) 0.4 MG SL tablet Place 0.4 mg under the tongue every 5 (five) minutes as needed for chest pain.   Yes [provider]  olmesartan  (BENICAR ) 20 MG tablet Take 1 tablet (20 mg total) by mouth daily. 03/09/21  Yes Burnard Debby LABOR, MD  OZEMPIC, 2 MG/DOSE, 8 MG/3ML SOPN Inject 2 mg into the skin every Sunday. 02/01/23  Yes [provider]  pantoprazole  (PROTONIX ) 40 MG tablet Take 1 tablet (40 mg total) by mouth daily. Patient taking differently: Take 40 mg by mouth daily before breakfast. 07/19/17  Yes Burnard Debby LABOR, MD  POTASSIUM PO Take 1 tablet by mouth at bedtime.   Yes [provider]  pregabalin  (LYRICA ) 150 MG capsule Take 450 mg by mouth at bedtime.   Yes [provider]  sertraline  (ZOLOFT ) 100 MG tablet Take 100 mg by mouth daily.   Yes [provider]    Social History   Socioeconomic History   Marital status: Single    Spouse name: Not on file   Number of children: Not on file   Years of education: Not on file   Highest education level: Not on file  Occupational History   Not on file  Tobacco Use   Smoking status: Former  Current packs/day: 0.00    Average packs/day: 0.5 packs/day for 10.0 years (5.0 ttl pk-yrs)    Types: Cigarettes    Quit date: 04/2023    Years since quitting: 1.5    Passive exposure: Current   Smokeless tobacco: Never  Vaping Use   Vaping status: Never Used  Substance and Sexual Activity   Alcohol use: No   Drug use: No   Sexual activity: Not on file  Other Topics Concern   Not on file  Social History Narrative   Not on file   Social Drivers of Health   Financial Resource Strain: Not on file  Food Insecurity: No Food Insecurity (08/09/2024)   Received from [facility]   Hunger Vital Sign    Within the past 12 months, you worried that your food would run out before you got the money to buy more.: Never true    Within the past 12 months, the food you bought just didn't last and you  didn't have money to get more.: Never true  Transportation Needs: No Transportation Needs (08/09/2024)   Received from [facility] - Transportation    In the past 12 months, has lack of transportation kept you from medical appointments or from getting medications?: No    In the past 12 months, has lack of transportation kept you from meetings, work, or from getting things needed for daily living?: No  Physical Activity: Not on file  Stress: No Stress Concern Present (08/09/2024)   Received from [facility] - Occupational Stress Questionnaire    Do you feel stress - tense, restless, nervous, or anxious, or unable to sleep at night because your mind is troubled all the time - these days?: Only a little  Social Connections: Not on file  Intimate Partner Violence: Not At Risk (08/07/2024)   Received from [facility]   HITS    Over the last 12 months how often did your partner physically hurt you?: Never    Over the last 12 months how often did your partner insult you or talk down to you?: Never    Over the last 12 months how often did your partner threaten you with physical harm?: Never    Over the last 12 months how often did your partner scream or curse at you?: Never    No family history on file.  ROS: Otherwise negative unless mentioned in HPI  Physical Examination  Vitals:   10/16/24 0830 10/16/24 0924  BP: 133/63 (!) 142/59  Pulse: 66 70  Resp:  18  Temp:  99.2 F (37.3 C)  SpO2: 98% 97%   Body mass index is 33.5 kg/m.  General:  WDWN in NAD, obese Gait: Not observed HENT: WNL, normocephalic Pulmonary: normal non-labored breathing, without Rales, rhonchi,  wheezing Cardiac: regular Abdomen:  soft, NT/ND, no masses Skin: without rashes Vascular Exam/Pulses: Palpable femoral-femoral bypass, nonpalpable pulses in the feet Extremities: with ischemic changes, without Gangrene , without cellulitis; without open wounds;   Musculoskeletal: no muscle wasting or atrophy  Neurologic: A&O X 3;  No focal weakness or paresthesias are detected; speech is fluent/normal Psychiatric:  The pt has Normal affect. Lymph:  Unremarkable  CBC    Component Value Date/Time   WBC 6.0 10/16/2024 0453   RBC 4.45 10/16/2024 0453   HGB 13.0 10/16/2024 0453   HGB 13.8 07/07/2022 1531   HCT 40.2 10/16/2024 0453   HCT 41.8 07/07/2022 1531  PLT 174 10/16/2024 0453   PLT 192 07/07/2022 1531   MCV 90.3 10/16/2024 0453   MCV 86 07/07/2022 1531   MCH 29.2 10/16/2024 0453   MCHC 32.3 10/16/2024 0453   RDW 15.5 10/16/2024 0453   RDW 14.2 07/07/2022 1531   LYMPHSABS 2.1 10/15/2024 1700   MONOABS 0.5 10/15/2024 1700   EOSABS 0.1 10/15/2024 1700   BASOSABS 0.0 10/15/2024 1700    BMET    Component Value Date/Time   NA 140 10/16/2024 0453   NA 142 07/07/2022 1531   K 3.2 (L) 10/16/2024 0453   CL 104 10/16/2024 0453   CO2 27 10/16/2024 0453   GLUCOSE 152 (H) 10/16/2024 0453   BUN 7 (L) 10/16/2024 0453   BUN 8 07/07/2022 1531   CREATININE 0.86 10/16/2024 0453   CALCIUM  8.3 (L) 10/16/2024 0453   GFRNONAA >60 10/16/2024 0453   GFRAA 53 (L) 06/09/2020 0331    COAGS: Lab Results  Component Value Date   INR 0.93 07/10/2017     ASSESSMENT/PLAN: This is a 63 y.o. female with known peripheral arterial disease and history of femoral-femoral bypass who presents with left 1st and 4th toe discoloration which is been present for a number of weeks.  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  CTA demonstrates occlusion of the superficial femoral artery as well as the anterior tibial artery.  I had a long conversation with Alice Bennett regarding the above.  I think that she would benefit from left lower extremity angiography with possible intervention.  She really has had trouble with stent patency in the past, so I did not see superficial femoral artery stenting having significant durability, but I am hopeful that this will  allow her to have definitive management of the toe which may result in amputation once the toe is further demarcated.  Please keep NPO.  Plan for intervention today.   Alice FORBES Rim MD MS [facility] [phone] 10/16/2024  9:58 AM

## 2024-10-16 NOTE — Progress Notes (Signed)
   Brief Progress Note   _____________________________________________________________________________________________________________  Patient Name: KIONI STAHL Patient DOB: November 16, 1960 Date: @TODAY @      Data: Reviewed labs, vital signs, and notes.    Action: No action required at this time.    Response:    _____________________________________________________________________________________________________________  [facility] RN Expeditor Rush Salce S Xareni Kelch Please contact us  directly via secure chat (search for [facility]) or by calling us  at [phone] [facility]).

## 2024-10-16 NOTE — Consult Note (Signed)
**Note Alice-Identified via Obfuscation**  Hospital Consult    Reason for Consult: Left lower extremity blue toe syndrome Requesting Physician: Hospitalist MRN #:  [id number]  History of Present Illness: This is a 63 y.o. female who presented to the hospital with a 2+ history of discoloration to the left great toe.  There is also some discoloration to the left fourth toe.  Patient well-known to the vascular surgery service line having previously undergone multiple interventions including iliac artery stenting, femoral-femoral bypass.  Vascular surgery was called due to discoloration of the toe and nonpalpable pulse in the left foot. On exam, Alice Bennett was resting comfortably.  She noted color changes to the toe over a week ago.  This worsened over Thanksgiving.  Denies sensorimotor deficits in the feet.  Past Medical History:  Diagnosis Date   CAD (coronary artery disease)    07/10/17 PCI/DES to LAD, EF 35-40%   Diabetes mellitus without complication (HCC)    Hyperlipidemia    Hypertension    NSTEMI (non-ST elevated myocardial infarction) [facility])     Past Surgical History:  Procedure Laterality Date   ABDOMINAL AORTOGRAM W/LOWER EXTREMITY N/A 07/26/2022   Procedure: ABDOMINAL AORTOGRAM W/LOWER EXTREMITY;  Surgeon: Court Dorn PARAS, MD;  [facility]: [facility];  Service: Cardiovascular;  Laterality: N/A;   ABDOMINAL AORTOGRAM W/LOWER EXTREMITY N/A 04/22/2023   Procedure: ABDOMINAL AORTOGRAM W/LOWER EXTREMITY;  Surgeon: Magda Debby SAILOR, MD;  Location: [facility];  Service: Cardiovascular;  Laterality: N/A;   AMPUTATION TOE Right 10/27/2023   Procedure: AMPUTATION TOE;  Surgeon: Malvin Marsa FALCON, DPM;  Location: [facility];  Service: Orthopedics/Podiatry;  Laterality: Right;  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   AORTOGRAM Bilateral 02/05/2023   Procedure: AORTOGRAM WITH RUN OFF;  Surgeon: Serene Gaile ORN, MD;  Location: [facility];  Service: Vascular;  Laterality: Bilateral;   APPLICATION OF WOUND VAC Bilateral 05/04/2023    Procedure: APPLICATION OF WOUND VAC;  Surgeon: Serene Gaile ORN, MD;  Location: [facility];  Service: Vascular;  Laterality: Bilateral;   CORONARY PRESSURE/FFR STUDY N/A 07/10/2017   Procedure: INTRAVASCULAR PRESSURE WIRE/FFR STUDY;  Surgeon: Jordan, Peter M, MD;  [facility];  Service: Cardiovascular;  Laterality: N/A;   CORONARY PRESSURE/FFR STUDY N/A 05/31/2022   Procedure: INTRAVASCULAR PRESSURE WIRE/FFR STUDY;  Surgeon: Mady Bruckner, MD;  [facility];  Service: Cardiovascular;  Laterality: N/A;   CORONARY STENT INTERVENTION N/A 07/10/2017   Procedure: CORONARY STENT INTERVENTION;  Surgeon: Jordan, Peter M, MD;  Location: [facility] INVASIVE CV LAB;  Service: Cardiovascular;  Laterality: N/A;   CORONARY STENT INTERVENTION N/A 05/31/2022   Procedure: CORONARY STENT INTERVENTION;  Surgeon: Mady Bruckner, MD;  [facility];  Service: Cardiovascular;  Laterality: N/A;   FEMORAL-FEMORAL BYPASS GRAFT Bilateral 05/04/2023   Procedure: LEFT TO RIGHT FEMORAL-FEMORAL ARTERY BYPASS;  Surgeon: Serene Gaile ORN, MD;  [facility];  Service: Vascular;  Laterality: Bilateral;   INSERTION OF ILIAC STENT Right 02/05/2023   Procedure: INSERTION OF RIGHT ILIAC STENT;  Surgeon: Serene Gaile ORN, MD;  [facility];  Service: Vascular;  Laterality: Right;   LEFT HEART CATH AND CORONARY ANGIOGRAPHY N/A 07/10/2017   Procedure: LEFT HEART CATH AND CORONARY ANGIOGRAPHY;  Surgeon: Jordan, Peter M, MD;  Location: [facility] INVASIVE CV LAB;  Service: Cardiovascular;  Laterality: N/A;   LOWER EXTREMITY ANGIOGRAM N/A 02/05/2023   Procedure: RIGHT LOWER EXTREMITY ANGIOGRAM;  Surgeon: Serene Gaile ORN, MD;  [facility];  Service: Vascular;  Laterality: N/A;  PERIPHERAL VASCULAR INTERVENTION  07/26/2022   Procedure: PERIPHERAL VASCULAR INTERVENTION;  Surgeon: Court Dorn PARAS, MD;  Location: [facility] INVASIVE CV LAB;  Service: Cardiovascular;;   PERIPHERAL VASCULAR INTERVENTION  04/22/2023    Procedure: PERIPHERAL VASCULAR INTERVENTION;  Surgeon: Magda Debby SAILOR, MD;  [facility];  Service: Cardiovascular;;  right common and ecternal iliac, right common femoral   RIGHT/LEFT HEART CATH AND CORONARY ANGIOGRAPHY N/A 05/31/2022   Procedure: RIGHT/LEFT HEART CATH AND CORONARY ANGIOGRAPHY;  Surgeon: Mady Bruckner, MD;  [facility];  Service: Cardiovascular;  Laterality: N/A;   ULTRASOUND GUIDANCE FOR VASCULAR ACCESS Bilateral 02/05/2023   Procedure: ULTRASOUND GUIDANCE FOR VASCULAR ACCESS;  Surgeon: Serene Gaile ORN, MD;  Location: [facility];  Service: Vascular;  Laterality: Bilateral;    Allergies  Allergen Reactions   Codeine Itching    Prior to Admission medications   Medication Sig Start Date End Date Taking? Authorizing Provider  aspirin  EC 81 MG tablet Take 81 mg by mouth daily.   Yes [provider]  atorvastatin  (LIPITOR ) 80 MG tablet Take 1 tablet (80 mg total) by mouth daily at 6 PM. 07/13/24  Yes Court Dorn PARAS, MD  BRILINTA  90 MG TABS tablet TAKE 1 TABLET(90 MG) BY MOUTH TWICE DAILY Patient taking differently: Take 90 mg by mouth in the morning and at bedtime. 03/07/24  Yes Serene Gaile ORN, MD  carvedilol  (COREG ) 6.25 MG tablet Take 1 tablet (6.25 mg total) by mouth 2 (two) times daily with a meal. 07/19/17  Yes Burnard Debby LABOR, MD  Cholecalciferol  (D3 HIGH POTENCY) 125 MCG (5000 UT) capsule Take 5,000 Units by mouth daily.   Yes [provider]  clonazePAM  (KLONOPIN ) 1 MG tablet Take 2 mg by mouth at bedtime.   Yes [provider]  ezetimibe  (ZETIA ) 10 MG tablet Take 1 tablet (10 mg total) by mouth daily. 07/13/24  Yes Court Dorn PARAS, MD  FARXIGA  10 MG TABS tablet Take 10 mg by mouth daily.  03/28/19  Yes [provider]  fluticasone (FLONASE) 50 MCG/ACT nasal spray Place 1 spray into both nostrils 2 (two) times daily as needed for allergies or rhinitis.   Yes [provider]  nitroGLYCERIN   (NITROSTAT ) 0.4 MG SL tablet Place 0.4 mg under the tongue every 5 (five) minutes as needed for chest pain.   Yes [provider]  olmesartan  (BENICAR ) 20 MG tablet Take 1 tablet (20 mg total) by mouth daily. 03/09/21  Yes Burnard Debby LABOR, MD  OZEMPIC, 2 MG/DOSE, 8 MG/3ML SOPN Inject 2 mg into the skin every Sunday. 02/01/23  Yes [provider]  pantoprazole  (PROTONIX ) 40 MG tablet Take 1 tablet (40 mg total) by mouth daily. Patient taking differently: Take 40 mg by mouth daily before breakfast. 07/19/17  Yes Burnard Debby LABOR, MD  POTASSIUM PO Take 1 tablet by mouth at bedtime.   Yes [provider]  pregabalin  (LYRICA ) 150 MG capsule Take 450 mg by mouth at bedtime.   Yes [provider]  sertraline  (ZOLOFT ) 100 MG tablet Take 100 mg by mouth daily.   Yes [provider]    Social History   Socioeconomic History   Marital status: Single    Spouse name: Not on file   Number of children: Not on file   Years of education: Not on file   Highest education level: Not on file  Occupational History   Not on file  Tobacco Use   Smoking status: Former  Current packs/day: 0.00    Average packs/day: 0.5 packs/day for 10.0 years (5.0 ttl pk-yrs)    Types: Cigarettes    Quit date: 04/2023    Years since quitting: 1.5    Passive exposure: Current   Smokeless tobacco: Never  Vaping Use   Vaping status: Never Used  Substance and Sexual Activity   Alcohol use: No   Drug use: No   Sexual activity: Not on file  Other Topics Concern   Not on file  Social History Narrative   Not on file   Social Drivers of Health   Financial Resource Strain: Not on file  Food Insecurity: No Food Insecurity (08/09/2024)   Received from [facility]   Hunger Vital Sign    Within the past 12 months, you worried that your food would run out before you got the money to buy more.: Never true    Within the past 12 months, the food you bought just didn't last and you  didn't have money to get more.: Never true  Transportation Needs: No Transportation Needs (08/09/2024)   Received from [facility] - Transportation    In the past 12 months, has lack of transportation kept you from medical appointments or from getting medications?: No    In the past 12 months, has lack of transportation kept you from meetings, work, or from getting things needed for daily living?: No  Physical Activity: Not on file  Stress: No Stress Concern Present (08/09/2024)   Received from [facility] - Occupational Stress Questionnaire    Do you feel stress - tense, restless, nervous, or anxious, or unable to sleep at night because your mind is troubled all the time - these days?: Only a little  Social Connections: Not on file  Intimate Partner Violence: Not At Risk (08/07/2024)   Received from [facility]   HITS    Over the last 12 months how often did your partner physically hurt you?: Never    Over the last 12 months how often did your partner insult you or talk down to you?: Never    Over the last 12 months how often did your partner threaten you with physical harm?: Never    Over the last 12 months how often did your partner scream or curse at you?: Never    No family history on file.  ROS: Otherwise negative unless mentioned in HPI  Physical Examination  Vitals:   10/16/24 0830 10/16/24 0924  BP: 133/63 (!) 142/59  Pulse: 66 70  Resp:  18  Temp:  99.2 F (37.3 C)  SpO2: 98% 97%   Body mass index is 33.5 kg/m.  General:  WDWN in NAD, obese Gait: Not observed HENT: WNL, normocephalic Pulmonary: normal non-labored breathing, without Rales, rhonchi,  wheezing Cardiac: regular Abdomen:  soft, NT/ND, no masses Skin: without rashes Vascular Exam/Pulses: Palpable femoral-femoral bypass, nonpalpable pulses in the feet Extremities: with ischemic changes, without Gangrene , without cellulitis; without open wounds;   Musculoskeletal: no muscle wasting or atrophy  Neurologic: A&O X 3;  No focal weakness or paresthesias are detected; speech is fluent/normal Psychiatric:  The pt has Normal affect. Lymph:  Unremarkable  CBC    Component Value Date/Time   WBC 6.0 10/16/2024 0453   RBC 4.45 10/16/2024 0453   HGB 13.0 10/16/2024 0453   HGB 13.8 07/07/2022 1531   HCT 40.2 10/16/2024 0453   HCT 41.8 07/07/2022 1531  PLT 174 10/16/2024 0453   PLT 192 07/07/2022 1531   MCV 90.3 10/16/2024 0453   MCV 86 07/07/2022 1531   MCH 29.2 10/16/2024 0453   MCHC 32.3 10/16/2024 0453   RDW 15.5 10/16/2024 0453   RDW 14.2 07/07/2022 1531   LYMPHSABS 2.1 10/15/2024 1700   MONOABS 0.5 10/15/2024 1700   EOSABS 0.1 10/15/2024 1700   BASOSABS 0.0 10/15/2024 1700    BMET    Component Value Date/Time   NA 140 10/16/2024 0453   NA 142 07/07/2022 1531   K 3.2 (L) 10/16/2024 0453   CL 104 10/16/2024 0453   CO2 27 10/16/2024 0453   GLUCOSE 152 (H) 10/16/2024 0453   BUN 7 (L) 10/16/2024 0453   BUN 8 07/07/2022 1531   CREATININE 0.86 10/16/2024 0453   CALCIUM  8.3 (L) 10/16/2024 0453   GFRNONAA >60 10/16/2024 0453   GFRAA 53 (L) 06/09/2020 0331    COAGS: Lab Results  Component Value Date   INR 0.93 07/10/2017     ASSESSMENT/PLAN: This is a 63 y.o. female with known peripheral arterial disease and history of femoral-femoral bypass who presents with left 1st and 4th toe discoloration which is been present for a number of weeks.  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  CTA demonstrates occlusion of the superficial femoral artery as well as the anterior tibial artery.  I had a long conversation with Alice Bennett regarding the above.  I think that she would benefit from left lower extremity angiography with possible intervention.  She really has had trouble with stent patency in the past, so I did not see superficial femoral artery stenting having significant durability, but I am hopeful that this will  allow her to have definitive management of the toe which may result in amputation once the toe is further demarcated.  Please keep NPO.  Plan for intervention today.   Fonda FORBES Rim MD MS [facility] [phone] 10/16/2024  9:58 AM

## 2024-10-16 NOTE — Progress Notes (Signed)
 [facility] Progress Note  Patient: Alice Bennett     FMW:[id number]  DOA: 10/15/2024   PCP: Silvano Angeline JULIANNA, NP       Brief hospital course: [name] with coronary artery disease, PAD status post right Fem-Fem bypass, right fourth toe amputation, diabetes mellitus type 2, hypertension, tobacco use who presents to the hospital for discoloration and pain in the left great toe.  She noted symptoms started around Thanksgiving.  Toe is dusky blue and painful.  Subjective:  Currently no complaints.  Pain relatively controlled  Assessment and Plan: Principal Problem:   Critical limb ischemia of left lower extremity (HCC) - Ischemia noted at the left first toe - Vascular surgery consulted and plan for an angiography today - Continue aspirin , Lipitor  and Zetia   Active Problems:    CAD status post DES to the LAD in 2018  Diabetes mellitus type 2 - Sliding scale insulin  with NovoLog  for now - Farxiga  on hold - Last A1c 7.0  Tobacco abuse - Smokes less than half pack per day - Declining nicotine patch  Hypertension - Continue carvedilol  and olmesartan   Peripheral neuropathy - Continue Lyrica   Anxiety - Continue Klonopin       Code Status: Full Code Total time on patient care: 35 minutes DVT prophylaxis:  enoxaparin  (LOVENOX ) injection 40 mg Start: 10/16/24 1000     Objective:   Vitals:   10/16/24 0744 10/16/24 0749 10/16/24 0830 10/16/24 0924  BP:  (!) 150/75 133/63 (!) 142/59  Pulse:  71 66 70  Resp:  16  18  Temp: 98 F (36.7 C)   99.2 F (37.3 C)  TempSrc: Oral   Oral  SpO2:  98% 98% 97%  Weight:    102.9 kg  Height:    5' 9 (1.753 m)   Filed Weights   10/16/24 0924  Weight: 102.9 kg   Exam: General exam: Appears comfortable  HEENT: oral mucosa moist Respiratory system: Clear to auscultation.  Cardiovascular system: S1 & S2 heard  Gastrointestinal system: Abdomen soft, non-tender, nondistended. Normal bowel sounds    Extremities: No cyanosis, clubbing or edema Psychiatry:  Mood & affect appropriate.      CBC: Recent Labs  Lab 10/15/24 1700 10/16/24 0453  WBC 8.0 6.0  NEUTROABS 5.2  --   HGB 14.3 13.0  HCT 44.3 40.2  MCV 89.5 90.3  PLT 197 174   Basic Metabolic Panel: Recent Labs  Lab 10/15/24 1700 10/16/24 0453  NA 140 140  K 4.0 3.2*  CL 104 104  CO2 27 27  GLUCOSE 142* 152*  BUN 8 7*  CREATININE 0.91 0.86  CALCIUM  9.2 8.3*     Scheduled Meds:  aspirin  EC  81 mg Oral Daily   atorvastatin   80 mg Oral q1800   carvedilol   6.25 mg Oral BID WC   clonazePAM   1 mg Oral BID   enoxaparin  (LOVENOX ) injection  40 mg Subcutaneous Q24H   ezetimibe   10 mg Oral Daily   insulin  aspart  0-5 Units Subcutaneous QHS   insulin  aspart  0-9 Units Subcutaneous TID WC   irbesartan   150 mg Oral Daily   pantoprazole   40 mg Oral Daily   pregabalin   150 mg Oral TID   ticagrelor   90 mg Oral BID    Imaging and lab data personally reviewed   Author: [employer]  10/16/2024 9:43 AM  To contact [facility]>   Check the care team in [facility] and look for the  attending/consulting [facility] provider listed  Log into [email or web address] and use [place]'s universal password   Go to> [facility]  and find provider  If you still have difficulty reaching the provider, please page the [facility] (Director on Call) for the Hospitalists listed on amion

## 2024-10-17 ENCOUNTER — Encounter (HOSPITAL_COMMUNITY): Payer: Self-pay | Admitting: Surgery

## 2024-10-17 ENCOUNTER — Encounter (HOSPITAL_COMMUNITY): Admission: EM | Disposition: A | Payer: Self-pay | Source: Home / Self Care | Attending: Internal Medicine

## 2024-10-17 HISTORY — PX: PERIPHERAL VASCULAR THROMBECTOMY: CATH118306

## 2024-10-17 HISTORY — PX: LOWER EXTREMITY INTERVENTION: CATH118252

## 2024-10-17 LAB — CBC
HCT: 44.1 % (ref 36.0–46.0)
HCT: 42.4 % (ref 36.0–46.0)
Hemoglobin: 14.8 g/dL (ref 12.0–15.0)
Hemoglobin: 14 g/dL (ref 12.0–15.0)
MCH: 29.5 pg (ref 26.0–34.0)
MCH: 28.9 pg (ref 26.0–34.0)
MCHC: 33.6 g/dL (ref 30.0–36.0)
MCHC: 33 g/dL (ref 30.0–36.0)
MCV: 87.8 fL (ref 80.0–100.0)
MCV: 87.4 fL (ref 80.0–100.0)
Platelets: 169 K/uL (ref 150–400)
Platelets: 196 K/uL (ref 150–400)
RBC: 5.02 MIL/uL (ref 3.87–5.11)
RBC: 4.85 MIL/uL (ref 3.87–5.11)
RDW: 15.6 % — ABNORMAL HIGH (ref 11.5–15.5)
RDW: 15.5 % (ref 11.5–15.5)
WBC: 8.4 K/uL (ref 4.0–10.5)
WBC: 9.1 K/uL (ref 4.0–10.5)
nRBC: 0 % (ref 0.0–0.2)
nRBC: 0 % (ref 0.0–0.2)

## 2024-10-17 LAB — HEPARIN LEVEL (UNFRACTIONATED)
Heparin Unfractionated: <0.1 [IU]/mL — ABNORMAL LOW (ref 0.30–0.70)
Heparin Unfractionated: <0.1 [IU]/mL — ABNORMAL LOW (ref 0.30–0.70)

## 2024-10-17 LAB — BASIC METABOLIC PANEL WITH GFR
Anion gap: 8 (ref 5–15)
BUN: 7 mg/dL — ABNORMAL LOW (ref 8–23)
CO2: 25 mmol/L (ref 22–32)
Calcium: 8.7 mg/dL — ABNORMAL LOW (ref 8.9–10.3)
Chloride: 103 mmol/L (ref 98–111)
Creatinine, Ser: 0.94 mg/dL (ref 0.44–1.00)
GFR, Estimated: >60 mL/min (ref >60)
Glucose, Bld: 161 mg/dL — ABNORMAL HIGH (ref 70–99)
Potassium: 4.1 mmol/L (ref 3.5–5.1)
Sodium: 136 mmol/L (ref 135–145)

## 2024-10-17 LAB — GLUCOSE, CAPILLARY
Glucose-Capillary: 157 mg/dL — ABNORMAL HIGH (ref 70–99)
Glucose-Capillary: 131 mg/dL — ABNORMAL HIGH (ref 70–99)
Glucose-Capillary: 115 mg/dL — ABNORMAL HIGH (ref 70–99)
Glucose-Capillary: 118 mg/dL — ABNORMAL HIGH (ref 70–99)
Glucose-Capillary: 142 mg/dL — ABNORMAL HIGH (ref 70–99)

## 2024-10-17 LAB — FIBRINOGEN
Fibrinogen: 393 mg/dL (ref 210–475)
Fibrinogen: >800 mg/dL — ABNORMAL HIGH (ref 210–475)

## 2024-10-17 LAB — MAGNESIUM: Magnesium: 1.6 mg/dL — ABNORMAL LOW (ref 1.7–2.4)

## 2024-10-17 MED ORDER — ACETAMINOPHEN 325 MG PO TABS: 650.0000 mg | ORAL_TABLET | ORAL | Status: DC | PRN

## 2024-10-17 MED ORDER — MAGNESIUM SULFATE 4 GM/100ML IV SOLN
4.0000 g | Freq: Once | INTRAVENOUS | Status: AC
  Administered 2024-10-17: 4 g via INTRAVENOUS
  Filled 2024-10-17: qty 100

## 2024-10-17 MED ORDER — SERTRALINE HCL 100 MG PO TABS
100.0000 mg | ORAL_TABLET | Freq: Every day | ORAL | Status: DC
  Administered 2024-10-17 – 2024-10-19 (×3): 100 mg via ORAL
  Filled 2024-10-17 (×3): qty 1

## 2024-10-17 MED ORDER — SODIUM CHLORIDE 0.9% FLUSH
3.0000 mL | Freq: Two times a day (BID) | INTRAVENOUS | Status: DC
  Administered 2024-10-18 – 2024-10-19 (×3): 3 mL via INTRAVENOUS

## 2024-10-17 MED ORDER — HEPARIN SODIUM (PORCINE) 1000 UNIT/ML IJ SOLN
INTRAMUSCULAR | Status: DC | PRN
  Administered 2024-10-17: 8000 [IU] via INTRAVENOUS

## 2024-10-17 MED ORDER — LABETALOL HCL 5 MG/ML IV SOLN
INTRAVENOUS | Status: AC
  Filled 2024-10-17: qty 4

## 2024-10-17 MED ORDER — HEPARIN SODIUM (PORCINE) 1000 UNIT/ML IJ SOLN
INTRAMUSCULAR | Status: AC
  Filled 2024-10-17: qty 10

## 2024-10-17 MED ORDER — HYDRALAZINE HCL 20 MG/ML IJ SOLN: 5.0000 mg | INTRAMUSCULAR | Status: DC | PRN

## 2024-10-17 MED ORDER — ASPIRIN 81 MG PO TBEC: 81.0000 mg | DELAYED_RELEASE_TABLET | Freq: Every day | ORAL | Status: DC

## 2024-10-17 MED ORDER — HEPARIN (PORCINE) 25000 UT/250ML-% IV SOLN: 1100.0000 [IU]/h | INTRAVENOUS | Status: DC

## 2024-10-17 MED ORDER — LABETALOL HCL 5 MG/ML IV SOLN
INTRAVENOUS | Status: DC | PRN
  Administered 2024-10-17: 10 mg via INTRAVENOUS

## 2024-10-17 MED ORDER — CLONAZEPAM 0.5 MG PO TABS
2.0000 mg | ORAL_TABLET | Freq: Every day | ORAL | Status: DC
  Administered 2024-10-17 – 2024-10-18 (×2): 2 mg via ORAL
  Filled 2024-10-17: qty 2
  Filled 2024-10-17: qty 4

## 2024-10-17 MED ORDER — FENTANYL CITRATE (PF) 100 MCG/2ML IJ SOLN
INTRAMUSCULAR | Status: AC
  Filled 2024-10-17: qty 2

## 2024-10-17 MED ORDER — FENTANYL CITRATE (PF) 100 MCG/2ML IJ SOLN
INTRAMUSCULAR | Status: DC | PRN
  Administered 2024-10-17: 25 ug via INTRAVENOUS

## 2024-10-17 MED ORDER — LACTATED RINGERS IV BOLUS
1000.0000 mL | Freq: Once | INTRAVENOUS | Status: AC
  Administered 2024-10-17: 1000 mL via INTRAVENOUS

## 2024-10-17 MED ORDER — IODIXANOL 320 MG/ML IV SOLN
INTRAVENOUS | Status: DC | PRN
  Administered 2024-10-17: 150 mL via INTRA_ARTERIAL

## 2024-10-17 MED ORDER — MIDAZOLAM HCL (PF) 2 MG/2ML IJ SOLN
INTRAMUSCULAR | Status: DC | PRN
  Administered 2024-10-17: 1 mg via INTRAVENOUS

## 2024-10-17 MED ORDER — LIDOCAINE HCL (PF) 1 % IJ SOLN
INTRAMUSCULAR | Status: DC | PRN
  Administered 2024-10-17: 5 mL

## 2024-10-17 MED ORDER — SODIUM CHLORIDE 0.9 % IV SOLN: 250.0000 mL | INTRAVENOUS | Status: AC | PRN

## 2024-10-17 MED ORDER — SODIUM CHLORIDE 0.9 % WEIGHT BASED INFUSION
1.0000 mL/kg/h | INTRAVENOUS | Status: AC
  Administered 2024-10-17: 1 mL/kg/h via INTRAVENOUS

## 2024-10-17 MED ORDER — MIDAZOLAM HCL 2 MG/2ML IJ SOLN
INTRAMUSCULAR | Status: AC
  Filled 2024-10-17: qty 2

## 2024-10-17 MED ORDER — SODIUM CHLORIDE 0.9% FLUSH: 3.0000 mL | INTRAVENOUS | Status: DC | PRN

## 2024-10-17 MED ORDER — HEPARIN (PORCINE) IN NACL 1000-0.9 UT/500ML-% IV SOLN
INTRAVENOUS | Status: DC | PRN
  Administered 2024-10-17: 1000 mL

## 2024-10-17 MED ORDER — POLYETHYLENE GLYCOL 3350 17 G PO PACK
17.0000 g | PACK | Freq: Two times a day (BID) | ORAL | Status: DC
  Administered 2024-10-17: 17 g via ORAL
  Filled 2024-10-17 (×4): qty 1

## 2024-10-17 MED ORDER — LABETALOL HCL 5 MG/ML IV SOLN: 10.0000 mg | INTRAVENOUS | Status: DC | PRN

## 2024-10-17 MED ORDER — SENNOSIDES-DOCUSATE SODIUM 8.6-50 MG PO TABS
2.0000 | ORAL_TABLET | Freq: Every day | ORAL | Status: DC
  Filled 2024-10-17: qty 2

## 2024-10-17 NOTE — Progress Notes (Signed)
 PHARMACY - ANTICOAGULATION CONSULT NOTE  Pharmacy Consult for heparin   Indication: VTE Treatment  Allergies  Allergen Reactions   Codeine Itching    Patient Measurements: Height: 5' 9 (175.3 cm) Weight: 102.9 kg (226 lb 13.7 oz) IBW/kg (Calculated) : 66.2 HEPARIN  DW (KG): 88.8  Vital Signs: Temp: 97.9 F (36.6 C) (12/10 1744) Temp Source: Oral (12/10 1744) BP: 140/46 (12/10 1744) Pulse Rate: 58 (12/10 1744)  Labs: Recent Labs    10/15/24 1700 10/16/24 0453 10/16/24 1821 10/16/24 2302 10/17/24 0657 10/17/24 1106  HGB 14.3 13.0   < > 13.4 14.8 14.0  HCT 44.3 40.2   < > 40.2 44.1 42.4  PLT 197 174   < > 161 169 196  HEPARINUNFRC  --   --    < > <0.10* <0.10* <0.10*  CREATININE 0.91 0.86  --   --  0.94  --    < > = values in this interval not displayed.    Estimated Creatinine Clearance: 78.2 mL/min (by C-G formula based on SCr of 0.94 mg/dL).   Medical History: Past Medical History:  Diagnosis Date   CAD (coronary artery disease)    07/10/17 PCI/DES to LAD, EF 35-40%   Diabetes mellitus without complication (HCC)    Hyperlipidemia    Hypertension    NSTEMI (non-ST elevated myocardial infarction) [facility])     Assessment: Patient here with SFA occlusion s/p U/S guided access, femoral-femoral bypass, L leg angiogram, selective injection, L popliteal artery, and placement of lysis catheter with vascular surgery on 12/9. Patient is also receiving 1 mg/hr of alteplase  via R femoral sheath (received 10,000 units of heparin  during procedure).   Left lower extremity angiography with thrombolysis catheter removed and alteplase  stopped.  Restarting heparin  8 hours after sheath removal (restart 12/11 0100) at 1100 units/hr. Increased Heparin  level goal increase to 0.3-0.7 after thrombolytics stopped.  Goal of Therapy:  Heparin  level 0.3-0.7 units/ml Monitor platelets by anticoagulation protocol: Yes   Plan:  Restart heparin  infusion at 1100 units/hr Check anti-Xa level  in 6 hours and daily while on heparin  Continue to monitor H&H and platelets   Larraine Brazier, PharmD Clinical Pharmacist 10/17/2024  5:59 PM **Pharmacist phone directory can now be found on [email or web address] ([email or web address]).  Listed under [facility].

## 2024-10-17 NOTE — Progress Notes (Addendum)
 PHARMACY - ANTICOAGULATION CONSULT NOTE  Pharmacy Consult for heparin   Indication: VTE Treatment  Allergies  Allergen Reactions   Codeine Itching    Patient Measurements: Height: 5' 9 (175.3 cm) Weight: 102.9 kg (226 lb 13.7 oz) IBW/kg (Calculated) : 66.2 HEPARIN  DW (KG): 88.8  Vital Signs: Temp: 98.1 F (36.7 C) (12/10 0344) Temp Source: Oral (12/10 0344) BP: 181/71 (12/10 0407) Pulse Rate: 66 (12/10 0100)  Labs: Recent Labs    10/15/24 1700 10/16/24 0453 10/16/24 1821 10/16/24 2302 10/17/24 0657  HGB 14.3 13.0 14.4 13.4 14.8  HCT 44.3 40.2 43.7 40.2 44.1  PLT 197 174 166 161 169  HEPARINUNFRC  --   --  1.05* <0.10* <0.10*  CREATININE 0.91 0.86  --   --   --     Estimated Creatinine Clearance: 85.5 mL/min (by C-G formula based on SCr of 0.86 mg/dL).   Medical History: Past Medical History:  Diagnosis Date   CAD (coronary artery disease)    07/10/17 PCI/DES to LAD, EF 35-40%   Diabetes mellitus without complication (HCC)    Hyperlipidemia    Hypertension    NSTEMI (non-ST elevated myocardial infarction) [facility])     Assessment: Patient here with SFA occlusion s/p U/S guided access, femoral-femoral bypass, L leg angiogram, selective injection, L popliteal artery, and placement of lysis catheter with vascular surgery on 12/9. Patient is also receiving 1 mg/hr of alteplase  via R femoral sheath (received 10,000 units of heparin  during procedure).   AM: heparin  level remains undetectable after rate increase to 800 units/hr.   Alteplase  running at 1mg /hr. CBC stable (Hgb 14.8, pltc 169), fibrinogen  393.   Ongoing oozing from cath site since yesterday - stable per RN, [facility] aware.  For re-look today.  Goal of Therapy:  Heparin  level 0.2-0.5 units/ml Monitor platelets by anticoagulation protocol: Yes   Plan:  Increase heparin  infusion to 950 units/hr Check anti-Xa level in 6 hours and daily while on heparin  Continue to monitor H&H and platelets F/u after relook  today  Update: has not gone for re-look yet.  HL still undetectable, will increase to 1100 units/hr and f/u after re-look.  Maurilio Fila, PharmD Clinical Pharmacist 10/17/2024  7:48 AM

## 2024-10-17 NOTE — Progress Notes (Addendum)
°  Progress Note    10/17/2024 8:36 AM 1 Day Post-Op  Subjective:  No events overnight.  L foot feeling somewhat better   Vitals:   10/17/24 0810 10/17/24 0816  BP: (!) 171/60   Pulse: 72 71  Resp: 17 15  Temp:    SpO2: 91% 95%   Physical Exam: Lungs:  non labored Incisions:  R groin without hematoma Extremities:  venous-like L PT by doppler; calf soft Neurologic: A&O  CBC    Component Value Date/Time   WBC 8.4 10/17/2024 0657   RBC 5.02 10/17/2024 0657   HGB 14.8 10/17/2024 0657   HGB 13.8 07/07/2022 1531   HCT 44.1 10/17/2024 0657   HCT 41.8 07/07/2022 1531   PLT 169 10/17/2024 0657   PLT 192 07/07/2022 1531   MCV 87.8 10/17/2024 0657   MCV 86 07/07/2022 1531   MCH 29.5 10/17/2024 0657   MCHC 33.6 10/17/2024 0657   RDW 15.6 (H) 10/17/2024 0657   RDW 14.2 07/07/2022 1531   LYMPHSABS 2.1 10/15/2024 1700   MONOABS 0.5 10/15/2024 1700   EOSABS 0.1 10/15/2024 1700   BASOSABS 0.0 10/15/2024 1700    BMET    Component Value Date/Time   NA 136 10/17/2024 0657   NA 142 07/07/2022 1531   K 4.1 10/17/2024 0657   CL 103 10/17/2024 0657   CO2 25 10/17/2024 0657   GLUCOSE 161 (H) 10/17/2024 0657   BUN 7 (L) 10/17/2024 0657   BUN 8 07/07/2022 1531   CREATININE 0.94 10/17/2024 0657   CALCIUM  8.7 (L) 10/17/2024 0657   GFRNONAA >60 10/17/2024 0657   GFRAA 53 (L) 06/09/2020 0331    INR    Component Value Date/Time   INR 0.93 07/10/2017 1106     Intake/Output Summary (Last 24 hours) at 10/17/2024 0836 Last data filed at 10/17/2024 0650 Gross per 24 hour  Intake 602.81 ml  Output 550 ml  Net 52.81 ml     Assessment/Plan:  63 y.o. female is s/p angiography with thrombolysis of LLE 1 Day Post-Op   L foot subjectively feeling a little better this morning.  All toes of L foot are dusky appearing however motor and sensation remains intact.  She has a venous-like doppler signal in the PT position.  R groin is without firm hematoma.  Plan for lysis recheck this  afternoon with Dr. Lanis.  Continue npo.   Donnice Sender, PA-C [facility] [phone] 10/17/2024 8:36 AM   I agree with the above.  Have seen and evaluated patient.  Plan for  follow-up lysis study today  Wells Lasharn Bufkin

## 2024-10-17 NOTE — Progress Notes (Deleted)
 Cardiology Office Note    Date:  10/17/2024  ID:  Alice Bennett, DOB 1961/07/27, MRN [id number] PCP:  Alice Angeline JULIANNA, NP  Cardiologist:  Dorn Lesches, MD  Electrophysiologist:  None   Chief Complaint: ***  History of Present Illness: .    Alice Bennett is a 63 y.o. female with visit-pertinent history of ***  Labwork independently reviewed:   ROS: .   *** denies chest pain, shortness of breath, lower extremity edema, fatigue, palpitations, melena, hematuria, hemoptysis, diaphoresis, weakness, presyncope, syncope, orthopnea, and PND.  All other systems are reviewed and otherwise negative.  Studies Reviewed: [name]    EKG:  EKG is ordered today, personally reviewed, demonstrating ***     CV Studies: Cardiac studies reviewed are outlined and summarized above. Otherwise please see EMR for full report. Cardiac Studies & Procedures   ______________________________________________________________________________________________ CARDIAC CATHETERIZATION  CARDIAC CATHETERIZATION 05/31/2022  Conclusion Conclusions: Moderate two-vessel coronary artery disease with 50-60% mid LAD stenosis between two previously placed stents that is hemodynamically significant (RFR 0.89) and 50-60% proximal RCA stenosis that is borderline hemodynamically significant (RFR 0.90). Widely patent proximal and mid LAD stents. Low normal left ventricular systolic function (LVEF 50-55%). Moderately elevated left heart filling pressures (PCWP/LVEDP 25-30 mmHg). Mildly elevated right heart and pulmonary artery pressures (mean RA 9  mmHg, mean PA 25 mmHg). Low normal to mildly reduced Fick cardiac output (CO 5.3 L/min, CI 2.4 L/min/m). Successful RFR-guided PCI to mid LAD using Synergy 3.0 x 12 mm drug-eluting stent with 0% residual stenosis and TIMI-3 flow.  Recommendations: Continue indefinite dual antiplatelet therapy with aspirin  and clopidogrel . Favor medical therapy of RCA disease.  Could consider PCI to  proximal RCA if symptoms do not resolve despite aggressive antianginal and heart failure therapy. Gentle diuresis. Aggressive secondary prevention of coronary artery disease.  Alice Hanson, MD [facility]  Findings Coronary Findings Diagnostic  Dominance: Right  Left Main Vessel is large. Vessel is angiographically normal.  Left Anterior Descending Vessel is large. Prox LAD to Mid LAD lesion is 30% stenosed. The lesion was previously treated . Mid LAD-1 lesion is 55% stenosed. Pressure wire/FFR was performed on the lesion. RFR = 0.89. Previously placed Mid LAD-2 stent of unknown type is  widely patent.  First Diagonal Branch Vessel is small in size.  Ramus Intermedius Vessel is large.  Lateral Ramus Intermedius Vessel is moderate in size.  Left Circumflex Prox Cx to Mid Cx lesion is 30% stenosed.  First Obtuse Marginal Branch Vessel is small in size.  Second Obtuse Marginal Branch Vessel is moderate in size.  Right Coronary Artery Vessel is moderate in size. Prox RCA lesion is 55% stenosed. Pressure wire/FFR was performed on the lesion. RFR = 0.90.  Right Posterior Descending Artery Vessel is moderate in size.  Right Posterior Atrioventricular Artery Vessel is moderate in size.  First Right Posterolateral Branch Vessel is small in size.  Second Right Posterolateral Branch Vessel is moderate in size.  Third Right Posterolateral Branch Vessel is moderate in size.  Intervention  Mid LAD-1 lesion Stent (Also treats lesions: Mid LAD-2) Pre-stent angioplasty was performed using a BALLN SAPPHIRE 2.5X12. Maximum pressure:  6 atm. A drug-eluting stent was successfully placed using a SYNERGY XD 3.0X12. Maximum pressure: 16 atm. Post-stent angioplasty was performed using a BALL SAPPHIRE NC24 3.25X8. Maximum pressure:  16 atm. Post-Intervention Lesion Assessment The intervention was successful. Pre-interventional TIMI flow is 3. Post-intervention TIMI flow is  3. No complications occurred at this lesion. There is  a 0% residual stenosis post intervention.  Mid LAD-2 lesion Stent (Also treats lesions: Mid LAD-1) See details in Mid LAD-1 lesion. Post-Intervention Lesion Assessment The intervention was successful. Pre-interventional TIMI flow is 3. Post-intervention TIMI flow is 3. No complications occurred at this lesion. There is a 0% residual stenosis post intervention.   CARDIAC CATHETERIZATION  CARDIAC CATHETERIZATION 07/10/2017  Conclusion  Prox RCA lesion, 35 %stenosed.  Prox LAD lesion, 0 %stenosed.  Prox Cx to Mid Cx lesion, 50 %stenosed.  A STENT SIERRA 3.00 X 15 MM drug eluting stent was successfully placed, and does not overlap previously placed stent.  Mid LAD lesion, 70 %stenosed.  Post intervention, there is a 0% residual stenosis.  There is moderate left ventricular systolic dysfunction.  The left ventricular ejection fraction is 35-45% by visual estimate.  LV end diastolic pressure is severely elevated.  1. Single vessel obstructive CAD with 70% mid LAD. FFR 0.79. Stent in proximal LAD is patent 2. Severe LV dysfunction. Wall motion abnormality is more consistent with Takotsubo pattern 3. Markedly elevated LVEDP 4. Successful stenting of the mid LAD with DES.  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 The mid LAD stenosis was not critical and is unlikely to have caused these findings. It was borderline obstructive with abnormal FFR so we did proceed with stenting of this lesion. Will continue DAPT for one year. IV diuresis. Optimize BP control with beta blocker and ACEi.  Findings Coronary Findings Diagnostic  Dominance: Right  Left Main Vessel was injected. Vessel is normal in caliber. Vessel is angiographically normal.  Left Anterior Descending Previously placed Prox LAD bare metal stent is widely patent. The lesion is focal. Pressure wire/FFR  was performed on the lesion. FFR: 0.79.  Left Circumflex  Right Coronary Artery  Intervention  Mid LAD lesion Angioplasty Lesion crossed with guidewire using a GUIDEWIRE COMET PRESSURE. Pre-stent angioplasty was performed using a BALLOON SAPPHIRE 2.5X12. Maximum pressure: 6 atm. A STENT SIERRA 3.00 X 15 MM drug eluting stent was successfully placed, and does not overlap previously placed stent. Stent strut is well apposed. Post-stent angioplasty was performed using a BALLOON [place] EUPHORA [id number]. Maximum pressure: 16 atm. The pre-interventional distal flow is normal (TIMI 3).  The post-interventional distal flow is normal (TIMI 3). The intervention was successful . No complications occurred at this lesion. There is a 0% residual stenosis post intervention.   STRESS TESTS  MYOCARDIAL PERFUSION IMAGING 01/20/2021  Interpretation Summary  The left ventricular ejection fraction is normal (55-65%).  Nuclear stress EF: 62%.  There was no ST segment deviation noted during stress.  Findings consistent with prior myocardial infarction.  This is a low risk study.  There is a small, fixed, mild perfusion defect present in the basal inferoseptal and mid inferoseptal location. Findings consistent with prior infarct. No ischemia on perfusion images.   ECHOCARDIOGRAM  ECHOCARDIOGRAM COMPLETE 05/31/2022  Narrative ECHOCARDIOGRAM REPORT    Patient Name:   JOBY HERSHKOWITZ Saha Date of Exam: 05/31/2022 Medical Rec #:  [id number]       Height:       69.0 in Accession #:    [id number]      Weight:       226.4 lb Date of Birth:  [birth year]       BSA:          2.178 m Patient Age:    60 years        BP:  121/59 mmHg Patient Gender: F               HR:           59 bpm. Exam Location:  Inpatient  Procedure: 2D Echo, Cardiac Doppler and Color Doppler  Indications:    Chest Pain  History:        Patient has prior history of Echocardiogram examinations, most recent 05/30/2020. CAD; Risk  Factors:Diabetes and Hypertension.  Sonographer:    Elida Casey Referring Phys: [phone] HAO MENG  IMPRESSIONS   1. Mid/distal septal hypokinesis . Left ventricular ejection fraction, by estimation, is 50 to 55%. The left ventricle has low normal function. The left ventricle demonstrates regional wall motion abnormalities (see scoring diagram/findings for description). There is mild left ventricular hypertrophy. Left ventricular diastolic parameters were normal. 2. Right ventricular systolic function is normal. The right ventricular size is normal. There is mildly elevated pulmonary artery systolic pressure. 3. Left atrial size was moderately dilated. 4. The mitral valve is normal in structure. No evidence of mitral valve regurgitation. No evidence of mitral stenosis. 5. The aortic valve is tricuspid. There is mild calcification of the aortic valve. There is mild thickening of the aortic valve. Aortic valve regurgitation is mild. Aortic valve sclerosis is present, with no evidence of aortic valve stenosis. 6. The inferior vena cava is normal in size with greater than 50% respiratory variability, suggesting right atrial pressure of 3 mmHg.  FINDINGS Left Ventricle: Mid/distal septal hypokinesis. Left ventricular ejection fraction, by estimation, is 50 to 55%. The left ventricle has low normal function. The left ventricle demonstrates regional wall motion abnormalities. The left ventricular internal cavity size was normal in size. There is mild left ventricular hypertrophy. Left ventricular diastolic parameters were normal.  Right Ventricle: The right ventricular size is normal. No increase in right ventricular wall thickness. Right ventricular systolic function is normal. There is mildly elevated pulmonary artery systolic pressure. The tricuspid regurgitant velocity is 2.44 m/s, and with an assumed right atrial pressure of 15 mmHg, the estimated right ventricular systolic pressure is 38.8  mmHg.  Left Atrium: Left atrial size was moderately dilated.  Right Atrium: Right atrial size was normal in size.  Pericardium: There is no evidence of pericardial effusion.  Mitral Valve: The mitral valve is normal in structure. No evidence of mitral valve regurgitation. No evidence of mitral valve stenosis.  Tricuspid Valve: The tricuspid valve is normal in structure. Tricuspid valve regurgitation is not demonstrated. No evidence of tricuspid stenosis.  Aortic Valve: The aortic valve is tricuspid. There is mild calcification of the aortic valve. There is mild thickening of the aortic valve. Aortic valve regurgitation is mild. Aortic regurgitation PHT measures 456 msec. Aortic valve sclerosis is present, with no evidence of aortic valve stenosis. Aortic valve peak gradient measures 12.0 mmHg.  Pulmonic Valve: The pulmonic valve was normal in structure. Pulmonic valve regurgitation is not visualized. No evidence of pulmonic stenosis.  Aorta: The aortic root is normal in size and structure.  Venous: The inferior vena cava is normal in size with greater than 50% respiratory variability, suggesting right atrial pressure of 3 mmHg.  IAS/Shunts: No atrial level shunt detected by color flow Doppler.   LEFT VENTRICLE PLAX 2D LVIDd:         5.70 cm   Diastology LVIDs:         4.10 cm   LV e' medial:    5.87 cm/s LV PW:  1.20 cm   LV E/e' medial:  14.0 LV IVS:        1.40 cm   LV e' lateral:   8.60 cm/s LVOT diam:     2.00 cm   LV E/e' lateral: 9.5 LV SV:         89 LV SV Index:   41 LVOT Area:     3.14 cm   RIGHT VENTRICLE             IVC RV S prime:     10.20 cm/s  IVC diam: 2.40 cm TAPSE (M-mode): 2.2 cm  LEFT ATRIUM             Index        RIGHT ATRIUM           Index LA diam:        4.60 cm 2.11 cm/m   RA Area:     14.80 cm LA Vol (A2C):   66.0 ml 30.31 ml/m  RA Volume:   36.40 ml  16.71 ml/m LA Vol (A4C):   60.9 ml 27.96 ml/m LA Biplane Vol: 65.4 ml 30.03  ml/m AORTIC VALVE                 PULMONIC VALVE AV Area (Vmax): 2.34 cm     PV Vmax:       1.05 m/s AV Vmax:        173.00 cm/s  PV Peak grad:  4.4 mmHg AV Peak Grad:   12.0 mmHg LVOT Vmax:      129.00 cm/s LVOT Vmean:     70.400 cm/s LVOT VTI:       0.282 m AI PHT:         456 msec  AORTA Ao Root diam: 3.60 cm Ao Asc diam:  3.60 cm  MITRAL VALVE               TRICUSPID VALVE MV Area (PHT): 3.30 cm    TR Peak grad:   23.8 mmHg MV Decel Time: 230 msec    TR Vmax:        244.00 cm/s MV E velocity: 82.00 cm/s MV A velocity: 58.80 cm/s  SHUNTS MV E/A ratio:  1.39        Systemic VTI:  0.28 m Systemic Diam: 2.00 cm  Maude Emmer MD Electronically signed by Maude Emmer MD Signature Date/Time: 05/31/2022/10:38:01 AM    Final          ______________________________________________________________________________________________       Current Reported Medications:.    No outpatient medications have been marked as taking for the 10/19/24 encounter (Appointment) with Marshella Tello D, NP.    Physical Exam:    VS:  There were no vitals taken for this visit.   Wt Readings from Last 3 Encounters:  10/16/24 226 lb 13.7 oz (102.9 kg)  07/30/24 225 lb 12.8 oz (102.4 kg)  01/09/24 215 lb (97.5 kg)    GEN: Well nourished, well developed in no acute distress NECK: No JVD; No carotid bruits CARDIAC: ***RRR, no murmurs, rubs, gallops RESPIRATORY:  Clear to auscultation without rales, wheezing or rhonchi  ABDOMEN: Soft, non-tender, non-distended EXTREMITIES:  No edema; No acute deformity     Asessement and Plan:.     ***     Disposition: F/u with ***  Signed, Zephyra Bernardi D Rigoberto Repass, NP

## 2024-10-17 NOTE — TOC CM/SW Note (Signed)
 Transition of Care (TOC) CM/SW Note    CSW attached financial resources to patients AVS.

## 2024-10-17 NOTE — Op Note (Signed)
° ° °  Patient name: Alice Bennett MRN: [id number] DOB: [birth year] Sex: female  10/17/2024 Pre-operative Diagnosis: Left lower extremity acute limb ischemia undergoing lysis Post-operative diagnosis:  Same Surgeon:  Fonda FORBES Rim, MD Procedure Performed: 1.  Left lower extremity angiography 2.  Third order cannulation x 2 delete that third order cannulation x 2, selective superficial femoral artery and popliteal artery.   3.  3 x 150 balloon angioplasty superficial femoral artery, popliteal artery, tibioperoneal trunk 4.  Device assisted closure-Mynx Moderate sedation time 80 minutes, contrast volume   Indications: Patient is a 63 year old female who presented with left lower extremity blue toe syndrome.  Catheter directed thrombolysis was initiated yesterday.  She presents for recheck.  Findings:  Widely patent common femoral artery.  There is thrombus in the profunda, however there is distal flow.  Superficial femoral artery is patent with thrombus in the mid SFA.  Small amount of thrombus in both the popliteal artery as well as the tibioperoneal trunk creating flow-limiting stenosis greater than 70%.  Two-vessel runoff to the foot, posterior tibial and peroneal arteries.   Procedure:  The patient was identified in the holding area and taken to room 8.  The patient was then placed supine on the table and prepped and draped in the usual sterile fashion.  A time out was called.    The case began with removal of the thrombolysis catheter over a 0.018 wire.  Left lower extremity angiogram followed.  I had difficulty performing angiography with enough contrast due to significant inflow from the femoral-femoral bypass.  I used a 0.035 wire over the wire for selective angiography of the superficial femoral artery and tibioperoneal trunk to assess lesions and outflow..  I elected to intervene on the superficial femoral artery as well as the popliteal and tibioperoneal trunk.  The superficial  femoral artery was very small.  I elected to use a 3 mm x 150 mm balloon in an effort to plasty of the remaining thrombus against the walls of the artery.  The balloon was inflated from the proximal portion of the superficial femoral artery through the superficial femoral artery, popliteal artery and tibioperoneal trunk.  Follow-up angiography demonstrated excellent result.  There was no flow-limiting stenosis appreciated.  Distal flow intact-two-vessel runoff to the foot via the posterior tibial and peroneal arteries.  I had difficulty deploying a ProGlide device, therefore elected to close with a minx device.  Fonda FORBES Rim MD [facility] Office: [phone]

## 2024-10-17 NOTE — Consult Note (Addendum)
 NAME:  Alice Bennett, MRN:  [id number], DOB:  [birth year], LOS: 2 ADMISSION DATE:  10/15/2024, CONSULTATION DATE:  10/17/2024 REFERRING MD:  Dr. Fairy, CHIEF COMPLAINT:  Critical Limb ischemia    History of Present Illness:  63 year old female with a past medical history of CAD status post drug-eluting stent to LAD 2018, PAD (status post left right femoral-femoral bypass 2024, right fourth toe amputation), type 2 diabetes, hypertension, hyperlipidemia, anxiety who presented to the emergency department with concerns of toe discoloration on the left side.  Patient found to have critical limb ischemia of left lower extremity.  Vascular surgery consulted patient had angiogram with left SFA occlusion that was successfully crossed.  Patient was placed on tPA at the site and admitted to the ICU overnight.  PCCM consulted for medical management.  Pertinent  Medical History  CAD status post drug-eluting stent to LAD 2018, PAD, type 2 diabetes, hypertension, hyperlipidemia, anxiety, tobacco use  Significant Hospital Events: Including procedures, antibiotic start and stop dates in addition to other pertinent events   12/9: Angiography with SFA occlusion successfully crossed and placed on overnight tPA through catheter  Interim History / Subjective:  Overnight events: Nauseated overnight  Patient evaluated bedside this morning.  Patient states she had nausea overnight.  She otherwise states she is feeling okay.  Her pain in her left leg has improved.  Objective   Blood pressure (!) 181/71, pulse 66, temperature 98.1 F (36.7 C), temperature source Oral, resp. rate 13, height 5' 9 (1.753 m), weight 102.9 kg, SpO2 97%.        Intake/Output Summary (Last 24 hours) at 10/17/2024 9361 Last data filed at 10/17/2024 0400 Gross per 24 hour  Intake 602.81 ml  Output --  Net 602.81 ml   Filed Weights   10/16/24 0924  Weight: 102.9 kg   Vitals:  Temperature: Afebrile Pulse: 66 Respirations  13 Blood pressure 133/63-186/69 (181/71) Satting at 97% on room air  Examination: General: Resting in bed, no acute distress HENT: Normocephalic, atraumatic Lungs: Clear to auscultation bilateral Cardiovascular: Regular rate and rhythm, no murmurs, rubs, or gallops Abdomen: Soft, nontender Extremities: Left lower extremity cooler than right lower extremity, do have minimal Doppler pulses on left lower extremity Neuro: Alert and oriented x 3, able to follow all instructions GU: No Foley in place  CBC: Hemoglobin 13.4, platelets 161  Resolved Hospital Problem list     Assessment & Plan:  [name] with a past medical history of type 2 diabetes mellitus, hyperlipidemia, PAD s/p fem-fem bypass in 2024, hypertension, tobacco use who presents to the emergency department concerns of left toe discoloration and found to have critical limb ischemia now status post intervention with vascular surgery.  #Left SFA occlusion status post intervention 10/16/2024 on tPA infusion #Critical limb ischemia #PAD Patient is status post intervention on SFA occlusion.  Patient is currently on tPA infusion.  On exam this morning patient does have cool left lower extremity compared to right, but patient is able to feel as well as move lower extremity.  Vascular following.  Will follow along. - Continue aspirin  81 mg daily - Continue heparin  drip - Continue tPA infusion per vascular - Continue Lipitor  80 mg daily - Continue Zetia  10 mg daily - Continue ticagrelor  90 mg daily - Monitor pulses -Vascular following, appreciate recommendations  #CAD status post drug-eluting stent 2018 No acute concern for ACS during hospitalization.  Patient is doing well from the standpoint. - Continue aspirin  81 mg daily -  Continue Lipitor  80 mg daily - Continue Coreg  6.25 mg twice daily - Continue irbesartan  150 mg daily - Continue Zetia  10 mg daily  #Type 2 diabetes mellitus #Diabetic  neuropathy Patient with past medical history of diabetes.  A1c 7.0 yesterday.  Close to goal.  Patient is currently on sliding scale insulin .  Glucoses been ranging between 96-157.  Home medication includes Farxiga  10 mg daily and Ozempic 2 mg weekly - Continue sliding scale while here - Hold home medication - Monitor CBGs - Continue home pregabalin  150 mg daily  #Hypokalemia Patient had hypokalemia yesterday to 3.2.  It was repleted. Will recheck BMP today. - Follow-up BMP  #Hypertension Patient has a past medical history of hypertension.  Home medication includes olmesartan  20 mg daily and carvedilol  6.25 mg twice daily.  Blood pressures here are elevated today. - Resuming home ARB (hospital formulary irbesartan  150 mg daily) - Resume home carvedilol  6.5 mg twice daily - Monitor blood pressures closely  #Hyperlipidemia Lipid panel 2 months ago showed total cholesterol 183, triglycerides 122, LDL 117.  LDL is not at goal.  Patient is on Zetia  as well as atorvastatin .  If patient does not get down to goal, patient might need PCSK9 outpatient. - Continue atorvastatin  80 mg daily - Continue Zetia  10 mg daily  #Tobacco use Tobacco cessation encouraged  #Anxiety Past medical history of anxiety. - Resume home Klonopin  1 mg twice daily - Resume home sertraline  100 mg daily  Best Practice (right click and Reselect all SmartList Selections daily)   Diet/type: NPO DVT prophylaxis: systemic heparin  GI prophylaxis: N/A Lines: N/A Foley:  N/A Code Status:  full code   Labs   CBC: Recent Labs  Lab 10/15/24 1700 10/16/24 0453 10/16/24 1821 10/16/24 2302  WBC 8.0 6.0 7.8 7.2  NEUTROABS 5.2  --   --   --   HGB 14.3 13.0 14.4 13.4  HCT 44.3 40.2 43.7 40.2  MCV 89.5 90.3 89.4 87.0  PLT 197 174 166 161    Basic Metabolic Panel: Recent Labs  Lab 10/15/24 1700 10/16/24 0453  NA 140 140  K 4.0 3.2*  CL 104 104  CO2 27 27  GLUCOSE 142* 152*  BUN 8 7*  CREATININE 0.91  0.86  CALCIUM  9.2 8.3*   GFR: Estimated Creatinine Clearance: 85.5 mL/min (by C-G formula based on SCr of 0.86 mg/dL). Recent Labs  Lab 10/15/24 1700 10/16/24 0453 10/16/24 1821 10/16/24 2302  WBC 8.0 6.0 7.8 7.2    Liver Function Tests: No results for input(s): AST, ALT, ALKPHOS, BILITOT, PROT, ALBUMIN in the last 168 hours. No results for input(s): LIPASE, AMYLASE in the last 168 hours. No results for input(s): AMMONIA in the last 168 hours.  ABG    Component Value Date/Time   PHART 7.400 05/31/2022 1055   PCO2ART 39.6 05/31/2022 1055   PO2ART 77 (L) 05/31/2022 1055   HCO3 25.6 05/31/2022 1056   TCO2 25 04/22/2023 0858   O2SAT 62 05/31/2022 1056     Coagulation Profile: No results for input(s): INR, PROTIME in the last 168 hours.  Cardiac Enzymes: No results for input(s): CKTOTAL, CKMB, CKMBINDEX, TROPONINI in the last 168 hours.  HbA1C: Hgb A1c MFr Bld  Date/Time Value Ref Range Status  10/16/2024 04:53 AM 7.0 (H) 4.8 - 5.6 % Final    Comment:    (NOTE) Diagnosis of Diabetes The following HbA1c ranges recommended by the American Diabetes Association (ADA) may be used as an aid in the diagnosis of  diabetes mellitus.  Hemoglobin             Suggested A1C NGSP%              Diagnosis  <5.7                   Non Diabetic  5.7-6.4                Pre-Diabetic  >6.4                   Diabetic  <7.0                   Glycemic control for                       adults with diabetes.    10/26/2023 06:02 AM 6.3 (H) 4.8 - 5.6 % Final    Comment:    (NOTE) Pre diabetes:          5.7%-6.4%  Diabetes:              >6.4%  Glycemic control for   <7.0% adults with diabetes     CBG: Recent Labs  Lab 10/16/24 0803 10/16/24 1303 10/16/24 1752 10/16/24 2113 10/17/24 0609  GLUCAP 178* 140* 96 129* 157*    Review of Systems:   Negative except for what is stated in HPI  Past Medical History:  She,  has a past medical  history of CAD (coronary artery disease), Diabetes mellitus without complication (HCC), Hyperlipidemia, Hypertension, and NSTEMI (non-ST elevated myocardial infarction) (HCC).   Surgical History:   Past Surgical History:  Procedure Laterality Date   ABDOMINAL AORTOGRAM W/LOWER EXTREMITY N/A 07/26/2022   Procedure: ABDOMINAL AORTOGRAM W/LOWER EXTREMITY;  Surgeon: Court Dorn PARAS, MD;  Location: [facility];  Service: Cardiovascular;  Laterality: N/A;   ABDOMINAL AORTOGRAM W/LOWER EXTREMITY N/A 04/22/2023   Procedure: ABDOMINAL AORTOGRAM W/LOWER EXTREMITY;  Surgeon: Magda Debby SAILOR, MD;  Location: [facility];  Service: Cardiovascular;  Laterality: N/A;   AMPUTATION TOE Right 10/27/2023   Procedure: AMPUTATION TOE;  Surgeon: Malvin Marsa FALCON, DPM;  Location: [facility];  Service: Orthopedics/Podiatry;  Laterality: Right;  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   AORTOGRAM Bilateral 02/05/2023   Procedure: AORTOGRAM WITH RUN OFF;  Surgeon: Serene Gaile ORN, MD;  Location: [facility];  Service: Vascular;  Laterality: Bilateral;   APPLICATION OF WOUND VAC Bilateral 05/04/2023   Procedure: APPLICATION OF WOUND VAC;  Surgeon: Serene Gaile ORN, MD;  Location: [facility];  Service: Vascular;  Laterality: Bilateral;   CORONARY PRESSURE/FFR STUDY N/A 07/10/2017   Procedure: INTRAVASCULAR PRESSURE WIRE/FFR STUDY;  Surgeon: Jordan, Peter M, MD;  Location: [facility];  Service: Cardiovascular;  Laterality: N/A;   CORONARY PRESSURE/FFR STUDY N/A 05/31/2022   Procedure: INTRAVASCULAR PRESSURE WIRE/FFR STUDY;  Surgeon: Mady Bruckner, MD;  Location: [facility];  Service: Cardiovascular;  Laterality: N/A;   CORONARY STENT INTERVENTION N/A 07/10/2017   Procedure: CORONARY STENT INTERVENTION;  Surgeon: Jordan, Peter M, MD;  [facility];  Service: Cardiovascular;  Laterality: N/A;   CORONARY STENT INTERVENTION N/A 05/31/2022   Procedure: CORONARY STENT INTERVENTION;  Surgeon: Mady Bruckner, MD;  [facility];  Service: Cardiovascular;  Laterality: N/A;   FEMORAL-FEMORAL BYPASS GRAFT Bilateral 05/04/2023   Procedure: LEFT TO RIGHT FEMORAL-FEMORAL ARTERY BYPASS;  Surgeon: Serene Gaile ORN, MD;  [facility]: [facility];  Service:  Vascular;  Laterality: Bilateral;   INSERTION OF ILIAC STENT Right 02/05/2023   Procedure: INSERTION OF RIGHT ILIAC STENT;  Surgeon: Serene Gaile ORN, MD;  [facility];  Service: Vascular;  Laterality: Right;   LEFT HEART CATH AND CORONARY ANGIOGRAPHY N/A 07/10/2017   Procedure: LEFT HEART CATH AND CORONARY ANGIOGRAPHY;  Surgeon: Jordan, Peter M, MD;  Location: [facility] INVASIVE CV LAB;  Service: Cardiovascular;  Laterality: N/A;   LOWER EXTREMITY ANGIOGRAM N/A 02/05/2023   Procedure: RIGHT LOWER EXTREMITY ANGIOGRAM;  Surgeon: Serene Gaile ORN, MD;  [facility];  Service: Vascular;  Laterality: N/A;   PERIPHERAL VASCULAR INTERVENTION  07/26/2022   Procedure: PERIPHERAL VASCULAR INTERVENTION;  Surgeon: Court Dorn PARAS, MD;  [facility];  Service: Cardiovascular;;   PERIPHERAL VASCULAR INTERVENTION  04/22/2023   Procedure: PERIPHERAL VASCULAR INTERVENTION;  Surgeon: Magda Debby SAILOR, MD;  [facility];  Service: Cardiovascular;;  right common and ecternal iliac, right common femoral   RIGHT/LEFT HEART CATH AND CORONARY ANGIOGRAPHY N/A 05/31/2022   Procedure: RIGHT/LEFT HEART CATH AND CORONARY ANGIOGRAPHY;  Surgeon: Mady Bruckner, MD;  [facility];  Service: Cardiovascular;  Laterality: N/A;   ULTRASOUND GUIDANCE FOR VASCULAR ACCESS Bilateral 02/05/2023   Procedure: ULTRASOUND GUIDANCE FOR VASCULAR ACCESS;  Surgeon: Serene Gaile ORN, MD;  [facility]: [facility];  Service: Vascular;  Laterality: Bilateral;     Social History:   reports that she quit smoking about 18 months ago. Her smoking use included cigarettes. She has a 5 pack-year smoking history. She has been exposed to tobacco smoke. She has  never used smokeless tobacco. She reports that she does not drink alcohol and does not use drugs.   Family History:  Her family history is not on file.   Allergies Allergies  Allergen Reactions   Codeine Itching     Home Medications  Prior to Admission medications   Medication Sig Start Date End Date Taking? Authorizing Provider  aspirin  EC 81 MG tablet Take 81 mg by mouth daily.   Yes [provider]  atorvastatin  (LIPITOR ) 80 MG tablet Take 1 tablet (80 mg total) by mouth daily at 6 PM. 07/13/24  Yes Court Dorn PARAS, MD  BRILINTA  90 MG TABS tablet TAKE 1 TABLET(90 MG) BY MOUTH TWICE DAILY Patient taking differently: Take 90 mg by mouth in the morning and at bedtime. 03/07/24  Yes Serene Gaile ORN, MD  carvedilol  (COREG ) 6.25 MG tablet Take 1 tablet (6.25 mg total) by mouth 2 (two) times daily with a meal. 07/19/17  Yes Burnard Debby LABOR, MD  Cholecalciferol  (D3 HIGH POTENCY) 125 MCG (5000 UT) capsule Take 5,000 Units by mouth daily.   Yes [provider]  clonazePAM  (KLONOPIN ) 1 MG tablet Take 2 mg by mouth at bedtime.   Yes [provider]  ezetimibe  (ZETIA ) 10 MG tablet Take 1 tablet (10 mg total) by mouth daily. 07/13/24  Yes Court Dorn PARAS, MD  FARXIGA  10 MG TABS tablet Take 10 mg by mouth daily.  03/28/19  Yes [provider]  fluticasone (FLONASE) 50 MCG/ACT nasal spray Place 1 spray into both nostrils 2 (two) times daily as needed for allergies or rhinitis.   Yes [provider]  nitroGLYCERIN  (NITROSTAT ) 0.4 MG SL tablet Place 0.4 mg under the tongue every 5 (five) minutes as needed for chest pain.   Yes [provider]  olmesartan  (BENICAR ) 20 MG tablet Take 1 tablet (20 mg total) by mouth daily. 03/09/21  Yes Burnard Debby LABOR, MD  OZEMPIC, 2 MG/DOSE, 8 MG/3ML SOPN Inject 2 mg into the skin every Sunday. 02/01/23  Yes [provider]  pantoprazole  (PROTONIX ) 40 MG tablet Take 1 tablet (40 mg total) by mouth daily. Patient  taking differently: Take 40 mg by mouth daily before breakfast. 07/19/17  Yes Burnard Debby LABOR, MD  POTASSIUM PO Take 1 tablet by mouth at bedtime.   Yes [provider]  pregabalin  (LYRICA ) 150 MG capsule Take 450 mg by mouth at bedtime.   Yes [provider]  sertraline  (ZOLOFT ) 100 MG tablet Take 100 mg by mouth daily.   Yes [provider]     Critical care time:     Libby Blanch, DO Internal Medicine Resident PGY-3

## 2024-10-17 NOTE — Discharge Instructions (Addendum)
 Emergency Financial Assistance  [facility] Services Emergency Financial Assistance, Food Pantry, Homeless Shelters, and [employer] Information Phone: [phone] Location [address] [place], [place], [place] [address] Eligibility Varies by program; contact directly for details Hours of Operation Monday-Friday 8am-5pm  Cost/Fees None Referral Appointments are recommended for financial assistance; walk-in accepted for food services  Low Income Energy Assistance Program Services One-time payment for heating bills eligible low-income households. Contact Information Contact for this department will be through your local Department of Social Services (DSS) Location Statewide program in [place]  Eligibility Must be low income, meet the household size requirement and only for heating source Hours of Operation Application period; December-March Cost/Fees None  Referral Please contact your local DSS for application  Public Safety Victim Advertising Copywriter, Counseling, and Solicitor for Crime Victims Contact Information Phone: [phone] // Toll free: [phone]  Website: [email or web address].[email or web address]/ Location No Office Location Eligibility Crime must be previously reported to law enforcement and application must be submitted through their website Hours of Operation Monday-Friday 8am-5pm  Cost/Fees None Referral Contact directly for application process or visit their website  [address] [employer] Food Pantry, Software Engineer, [employer], and Financial Planner Information Phone: [phone] Email: info@[facility] .org Website: [email or web address] Location [address] [place], [place] [address] Eligibility Varies by program; contact directly for details Hours of Operation Varies by program; contact directly for details Cost/Fees None   Referral Appointment and Walk-In Options Available [facility] Services Temporary Housing , Programme Researcher, Broadcasting/film/video, and [employer] Information Phone: [phone] Website: talkingapps.com.br Location [address], [place], [place] [address] Eligibility Varies by service; please contact directly for details Hours of Operation Monday- Friday 9am-5pm  Cost/Fees None Referral Walk-Ins Accepted  [facility] Services Emergency Shelter, Food Pantry, [employer], and Museum/gallery Curator Information Phone: [phone] Location [address], [place], [place] [address] Eligibility Must be a [place] resident Hours of Operation Monday-Friday 8am-4:40pm Cost/Fees None  Referral Call to make an appointment on Wednesdays from 7:30am until spots are filled.  [employer], Transport Planner, Homeless Shelter, and [employer] Information Phone: [phone] Email: [email or web address] Website: [email or web address] Location [address], [place], [place] [address] Eligibility Varies by service Hours of Operation Monday- Friday 9am-5pm  Cost/Fees None Referral Call your local [facility] to make appoints; walk-ins are welcomed  [facility] [place] Services Food Pantry, Corporate Investment Banker, and Corporate Investment Banker Information Phone: [phone] Email: [email or web address] Website: [email or web address].[facility].org Location [address], [place], [place] [address] Eligibility Must meet the income requirement and have proof of residency Hours of Operation Monday- Friday 9am-12pm  Cost/Fees None Referral Walk-Ins Available  [facility] Basic Needs Resource Guide Services Database for Housing, Food, Surveyor, Quantity, and Physicist, Medical Information Phone: [phone] Website: www.http://harris-peterson.info/ Location No office  location Eligibility Varies by program  Hours of Operation 27/7 online access  Cost/Fees None Referral No Referral Needed   Information on my medicine - ELIQUIS (apixaban)  Why was Eliquis prescribed for you? Eliquis was prescribed for you to reduce the risk of forming blood clots that can cause a stroke if you have a medical condition called atrial fibrillation (a type of irregular heartbeat) OR to reduce the risk of a blood clots forming after orthopedic surgery.  What do You need to know about Eliquis ? Take your Eliquis TWICE DAILY - one tablet in the  morning and one tablet in the evening with or without food.  It would be best to take the doses about the same time each day.  If you have difficulty swallowing the tablet whole please discuss with your pharmacist how to take the medication safely.  Take Eliquis exactly as prescribed by your doctor and DO NOT stop taking Eliquis without talking to the doctor who prescribed the medication.  Stopping may increase your risk of developing a new clot or stroke.  Refill your prescription before you run out.  After discharge, you should have regular check-up appointments with your healthcare provider that is prescribing your Eliquis.  In the future your dose may need to be changed if your kidney function or weight changes by a significant amount or as you get older.  What do you do if you miss a dose? If you miss a dose, take it as soon as you remember on the same day and resume taking twice daily.  Do not take more than one dose of ELIQUIS at the same time.  Important Safety Information A possible side effect of Eliquis is bleeding. You should call your healthcare provider right away if you experience any of the following: Bleeding from an injury or your nose that does not stop. Unusual colored urine (red or dark brown) or unusual colored stools (red or black). Unusual bruising for unknown reasons. A serious fall or if you hit  your head (even if there is no bleeding).  Some medicines may interact with Eliquis and might increase your risk of bleeding or clotting while on Eliquis. To help avoid this, consult your healthcare provider or pharmacist prior to using any new prescription or non-prescription medications, including herbals, vitamins, non-steroidal anti-inflammatory drugs (NSAIDs) and supplements.  This website has more information on Eliquis (apixaban): http://www.eliquis.com/eliquis/home   Ms. Alice Bennett. Frentz,  It was a pleasure taking care of you at [facility]. You were admitted for a clot in your leg.  You were treated with blood thinners. You also required intervention with a catheter. We are discharging you home now that you are doing better. Please follow the following instructions.   1) Please follow-up with your Primary care physician in 1-2 weeks. We have stopped your Brilinta , and started you on a new medicine called Eliquis.  Continue taking aspirin  81 mg daily.  Remember when you are filling your pill box please do not put the Brilinta  in the pillbox.  2) Please follow-up with your vascular surgeon.  The appointment seems to be on 11/19/2024  3) If you develop worsening discoloration of your foot, please inform your vascular surgeon and come to the emergency department  4) If you develop any dark or tarry stools or notice bright red blood in your stool, please come to the emergency department as we have started you on a blood thinner called Eliquis.  This can increase your risk of bleeding.  Do not miss any doses of your Eliquis.  Continue taking aspirin  81 mg daily. If you fall and hit your head on this medication please call your primary care doctor and let them know what happened and how you feel.   5) We have reduced your carvedilol  to 3.125 mg twice daily and we have stopped your olmesartan .  Discuss with your primary care provider if you should resume your olmesartan  at your  follow-up visit.  Take care,  Dr. Libby Blanch, DO

## 2024-10-18 ENCOUNTER — Telehealth (HOSPITAL_COMMUNITY): Payer: Self-pay

## 2024-10-18 ENCOUNTER — Other Ambulatory Visit (HOSPITAL_COMMUNITY): Payer: Self-pay

## 2024-10-18 ENCOUNTER — Encounter (HOSPITAL_COMMUNITY): Payer: Self-pay | Admitting: Vascular Surgery

## 2024-10-18 DIAGNOSIS — D649 Anemia, unspecified: Secondary | ICD-10-CM

## 2024-10-18 DIAGNOSIS — E1165 Type 2 diabetes mellitus with hyperglycemia: Secondary | ICD-10-CM

## 2024-10-18 DIAGNOSIS — E785 Hyperlipidemia, unspecified: Secondary | ICD-10-CM

## 2024-10-18 LAB — CBC
HCT: 35.8 % — ABNORMAL LOW (ref 36.0–46.0)
Hemoglobin: 11.5 g/dL — ABNORMAL LOW (ref 12.0–15.0)
MCH: 28.6 pg (ref 26.0–34.0)
MCHC: 32.1 g/dL (ref 30.0–36.0)
MCV: 89.1 fL (ref 80.0–100.0)
Platelets: 134 K/uL — ABNORMAL LOW (ref 150–400)
RBC: 4.02 MIL/uL (ref 3.87–5.11)
RDW: 15.8 % — ABNORMAL HIGH (ref 11.5–15.5)
WBC: 6.6 K/uL (ref 4.0–10.5)
nRBC: 0 % (ref 0.0–0.2)

## 2024-10-18 LAB — GLUCOSE, CAPILLARY
Glucose-Capillary: 153 mg/dL — ABNORMAL HIGH (ref 70–99)
Glucose-Capillary: 127 mg/dL — ABNORMAL HIGH (ref 70–99)
Glucose-Capillary: 120 mg/dL — ABNORMAL HIGH (ref 70–99)
Glucose-Capillary: 119 mg/dL — ABNORMAL HIGH (ref 70–99)

## 2024-10-18 LAB — TYPE AND SCREEN
ABO/RH(D): A POS
Antibody Screen: NEGATIVE

## 2024-10-18 LAB — LIPID PANEL
Cholesterol: 146 mg/dL (ref 0–200)
HDL: 31 mg/dL — ABNORMAL LOW (ref >40)
LDL Cholesterol: 73 mg/dL (ref 0–99)
Total CHOL/HDL Ratio: 4.7 ratio
Triglycerides: 210 mg/dL — ABNORMAL HIGH (ref <150)
VLDL: 42 mg/dL — ABNORMAL HIGH (ref 0–40)

## 2024-10-18 LAB — BASIC METABOLIC PANEL WITH GFR
Anion gap: 7 (ref 5–15)
BUN: 12 mg/dL (ref 8–23)
CO2: 24 mmol/L (ref 22–32)
Calcium: 8.1 mg/dL — ABNORMAL LOW (ref 8.9–10.3)
Chloride: 107 mmol/L (ref 98–111)
Creatinine, Ser: 1.08 mg/dL — ABNORMAL HIGH (ref 0.44–1.00)
GFR, Estimated: 58 mL/min — ABNORMAL LOW (ref >60)
Glucose, Bld: 154 mg/dL — ABNORMAL HIGH (ref 70–99)
Potassium: 3.7 mmol/L (ref 3.5–5.1)
Sodium: 138 mmol/L (ref 135–145)

## 2024-10-18 LAB — MAGNESIUM: Magnesium: 2 mg/dL (ref 1.7–2.4)

## 2024-10-18 MED ORDER — LACTATED RINGERS IV BOLUS
1000.0000 mL | Freq: Once | INTRAVENOUS | Status: AC
  Administered 2024-10-18: 1000 mL via INTRAVENOUS

## 2024-10-18 MED ORDER — APIXABAN 5 MG PO TABS
5.0000 mg | ORAL_TABLET | Freq: Two times a day (BID) | ORAL | Status: DC
  Administered 2024-10-18 – 2024-10-19 (×3): 5 mg via ORAL
  Filled 2024-10-18: qty 1
  Filled 2024-10-18: qty 2
  Filled 2024-10-18: qty 1

## 2024-10-18 MED ORDER — NOREPINEPHRINE 4 MG/250ML-% IV SOLN: 0.0000 ug/min | INTRAVENOUS | Status: DC

## 2024-10-18 MED ORDER — SODIUM CHLORIDE 0.9 % IV SOLN: 250.0000 mL | INTRAVENOUS | Status: AC

## 2024-10-18 MED ORDER — CARVEDILOL 3.125 MG PO TABS
3.1250 mg | ORAL_TABLET | Freq: Two times a day (BID) | ORAL | Status: DC
  Administered 2024-10-18 – 2024-10-19 (×2): 3.125 mg via ORAL
  Filled 2024-10-18 (×2): qty 1

## 2024-10-18 MED ORDER — NITROGLYCERIN 0.2 MG/HR TD PT24
0.2000 mg | MEDICATED_PATCH | Freq: Every day | TRANSDERMAL | Status: DC
  Administered 2024-10-18 – 2024-10-19 (×2): 0.2 mg via TRANSDERMAL
  Filled 2024-10-18 (×2): qty 1

## 2024-10-18 NOTE — Progress Notes (Cosign Needed Addendum)
 NAME:  Alice Bennett, MRN:  [id number], DOB:  14-Jan-1961, LOS: 3 ADMISSION DATE:  10/15/2024, CONSULTATION DATE:  10/17/2024 REFERRING MD:  Dr. Fairy, CHIEF COMPLAINT:  Critical Limb ischemia    History of Present Illness:  63 year old female with a past medical history of CAD status post drug-eluting stent to LAD 2018, PAD (status post left right femoral-femoral bypass 2024, right fourth toe amputation), type 2 diabetes, hypertension, hyperlipidemia, anxiety who presented to the emergency department with concerns of toe discoloration on the left side.  Patient found to have critical limb ischemia of left lower extremity.  Vascular surgery consulted patient had angiogram with left SFA occlusion that was successfully crossed.  Patient was placed on tPA at the site and admitted to the ICU overnight.  PCCM consulted for medical management.  Pertinent  Medical History  CAD status post drug-eluting stent to LAD 2018, PAD, type 2 diabetes, hypertension, hyperlipidemia, anxiety, tobacco use  Significant Hospital Events: Including procedures, antibiotic start and stop dates in addition to other pertinent events   12/9: Angiography with SFA occlusion successfully crossed and placed on overnight tPA through catheter 12/10: Patient taken for angiography with third order cannulation x 2, balloon angioplasty to superior femoral artery, popliteal artery, tibioperoneal trunk, closed with a Minx device  Interim History / Subjective:  Overnight events: Patient became hypotensive overnight, given fluids, stat CBC did show drop of hemoglobin from 14 to 11.5  Patient evaluated bedside this morning.  Patient states she is feeling well.  She does report having some left toe pain, but this is improving.  She otherwise has no complaints this morning.  She does report that she has not had a bowel movement.  She endorses that she declined her stool softener as she just needs coffee to have a bowel movement.  She is  passing gas.  Objective   Blood pressure 112/73, pulse 63, temperature 98.3 F (36.8 C), temperature source Oral, resp. rate 14, height 5' 9 (1.753 m), weight 106.9 kg, SpO2 97%.        Intake/Output Summary (Last 24 hours) at 10/18/2024 9366 Last data filed at 10/18/2024 0500 Gross per 24 hour  Intake 4116.97 ml  Output 1300 ml  Net 2816.97 ml   Filed Weights   10/16/24 0924 10/18/24 0515  Weight: 102.9 kg 106.9 kg   Vitals:  Temperature: Afebrile Pulse: 60 Respirations 13-15 Blood pressure 75/64-103/29 Satting at 96% on 1 L [place]  Examination: General: Resting in recliner, no acute distress left HENT: Normocephalic, atraumatic Lungs: Clear to auscultation bilateral Cardiovascular: Regular rate and rhythm, no murmurs, rubs, or gallops Abdomen: Soft, nontender Extremities: Extremity much warmer today than yesterday.  Doppler pulses are obtained.  There is some blue discoloration to left great toe. Neuro: Alert and oriented x 3, able to follow all instructions GU: No Foley in place  CBC: Hemoglobin 11.5, platelets 134 Lipid panel: Triglycerides 210 BMP: Sodium 138, potassium 3.7, bicarb 24, creatinine 1.08  Resolved Hospital Problem list     Assessment & Plan:  [name] with a past medical history of type 2 diabetes mellitus, hyperlipidemia, PAD s/p fem-fem bypass in 2024, hypertension, tobacco use who presents to the emergency department concerns of left toe discoloration and found to have critical limb ischemia now status post intervention with vascular surgery.  #Left SFA occlusion status post intervention 10/16/2024 now off tPA infusion, also had balloon angioplasty on 12/10 #Critical limb ischemia #PAD Patient is status post repeat angiography with balloon  angioplasty with minx device closure.  Patient did become hypotensive overnight after procedure.  Required fluids.  Anticipate this is secondary to blood loss as hemoglobin did drop from 14 to  11.5.  Heparin  was stopped yesterday.  This morning, patient doing well.  Leg is warm and well-perfused.  There is some discoloration to left great toe, but otherwise foot is improving well. - Continue aspirin  81 mg daily, discussed with vascular and plan is to start eliquis today  - Continue Lipitor  80 mg daily - Continue Zetia  10 mg daily - Monitor pulses - Vascular following, appreciate recommendations - PT/OT following  #CAD status post drug-eluting stent 2018 No acute concern for ACS during hospitalization.  Patient is doing well from this standpoint. - Continue aspirin  81 mg daily - Continue Lipitor  80 mg daily - Continue Coreg  6.25 mg twice daily - Given hypotension and bump in creatinine, hold irbesartan  150 mg daily - Continue Zetia  10 mg daily  #Type 2 diabetes mellitus #Diabetic neuropathy Patient with past medical history of diabetes.  A1c 7.02 days ago.  Blood sugars measuring 118-153.  Measuring well.  Continue sliding scale. Home medication includes Farxiga  10 mg daily and Ozempic 2 mg weekly - Continue sliding scale while here - Hold home medication - Monitor CBGs - Continue home pregabalin  150 mg daily  #Elevated creatinine Creatinine elevated at 1.08 from 0.94 yesterday and 0.86 2 days ago.  Will need to monitor closely.  Patient did have a contrast. - Monitor BMP  #Normocytic anemia #Thrombocytopenia Likely in the setting blood loss from procedure.  Hemoglobin 11.5 this morning.  Platelets 134.  No acute signs of blood loss at this time. - Continue to monitor CBC  #Hypertension Patient has a past medical history of hypertension.  Became slightly hypotensive yesterday responded well to 2 L of normal saline.  Home medication includes olmesartan  20 mg daily and carvedilol  6.25 mg twice daily.  Will hold ARB today.  - Hold home ARB (hospital formulary irbesartan  150 mg daily) - decreased home carvedilol  to 3.125 mg twice daily - Monitor blood pressures  closely  #Hyperlipidemia #Hypertriglyceridemia Lipid panel yesterday showing triglycerides 210, LDL 73, total cholesterol 853.  Goal will be less than 70.  If patient does not get down to goal, patient might need PCSK9 outpatient. - Continue atorvastatin  80 mg daily - Continue Zetia  10 mg daily  #Tobacco use Patient reports that she used before and is no longer using tobacco  #Anxiety Past medical history of anxiety. - Resume home Klonopin  1 mg twice daily - Resume home sertraline  100 mg daily  #Constipation Patient endorses having bowel movements every other day.  She has not had one in a few days now.  Encouraged her to take her stool softeners.  She has not been taking her stool softeners. - Continue bowel regimen  Mobilize patient today.  Best Practice (right click and Reselect all SmartList Selections daily)   Diet/type: Regular consistency (see orders) and NPO DVT prophylaxis: SCD GI prophylaxis: N/A Lines: N/A Foley:  N/A Code Status:  full code   Labs   CBC: Recent Labs  Lab 10/15/24 1700 10/16/24 0453 10/16/24 1821 10/16/24 2302 10/17/24 0657 10/17/24 1106 10/18/24 0120  WBC 8.0   < > 7.8 7.2 8.4 9.1 6.6  NEUTROABS 5.2  --   --   --   --   --   --   HGB 14.3   < > 14.4 13.4 14.8 14.0 11.5*  HCT 44.3   < >  43.7 40.2 44.1 42.4 35.8*  MCV 89.5   < > 89.4 87.0 87.8 87.4 89.1  PLT 197   < > 166 161 169 196 134*   < > = values in this interval not displayed.    Basic Metabolic Panel: Recent Labs  Lab 10/15/24 1700 10/16/24 0453 10/17/24 0657 10/18/24 0120  NA 140 140 136 138  K 4.0 3.2* 4.1 3.7  CL 104 104 103 107  CO2 27 27 25 24   GLUCOSE 142* 152* 161* 154*  BUN 8 7* 7* 12  CREATININE 0.91 0.86 0.94 1.08*  CALCIUM  9.2 8.3* 8.7* 8.1*  MG  --   --  1.6* 2.0   GFR: Estimated Creatinine Clearance: 69.4 mL/min (A) (by C-G formula based on SCr of 1.08 mg/dL (H)). Recent Labs  Lab 10/16/24 2302 10/17/24 0657 10/17/24 1106 10/18/24 0120  WBC  7.2 8.4 9.1 6.6    Liver Function Tests: No results for input(s): AST, ALT, ALKPHOS, BILITOT, PROT, ALBUMIN in the last 168 hours. No results for input(s): LIPASE, AMYLASE in the last 168 hours. No results for input(s): AMMONIA in the last 168 hours.  ABG    Component Value Date/Time   PHART 7.400 05/31/2022 1055   PCO2ART 39.6 05/31/2022 1055   PO2ART 77 (L) 05/31/2022 1055   HCO3 25.6 05/31/2022 1056   TCO2 25 04/22/2023 0858   O2SAT 62 05/31/2022 1056     Coagulation Profile: No results for input(s): INR, PROTIME in the last 168 hours.  Cardiac Enzymes: No results for input(s): CKTOTAL, CKMB, CKMBINDEX, TROPONINI in the last 168 hours.  HbA1C: Hgb A1c MFr Bld  Date/Time Value Ref Range Status  10/16/2024 04:53 AM 7.0 (H) 4.8 - 5.6 % Final    Comment:    (NOTE) Diagnosis of Diabetes The following HbA1c ranges recommended by the American Diabetes Association (ADA) may be used as an aid in the diagnosis of diabetes mellitus.  Hemoglobin             Suggested A1C NGSP%              Diagnosis  <5.7                   Non Diabetic  5.7-6.4                Pre-Diabetic  >6.4                   Diabetic  <7.0                   Glycemic control for                       adults with diabetes.    10/26/2023 06:02 AM 6.3 (H) 4.8 - 5.6 % Final    Comment:    (NOTE) Pre diabetes:          5.7%-6.4%  Diabetes:              >6.4%  Glycemic control for   <7.0% adults with diabetes     CBG: Recent Labs  Lab 10/17/24 1203 10/17/24 1515 10/17/24 1742 10/17/24 2214 10/18/24 0627  GLUCAP 131* 115* 118* 142* 153*    Review of Systems:   Negative except for what is stated in HPI  Past Medical History:  She,  has a past medical history of CAD (coronary artery disease), Diabetes mellitus without complication (HCC), Hyperlipidemia, Hypertension, and NSTEMI (non-ST elevated  myocardial infarction) (HCC).   Surgical History:   Past  Surgical History:  Procedure Laterality Date   ABDOMINAL AORTOGRAM W/LOWER EXTREMITY N/A 07/26/2022   Procedure: ABDOMINAL AORTOGRAM W/LOWER EXTREMITY;  Surgeon: Court Dorn PARAS, MD;  Location: [facility];  Service: Cardiovascular;  Laterality: N/A;   ABDOMINAL AORTOGRAM W/LOWER EXTREMITY N/A 04/22/2023   Procedure: ABDOMINAL AORTOGRAM W/LOWER EXTREMITY;  Surgeon: Magda Debby SAILOR, MD;  Location: [facility];  Service: Cardiovascular;  Laterality: N/A;   AMPUTATION TOE Right 10/27/2023   Procedure: AMPUTATION TOE;  Surgeon: Malvin Marsa FALCON, DPM;  Location: [facility];  Service: Orthopedics/Podiatry;  Laterality: Right;  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   AORTOGRAM Bilateral 02/05/2023   Procedure: AORTOGRAM WITH RUN OFF;  Surgeon: Serene Gaile ORN, MD;  Location: [facility];  Service: Vascular;  Laterality: Bilateral;   APPLICATION OF WOUND VAC Bilateral 05/04/2023   Procedure: APPLICATION OF WOUND VAC;  Surgeon: Serene Gaile ORN, MD;  Location: [facility];  Service: Vascular;  Laterality: Bilateral;   CORONARY PRESSURE/FFR STUDY N/A 07/10/2017   Procedure: INTRAVASCULAR PRESSURE WIRE/FFR STUDY;  Surgeon: Jordan, Peter M, MD;  Location: [facility];  Service: Cardiovascular;  Laterality: N/A;   CORONARY PRESSURE/FFR STUDY N/A 05/31/2022   Procedure: INTRAVASCULAR PRESSURE WIRE/FFR STUDY;  Surgeon: Mady Bruckner, MD;  Location: [facility];  Service: Cardiovascular;  Laterality: N/A;   CORONARY STENT INTERVENTION N/A 07/10/2017   Procedure: CORONARY STENT INTERVENTION;  Surgeon: Jordan, Peter M, MD;  Location: [facility];  Service: Cardiovascular;  Laterality: N/A;   CORONARY STENT INTERVENTION N/A 05/31/2022   Procedure: CORONARY STENT INTERVENTION;  Surgeon: Mady Bruckner, MD;  [facility]: [facility];  Service: Cardiovascular;  Laterality: N/A;   FEMORAL-FEMORAL BYPASS GRAFT Bilateral 05/04/2023   Procedure: LEFT TO RIGHT FEMORAL-FEMORAL ARTERY BYPASS;   Surgeon: Serene Gaile ORN, MD;  Location: [facility];  Service: Vascular;  Laterality: Bilateral;   INSERTION OF ILIAC STENT Right 02/05/2023   Procedure: INSERTION OF RIGHT ILIAC STENT;  Surgeon: Serene Gaile ORN, MD;  Location: [facility];  Service: Vascular;  Laterality: Right;   LEFT HEART CATH AND CORONARY ANGIOGRAPHY N/A 07/10/2017   Procedure: LEFT HEART CATH AND CORONARY ANGIOGRAPHY;  Surgeon: Jordan, Peter M, MD;  Location: [facility] INV[facility] CV LAB;  Service: Cardiovascular;  Laterality: N/A;   LOWER EXTREMITY ANGIOGRAM N/A 02/05/2023   Procedure: RIGHT LOWER EXTREMITY ANGIOGRAM;  Surgeon: Serene Gaile ORN, MD;  Location: [facility];  Service: Vascular;  Laterality: N/A;   LOWER EXTREMITY ANGIOGRAPHY N/A 10/16/2024   Procedure: Lower Extremity Angiography;  Surgeon: Serene Gaile ORN, MD;  [facility];  Service: Cardiovascular;  Laterality: N/A;   PERIPHERAL VASCULAR INTERVENTION  07/26/2022   Procedure: PERIPHERAL VASCULAR INTERVENTION;  Surgeon: Court Dorn PARAS, MD;  [facility];  Service: Cardiovascular;;   PERIPHERAL VASCULAR INTERVENTION  04/22/2023   Procedure: PERIPHERAL VASCULAR INTERVENTION;  Surgeon: Magda Debby SAILOR, MD;  [facility];  Service: Cardiovascular;;  right common and ecternal iliac, right common femoral   PERIPHERAL VASCULAR THROMBECTOMY  10/16/2024   Procedure: PERIPHERAL VASCULAR THROMBECTOMY;  Surgeon: Serene Gaile ORN, MD;  [facility];  Service: Cardiovascular;;   RIGHT/LEFT HEART CATH AND CORONARY ANGIOGRAPHY N/A 05/31/2022   Procedure: RIGHT/LEFT HEART CATH AND CORONARY ANGIOGRAPHY;  Surgeon: Mady Bruckner, MD;  [facility];  Service: Cardiovascular;  Laterality: N/A;   ULTRASOUND GUIDANCE FOR VASCULAR ACCESS Bilateral  02/05/2023   Procedure: ULTRASOUND GUIDANCE FOR VASCULAR ACCESS;  Surgeon: Serene Gaile ORN, MD;  Location: [facility] OR;  Service: Vascular;  Laterality: Bilateral;     Social History:    reports that she quit smoking about 18 months ago. Her smoking use included cigarettes. She has a 5 pack-year smoking history. She has been exposed to tobacco smoke. She has never used smokeless tobacco. She reports that she does not drink alcohol and does not use drugs.   Family History:  Her family history is not on file.   Allergies Allergies  Allergen Reactions   Codeine Itching     Home Medications  Prior to Admission medications   Medication Sig Start Date End Date Taking? Authorizing Provider  aspirin  EC 81 MG tablet Take 81 mg by mouth daily.   Yes [provider]  atorvastatin  (LIPITOR ) 80 MG tablet Take 1 tablet (80 mg total) by mouth daily at 6 PM. 07/13/24  Yes Court Dorn PARAS, MD  BRILINTA  90 MG TABS tablet TAKE 1 TABLET(90 MG) BY MOUTH TWICE DAILY Patient taking differently: Take 90 mg by mouth in the morning and at bedtime. 03/07/24  Yes Serene Gaile ORN, MD  carvedilol  (COREG ) 6.25 MG tablet Take 1 tablet (6.25 mg total) by mouth 2 (two) times daily with a meal. 07/19/17  Yes Burnard Debby LABOR, MD  Cholecalciferol  (D3 HIGH POTENCY) 125 MCG (5000 UT) capsule Take 5,000 Units by mouth daily.   Yes [provider]  clonazePAM  (KLONOPIN ) 1 MG tablet Take 2 mg by mouth at bedtime.   Yes [provider]  ezetimibe  (ZETIA ) 10 MG tablet Take 1 tablet (10 mg total) by mouth daily. 07/13/24  Yes Court Dorn PARAS, MD  FARXIGA  10 MG TABS tablet Take 10 mg by mouth daily.  03/28/19  Yes [provider]  fluticasone (FLONASE) 50 MCG/ACT nasal spray Place 1 spray into both nostrils 2 (two) times daily as needed for allergies or rhinitis.   Yes [provider]  nitroGLYCERIN  (NITROSTAT ) 0.4 MG SL tablet Place 0.4 mg under the tongue every 5 (five) minutes as needed for chest pain.   Yes [provider]  olmesartan  (BENICAR ) 20 MG tablet Take 1 tablet (20 mg total) by mouth daily. 03/09/21  Yes Burnard Debby LABOR, MD  OZEMPIC, 2 MG/DOSE, 8 MG/3ML  SOPN Inject 2 mg into the skin every Sunday. 02/01/23  Yes [provider]  pantoprazole  (PROTONIX ) 40 MG tablet Take 1 tablet (40 mg total) by mouth daily. Patient taking differently: Take 40 mg by mouth daily before breakfast. 07/19/17  Yes Burnard Debby LABOR, MD  POTASSIUM PO Take 1 tablet by mouth at bedtime.   Yes [provider]  pregabalin  (LYRICA ) 150 MG capsule Take 450 mg by mouth at bedtime.   Yes [provider]  sertraline  (ZOLOFT ) 100 MG tablet Take 100 mg by mouth daily.   Yes [provider]     Critical care time:     Libby Blanch, DO Internal Medicine Resident PGY-3

## 2024-10-18 NOTE — Telephone Encounter (Signed)
 Pharmacy Patient Advocate Encounter  Insurance verification completed.    The patient is insured through [employer].     Ran test claim for Xarelto  20mg  tablet and the current 30 day co-pay is $4.  Ran test claim for Eliquis 5mg  tablet and the current 30 day co-pay is $4.  This test claim was processed through [employer]- copay amounts may vary at other pharmacies due to [employer], or as the patient moves through the different stages of their insurance plan.

## 2024-10-18 NOTE — Progress Notes (Signed)
° ° °  Subjective  - POD # 1, 2, status post thrombolytic therapy and angioplasty  Her foot feels better today however she is still having pain in her left great toe   Physical Exam:  Brisk posterior tibial and peroneal Doppler signals Mottling and ischemic changes persist on the left great toe however the remaining toes and foot have dramatically improved       Assessment/Plan:  POD #1,2  Blue toe syndrome (left): I discussed with the patient that I will change her medication by starting her on Eliquis and discontinuing her Brilinta .  She will also need a baby aspirin .  -She has requested a postop shoe to help with her walking which we will provide  -I have recommended that we continue to observe her left great toe.  If she continues to have discomfort and pain, the only option would be amputation however I would like to delay this into the outpatient setting.  I am also going to get her a nitroglycerin  patch to place on the toe to see if this will help with pain and her perfusion.  -She potentially could be discharged later today.  If she spends night she will need to go to [employer] 10/18/2024 8:26 AM --  Vitals:   10/18/24 0715 10/18/24 0730  BP: 126/61 (!) 130/92  Pulse: (!) 59 (!) 55  Resp: 13 13  Temp:    SpO2: 95% 96%    Intake/Output Summary (Last 24 hours) at 10/18/2024 0826 Last data filed at 10/18/2024 0730 Gross per 24 hour  Intake 4124.83 ml  Output 750 ml  Net 3374.83 ml     Laboratory CBC    Component Value Date/Time   WBC 6.6 10/18/2024 0120   HGB 11.5 (L) 10/18/2024 0120   HGB 13.8 07/07/2022 1531   HCT 35.8 (L) 10/18/2024 0120   HCT 41.8 07/07/2022 1531   PLT 134 (L) 10/18/2024 0120   PLT 192 07/07/2022 1531    BMET    Component Value Date/Time   NA 138 10/18/2024 0120   NA 142 07/07/2022 1531   K 3.7 10/18/2024 0120   CL 107 10/18/2024 0120   CO2 24 10/18/2024 0120   GLUCOSE 154 (H) 10/18/2024 0120   BUN 12  10/18/2024 0120   BUN 8 07/07/2022 1531   CREATININE 1.08 (H) 10/18/2024 0120   CALCIUM  8.1 (L) 10/18/2024 0120   GFRNONAA 58 (L) 10/18/2024 0120   GFRAA 53 (L) 06/09/2020 0331    COAG Lab Results  Component Value Date   INR 0.93 07/10/2017   No results found for: PTT  Antibiotics Anti-infectives (From admission, onward)    None        V. Malvina Serene CLORE, M.D., [facility] [place] Office: [phone] Pager:  [phone]

## 2024-10-18 NOTE — Progress Notes (Signed)
 eLink Physician-Brief Progress Note Patient Name: Alice Bennett DOB: Jun 05, 1961 MRN: [id number]   Date of Service  10/18/2024  HPI/Events of Note  Hypotension.  eICU Interventions  Stat H &H and Type & screen, Saline bolus, Levophed gtt.        Lizzette Carbonell U Shy Guallpa 10/18/2024, 12:54 AM

## 2024-10-18 NOTE — Progress Notes (Signed)
 Alice Bennett is a 63 y/o woman with a history of CAD , DM2, PAD with previous fem-fem bypass, HTN, HLD who presented with critical limb ischemia in LLE requiring thrombectomy,catheter directed thrombolytics. Today she has ongoing pain in her cyanotic left first toe.    BP (!) 148/59 (BP Location: Left Arm)   Pulse 66   Temp 98.3 F (36.8 C) (Oral)   Resp 15   Ht 5' 9 (1.753 m)   Wt 106.9 kg   SpO2 97%   BMI 34.80 kg/m   Chronically ill appearing woman lying in bed in NAD [place]/AT, eyes anicteric Breathing comfortably on RA, CTAB S1S2, RRR Abd soft, NT No edema, L first toe cyanosis but not cold. Has NTG patch in place.  BUN 12 Cr 1.08 WBC 6.6 H/H 11.5/35.8 Platelets 134  BG 110-150s  Assessment & plan:  PAD with acute LLE ischemia due to occluded SFA, s/p thrombectomy & catheter directed lysis h/o fem-fem bypass Purple toe -appreciate VVS management -apixaban, aspirin , statin -NTG patch for toe; letting it demarcate. OP follow up with VVS to determine if it is viable. -norco PRN -goal SBP <160; hydralazine  & labetalol  PRN  Anemia -transfuse for Hb <7 or hemodynamically significant bleeding -monitor  Hyperglycemia -SSI PRN -goal BG 140-180  Former tobacco abuse -encouraged to continue to avoid this  HLD -zetia , statin  Difficulty with mobility due to pain -norco PRN -PT, OT   Stable to transfer to the floor and back to [facility] care tomorrow.   Alice SHAUNNA Gaskins, DO 10/18/2024 7:43 PM [facility]  For contact information, see Amion. If no response to pager, please call PCCM consult pager. After hours, 7PM- 7AM, please call Elink.

## 2024-10-18 NOTE — Hospital Course (Addendum)
 A) Left SFA occlusion status post intervention with tPA infusion and balloon angioplasty: Patient discharged on aspirin  as well as Eliquis.  Patient has vascular surgery appointment on 11/19/2024.   B.  Hypertension: Patient resumed home medications outpatient.  Continue titrating medications outpatient.  C.  Hyperlipidemia: Checked lipid panel during hospitalization LDL not at goal at 73.  Patient is on Zetia  as well as of atorvastatin  80.  Will need to follow-up lipid panel in 3 to 6 months.  If not at goal patient may need PCSK9   2.  Labs / imaging needed at time of follow-up: {Labs:13245}  3.  Pending labs/ test needing follow-up: ***  4.  Medication Changes  1) Patient started on Eliquis 5 mg twice daily   Follow-up Appointments:    Hospital Course by problem list: [name] with a past medical history of type 2 diabetes mellitus, hyperlipidemia, PAD s/p fem-fem bypass in 2024, hypertension, tobacco use who presents to the emergency department concerns of left toe discoloration and found to have critical limb ischemia now status post intervention with vascular surgery.   #Left SFA occlusion status post intervention 2024/10/18 now off tPA infusion, also had balloon angioplasty on 12/10 #Critical limb ischemia #PAD Patient initially presented to the emergency department on 10/15/2024 for concerns of left great toe discoloration.  Patient found to have an acute left SFA occlusion and was taken for intervention.  Initial intervention was on 2024/10/18 in which patient had passed through of clot with catheter directed tPA.  The next procedure was an angiography with third order cannulation x 2, balloon angioplasty to superior femoral artery, popliteal artery, tibioperoneal trunk, closed with a Minx device.  Patient was started on aspirin  as well as heparin .  Ultimately was transition to aspirin  and Eliquis.  Patient continued Lipitor  and Zetia  inpatient.  Patient progressed well  and work with PT/OT.    #CAD status post drug-eluting stent 2018 Patient has past medical history of CAD with drug-eluting stent in 2018.  During hospitalization there is no concern for ACS.  Patient continued aspirin , Lipitor  during hospitalization.  Coreg  was decreased to 3.125 mg twice daily in the setting of hypertension during hospitalization.  Irbesartan  was discontinued.  At discharge***   #Type 2 diabetes mellitus #Diabetic neuropathy During hospitalization held home Farxiga  10 mg daily and Ozempic 2 mg weekly.  Patient continue sliding scale while inpatient.  A1c was 7.0.  Discharged on home medications.    #Elevated creatinine During hospitalization creatinine did elevate to 1.08.  Patient was encouraged to have p.o. intake.  This is likely in setting of contrast as well as decreased p.o. intake.  Discharge creatinine***.   #Normocytic anemia #Thrombocytopenia Patient did have anemia and thrombocytopenia during hospitalization.  Likely in the setting blood loss from procedure.  Did not require blood products.  Patient mains stable.  At discharge hemoglobin was***.  #Hypertension Patient was initially resumed on home medications including carvedilol  3.125 mg twice daily and olmesartan  20 mg daily ( hospital formulary irbesartan  150 mg daily).  During hospitalization patient did become hypotensive regarding carvedilol  to be decreased to 3.125 mg twice daily and discontinued irbesartan .  At discharge***  #Hyperlipidemia #Hypertriglyceridemia Lipid panel during hospitalization showing triglycerides 210, LDL 73, total cholesterol 853.  Goal will be less than 70.  If patient does not get down to goal, patient might need PCSK9 outpatient.  Continued Zetia  and atorvastatin  outpatient.   #Tobacco use During hospitalization tobacco use was assessed.  Patient no  longer using.   #Anxiety During hospitalization patient was restarted on her home Klonopin  and sertraline .    #Constipation During hospitalization patient had constipation.  Optimize bowel regimen and patient bowel movement.    Ms. Cecily Lawhorne. Solimine,  It was a pleasure taking care of you at [facility]. You were admitted for a clot in your leg.  You were treated with blood thinners.  He also required intervention with a catheter. We are discharging you home now that you are doing better. Please follow the following instructions.   1) Please follow-up with your [facility] care physician in 1-2 weeks  2) Please follow-up with your vascular surgeon.  The appointment seems to be on 11/19/2024  3) If you develop worsening discoloration of your foot, please inform your vascular surgeon and come to the emergency department  4) If you develop any dark or tarry stools or notice bright red blood in your stool, please come to the emergency department as we have started you on a blood thinner called Eliquis.  This can increase your risk of bleeding.  Do not miss any doses of your Eliquis.  Continue taking aspirin  81 mg daily.  Take care,  Dr. PIERRETTE

## 2024-10-18 NOTE — Plan of Care (Signed)
°  Problem: Skin Integrity: Goal: Risk for impaired skin integrity will decrease Outcome: Progressing   Problem: Clinical Measurements: Goal: Will remain free from infection Outcome: Progressing   Problem: Nutrition: Goal: Adequate nutrition will be maintained Outcome: Progressing   Problem: Elimination: Goal: Will not experience complications related to urinary retention Outcome: Progressing   Problem: Pain Managment: Goal: General experience of comfort will improve and/or be controlled Outcome: Progressing   Problem: Skin Integrity: Goal: Risk for impaired skin integrity will decrease Outcome: Progressing

## 2024-10-19 ENCOUNTER — Ambulatory Visit: Admitting: Cardiology

## 2024-10-19 ENCOUNTER — Other Ambulatory Visit (HOSPITAL_COMMUNITY): Payer: Self-pay

## 2024-10-19 DIAGNOSIS — E1151 Type 2 diabetes mellitus with diabetic peripheral angiopathy without gangrene: Secondary | ICD-10-CM

## 2024-10-19 LAB — BASIC METABOLIC PANEL WITH GFR
Anion gap: 6 (ref 5–15)
BUN: 15 mg/dL (ref 8–23)
CO2: 26 mmol/L (ref 22–32)
Calcium: 8.3 mg/dL — ABNORMAL LOW (ref 8.9–10.3)
Chloride: 105 mmol/L (ref 98–111)
Creatinine, Ser: 1.04 mg/dL — ABNORMAL HIGH (ref 0.44–1.00)
GFR, Estimated: >60 mL/min (ref >60)
Glucose, Bld: 131 mg/dL — ABNORMAL HIGH (ref 70–99)
Potassium: 3.7 mmol/L (ref 3.5–5.1)
Sodium: 137 mmol/L (ref 135–145)

## 2024-10-19 LAB — CBC
HCT: 35.1 % — ABNORMAL LOW (ref 36.0–46.0)
Hemoglobin: 11.6 g/dL — ABNORMAL LOW (ref 12.0–15.0)
MCH: 29.2 pg (ref 26.0–34.0)
MCHC: 33 g/dL (ref 30.0–36.0)
MCV: 88.4 fL (ref 80.0–100.0)
Platelets: 161 K/uL (ref 150–400)
RBC: 3.97 MIL/uL (ref 3.87–5.11)
RDW: 15.6 % — ABNORMAL HIGH (ref 11.5–15.5)
WBC: 6.5 K/uL (ref 4.0–10.5)
nRBC: 0 % (ref 0.0–0.2)

## 2024-10-19 LAB — GLUCOSE, CAPILLARY
Glucose-Capillary: 133 mg/dL — ABNORMAL HIGH (ref 70–99)
Glucose-Capillary: 123 mg/dL — ABNORMAL HIGH (ref 70–99)

## 2024-10-19 MED ORDER — CARVEDILOL 3.125 MG PO TABS
6.2500 mg | ORAL_TABLET | Freq: Two times a day (BID) | ORAL | 1 refills | Status: AC
  Filled 2024-10-19: qty 120, 30d supply, fill #0

## 2024-10-19 MED ORDER — APIXABAN 5 MG PO TABS
5.0000 mg | ORAL_TABLET | Freq: Two times a day (BID) | ORAL | 1 refills | Status: DC
  Filled 2024-10-19: qty 60, 30d supply, fill #0

## 2024-10-19 MED ORDER — HYDROCODONE-ACETAMINOPHEN 5-325 MG PO TABS
1.0000 | ORAL_TABLET | ORAL | 0 refills | Status: AC | PRN
  Filled 2024-10-19: qty 10, 1d supply, fill #0

## 2024-10-19 MED ORDER — ASPIRIN EC 81 MG PO TBEC
81.0000 mg | DELAYED_RELEASE_TABLET | Freq: Every day | ORAL | 1 refills | Status: AC
  Filled 2024-10-19: qty 30, 30d supply, fill #0

## 2024-10-19 MED ORDER — NITROGLYCERIN 0.2 MG/HR TD PT24
0.2000 mg | MEDICATED_PATCH | Freq: Every day | TRANSDERMAL | 0 refills | Status: DC
  Filled 2024-10-19: qty 20, 20d supply, fill #0

## 2024-10-19 NOTE — Progress Notes (Signed)
 PT Cancellation Note  Patient Details Name: Alice Bennett MRN: [id number] DOB: 20-Mar-1961   Cancelled Treatment:    Reason Eval/Treat Not Completed: (P) OT screened, no needs identified, will sign off OT reports pt is independent.  Stephaney Steven B. Fleeta Lapidus PT, DPT Acute Rehabilitation Services Please use secure chat or  Call Office [phone]    [name] 10/19/2024, 10:10 AM

## 2024-10-19 NOTE — Discharge Summary (Signed)
 Discharge Summary  Name: Alice Bennett MRN: [id number] DOB: [birth year] 63 y.o. PCP: Alice Angeline JULIANNA, NP  Date of Admission: 10/15/2024  3:58 PM Date of Discharge: 10/19/2024 Attending Physician: Dr. Leita Gaskins   Discharge Diagnosis: Principal Problem:   Critical limb ischemia of left lower extremity [facility]) Active Problems:   Hyperlipidemia associated with type 2 diabetes mellitus (HCC)   Hypertension associated with diabetes (HCC)   Tobacco use   Peripheral arterial disease   Type 2 diabetes mellitus with complication, without long-term current use of insulin  (HCC)   CAD (coronary artery disease)   Anxiety    Discharge Medications: Allergies as of 10/19/2024       Reactions   Codeine Itching        Medication List     PAUSE taking these medications    olmesartan  20 MG tablet Wait to take this until your doctor or other care provider tells you to start again. Commonly known as: BENICAR  Take 1 tablet (20 mg total) by mouth daily.       STOP taking these medications    Brilinta  90 MG Tabs tablet Generic drug: ticagrelor    POTASSIUM PO       TAKE these medications    apixaban 5 MG Tabs tablet Commonly known as: ELIQUIS Take 1 tablet (5 mg total) by mouth 2 (two) times daily.   aspirin  EC 81 MG tablet Take 1 tablet (81 mg total) by mouth daily.   atorvastatin  80 MG tablet Commonly known as: LIPITOR  Take 1 tablet (80 mg total) by mouth daily at 6 PM.   carvedilol  3.125 MG tablet Commonly known as: COREG  Take 2 tablets (6.25 mg total) by mouth 2 (two) times daily with a meal. What changed: medication strength   clonazePAM  1 MG tablet Commonly known as: KLONOPIN  Take 2 mg by mouth at bedtime.   D3 High Potency 125 MCG (5000 UT) capsule Generic drug: Cholecalciferol  Take 5,000 Units by mouth daily.   ezetimibe  10 MG tablet Commonly known as: ZETIA  Take 1 tablet (10 mg total) by mouth daily.   Farxiga   10 MG Tabs tablet Generic drug: dapagliflozin  propanediol Take 10 mg by mouth daily.   fluticasone 50 MCG/ACT nasal spray Commonly known as: FLONASE Place 1 spray into both nostrils 2 (two) times daily as needed for allergies or rhinitis.   HYDROcodone -acetaminophen  5-325 MG tablet Commonly known as: NORCO/VICODIN Take 1-2 tablets by mouth every 4 (four) hours as needed for up to 3 days for moderate pain (pain score 4-6).   nitroGLYCERIN  0.4 MG SL tablet Commonly known as: NITROSTAT  Place 0.4 mg under the tongue every 5 (five) minutes as needed for chest pain. What changed: Another medication with the same name was added. Make sure you understand how and when to take each.   nitroGLYCERIN  0.2 mg/hr patch Commonly known as: NITRODUR - Dosed in mg/24 hr Place 1 patch (0.2 mg total) onto the skin daily. What changed: You were already taking a medication with the same name, and this prescription was added. Make sure you understand how and when to take each.   Ozempic (2 MG/DOSE) 8 MG/3ML Sopn Generic drug: Semaglutide (2 MG/DOSE) Inject 2 mg into the skin every Sunday.   pantoprazole  40 MG tablet Commonly known as: PROTONIX  Take 1 tablet (40 mg total) by mouth daily. What changed: when to take this   pregabalin  150 MG capsule Commonly known as: LYRICA  Take 450 mg by mouth at bedtime.   sertraline  100 MG  tablet Commonly known as: ZOLOFT  Take 100 mg by mouth daily.        Disposition and follow-up:   Alice Bennett was discharged from [facility] in Stable condition.  At the hospital follow up visit please address:  1.  Follow-up: A\. Left SFA occlusion status post intervention with tPA infusion and balloon angioplasty: Patient discharged on aspirin  as well as Eliquis.  Patient has vascular surgery appointment on 11/19/2024.  Vascular surgery recommending follow-up in 1 month with arterial studies.   B.  Hypertension: BP at DC 137s systolics.During the  hospital stay patient did have some low BP measurements requiring fluids. ARB was stopped and coreg  was decreased to 3.125 mg BID.  Titrate outpatient  C.  Hyperlipidemia: Checked lipid panel during hospitalization LDL not at goal at 73.  Patient is on Zetia  as well as of atorvastatin  80.  Will need to follow-up lipid panel in 3 to 6 months.  If not at goal patient may need PCSK9  D. Elevated Creatinine: At DC patient had a creatinine of 1.04. Held ARB at discharge. Reccommended getting BMP at hospital follow up and resume ARB if improved.   2.  Labs / imaging needed at time of follow-up: BMP, CBC   3.  Pending labs/ test needing follow-up: N/A   4.  Medication Changes  1) Patient started on Eliquis 5 mg twice daily              2) Patient Coreg  decreased to 3.125 mg BID              3) Patient olmesartan  stopped in the setting of elevated creatinine    Follow-up Appointments:  Follow-up Information     [facility] Follow up in 5 week(s).   Specialty: Vascular Surgery Contact information: [address] [place]  [address] [phone]        Alice Angeline FALCON, NP. Call in 3 day(s).   Why: Please call your primary care physican to schedule a hospital follow up visit in the next 1-2 weeks. Contact information: [address] [place] [address] [phone]                 Hospital Course by problem list: [name] with a past medical history of type 2 diabetes mellitus, hyperlipidemia, PAD s/p fem-fem bypass in 2024, hypertension, tobacco use who presents to the emergency department concerns of left toe discoloration and found to have critical limb ischemia now status post intervention with vascular surgery.   #Left SFA occlusion status post intervention 10-18-2024 now off tPA infusion, also had balloon angioplasty on 12/10 #Critical limb ischemia #PAD Patient  initially presented to the emergency department on 10/15/2024 for concerns of left great toe discoloration.  Patient found to have an acute left SFA occlusion and was taken for intervention.  Initial intervention was on 10/18/2024 in which patient had passed through of clot with catheter directed tPA.  The next procedure was an angiography with third order cannulation x 2, balloon angioplasty to superior femoral artery, popliteal artery, tibioperoneal trunk, closed with a Minx device.  Patient was started on aspirin  as well as heparin .  Ultimately was transition to aspirin  and Eliquis.  Patient continued Lipitor  and Zetia  inpatient.  Patient progressed well and work with PT/OT.  Patient discharged on aspirin , Eliquis, Lipitor , and Zetia .  Plans to follow-up with  vascular surgery in 1 month with arterial studies.  Discharged with some pain meds at home.  #CAD status post drug-eluting stent 2018 Patient has past medical history of CAD with drug-eluting stent in 2018.  During hospitalization there is no concern for ACS.  Patient continued aspirin , Lipitor  during hospitalization.  Coreg  was decreased to 3.125 mg twice daily in the setting of hypertension during hospitalization.  Irbesartan  was discontinued.  At discharge Coreg  was decreased to 3.125 mg twice daily.  Resume Jardiance at discharge.  Held olmesartan  at discharge.   #Type 2 diabetes mellitus #Diabetic neuropathy During hospitalization held home Farxiga  10 mg daily and Ozempic 2 mg weekly.  Patient continue sliding scale while inpatient.  A1c was 7.0.  Discharged on home medications.    #Elevated creatinine During hospitalization creatinine did elevate to 1.08.  Patient was encouraged to have p.o. intake.  This is likely in setting of contrast as well as decreased p.o. intake.  Discharge creatinine 1.04.  Patient was stopped on olmesartan .   #Normocytic anemia #Thrombocytopenia Patient did have anemia and thrombocytopenia during hospitalization.   Likely in the setting blood loss from procedure.  Did not require blood products.  Patient remained stable during hospitalization.  At discharge hemoglobin was 11.6.  #Hypertension Patient was initially resumed on home medications including carvedilol  3.125 mg twice daily and olmesartan  20 mg daily ( hospital formulary irbesartan  150 mg daily).  During hospitalization patient did become hypotensive requiring fluids and carvedilol  was decreased to 3.125 mg twice daily and  irbesartan  discontinued.  At discharge patient was instructed to take carvedilol  3.125 mg twice daily and continue to hold home ARB (olmesartan  20 mg daily).  #Hyperlipidemia #Hypertriglyceridemia Lipid panel during hospitalization showing triglycerides 210, LDL 73, total cholesterol 853.  Goal will be less than 70.  If patient does not get down to goal, patient might need PCSK9 outpatient.  Continued Zetia  and atorvastatin  outpatient.   #Tobacco use During hospitalization tobacco use was assessed.  Patient no longer using.   #Anxiety During hospitalization patient was restarted on her home Klonopin  and sertraline .   #Constipation During hospitalization patient had constipation.  Optimize bowel regimen and patient bowel movement.  Discharge Subjective:  Patient evaluated bedside this morning.  She states she is feeling well.  Her pain is improved.  She has no concerns this morning.  She states she had a bowel movement.  She is eating well.  She is ready go home.  Discussed anticoagulation with patient and gave her clear instructions on things to look out for on anticoagulation such as GI bleed, falls, and  bruising.  Pharmacist in the room as well gave instructions.  Discharge Exam:   BP (!) 157/63 (BP Location: Right Arm)   Pulse (!) 59   Temp 97.9 F (36.6 C) (Oral)   Resp 16   Ht 5' 9 (1.753 m)   Wt 106.9 kg   SpO2 96%   BMI 34.80 kg/m  Constitutional: Sitting in bed, no acute distress HENT: normocephalic  atraumatic, mucous membranes moist Eyes: conjunctiva non-erythematous Cardiovascular: Bradycardic rate, normal rhythm, no m/r/g Pulmonary/Chest: normal work of breathing on room air, lungs clear to auscultation bilaterally Abdominal: soft, non-tender, non-distended Extremities: Nitropatch noted to the left great toe.  Blue discoloration noted to left great toe.  Pulses intact to bilateral lower extremities.  Both extremities are warm Neurological: Continuously moving all extremities  Pertinent Labs, Studies, and Procedures:     Latest Ref Rng & Units 10/19/2024    2:44  AM 10/18/2024    1:20 AM 10/17/2024   11:06 AM  CBC  WBC 4.0 - 10.5 K/uL 6.5  6.6  9.1   Hemoglobin 12.0 - 15.0 g/dL 88.3  88.4  85.9   Hematocrit 36.0 - 46.0 % 35.1  35.8  42.4   Platelets 150 - 400 K/uL 161  134  196        Latest Ref Rng & Units 10/19/2024    2:44 AM 10/18/2024    1:20 AM 10/17/2024    6:57 AM  CMP  Glucose 70 - 99 mg/dL 868  845  838   BUN 8 - 23 mg/dL 15  12  7    Creatinine 0.44 - 1.00 mg/dL 8.95  8.91  9.05   Sodium 135 - 145 mmol/L 137  138  136   Potassium 3.5 - 5.1 mmol/L 3.7  3.7  4.1   Chloride 98 - 111 mmol/L 105  107  103   CO2 22 - 32 mmol/L 26  24  25    Calcium  8.9 - 10.3 mg/dL 8.3  8.1  8.7     PERIPHERAL VASCULAR CATHETERIZATION Result Date: 10/16/2024 Images from the original result were not included. Patient name: Alice Bennett MRN: [id number] DOB: 1961-04-04 Sex: female 10/15/2024 - 10/16/2024 Pre-operative Diagnosis: Left blue toe Post-operative diagnosis:  Same Surgeon:  Malvina New Procedure Performed:  1.  U/S guided access, femoral-femoral bypass  2.  Left leg angiogram  3.  Selective injection, left popliteal artery  4.  Placement of lysis catheter  5.  Conscious sedation, 30 minutes  Indications:  This is 63 year old female with history of right iliac stenting which ultimately occluded, necessating a left to right fem-fem.  She now presents with a left blue toe and left  SFA occlusion seen on CTA.  Her symptoms are 75 weeks old Procedure:  The patient was identified in the holding area and taken to room 8.  The patient was then placed supine on the table and prepped and draped in the usual sterile fashion.  A time out was called.  Conscious sedation was administered with the use of IV fentanyl  and Versed  under continuous physician and nurse monitoring.  Heart rate, blood pressure, and oxygen saturation were continuously monitored.  Total sedation time was 30 minutes.  Ultrasound was used to evaluate the fem-fem bypass.  The graft was cannulated with a micropuncture needle.  A 018 wire was inserted followed by placement of a micropuncture sheath.  Next, a Glidewire advantage was placed followed by a 5 French sheath.  A quick cross catheter was advanced into the common femoral artery on the left and selective injections were performed. Findings:   Left Lower Extremity: The left common femoral and profundofemoral arteries were widely patent.  The superficial femoral artery is patent however it occludes in the mid thigh for approximately 10 cm.  There is reconstitution of the above-knee popliteal artery with two-vessel runoff via the posterior tibial and peroneal artery. Intervention: Next, a 6 French by 15 cm sheath was inserted.  The patient was fully heparinized.  Using a Glidewire advantage and the quick cross catheter, the wire easily went across the occlusion.  A selective injection was then performed with the catheter in the popliteal artery, confirming crossing of the lesion.  Because of how easy the wire went across the lesion and because of her blue toe, I elected to leave a UniFuse 50 cm catheter for overnight tPA infusion.  The catheter was  sutured in place. Impression:  #1  Left SFA occlusion, successfully crossed.  A 50 cm infusion length UniFuse catheter was placed for overnight administration of tPA  V. Malvina New, M.D., FACS [facility]  Office: [phone] Pager:  [phone]   CT Angio Aortobifemoral W and/or Wo Contrast Result Date: 10/15/2024 CLINICAL DATA:  Left great toe pain for 2 weeks, erythema EXAM: CT ANGIOGRAPHY OF ABDOMINAL AORTA WITH ILIOFEMORAL RUNOFF TECHNIQUE: Multidetector CT imaging of the abdomen, pelvis and lower extremities was performed using the standard protocol during bolus administration of intravenous contrast. Multiplanar CT image reconstructions and MIPs were obtained to evaluate the vascular anatomy. RADIATION DOSE REDUCTION: This exam was performed according to the departmental dose-optimization program which includes automated exposure control, adjustment of the mA and/or kV according to patient size and/or use of iterative reconstruction technique. CONTRAST:  OMNIPAQUE  IOHEXOL  350 MG/ML SOLN COMPARISON:  04/29/2023 FINDINGS: VASCULAR Aorta: Normal caliber aorta without aneurysm, dissection, vasculitis or significant stenosis. Moderate atheromatous plaque throughout the abdominal aorta. Celiac: Patent without evidence of aneurysm, dissection, vasculitis or significant stenosis. SMA: There stenosis at the origin of the SMA, extending for approximately 3.5 cm from the origin, estimated 70-90% stenosis. The more distal SMA is widely patent. No aneurysm, dissection, or vasculitis. Renals: Both renal arteries are patent without evidence of aneurysm, dissection, vasculitis, fibromuscular dysplasia or significant stenosis. Atherosclerosis at the origin of the left renal artery with less than 50% stenosis. IMA: There is occlusion of the origin of the IMA, with distal reconstitution beyond the first order branches via collaterals from the SMA distribution. RIGHT Lower Extremity Inflow: Vascular stents are seen throughout the right common iliac and external iliac distribution, with chronic occlusion of the right common iliac and external iliac arteries. Diminutive patent right internal iliac artery opacifies via  collateral vessels. Outflow: Normal opacification of a femoral-femoral bypass graft. Common femoral artery opacifies normally. The superficial femoral and profundus femoral arteries are diminutive but widely patent. Mild atherosclerosis of the popliteal artery without critical stenosis. Runoff: The anterior tibial artery is only identified and its proximal extent, and is not well visualized beyond the proximal calf. Posterior tibial and peroneal arteries opacify normally through the level of the ankle. There is distal reconstitution of the anterior tibial artery at the level ankle via collaterals. LEFT Lower Extremity Inflow: There is atherosclerosis within the proximal left common iliac artery extending approximately 3 cm from its origin, with mild stenosis approaching 50%. Distal left common iliac, external iliac, and internal iliac arteries are patent. Outflow: Patent femoral-femoral bypass graft is identified. The left superficial femoral and profundus femoral arteries are somewhat diminutive, but are patent at their origins. There is segmental occlusion of the left SFA within the mid thigh, extending approximately 8 cm in length. There is distal reconstitution of the SMA as it exits Hunter's canal via collateral vessels from the profundus femoral. The left popliteal artery is patent. Runoff: The anterior tibial artery is identified at its origin, but is otherwise not well visualized throughout the remainder of its course. The peroneal and posterior tibial arteries opacify normally to the level of the ankle. Veins: No obvious venous abnormality within the limitations of this arterial phase study. Review of the MIP images confirms the above findings. NON-VASCULAR Lower chest: No acute pleural or parenchymal lung disease. Hepatobiliary: No focal liver abnormality is seen. No gallstones, gallbladder wall thickening, or biliary dilatation. Pancreas: Unremarkable. No pancreatic ductal dilatation or surrounding  inflammatory changes. Spleen: Normal  in size without focal abnormality. Adrenals/Urinary Tract: Stable 1.9 cm left adrenal adenoma. The right adrenal is unremarkable. Kidneys enhance normally. No urinary tract calculi or obstructive uropathy within either kidney. The bladder is unremarkable. Stomach/Bowel: No bowel obstruction or ileus. Prior appendectomy. No bowel wall thickening or inflammatory change. Lymphatic: No pathologic adenopathy within the abdomen or pelvis. Reproductive: Uterus and bilateral adnexa are unremarkable. Other: No free fluid or free intraperitoneal gas. No abdominal wall hernia. Musculoskeletal: No acute or destructive bony abnormalities. Prior right fourth digit amputation. Reconstructed images demonstrate no additional findings. IMPRESSION: VASCULAR 1. Stable atheromatous plaque throughout the abdominal aorta, without significant stenosis. 2. 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 3. 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 4. Patent femoral-femoral bypass graft. Right lower extremity: 1. 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 2. Diminutive but patent right SFA and popliteal arteries. 3. Patent 2 vessel runoff to the ankle, with occlusion of the anterior tibial artery beyond its origin. Left lower extremity: 1. New segmental occlusion of the left SFA within the mid thigh, extending approximately 8 cm in length. Distal reconstitution via collaterals from the profundus femoral system. 2. Patent 2 vessel runoff to the ankle, with occlusion of the anterior tibial artery beyond its origin. NON-VASCULAR 1. No acute intra-abdominal or intrapelvic process. 2. Stable left adrenal adenoma. Electronically Signed   By: Ozell Daring M.D.   On: 10/15/2024 18:39   DG Foot Complete Left Result Date: 10/15/2024 CLINICAL DATA:  Pain and discoloration of the great toe EXAM: LEFT FOOT  - COMPLETE 3+ VIEW COMPARISON:  None Available. FINDINGS: There is no evidence of fracture or dislocation. There is no evidence of arthropathy or other focal bone abnormality. Soft tissues are unremarkable. IMPRESSION: Negative. Electronically Signed   By: Luke Bun M.D.   On: 10/15/2024 17:19     Discharge Instructions: Discharge Instructions     Call MD for:  difficulty breathing, headache or visual disturbances   Complete by: As directed    Call MD for:  persistant dizziness or light-headedness   Complete by: As directed    Call MD for:  persistant nausea and vomiting   Complete by: As directed    Call MD for:  redness, tenderness, or signs of infection (pain, swelling, redness, odor or green/yellow discharge around incision site)   Complete by: As directed    Call MD for:  temperature >100.4   Complete by: As directed    Discharge instructions   Complete by: As directed    Alice Bennett,  It was a pleasure taking care of you at [facility]. You were admitted for a clot in your leg.  You were treated with blood thinners. You also required intervention with a catheter. We are discharging you home now that you are doing better. Please follow the following instructions.   1) Please follow-up with your Primary care physician in 1-2 weeks. We have stopped your Brilinta , and started you on a new medicine called Eliquis.  Continue taking aspirin  81 mg daily.  Remember when you are filling your pill box please do not put the Brilinta  in the pillbox.  2) Please follow-up with your vascular surgeon.  The appointment seems to be on 11/19/2024  3) If you develop worsening discoloration of your foot, please inform your vascular surgeon and come to the emergency department  4) If you develop  any dark or tarry stools or notice bright red blood in your stool, please come to the emergency department as we have started you on a blood thinner called Eliquis.  This can increase your risk  of bleeding.  Do not miss any doses of your Eliquis.  Continue taking aspirin  81 mg daily. If you fall and hit your head on this medication please call your primary care doctor and let them know what happened and how you feel.   5) We have reduced your carvedilol  to 3.125 mg twice daily and we have stopped your olmesartan .  Discuss with your primary care provider if you should resume your olmesartan  at your follow-up visit.  Take care,  Dr. Libby Blanch, DO   Increase activity slowly   Complete by: As directed    No wound care   Complete by: As directed        Signed: Blanch Libby, DO 10/19/2024, 1:46 PM   Internal Medicine Resident PGY-3

## 2024-10-19 NOTE — Progress Notes (Signed)
°  Progress Note    10/19/2024 6:44 AM 2 Days Post-Op  Subjective:  sleeping, wakes easily.  Says when she walks or has her foot hanging down, it is painful for her toe.  She is unsure if ntg patch is helping.    afebrile  Vitals:   10/19/24 0039 10/19/24 0340  BP: (!) 97/59 105/62  Pulse: 63 (!) 56  Resp: 15 20  Temp: 98.4 F (36.9 C) 97.7 F (36.5 C)  SpO2: 93% 90%    Physical Exam: General:  no distress Cardiac:  regular Lungs:  non labored Extremities:  brisk left PT doppler signal; calf is soft and non tender   CBC    Component Value Date/Time   WBC 6.5 10/19/2024 0244   RBC 3.97 10/19/2024 0244   HGB 11.6 (L) 10/19/2024 0244   HGB 13.8 07/07/2022 1531   HCT 35.1 (L) 10/19/2024 0244   HCT 41.8 07/07/2022 1531   PLT 161 10/19/2024 0244   PLT 192 07/07/2022 1531   MCV 88.4 10/19/2024 0244   MCV 86 07/07/2022 1531   MCH 29.2 10/19/2024 0244   MCHC 33.0 10/19/2024 0244   RDW 15.6 (H) 10/19/2024 0244   RDW 14.2 07/07/2022 1531   LYMPHSABS 2.1 10/15/2024 1700   MONOABS 0.5 10/15/2024 1700   EOSABS 0.1 10/15/2024 1700   BASOSABS 0.0 10/15/2024 1700    BMET    Component Value Date/Time   NA 137 10/19/2024 0244   NA 142 07/07/2022 1531   K 3.7 10/19/2024 0244   CL 105 10/19/2024 0244   CO2 26 10/19/2024 0244   GLUCOSE 131 (H) 10/19/2024 0244   BUN 15 10/19/2024 0244   BUN 8 07/07/2022 1531   CREATININE 1.04 (H) 10/19/2024 0244   CALCIUM  8.3 (L) 10/19/2024 0244   GFRNONAA >60 10/19/2024 0244   GFRAA 53 (L) 06/09/2020 0331    INR    Component Value Date/Time   INR 0.93 07/10/2017 1106     Intake/Output Summary (Last 24 hours) at 10/19/2024 0644 Last data filed at 10/18/2024 2146 Gross per 24 hour  Intake 960 ml  Output --  Net 960 ml      Assessment/Plan:  63 y.o. female is s/p:  Angiogram LLE with balloon angioplasty superficial femoral artery, popliteal artery, tibioperoneal trunk and thrombolysis 12/9 and 12/10 2 Days  Post-Op   -pt with brisk doppler flow left PT.   -continue ntg patch to left great toe.   -hgb stable from yesterday and normal renal function. -DVT prophylaxis:  Eliquis -pt has f/u in one month with arterial studies  She has been started on Eliquis and Brilinta  was discontinued.  She will also need baby aspirin  daily.  -ok for discharge from vascular standpoint.     Lucie Apt, PA-C [facility] [phone] 10/19/2024 6:44 AM

## 2024-10-19 NOTE — Progress Notes (Signed)
°   10/19/24 1339  TOC Brief Assessment  Insurance and Status Reviewed  Patient has primary care physician Yes  Home environment has been reviewed home  Prior level of function: independent  Prior/Current Home Services No current home services  Social Drivers of Health Review SDOH reviewed no interventions necessary  Readmission risk has been reviewed Yes  Transition of care needs no transition of care needs at this time    Pt stable for transition home today, per PT/OT evals pt at baseline no follow up needs noted.

## 2024-10-19 NOTE — Evaluation (Signed)
 Occupational Therapy Evaluation Patient Details Name: Alice Bennett MRN: [id number] DOB: [birth year] Today's Date: 10/19/2024   History of Present Illness   Pt is a 63 y.o female admitted 12/8 for L toe discoloration. S/p LLE angiogram and placement of lysis catheter 12/9. S/p LLE angiography and balloon angioplasty 12/10.  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 PTA pt was independent with ADLs and IADLs. Today pt is at her baseline for ADLs and mobility. Pt completed LB dressing, UB dressing, standing grooming tasks, and ambulated 257ft ind. Pt reporting she feels she is at baseline for mobility and ADLs, no concerns for d/c. No follow up OT or DME needs. All education complete, no further acute OT needs, OT is signing off.    If plan is discharge home, recommend the following:   Assist for transportation     Functional Status Assessment   Patient has not had a recent decline in their functional status     Equipment Recommendations   None recommended by OT      Precautions/Restrictions   Precautions Precautions: Fall Recall of Precautions/Restrictions: Intact Restrictions Weight Bearing Restrictions Per Provider Order: No     Mobility Bed Mobility Overal bed mobility: Modified Independent       General bed mobility comments: HOB elevated    Transfers Overall transfer level: Independent Equipment used: None   General transfer comment: No AD, ambulated 226ft in hallway, ind      Balance Overall balance assessment: Mild deficits observed, not formally tested       ADL either performed or assessed with clinical judgement   ADL Overall ADL's : Independent       General ADL Comments: Standing ADLs, LB and UB dressing     Vision Baseline Vision/History: 0 No visual deficits Patient Visual Report: No change from  baseline Vision Assessment?: No apparent visual deficits            Pertinent Vitals/Pain Pain Assessment Pain Assessment: 0-10 Pain Score: 6  Pain Location: incision site Pain Descriptors / Indicators: Discomfort Pain Intervention(s): Monitored during session     Extremity/Trunk Assessment Upper Extremity Assessment Upper Extremity Assessment: Overall WFL for tasks assessed   Lower Extremity Assessment Lower Extremity Assessment: Overall WFL for tasks assessed   Cervical / Trunk Assessment Cervical / Trunk Assessment: Kyphotic   Communication Communication Communication: No apparent difficulties   Cognition Arousal: Alert Behavior During Therapy: WFL for tasks assessed/performed Cognition: No apparent impairments   Following commands: Intact       Cueing  General Comments   Cueing Techniques: Verbal cues  VSS on RA    Home Living Family/patient expects to be discharged to:: Private residence Living Arrangements: Children;Spouse/significant other Available Help at Discharge: Available 24 hours/day;Family Type of Home: House Home Access: Stairs to enter Secretary/administrator of Steps: 3 Entrance Stairs-Rails: None Home Layout: One level     Bathroom Shower/Tub: Chief Strategy Officer: Standard Bathroom Accessibility: No   Home Equipment: Agricultural Consultant (2 wheels);Shower seat   Additional Comments: Plan is to d/c to daughter's house      Prior Functioning/Environment Prior Level of Function : Independent/Modified Independent     Mobility Comments: Ind, no AD, denies falls ADLs Comments: Ind    OT Problem List: Pain        OT Goals(Current goals can be found in the  care plan section)   Acute Rehab OT Goals Patient Stated Goal: To go home OT Goal Formulation: All assessment and education complete, DC therapy Time For Goal Achievement: 11/02/24 Potential to Achieve Goals: Good   AM-PAC OT 6 Clicks Daily Activity     Outcome  Measure Help from another person eating meals?: None Help from another person taking care of personal grooming?: None Help from another person toileting, which includes using toliet, bedpan, or urinal?: None Help from another person bathing (including washing, rinsing, drying)?: None Help from another person to put on and taking off regular upper body clothing?: None Help from another person to put on and taking off regular lower body clothing?: None 6 Click Score: 24   End of Session Equipment Utilized During Treatment: Gait belt Nurse Communication: Mobility status  Activity Tolerance: Patient tolerated treatment well Patient left: in bed;with call bell/phone within reach  OT Visit Diagnosis: Pain Pain - Right/Left: Left Pain - part of body: Leg                Time: 9071-9053 OT Time Calculation (min): 18 min Charges:  OT General Charges $OT Visit: 1 Visit OT Evaluation $OT Eval Low Complexity: 1 Low  Adrianne BROCKS, OT  Acute Rehabilitation Services Office [phone] Secure chat preferred   Adrianne GORMAN [employer]vers 10/19/2024, 9:57 AM

## 2024-10-30 ENCOUNTER — Ambulatory Visit

## 2024-11-19 ENCOUNTER — Ambulatory Visit (HOSPITAL_COMMUNITY)

## 2024-11-19 ENCOUNTER — Encounter (HOSPITAL_COMMUNITY): Payer: Self-pay

## 2024-11-19 ENCOUNTER — Ambulatory Visit (HOSPITAL_COMMUNITY): Attending: Surgery

## 2024-12-04 ENCOUNTER — Telehealth: Payer: Self-pay

## 2024-12-04 ENCOUNTER — Other Ambulatory Visit: Payer: Self-pay | Admitting: Surgery

## 2024-12-04 DIAGNOSIS — R230 Cyanosis: Secondary | ICD-10-CM

## 2024-12-04 NOTE — Telephone Encounter (Signed)
 Patient called reporting blue toes on left foot.  Appts made for ABI, Lt LEA and PA

## 2024-12-05 ENCOUNTER — Encounter (HOSPITAL_COMMUNITY): Payer: Self-pay | Admitting: Internal Medicine

## 2024-12-05 ENCOUNTER — Ambulatory Visit (HOSPITAL_COMMUNITY)

## 2024-12-05 ENCOUNTER — Other Ambulatory Visit: Payer: Self-pay

## 2024-12-05 ENCOUNTER — Inpatient Hospital Stay (HOSPITAL_COMMUNITY)
Admission: EM | Admit: 2024-12-05 | Discharge: 2024-12-08 | DRG: 301 | Disposition: A | Attending: Internal Medicine | Admitting: Internal Medicine

## 2024-12-05 ENCOUNTER — Encounter

## 2024-12-05 ENCOUNTER — Emergency Department (HOSPITAL_COMMUNITY)

## 2024-12-05 DIAGNOSIS — K219 Gastro-esophageal reflux disease without esophagitis: Secondary | ICD-10-CM | POA: Diagnosis present

## 2024-12-05 DIAGNOSIS — F419 Anxiety disorder, unspecified: Secondary | ICD-10-CM | POA: Diagnosis present

## 2024-12-05 DIAGNOSIS — I152 Hypertension secondary to endocrine disorders: Secondary | ICD-10-CM | POA: Diagnosis not present

## 2024-12-05 DIAGNOSIS — Z955 Presence of coronary angioplasty implant and graft: Secondary | ICD-10-CM

## 2024-12-05 DIAGNOSIS — E785 Hyperlipidemia, unspecified: Secondary | ICD-10-CM | POA: Diagnosis present

## 2024-12-05 DIAGNOSIS — E66811 Obesity, class 1: Secondary | ICD-10-CM | POA: Diagnosis present

## 2024-12-05 DIAGNOSIS — Z885 Allergy status to narcotic agent status: Secondary | ICD-10-CM

## 2024-12-05 DIAGNOSIS — I70222 Atherosclerosis of native arteries of extremities with rest pain, left leg: Secondary | ICD-10-CM

## 2024-12-05 DIAGNOSIS — Z7901 Long term (current) use of anticoagulants: Secondary | ICD-10-CM

## 2024-12-05 DIAGNOSIS — F32A Depression, unspecified: Secondary | ICD-10-CM | POA: Diagnosis present

## 2024-12-05 DIAGNOSIS — E1159 Type 2 diabetes mellitus with other circulatory complications: Secondary | ICD-10-CM | POA: Diagnosis present

## 2024-12-05 DIAGNOSIS — Z95820 Peripheral vascular angioplasty status with implants and grafts: Secondary | ICD-10-CM

## 2024-12-05 DIAGNOSIS — Z7985 Long-term (current) use of injectable non-insulin antidiabetic drugs: Secondary | ICD-10-CM

## 2024-12-05 DIAGNOSIS — R230 Cyanosis: Secondary | ICD-10-CM

## 2024-12-05 DIAGNOSIS — I252 Old myocardial infarction: Secondary | ICD-10-CM

## 2024-12-05 DIAGNOSIS — E1151 Type 2 diabetes mellitus with diabetic peripheral angiopathy without gangrene: Principal | ICD-10-CM | POA: Diagnosis present

## 2024-12-05 DIAGNOSIS — Z79899 Other long term (current) drug therapy: Secondary | ICD-10-CM

## 2024-12-05 DIAGNOSIS — I70422 Atherosclerosis of autologous vein bypass graft(s) of the extremities with rest pain, left leg: Secondary | ICD-10-CM | POA: Insufficient documentation

## 2024-12-05 DIAGNOSIS — I739 Peripheral vascular disease, unspecified: Secondary | ICD-10-CM | POA: Diagnosis present

## 2024-12-05 DIAGNOSIS — F17210 Nicotine dependence, cigarettes, uncomplicated: Secondary | ICD-10-CM | POA: Diagnosis present

## 2024-12-05 DIAGNOSIS — Z6832 Body mass index (BMI) 32.0-32.9, adult: Secondary | ICD-10-CM

## 2024-12-05 DIAGNOSIS — I2510 Atherosclerotic heart disease of native coronary artery without angina pectoris: Secondary | ICD-10-CM | POA: Diagnosis present

## 2024-12-05 DIAGNOSIS — I70229 Atherosclerosis of native arteries of extremities with rest pain, unspecified extremity: Secondary | ICD-10-CM | POA: Diagnosis not present

## 2024-12-05 DIAGNOSIS — Z7984 Long term (current) use of oral hypoglycemic drugs: Secondary | ICD-10-CM

## 2024-12-05 DIAGNOSIS — Z7982 Long term (current) use of aspirin: Secondary | ICD-10-CM

## 2024-12-05 DIAGNOSIS — Z89421 Acquired absence of other right toe(s): Secondary | ICD-10-CM

## 2024-12-05 DIAGNOSIS — L89321 Pressure ulcer of left buttock, stage 1: Secondary | ICD-10-CM | POA: Diagnosis present

## 2024-12-05 DIAGNOSIS — I998 Other disorder of circulatory system: Principal | ICD-10-CM

## 2024-12-05 DIAGNOSIS — E1140 Type 2 diabetes mellitus with diabetic neuropathy, unspecified: Secondary | ICD-10-CM | POA: Diagnosis present

## 2024-12-05 DIAGNOSIS — Z720 Tobacco use: Secondary | ICD-10-CM | POA: Diagnosis present

## 2024-12-05 LAB — CBC WITH DIFFERENTIAL/PLATELET
Abs Immature Granulocytes: 0.04 10*3/uL (ref 0.00–0.07)
Basophils Absolute: 0 10*3/uL (ref 0.0–0.1)
Basophils Relative: 0 %
Eosinophils Absolute: 0.2 10*3/uL (ref 0.0–0.5)
Eosinophils Relative: 2 %
HCT: 43.8 % (ref 36.0–46.0)
Hemoglobin: 14.3 g/dL (ref 12.0–15.0)
Immature Granulocytes: 1 %
Lymphocytes Relative: 30 %
Lymphs Abs: 2.7 10*3/uL (ref 0.7–4.0)
MCH: 29.1 pg (ref 26.0–34.0)
MCHC: 32.6 g/dL (ref 30.0–36.0)
MCV: 89 fL (ref 80.0–100.0)
Monocytes Absolute: 0.5 10*3/uL (ref 0.1–1.0)
Monocytes Relative: 6 %
Neutro Abs: 5.4 10*3/uL (ref 1.7–7.7)
Neutrophils Relative %: 61 %
Platelets: 186 10*3/uL (ref 150–400)
RBC: 4.92 MIL/uL (ref 3.87–5.11)
RDW: 15.4 % (ref 11.5–15.5)
WBC: 8.8 10*3/uL (ref 4.0–10.5)
nRBC: 0 % (ref 0.0–0.2)

## 2024-12-05 LAB — BASIC METABOLIC PANEL WITH GFR
Anion gap: 11 (ref 5–15)
BUN: 13 mg/dL (ref 8–23)
CO2: 27 mmol/L (ref 22–32)
Calcium: 9.2 mg/dL (ref 8.9–10.3)
Chloride: 102 mmol/L (ref 98–111)
Creatinine, Ser: 0.97 mg/dL (ref 0.44–1.00)
GFR, Estimated: >60 mL/min
Glucose, Bld: 138 mg/dL — ABNORMAL HIGH (ref 70–99)
Potassium: 5 mmol/L (ref 3.5–5.1)
Sodium: 140 mmol/L (ref 135–145)

## 2024-12-05 MED ORDER — CARVEDILOL 6.25 MG PO TABS
6.2500 mg | ORAL_TABLET | Freq: Two times a day (BID) | ORAL | Status: DC
  Filled 2024-12-05: qty 1

## 2024-12-05 MED ORDER — PANTOPRAZOLE SODIUM 40 MG PO TBEC
40.0000 mg | DELAYED_RELEASE_TABLET | Freq: Every day | ORAL | Status: DC
  Administered 2024-12-06 – 2024-12-08 (×3): 40 mg via ORAL
  Filled 2024-12-05 (×3): qty 1

## 2024-12-05 MED ORDER — HEPARIN (PORCINE) 25000 UT/250ML-% IV SOLN
1550.0000 [IU]/h | INTRAVENOUS | Status: DC
  Administered 2024-12-06: 1550 [IU]/h via INTRAVENOUS
  Administered 2024-12-06: 1400 [IU]/h via INTRAVENOUS
  Administered 2024-12-07: 1550 [IU]/h via INTRAVENOUS
  Filled 2024-12-05 (×3): qty 250

## 2024-12-05 MED ORDER — INSULIN ASPART 100 UNIT/ML IJ SOLN
0.0000 [IU] | Freq: Three times a day (TID) | INTRAMUSCULAR | Status: DC
  Administered 2024-12-06 (×3): 2 [IU] via SUBCUTANEOUS
  Administered 2024-12-07: 1 [IU] via SUBCUTANEOUS
  Administered 2024-12-08: 2 [IU] via SUBCUTANEOUS
  Filled 2024-12-05: qty 1
  Filled 2024-12-05 (×4): qty 2

## 2024-12-05 MED ORDER — PREGABALIN 75 MG PO CAPS
150.0000 mg | ORAL_CAPSULE | Freq: Every morning | ORAL | Status: DC
  Administered 2024-12-06 – 2024-12-08 (×3): 150 mg via ORAL
  Filled 2024-12-05 (×3): qty 2

## 2024-12-05 MED ORDER — IOHEXOL 350 MG/ML SOLN
100.0000 mL | Freq: Once | INTRAVENOUS | Status: AC | PRN
  Administered 2024-12-05: 100 mL via INTRAVENOUS

## 2024-12-05 MED ORDER — ASPIRIN 81 MG PO TBEC
81.0000 mg | DELAYED_RELEASE_TABLET | Freq: Every day | ORAL | Status: DC
  Administered 2024-12-06 – 2024-12-08 (×3): 81 mg via ORAL
  Filled 2024-12-05 (×3): qty 1

## 2024-12-05 MED ORDER — ATORVASTATIN CALCIUM 80 MG PO TABS
80.0000 mg | ORAL_TABLET | Freq: Every day | ORAL | Status: DC
  Administered 2024-12-06 – 2024-12-07 (×2): 80 mg via ORAL
  Filled 2024-12-05 (×2): qty 1

## 2024-12-05 MED ORDER — ACETAMINOPHEN 325 MG PO TABS
650.0000 mg | ORAL_TABLET | Freq: Four times a day (QID) | ORAL | Status: DC | PRN
  Filled 2024-12-05: qty 2

## 2024-12-05 MED ORDER — PREGABALIN 75 MG PO CAPS: 150.0000 mg | ORAL_CAPSULE | ORAL | Status: DC

## 2024-12-05 MED ORDER — PREGABALIN 100 MG PO CAPS
300.0000 mg | ORAL_CAPSULE | Freq: Every day | ORAL | Status: DC
  Administered 2024-12-06 – 2024-12-07 (×3): 300 mg via ORAL
  Filled 2024-12-05 (×3): qty 3

## 2024-12-05 MED ORDER — EZETIMIBE 10 MG PO TABS
10.0000 mg | ORAL_TABLET | Freq: Every day | ORAL | Status: DC
  Administered 2024-12-06 – 2024-12-08 (×3): 10 mg via ORAL
  Filled 2024-12-05 (×3): qty 1

## 2024-12-05 MED ORDER — ACETAMINOPHEN 650 MG RE SUPP: 650.0000 mg | Freq: Four times a day (QID) | RECTAL | Status: DC | PRN

## 2024-12-05 MED ORDER — CLONAZEPAM 0.5 MG PO TABS
2.0000 mg | ORAL_TABLET | Freq: Every day | ORAL | Status: DC
  Administered 2024-12-06 – 2024-12-07 (×3): 2 mg via ORAL
  Filled 2024-12-05 (×3): qty 4

## 2024-12-05 MED ORDER — SERTRALINE HCL 100 MG PO TABS
100.0000 mg | ORAL_TABLET | Freq: Every day | ORAL | Status: DC
  Administered 2024-12-06 – 2024-12-08 (×3): 100 mg via ORAL
  Filled 2024-12-05 (×3): qty 1

## 2024-12-05 NOTE — H&P (Incomplete)
 " History and Physical    DWAYNE BEGAY FMW:[id number] DOB: 10-31-1961 DOA: 12/05/2024  Patient coming from: Home.  Chief Complaint: Left great toe discoloration.  HPI: Alice Bennett is a 64 y.o. female with medical history significant for CAD status post DES to LAD in 2018, PAD status post left right femoral-femoral bypass in 2024, right fourth toe amputation, diabetes mellitus type 2, hypertension, hyperlipidemia, anxiety, tobacco use who was recently admitted in hospital on October 15, 2024 with critical limb ischemia requiring thrombectomy and catheter directed thrombolysis presents to the ER with discoloration of the left great toe with rest pain for the last 2 days.  Denies any trauma.  Has been compliant with her Eliquis  antiplatelet agents and statins.  ED Course: In the ER patient was evaluated by Dr. Vonzell vascular surgeon and will be admitted for repeat angiogram to evaluate for possible embolic source or failure of intervention.  Labs are largely unremarkable.  Review of Systems: As per HPI, rest all negative.   Past Medical History:  Diagnosis Date   CAD (coronary artery disease)    07/10/17 PCI/DES to LAD, EF 35-40%   Diabetes mellitus without complication (HCC)    Hyperlipidemia    Hypertension    NSTEMI (non-ST elevated myocardial infarction) [facility])     Past Surgical History:  Procedure Laterality Date   ABDOMINAL AORTOGRAM W/LOWER EXTREMITY N/A 07/26/2022   Procedure: ABDOMINAL AORTOGRAM W/LOWER EXTREMITY;  Surgeon: Court Dorn PARAS, MD;  Location: [facility];  Service: Cardiovascular;  Laterality: N/A;   ABDOMINAL AORTOGRAM W/LOWER EXTREMITY N/A 04/22/2023   Procedure: ABDOMINAL AORTOGRAM W/LOWER EXTREMITY;  Surgeon: Magda Debby SAILOR, MD;  Location: [facility];  Service: Cardiovascular;  Laterality: N/A;   AMPUTATION TOE Right 10/27/2023   Procedure: AMPUTATION TOE;  Surgeon: Malvin Marsa JULIANNA, DPM;  Location: [facility];  Service:  Orthopedics/Podiatry;  Laterality: Right;  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   AORTOGRAM Bilateral 02/05/2023   Procedure: AORTOGRAM WITH RUN OFF;  Surgeon: Serene Gaile ORN, MD;  Location: [facility];  Service: Vascular;  Laterality: Bilateral;   APPLICATION OF WOUND VAC Bilateral 05/04/2023   Procedure: APPLICATION OF WOUND VAC;  Surgeon: Serene Gaile ORN, MD;  Location: [facility];  Service: Vascular;  Laterality: Bilateral;   CORONARY PRESSURE/FFR STUDY N/A 07/10/2017   Procedure: INTRAVASCULAR PRESSURE WIRE/FFR STUDY;  Surgeon: Jordan, Peter M, MD;  Location: [facility];  Service: Cardiovascular;  Laterality: N/A;   CORONARY PRESSURE/FFR STUDY N/A 05/31/2022   Procedure: INTRAVASCULAR PRESSURE WIRE/FFR STUDY;  Surgeon: Mady Bruckner, MD;  [facility]: [facility];  Service: Cardiovascular;  Laterality: N/A;   CORONARY STENT INTERVENTION N/A 07/10/2017   Procedure: CORONARY STENT INTERVENTION;  Surgeon: Jordan, Peter M, MD;  Location: [facility] INVASIVE CV LAB;  Service: Cardiovascular;  Laterality: N/A;   CORONARY STENT INTERVENTION N/A 05/31/2022   Procedure: CORONARY STENT INTERVENTION;  Surgeon: Mady Bruckner, MD;  [facility];  Service: Cardiovascular;  Laterality: N/A;   FEMORAL-FEMORAL BYPASS GRAFT Bilateral 05/04/2023   Procedure: LEFT TO RIGHT FEMORAL-FEMORAL ARTERY BYPASS;  Surgeon: Serene Gaile ORN, MD;  [facility]: [facility];  Service: Vascular;  Laterality: Bilateral;   INSERTION OF ILIAC STENT Right 02/05/2023   Procedure: INSERTION OF RIGHT ILIAC STENT;  Surgeon: Serene Gaile ORN, MD;  [facility];  Service: Vascular;  Laterality: Right;   LEFT HEART CATH AND CORONARY ANGIOGRAPHY N/A 07/10/2017   Procedure: LEFT HEART CATH AND CORONARY ANGIOGRAPHY;  Surgeon: Jordan,  Maude HERO, MD;  [facility]: [facility];  Service: Cardiovascular;  Laterality: N/A;   LOWER EXTREMITY ANGIOGRAM N/A 02/05/2023   Procedure: RIGHT LOWER EXTREMITY ANGIOGRAM;  Surgeon: Serene Gaile ORN, MD;   [facility];  Service: Vascular;  Laterality: N/A;   LOWER EXTREMITY ANGIOGRAPHY N/A 10/16/2024   Procedure: Lower Extremity Angiography;  Surgeon: Serene Gaile ORN, MD;  [facility];  Service: Cardiovascular;  Laterality: N/A;   LOWER EXTREMITY INTERVENTION Right 10/17/2024   Procedure: LOWER EXTREMITY INTERVENTION;  Surgeon: Lanis Fonda BRAVO, MD;  Location: [facility] INVASIVE CV LAB;  Service: Cardiovascular;  Laterality: Right;   PERIPHERAL VASCULAR INTERVENTION  07/26/2022   Procedure: PERIPHERAL VASCULAR INTERVENTION;  Surgeon: Court Dorn PARAS, MD;  [facility];  Service: Cardiovascular;;   PERIPHERAL VASCULAR INTERVENTION  04/22/2023   Procedure: PERIPHERAL VASCULAR INTERVENTION;  Surgeon: Magda Debby SAILOR, MD;  [facility];  Service: Cardiovascular;;  right common and ecternal iliac, right common femoral   PERIPHERAL VASCULAR THROMBECTOMY  10/16/2024   Procedure: PERIPHERAL VASCULAR THROMBECTOMY;  Surgeon: Serene Gaile ORN, MD;  [facility];  Service: Cardiovascular;;   PERIPHERAL VASCULAR THROMBECTOMY N/A 10/17/2024   Procedure: PERIPHERAL VASCULAR THROMBECTOMY;  Surgeon: Lanis Fonda BRAVO, MD;  Location: [facility] INVASIVE CV LAB;  Service: Cardiovascular;  Laterality: N/A;   RIGHT/LEFT HEART CATH AND CORONARY ANGIOGRAPHY N/A 05/31/2022   Procedure: RIGHT/LEFT HEART CATH AND CORONARY ANGIOGRAPHY;  Surgeon: Mady Bruckner, MD;  [facility];  Service: Cardiovascular;  Laterality: N/A;   ULTRASOUND GUIDANCE FOR VASCULAR ACCESS Bilateral 02/05/2023   Procedure: ULTRASOUND GUIDANCE FOR VASCULAR ACCESS;  Surgeon: Serene Gaile ORN, MD;  [facility]: [facility];  Service: Vascular;  Laterality: Bilateral;     reports that she quit smoking about 19 months ago. Her smoking use included cigarettes. She has a 5 pack-year smoking history. She has been exposed to tobacco smoke. She has never used smokeless tobacco. She reports that she  does not drink alcohol and does not use drugs.  Allergies[1]  History reviewed. No pertinent family history.  Prior to Admission medications  Medication Sig Start Date End Date Taking? Authorizing Provider  albuterol  (VENTOLIN  HFA) 108 (90 Base) MCG/ACT inhaler Inhale 1 puff into the lungs every 6 (six) hours as needed for shortness of breath. 10/31/24  Yes [provider]  apixaban  (ELIQUIS ) 5 MG TABS tablet Take 1 tablet (5 mg total) by mouth 2 (two) times daily. 10/19/24  Yes Tobie Gaines, DO  aspirin  EC 81 MG tablet Take 1 tablet (81 mg total) by mouth daily. 10/19/24  Yes Tobie Gaines, DO  atorvastatin  (LIPITOR ) 80 MG tablet Take 1 tablet (80 mg total) by mouth daily at 6 PM. 07/13/24  Yes Court Dorn PARAS, MD  carvedilol  (COREG ) 3.125 MG tablet Take 2 tablets (6.25 mg total) by mouth 2 (two) times daily with a meal. 10/19/24  Yes Tobie Gaines, DO  Cholecalciferol  (D3 HIGH POTENCY) 125 MCG (5000 UT) capsule Take 5,000 Units by mouth daily.   Yes [provider]  clonazePAM  (KLONOPIN ) 1 MG tablet Take 2 mg by mouth at bedtime.   Yes [provider]  ezetimibe  (ZETIA ) 10 MG tablet Take 1 tablet (10 mg total) by mouth daily. 07/13/24  Yes Court Dorn PARAS, MD  FARXIGA  10 MG TABS tablet Take 10 mg by mouth daily.  03/28/19  Yes [provider]  fluticasone (FLONASE) 50 MCG/ACT nasal spray Place 1 spray into both  nostrils 2 (two) times daily as needed for allergies or rhinitis.   Yes [provider]  nitroGLYCERIN  (NITROSTAT ) 0.4 MG SL tablet Place 0.4 mg under the tongue every 5 (five) minutes as needed for chest pain.   Yes [provider]  [Paused] olmesartan  (BENICAR ) 20 MG tablet Take 1 tablet (20 mg total) by mouth daily. Wait to take this until your doctor or other care provider tells you to start again. 03/09/21  Yes Burnard Debby LABOR, MD  OZEMPIC, 2 MG/DOSE, 8 MG/3ML SOPN Inject 2 mg into the skin once a week. 02/01/23  Yes [provider]  pantoprazole  (PROTONIX ) 40 MG tablet Take 1 tablet (40 mg total) by mouth daily. Patient taking differently: Take 40 mg by mouth daily before breakfast. 07/19/17  Yes Burnard Debby LABOR, MD  pregabalin  (LYRICA ) 150 MG capsule Take 150-300 mg by mouth See admin instructions. Take one tablet by mouth in the morning and then take two tablet by mouth in the evening because of severe toe pain per patient   Yes [provider]  sertraline  (ZOLOFT ) 100 MG tablet Take 100 mg by mouth daily.   Yes [provider]  nitroGLYCERIN  (NITRODUR - DOSED IN MG/24 HR) 0.2 mg/hr patch Place 1 patch (0.2 mg total) onto the skin daily. Patient not taking: Reported on 12/05/2024 10/19/24 10/19/25  Tobie Gaines, DO    Physical Exam: Constitutional: Moderately built and nourished. Vitals:   12/05/24 1310 12/05/24 1845 12/05/24 1900 12/05/24 2127  BP:  (!) 125/32 (!) 135/99 (!) 158/68  Pulse:  73 65 64  Resp:  17 17 18   Temp:   98.4 F (36.9 C) 97.6 F (36.4 C)  TempSrc:   Axillary Oral  SpO2:  100% 100% 98%  Weight: 99.8 kg     Height: 5' 9 (1.753 m)      Eyes: Anicteric no pallor. ENMT: No discharge from the ears eyes nose or mouth. Neck: No mass felt.  No neck rigidity. Respiratory: No rhonchi or crepitations. Cardiovascular: S1-S2 heard. Abdomen: Soft nontender bowel sound present. Musculoskeletal: Left great toe looks discolored.  Extremities warm to touch. Skin: Left great toe is discolored. Neurologic: Alert awake oriented to time place and person.  Moves all extremities. Psychiatric: Appears normal.  Normal affect.   Labs on Admission: I have personally reviewed following labs and imaging studies  CBC: Recent Labs  Lab 12/05/24 1416  WBC 8.8  NEUTROABS 5.4  HGB 14.3  HCT 43.8  MCV 89.0  PLT 186   Basic Metabolic Panel: Recent Labs  Lab 12/05/24 1416  NA 140  K 5.0  CL 102  CO2 27  GLUCOSE 138*  BUN 13  CREATININE 0.97  CALCIUM  9.2    GFR: Estimated Creatinine Clearance: 74.6 mL/min (by C-G formula based on SCr of 0.97 mg/dL). Liver Function Tests: No results for input(s): AST, ALT, ALKPHOS, BILITOT, PROT, ALBUMIN in the last 168 hours. No results for input(s): LIPASE, AMYLASE in the last 168 hours. No results for input(s): AMMONIA in the last 168 hours. Coagulation Profile: No results for input(s): INR, PROTIME in the last 168 hours. Cardiac Enzymes: No results for input(s): CKTOTAL, CKMB, CKMBINDEX, TROPONINI in the last 168 hours. BNP (last 3 results) No results for input(s): PROBNP in the last 8760 hours. HbA1C: No results for input(s): HGBA1C in the last 72 hours. CBG: No results for input(s): GLUCAP in the last 168 hours. Lipid Profile: No results for input(s): CHOL, HDL, LDLCALC, TRIG, CHOLHDL, LDLDIRECT  in the last 72 hours. Thyroid Function Tests: No results for input(s): TSH, T4TOTAL, FREET4, T3FREE, THYROIDAB in the last 72 hours. Anemia Panel: No results for input(s): VITAMINB12, FOLATE, FERRITIN, TIBC, IRON, RETICCTPCT in the last 72 hours. Urine analysis:    Component Value Date/Time   COLORURINE YELLOW 05/02/2023 1708   APPEARANCEUR HAZY (A) 05/02/2023 1708   LABSPEC 1.015 05/02/2023 1708   PHURINE 5.0 05/02/2023 1708   GLUCOSEU NEGATIVE 05/02/2023 1708   HGBUR SMALL (A) 05/02/2023 1708   BILIRUBINUR NEGATIVE 05/02/2023 1708   KETONESUR NEGATIVE 05/02/2023 1708   PROTEINUR 30 (A) 05/02/2023 1708   NITRITE NEGATIVE 05/02/2023 1708   LEUKOCYTESUR MODERATE (A) 05/02/2023 1708   Sepsis Labs: @LABRCNTIP (procalcitonin:4,lacticidven:4) )No results found for this or any previous visit (from the past 240 hours).   Radiological Exams on Admission: CT ANGIO LOWER EXT BILAT W &/OR WO CONTRAST Result Date: 12/05/2024 EXAM: CTA BILATERAL LOWER EXTREMITY 12/05/2024 07:45:00 PM TECHNIQUE: Contrast-enhanced computed tomography  angiography of the lower extremity was performed with multiplanar reconstructions. Maximum intensity projection images were created on a separate workstation and reviewed. Automated exposure control, iterative reconstruction, and/or weight based adjustment of the mA/kV was utilized to reduce the radiation dose to as low as reasonably achievable. 100 mL of iohexol  (OMNIPAQUE ) 350 MG/ML injection was administered. COMPARISON: 10/15/2024 CLINICAL HISTORY: Left lower extremity toe is purple, history of arterial occlusion, no pulses. Abnormal pulse exam with no pulses in the left foot, left foot digital cyanosis. FINDINGS: ARTERIAL: LEFT COMMON ILIAC ARTERY: Widely patent. LEFT EXTERNAL ILIAC ARTERY: Patent. COMMON FEMORAL ARTERY: Left: No hemodynamically significant stenosis (less than 50% stenosis noted just proximal to the bypass graft, but not reported as per instructions). Right: The right common femoral artery is patent. RIGHT COMMON ILIAC ARTERY: Stented and chronically occluded. RIGHT INTERNAL ILIAC ARTERY: Proximally occluded and reconstituted via iliolumbar collaterals. RIGHT EXTERNAL ILIAC ARTERY: Patent. FEMOROFEMORAL BYPASS GRAFT: Femorofemoral bypass grafting is again identified and is widely patent. SUPERFICIAL FEMORAL ARTERY: Left: Interval recanalization. Now demonstrates patency with a focal 50% stenosis within the adductor canal (202/4). Right: Patent but demonstrates a focal 50% stenosis within the adductor canal (202/4). PROFUNDA FEMORAL ARTERY: Left: Abrupt occlusion just distal to its second muscular perforating branch, unchanged from prior examination. Right: Abrupt occlusion just distal to its second muscular perforating branch, unchanged from prior examination. POPLITEAL ARTERY: Left: Widely patent. Right: Wide patency. TIBIOPERONEAL TRUNK: Left: The tibioperoneal trunk is patent. Right: The tibioperoneal trunk is patent. ANTERIOR TIBIAL ARTERY: Left: Occluded shortly after its takeoff. Right:  Occluded shortly after its takeoff. PERONEAL ARTERY: Left: Does not clearly reconstitute the dorsalis pedis artery though this may be related to relatively delayed enhancement of the left foot in relation to the right. Right: Reconstitutes the dorsalis pedis artery and lateral calcaneal artery. POSTERIOR TIBIAL ARTERY: Left: Patent and forms the plantar arch. Right: Forms the plantar arch. BONES AND SOFT TISSUES: No significant osseous or soft tissue abnormalities seen within the field of view. IMPRESSION: 1. Left lower extremity arterial inflow is widely patent, with a widely patent femorofemoral bypass graft. 2. Left lower extremity arterial outflow demonstrates interval recanalization with patency of the superficial femoral artery, focal 50% stenosis within the adductor canal, and chronic abrupt occlusion of the profunda femoral artery just distal to its second muscular perforating branch. 3. Left lower extremity arterial runoff demonstrates occlusion of the anterior tibial artery shortly after its takeoff with 2-vessel runoff to the ankle via the peroneal and posterior tibial arteries, without clear  reconstitution of the dorsalis pedis artery from the peroneal artery, which may be related to relatively delayed enhancement. The plantar arch is patent. 4. Right lower extremity arterial inflow is stented and chronically occluded, with proximal occlusion of the right internal iliac artery and reconstitution via iliolumbar collaterals. 5. Right lower extremity arterial outflow is patent with focal 50% stenosis within the adductor canal and a widely patent popliteal artery. 6. Right lower extremity arterial runoff demonstrates occlusion of the anterior tibial artery shortly after its takeoff with 2-vessel runoff to the ankle via the peroneal and posterior tibial arteries, with reconstitution of the dorsalis pedis artery and plantar arch . Electronically signed by: Dorethia Molt MD 12/05/2024 08:01 PM EST RP  Workstation: [id number]   DG Toe Great Left Result Date: 12/05/2024 CLINICAL DATA:  Pain and discoloration of the LEFT great toe EXAM: LEFT GREAT TOE COMPARISON:  LEFT foot radiographs 10/15/2024 FINDINGS: Mild diffuse soft tissue swelling of the great toe without underlying fracture or dislocation. No radiopaque foreign bodies. Mild hallux valgus deformity of the great toe. Mild degenerative changes of the first metatarsophalangeal joint. IMPRESSION: Mild diffuse soft tissue swelling of the LEFT great toe, without underlying fracture or dislocation. Electronically Signed   By: Aliene Lloyd M.D.   On: 12/05/2024 13:47     Assessment/Plan Principal Problem:   Critical lower limb ischemia (HCC) Active Problems:   Hypertension associated with diabetes (HCC)   Tobacco use   Peripheral arterial disease   CAD (coronary artery disease)   Type 2 diabetes mellitus with diabetic peripheral angiopathy without gangrene, without long-term current use of insulin  (HCC)    Left great toe discoloration and rest pain after recent intervention for critical limb ischemia requiring thrombectomy and catheter directed thrombolysis.  Appreciate Dr. Emi vascular surgery consult.  Plan is to hold Eliquis  and wait for washout and heparin  bridging until then.  Continue statins aspirin . Diabetes mellitus type 2 on SGLT2 inhibitor and GLP-1 agonist.  Last hemoglobin A1c was 7 about a month ago. Hypertension on carvedilol . Hyperlipidemia on statins and Zetia . GERD on PPI. Neuropathy on Lyrica . Depression and anxiety on Zoloft  and Klonopin .  Tobacco abuse advised about quitting. CAD denies any chest pain.  Continue statins beta-blockers and antiplatelet agents.  Klonopin  and Lyrica  dose confirmed on PDMP website.  Since patient has had left great toe cyanosis concerning for ischemia will need Eliquis  washout before procedure could be done and will need more than 2 midnight stay.  DVT prophylaxis: Heparin   infusion. Code Status: Full code. Family Communication: Patient's daughter at the bedside. Disposition Plan: Medical floor. Consults called: Vascular surgery. Admission status: Inpatient.         [1]  Allergies Allergen Reactions   Codeine Itching   "

## 2024-12-05 NOTE — Progress Notes (Addendum)
 PHARMACY - ANTICOAGULATION CONSULT NOTE  Pharmacy Consult for heparin  Indication: VTE  Allergies[1]  Patient Measurements: Height: 5' 9 (175.3 cm) Weight: 99.8 kg (220 lb) IBW/kg (Calculated) : 66.2 HEPARIN  DW (KG): 87.9  Vital Signs: Temp: 97.6 F (36.4 C) (01/28 2127) Temp Source: Oral (01/28 2127) BP: 158/68 (01/28 2127) Pulse Rate: 64 (01/28 2127)  Labs: Recent Labs    12/05/24 1416  HGB 14.3  HCT 43.8  PLT 186  CREATININE 0.97    Estimated Creatinine Clearance: 74.6 mL/min (by C-G formula based on SCr of 0.97 mg/dL).   Medical History: Past Medical History:  Diagnosis Date   CAD (coronary artery disease)    07/10/17 PCI/DES to LAD, EF 35-40%   Diabetes mellitus without complication (HCC)    Hyperlipidemia    Hypertension    NSTEMI (non-ST elevated myocardial infarction) [facility])    Assessment: 61 yoF presented with recurrent left great toe cyanosis and rest pain. PMH includes recent endovascular intervention s/p catheter directed thrombolysis and angioplasty d/c'd on eliquis  (10/2024). Pharmacy consulted to dose heparin  for VTE  -CBC WNL -Last dose of eliquis : 1/27 2100  Goal of Therapy:  Heparin  level 0.3-0.7 units/ml aPTT 66-102 seconds Monitor platelets by anticoagulation protocol: Yes   Plan:  -No bolus given recent DOAC use -Start heparin  at 1400 units/hr -8h aPTT/heparin  level -Monitor with aPTTs until correlates with heparin  level CBC daily  Lynwood Poplar, PharmD, BCPS Clinical Pharmacist 12/05/2024 11:35 PM        [1]  Allergies Allergen Reactions   Codeine Itching

## 2024-12-05 NOTE — ED Provider Notes (Signed)
 " [place] EMERGENCY DEPARTMENT AT [facility] Provider Note   CSN: [id number] Arrival date & time: 12/05/24  1207     Patient presents with: Toe Pain   Alice Bennett is a 64 y.o. female.  {Add pertinent medical, surgical, social history, OB history to HPI:148} 64 year old female presents for evaluation of left great toe pain.  She was recently here for critical limb ischemia and had catheter directed thrombolysis on 9 December.  She states toe did go back to normal color and states has been compliant with Eliquis .  She states about a week ago she noticed the toe became purple and started hurting again.  She was post to follow-up with vascular surgery never did.  Was post go to an appointment yesterday for an ultrasound but did not due to the roads being frozen.  She denies any other symptoms or concerns.   Toe Pain Pertinent negatives include no chest pain, no abdominal pain and no shortness of breath.       Prior to Admission medications  Medication Sig Start Date End Date Taking? Authorizing Provider  apixaban  (ELIQUIS ) 5 MG TABS tablet Take 1 tablet (5 mg total) by mouth 2 (two) times daily. 10/19/24   Tobie Gaines, DO  aspirin  EC 81 MG tablet Take 1 tablet (81 mg total) by mouth daily. 10/19/24   Tobie Gaines, DO  atorvastatin  (LIPITOR ) 80 MG tablet Take 1 tablet (80 mg total) by mouth daily at 6 PM. 07/13/24   Court Dorn PARAS, MD  carvedilol  (COREG ) 3.125 MG tablet Take 2 tablets (6.25 mg total) by mouth 2 (two) times daily with a meal. 10/19/24   Tobie Gaines, DO  Cholecalciferol  (D3 HIGH POTENCY) 125 MCG (5000 UT) capsule Take 5,000 Units by mouth daily.    [provider]  clonazePAM  (KLONOPIN ) 1 MG tablet Take 2 mg by mouth at bedtime.    [provider]  ezetimibe  (ZETIA ) 10 MG tablet Take 1 tablet (10 mg total) by mouth daily. 07/13/24   Court Dorn PARAS, MD  FARXIGA  10 MG TABS tablet Take 10 mg by mouth daily.  03/28/19   [provider]  fluticasone (FLONASE) 50 MCG/ACT nasal spray Place 1 spray into both nostrils 2 (two) times daily as needed for allergies or rhinitis.    [provider]  nitroGLYCERIN  (NITRODUR - DOSED IN MG/24 HR) 0.2 mg/hr patch Place 1 patch (0.2 mg total) onto the skin daily. 10/19/24 10/19/25  Tobie Gaines, DO  nitroGLYCERIN  (NITROSTAT ) 0.4 MG SL tablet Place 0.4 mg under the tongue every 5 (five) minutes as needed for chest pain.    [provider]  [Paused] olmesartan  (BENICAR ) 20 MG tablet Take 1 tablet (20 mg total) by mouth daily. Wait to take this until your doctor or other care provider tells you to start again. 03/09/21   Burnard Debby LABOR, MD  OZEMPIC, 2 MG/DOSE, 8 MG/3ML SOPN Inject 2 mg into the skin every Sunday. 02/01/23   [provider]  pantoprazole  (PROTONIX ) 40 MG tablet Take 1 tablet (40 mg total) by mouth daily. Patient taking differently: Take 40 mg by mouth daily before breakfast. 07/19/17   Burnard Debby LABOR, MD  pregabalin  (LYRICA ) 150 MG capsule Take 450 mg by mouth at bedtime.    [provider]  sertraline  (ZOLOFT ) 100 MG tablet Take 100 mg by mouth daily.    [provider]    Allergies: Codeine    Review of Systems  Constitutional:  Negative  for chills and fever.  HENT:  Negative for ear pain and sore throat.   Eyes:  Negative for pain and visual disturbance.  Respiratory:  Negative for cough and shortness of breath.   Cardiovascular:  Negative for chest pain and palpitations.  Gastrointestinal:  Negative for abdominal pain and vomiting.  Genitourinary:  Negative for dysuria and hematuria.  Musculoskeletal:  Negative for arthralgias and back pain.       Admits left toe pain  Skin:  Negative for color change and rash.  Neurological:  Negative for seizures and syncope.  All other systems reviewed and are negative.   Updated Vital Signs BP (!) 155/57   Pulse 79   Temp 97.8 F (36.6 C) (Oral)   Resp 15   Ht 5' 9 (1.753 m)    Wt 99.8 kg   SpO2 94%   BMI 32.49 kg/m   Physical Exam Vitals and nursing note reviewed.  Constitutional:      General: She is not in acute distress.    Appearance: Normal appearance. She is well-developed. She is not ill-appearing.  HENT:     Head: Normocephalic and atraumatic.  Eyes:     Conjunctiva/sclera: Conjunctivae normal.  Cardiovascular:     Rate and Rhythm: Normal rate and regular rhythm.     Heart sounds: No murmur heard. Pulmonary:     Effort: Pulmonary effort is normal. No respiratory distress.     Breath sounds: Normal breath sounds.  Abdominal:     Palpations: Abdomen is soft.     Tenderness: There is no abdominal tenderness.  Musculoskeletal:        General: No swelling.     Cervical back: Neck supple.     Comments: Left great toe is purple and exquisitely tender to touch, and extremity is cool, there are no palpable or pulses auscultated on Doppler in the foot  Skin:    General: Skin is warm and dry.     Capillary Refill: Capillary refill takes less than 2 seconds.  Neurological:     General: No focal deficit present.     Mental Status: She is alert.  Psychiatric:        Mood and Affect: Mood normal.     (all labs ordered are listed, but only abnormal results are displayed) Labs Reviewed  BASIC METABOLIC PANEL WITH GFR - Abnormal; Notable for the following components:      Result Value   Glucose, Bld 138 (*)    All other components within normal limits  CBC WITH DIFFERENTIAL/PLATELET    EKG: None  Radiology: DG Toe Great Left Result Date: 12/05/2024 CLINICAL DATA:  Pain and discoloration of the LEFT great toe EXAM: LEFT GREAT TOE COMPARISON:  LEFT foot radiographs 10/15/2024 FINDINGS: Mild diffuse soft tissue swelling of the great toe without underlying fracture or dislocation. No radiopaque foreign bodies. Mild hallux valgus deformity of the great toe. Mild degenerative changes of the first metatarsophalangeal joint. IMPRESSION: Mild diffuse soft  tissue swelling of the LEFT great toe, without underlying fracture or dislocation. Electronically Signed   By: Aliene Lloyd M.D.   On: 12/05/2024 13:47    {Document cardiac monitor, telemetry assessment procedure when appropriate:32947} Procedures   Medications Ordered in the ED - No data to display    {Click here for ABCD2, HEART and other calculators REFRESH Note before signing:1}  Medical Decision Making Amount and/or Complexity of Data Reviewed Radiology: ordered.   ***  {Document critical care time when appropriate  Document review of labs and clinical decision tools ie CHADS2VASC2, etc  Document your independent review of radiology images and any outside records  Document your discussion with family members, caretakers and with consultants  Document social determinants of health affecting pt's care  Document your decision making why or why not admission, treatments were needed:32947:::1}   Final diagnoses:  None    ED Discharge Orders     None        "

## 2024-12-05 NOTE — Consult Note (Signed)
 [facility] [place]  ASSESSMENT / PLAN: 64 y.o. female with recurrent left great toe cyanosis and rest pain after recent endovascular intervention for the same 12/10-12/11/25. Her physical exam is reassuring and does not show evidence of acute limb ischemia.   Plan repeat angiogram to evaluate for possible embolic source or failure of intervention.   Recommend medicine consult for consideration of admission. Will need Eliquis  washout prior to intervention. Tentatively planned for Friday.   CHIEF COMPLAINT: left great toe blue and painful  HISTORY OF PRESENT ILLNESS: Alice Bennett is a 64 y.o. female known to our service for treatment of left great toe cyanosis having undergone endovascular intervention 12/10 - 10/18/24 with our service. A good result was achieved after one day of catheter directed thrombolysis and angioplasty. 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 She described return of symptoms over the past several days.   Past Medical History:  Diagnosis Date   CAD (coronary artery disease)    07/10/17 PCI/DES to LAD, EF 35-40%   Diabetes mellitus without complication (HCC)    Hyperlipidemia    Hypertension    NSTEMI (non-ST elevated myocardial infarction) [facility])     Past Surgical History:  Procedure Laterality Date   ABDOMINAL AORTOGRAM W/LOWER EXTREMITY N/A 07/26/2022   Procedure: ABDOMINAL AORTOGRAM W/LOWER EXTREMITY;  Surgeon: Court Dorn PARAS, MD;  Location: [facility] INVASIVE [facility];  Service: Cardiovascular;  Laterality: N/A;   ABDOMINAL AORTOGRAM W/LOWER EXTREMITY N/A 04/22/2023   Procedure: ABDOMINAL AORTOGRAM W/LOWER EXTREMITY;  Surgeon: Magda Debby SAILOR, MD;  Location: [facility];  Service: Cardiovascular;  Laterality: N/A;   AMPUTATION TOE Right 10/27/2023   Procedure: AMPUTATION TOE;  Surgeon: Malvin Marsa JULIANNA, DPM;  Location: [facility] OR[facility];  Service: Orthopedics/Podiatry;   Laterality: Right;  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   AORTOGRAM Bilateral 02/05/2023   Procedure: AORTOGRAM WITH RUN OFF;  Surgeon: Serene Gaile ORN, MD;  Location: [facility] OR;  Service: Vascular;  Laterality: Bilateral;   APPLICATION OF WOUND VAC Bilateral 05/04/2023   Procedure: APPLICATION OF WOUND VAC;  Surgeon: Serene Gaile ORN, MD;  Location: [facility] OR;  Service: Vascular;  Laterality: Bilateral;   CORONARY PRESSURE/FFR STUDY N/A 07/10/2017   Procedure: INTRAVASCULAR PRESSURE WIRE/FFR STUDY;  Surgeon: Jordan, Peter M, MD;  Location: [facility];  Service: Cardiovascular;  Laterality: N/A;   CORONARY PRESSURE/FFR STUDY N/A 05/31/2022   Procedure: INTRAVASCULAR PRESSURE WIRE/FFR STUDY;  Surgeon: Mady Bruckner, MD;  Location: [facility];  Service: Cardiovascular;  Laterality: N/A;   CORONARY STENT INTERVENTION N/A 07/10/2017   Procedure: CORONARY STENT INTERVENTION;  Surgeon: Jordan, Peter M, MD;  Location: [facility] INVASIVE CV LAB;  Service: Cardiovascular;  Laterality: N/A;   CORONARY STENT INTERVENTION N/A 05/31/2022   Procedure: CORONARY STENT INTERVENTION;  Surgeon: Mady Bruckner, MD;  [facility]: [facility];  Service: Cardiovascular;  Laterality: N/A;   FEMORAL-FEMORAL BYPASS GRAFT Bilateral 05/04/2023   Procedure: LEFT TO RIGHT FEMORAL-FEMORAL ARTERY BYPASS;  Surgeon: Serene Gaile ORN, MD;  Location: [facility];  Service: Vascular;  Laterality: Bilateral;   INSERTION OF ILIAC STENT Right 02/05/2023   Procedure: INSERTION OF RIGHT ILIAC STENT;  Surgeon: Serene Gaile ORN, MD;  Location: [facility];  Service: Vascular;  Laterality: Right;   LEFT HEART CATH AND CORONARY ANGIOGRAPHY N/A 07/10/2017   Procedure: LEFT HEART CATH AND CORONARY ANGIOGRAPHY;  Surgeon: Jordan, Peter M, MD;  Location: [facility] INVASIVE CV  LAB;  Service: Cardiovascular;  Laterality: N/A;   LOWER EXTREMITY ANGIOGRAM N/A 02/05/2023   Procedure: RIGHT LOWER EXTREMITY ANGIOGRAM;  Surgeon: Serene Gaile ORN, MD;  Location: [facility];  Service:  Vascular;  Laterality: N/A;   LOWER EXTREMITY ANGIOGRAPHY N/A 10/16/2024   Procedure: Lower Extremity Angiography;  Surgeon: Serene Gaile ORN, MD;  Location: [facility];  Service: Cardiovascular;  Laterality: N/A;   LOWER EXTREMITY INTERVENTION Right 10/17/2024   Procedure: LOWER EXTREMITY INTERVENTION;  Surgeon: Lanis Fonda BRAVO, MD;  Location: [facility] INVASIVE CV LAB;  Service: Cardiovascular;  Laterality: Right;   PERIPHERAL VASCULAR INTERVENTION  07/26/2022   Procedure: PERIPHERAL VASCULAR INTERVENTION;  Surgeon: Court Dorn PARAS, MD;  [facility]: [facility];  Service: Cardiovascular;;   PERIPHERAL VASCULAR INTERVENTION  04/22/2023   Procedure: PERIPHERAL VASCULAR INTERVENTION;  Surgeon: Magda Debby SAILOR, MD;  Location: [facility];  Service: Cardiovascular;;  right common and ecternal iliac, right common femoral   PERIPHERAL VASCULAR THROMBECTOMY  10/16/2024   Procedure: PERIPHERAL VASCULAR THROMBECTOMY;  Surgeon: Serene Gaile ORN, MD;  [facility]: [facility];  Service: Cardiovascular;;   PERIPHERAL VASCULAR THROMBECTOMY N/A 10/17/2024   Procedure: PERIPHERAL VASCULAR THROMBECTOMY;  Surgeon: Lanis Fonda BRAVO, MD;  Location[facility] INVASIVE CV LAB;  Service: Cardiovascular;  Laterality: N/A;   RIGHT/LEFT HEART CATH AND CORONARY ANGIOGRAPHY N/A 05/31/2022   Procedure: RIGHT/LEFT HEART CATH AND CORONARY ANGIOGRAPHY;  Surgeon: Mady Bruckner, MD;  [facility]: [facility];  Service: Cardiovascular;  Laterality: N/A;   ULTRASOUND GUIDANCE FOR VASCULAR ACCESS Bilateral 02/05/2023   Procedure: ULTRASOUND GUIDANCE FOR VASCULAR ACCESS;  Surgeon: Serene Gaile ORN, MD;  Location: [facility];  Service: Vascular;  Laterality: Bilateral;    No family history on file.  Social History   Socioeconomic History   Marital status: Single    Spouse name: Not on file   Number of children: Not on file   Years of education: Not on file   Highest education level: Not on file  Occupational  History   Not on file  Tobacco Use   Smoking status: Former    Current packs/day: 0.00    Average packs/day: 0.5 packs/day for 10.0 years (5.0 ttl pk-yrs)    Types: Cigarettes    Quit date: 04/2023    Years since quitting: 1.6    Passive exposure: Current   Smokeless tobacco: Never  Vaping Use   Vaping status: Never Used  Substance and Sexual Activity   Alcohol use: No   Drug use: No   Sexual activity: Not on file  Other Topics Concern   Not on file  Social History Narrative   Not on file   Social Drivers of Health   Tobacco Use: Low Risk (09/24/2024)   Received from [facility]   Patient History    Smoking Tobacco Use: Never    Smokeless Tobacco Use: Never    Passive Exposure: Not on file  Recent Concern: Tobacco Use - High Risk (09/03/2024)   Received from [facility]   Patient History    Smoking Tobacco Use: Every Day    Smokeless Tobacco Use: Never    Passive Exposure: Not on file  Financial Resource Strain: Not on file  Food Insecurity: No Food Insecurity (10/16/2024)   Epic    Worried About Radiation Protection Practitioner of Food in the Last Year: Never true    Ran Out of Food in the Last Year: Never true  Transportation Needs: No Transportation Needs (10/16/2024)  Epic    Lack of Transportation (Medical): No    Lack of Transportation (Non-Medical): No  Physical Activity: Not on file  Stress: No Stress Concern Present (08/09/2024)   Received from [facility] - Occupational Stress Questionnaire    Do you feel stress - tense, restless, nervous, or anxious, or unable to sleep at night because your mind is troubled all the time - these days?: Only a little  Social Connections: Not on file  Intimate Partner Violence: Not At Risk (10/16/2024)   Epic    Fear of Current or Ex-Partner: No    Emotionally Abused: No    Physically Abused: No    Sexually Abused: No  Depression (PHQ2-9): Not on file  Alcohol Screen: Not on file  Housing:  High Risk (10/16/2024)   Epic    Unable to Pay for Housing in the Last Year: No    Number of Times Moved in the Last Year: 2    Homeless in the Last Year: No  Utilities: Not At Risk (10/16/2024)   Epic    Threatened with loss of utilities: No  Health Literacy: Not on file    Allergies[1]  No current facility-administered medications for this encounter.   Current Outpatient Medications  Medication Sig Dispense Refill   apixaban  (ELIQUIS ) 5 MG TABS tablet Take 1 tablet (5 mg total) by mouth 2 (two) times daily. 60 tablet 1   aspirin  EC 81 MG tablet Take 1 tablet (81 mg total) by mouth daily. 30 tablet 1   atorvastatin  (LIPITOR ) 80 MG tablet Take 1 tablet (80 mg total) by mouth daily at 6 PM. 90 tablet 0   carvedilol  (COREG ) 3.125 MG tablet Take 2 tablets (6.25 mg total) by mouth 2 (two) times daily with a meal. 120 tablet 1   Cholecalciferol  (D3 HIGH POTENCY) 125 MCG (5000 UT) capsule Take 5,000 Units by mouth daily.     clonazePAM  (KLONOPIN ) 1 MG tablet Take 2 mg by mouth at bedtime.     ezetimibe  (ZETIA ) 10 MG tablet Take 1 tablet (10 mg total) by mouth daily. 90 tablet 1   FARXIGA  10 MG TABS tablet Take 10 mg by mouth daily.      fluticasone (FLONASE) 50 MCG/ACT nasal spray Place 1 spray into both nostrils 2 (two) times daily as needed for allergies or rhinitis.     nitroGLYCERIN  (NITRODUR - DOSED IN MG/24 HR) 0.2 mg/hr patch Place 1 patch (0.2 mg total) onto the skin daily. 30 patch 0   nitroGLYCERIN  (NITROSTAT ) 0.4 MG SL tablet Place 0.4 mg under the tongue every 5 (five) minutes as needed for chest pain.     [Paused] olmesartan  (BENICAR ) 20 MG tablet Take 1 tablet (20 mg total) by mouth daily. 90 tablet 3   OZEMPIC, 2 MG/DOSE, 8 MG/3ML SOPN Inject 2 mg into the skin every Sunday.     pantoprazole  (PROTONIX ) 40 MG tablet Take 1 tablet (40 mg total) by mouth daily. (Patient taking differently: Take 40 mg by mouth daily before breakfast.) 30 tablet 0   pregabalin  (LYRICA ) 150 MG capsule  Take 450 mg by mouth at bedtime.     sertraline  (ZOLOFT ) 100 MG tablet Take 100 mg by mouth daily.      PHYSICAL EXAM Vitals:   12/05/24 1213 12/05/24 1310 12/05/24 1845  BP: (!) 155/57  (!) 125/32  Pulse: 79  73  Resp: 15  17  Temp: 97.8 F (36.6 C)  TempSrc: Oral    SpO2: 94%  100%  Weight:  99.8 kg   Height:  5' 9 (1.753 m)    NO acute distress Regular rate and rhythm Unlabored breathing Brisk L PT doppler signal No L AT doppler signal Left great toe cyanotic.' Normal motor / sensory function in foot  PERTINENT LABORATORY AND RADIOLOGIC DATA  Most recent CBC    Latest Ref Rng & Units 12/05/2024    2:16 PM 10/19/2024    2:44 AM 10/18/2024    1:20 AM  CBC  WBC 4.0 - 10.5 K/uL 8.8  6.5  6.6   Hemoglobin 12.0 - 15.0 g/dL 85.6  88.3  88.4   Hematocrit 36.0 - 46.0 % 43.8  35.1  35.8   Platelets 150 - 400 K/uL 186  161  134      Most recent CMP    Latest Ref Rng & Units 12/05/2024    2:16 PM 10/19/2024    2:44 AM 10/18/2024    1:20 AM  CMP  Glucose 70 - 99 mg/dL 861  868  845   BUN 8 - 23 mg/dL 13  15  12    Creatinine 0.44 - 1.00 mg/dL 9.02  8.95  8.91   Sodium 135 - 145 mmol/L 140  137  138   Potassium 3.5 - 5.1 mmol/L 5.0  3.7  3.7   Chloride 98 - 111 mmol/L 102  105  107   CO2 22 - 32 mmol/L 27  26  24    Calcium  8.9 - 10.3 mg/dL 9.2  8.3  8.1     Renal function Estimated Creatinine Clearance: 74.6 mL/min (by C-G formula based on SCr of 0.97 mg/dL).  Hgb A1c MFr Bld (%)  Date Value  10/16/2024 7.0 (H)    LDL Chol Calc (NIH)  Date Value Ref Range Status  07/30/2024 117 (H) 0 - 99 mg/dL Final   LDL Cholesterol  Date Value Ref Range Status  10/18/2024 73 0 - 99 mg/dL Final    Comment:           Total Cholesterol/HDL:CHD Risk Coronary Heart Disease Risk Table                     Men   Women  1/2 Average Risk   3.4   3.3  Average Risk       5.0   4.4  2 X Average Risk   9.6   7.1  3 X Average Risk  23.4   11.0        Use the calculated  Patient Ratio above and the CHD Risk Table to determine the patient's CHD Risk.        ATP III CLASSIFICATION (LDL):  <100     mg/dL   Optimal  899-870  mg/dL   Near or Above                    Optimal  130-159  mg/dL   Borderline  839-810  mg/dL   High  >809     mg/dL   Very High Performed at [facility], [address] [place], [place] [address]      Debby SAILOR. Magda, MD [facility] Phone Number: [phone] 12/05/2024 7:04 PM   Total time spent on preparing this encounter including chart review, data review, collecting history, examining the patient, and coordinating care: 60 min  Portions of this report  may have been transcribed using voice recognition software.  Every effort has been made to ensure accuracy; however, inadvertent computerized transcription errors may still be present.       [1]  Allergies Allergen Reactions   Codeine Itching

## 2024-12-05 NOTE — ED Provider Triage Note (Signed)
 Emergency Medicine Provider Triage Evaluation Note  Alice Bennett , a 64 y.o. female  was evaluated in triage.  Pt complains of toe pain.  Patient reports discoloration pain to the left big toe.  About a month ago reportedly had ischemia of the left lower limb resulting in vascular surgery.  She states that she had resolution of the discoloration of the left great toe but this has not recurred.  No other symptoms that she has noted beyond pain to the left great toe.  Review of Systems  Positive: As above Negative: As above  Physical Exam  BP (!) 155/57   Pulse 79   Temp 97.8 F (36.6 C) (Oral)   Resp 15   Ht 5' 9 (1.753 m)   Wt 99.8 kg   SpO2 94%   BMI 32.49 kg/m  Gen:   Awake, no distress  Resp:  Normal effort  MSK:   Moves extremities without difficulty  Other:  Discoloration noted to the left great toe.  Unable to palpate DP or PT pulses to the left lower extremity.  Capillary refill between 3 to 4 seconds.  Medical Decision Making  Medically screening exam initiated at 1:42 PM.  Appropriate orders placed.  Alice Bennett was informed that the remainder of the evaluation will be completed by another provider, this initial triage assessment does not replace that evaluation, and the importance of remaining in the ED until their evaluation is complete.    Tesla Keeler A, PA-C 12/05/24 1343

## 2024-12-05 NOTE — ED Triage Notes (Signed)
 Pt reports left big toe pain and discoloration. Pt reports the bottom of the toe is blue. Pt reports this started last week.

## 2024-12-06 DIAGNOSIS — Z7901 Long term (current) use of anticoagulants: Secondary | ICD-10-CM

## 2024-12-06 DIAGNOSIS — I70222 Atherosclerosis of native arteries of extremities with rest pain, left leg: Secondary | ICD-10-CM

## 2024-12-06 DIAGNOSIS — R230 Cyanosis: Secondary | ICD-10-CM

## 2024-12-06 LAB — GLUCOSE, CAPILLARY
Glucose-Capillary: 182 mg/dL — ABNORMAL HIGH (ref 70–99)
Glucose-Capillary: 179 mg/dL — ABNORMAL HIGH (ref 70–99)
Glucose-Capillary: 180 mg/dL — ABNORMAL HIGH (ref 70–99)
Glucose-Capillary: 175 mg/dL — ABNORMAL HIGH (ref 70–99)

## 2024-12-06 LAB — CBC
HCT: 39.7 % (ref 36.0–46.0)
Hemoglobin: 13.4 g/dL (ref 12.0–15.0)
MCH: 29.1 pg (ref 26.0–34.0)
MCHC: 33.8 g/dL (ref 30.0–36.0)
MCV: 86.3 fL (ref 80.0–100.0)
Platelets: 159 10*3/uL (ref 150–400)
RBC: 4.6 MIL/uL (ref 3.87–5.11)
RDW: 15.4 % (ref 11.5–15.5)
WBC: 6.7 10*3/uL (ref 4.0–10.5)
nRBC: 0 % (ref 0.0–0.2)

## 2024-12-06 LAB — COMPREHENSIVE METABOLIC PANEL WITH GFR
ALT: 8 U/L (ref 0–44)
AST: 13 U/L — ABNORMAL LOW (ref 15–41)
Albumin: 3.3 g/dL — ABNORMAL LOW (ref 3.5–5.0)
Alkaline Phosphatase: 84 U/L (ref 38–126)
Anion gap: 9 (ref 5–15)
BUN: 10 mg/dL (ref 8–23)
CO2: 26 mmol/L (ref 22–32)
Calcium: 8.8 mg/dL — ABNORMAL LOW (ref 8.9–10.3)
Chloride: 103 mmol/L (ref 98–111)
Creatinine, Ser: 0.88 mg/dL (ref 0.44–1.00)
GFR, Estimated: >60 mL/min
Glucose, Bld: 183 mg/dL — ABNORMAL HIGH (ref 70–99)
Potassium: 3.4 mmol/L — ABNORMAL LOW (ref 3.5–5.1)
Sodium: 137 mmol/L (ref 135–145)
Total Bilirubin: <0.2 mg/dL (ref 0.0–1.2)
Total Protein: 6.5 g/dL (ref 6.5–8.1)

## 2024-12-06 LAB — APTT
aPTT: 66 s — ABNORMAL HIGH (ref 24–36)
aPTT: 54 s — ABNORMAL HIGH (ref 24–36)

## 2024-12-06 LAB — HEPARIN LEVEL (UNFRACTIONATED)
Heparin Unfractionated: 0.53 [IU]/mL (ref 0.30–0.70)
Heparin Unfractionated: 0.28 [IU]/mL — ABNORMAL LOW (ref 0.30–0.70)

## 2024-12-06 MED ORDER — CARVEDILOL 3.125 MG PO TABS
3.1250 mg | ORAL_TABLET | Freq: Two times a day (BID) | ORAL | Status: DC
  Administered 2024-12-06 – 2024-12-08 (×4): 3.125 mg via ORAL
  Filled 2024-12-06 (×4): qty 1

## 2024-12-06 MED ORDER — POTASSIUM CHLORIDE CRYS ER 20 MEQ PO TBCR
40.0000 meq | EXTENDED_RELEASE_TABLET | Freq: Once | ORAL | Status: AC
  Administered 2024-12-06: 40 meq via ORAL
  Filled 2024-12-06: qty 2

## 2024-12-06 NOTE — H&P (View-Only) (Signed)
" °  Progress Note    12/06/2024 8:50 AM * No surgery found *  Subjective: Says her left great toe still feels sore    Vitals:   12/06/24 0329 12/06/24 0824  BP: (!) 157/83 99/85  Pulse: 74 69  Resp: 19 20  Temp: 98.1 F (36.7 C) 97.8 F (36.6 C)  SpO2: 98% 96%    Physical Exam: General: Laying in bed, NAD Cardiac: Regular Lungs: Nonlabored Extremities: Left great toe discoloration  CBC    Component Value Date/Time   WBC 6.7 12/06/2024 0343   RBC 4.60 12/06/2024 0343   HGB 13.4 12/06/2024 0343   HGB 13.8 07/07/2022 1531   HCT 39.7 12/06/2024 0343   HCT 41.8 07/07/2022 1531   PLT 159 12/06/2024 0343   PLT 192 07/07/2022 1531   MCV 86.3 12/06/2024 0343   MCV 86 07/07/2022 1531   MCH 29.1 12/06/2024 0343   MCHC 33.8 12/06/2024 0343   RDW 15.4 12/06/2024 0343   RDW 14.2 07/07/2022 1531   LYMPHSABS 2.7 12/05/2024 1416   MONOABS 0.5 12/05/2024 1416   EOSABS 0.2 12/05/2024 1416   BASOSABS 0.0 12/05/2024 1416    BMET    Component Value Date/Time   NA 137 12/06/2024 0343   NA 142 07/07/2022 1531   K 3.4 (L) 12/06/2024 0343   CL 103 12/06/2024 0343   CO2 26 12/06/2024 0343   GLUCOSE 183 (H) 12/06/2024 0343   BUN 10 12/06/2024 0343   BUN 8 07/07/2022 1531   CREATININE 0.88 12/06/2024 0343   CALCIUM  8.8 (L) 12/06/2024 0343   GFRNONAA >60 12/06/2024 0343   GFRAA 53 (L) 06/09/2020 0331    INR    Component Value Date/Time   INR 0.93 07/10/2017 1106     Intake/Output Summary (Last 24 hours) at 12/06/2024 0850 Last data filed at 12/06/2024 0531 Gross per 24 hour  Intake 313.27 ml  Output --  Net 313.27 ml      Assessment/Plan:  64 y.o. female admitted with left great toe discoloration and rest pain   - She reports that her left great toe still feels very sore -Continue heparin  drip for Eliquis  washout -The patient remains agreeable for angiogram tomorrow.  Will plan for left lower extremity angiogram with Dr. Magda.  Will make n.p.o. at midnight  and place consent order   Ahmed Holster, PA-C [facility] [phone] 12/06/2024 8:50 AM  VASCULAR STAFF ADDENDUM: I have independently interviewed and examined the patient. I agree with the above.   Debby SAILOR. Magda, MD [facility] Phone Number: [phone] 12/06/2024 1:46 PM      "

## 2024-12-06 NOTE — Progress Notes (Addendum)
" °  Progress Note    12/06/2024 8:50 AM * No surgery found *  Subjective: Says her left great toe still feels sore    Vitals:   12/06/24 0329 12/06/24 0824  BP: (!) 157/83 99/85  Pulse: 74 69  Resp: 19 20  Temp: 98.1 F (36.7 C) 97.8 F (36.6 C)  SpO2: 98% 96%    Physical Exam: General: Laying in bed, NAD Cardiac: Regular Lungs: Nonlabored Extremities: Left great toe discoloration  CBC    Component Value Date/Time   WBC 6.7 12/06/2024 0343   RBC 4.60 12/06/2024 0343   HGB 13.4 12/06/2024 0343   HGB 13.8 07/07/2022 1531   HCT 39.7 12/06/2024 0343   HCT 41.8 07/07/2022 1531   PLT 159 12/06/2024 0343   PLT 192 07/07/2022 1531   MCV 86.3 12/06/2024 0343   MCV 86 07/07/2022 1531   MCH 29.1 12/06/2024 0343   MCHC 33.8 12/06/2024 0343   RDW 15.4 12/06/2024 0343   RDW 14.2 07/07/2022 1531   LYMPHSABS 2.7 12/05/2024 1416   MONOABS 0.5 12/05/2024 1416   EOSABS 0.2 12/05/2024 1416   BASOSABS 0.0 12/05/2024 1416    BMET    Component Value Date/Time   NA 137 12/06/2024 0343   NA 142 07/07/2022 1531   K 3.4 (L) 12/06/2024 0343   CL 103 12/06/2024 0343   CO2 26 12/06/2024 0343   GLUCOSE 183 (H) 12/06/2024 0343   BUN 10 12/06/2024 0343   BUN 8 07/07/2022 1531   CREATININE 0.88 12/06/2024 0343   CALCIUM  8.8 (L) 12/06/2024 0343   GFRNONAA >60 12/06/2024 0343   GFRAA 53 (L) 06/09/2020 0331    INR    Component Value Date/Time   INR 0.93 07/10/2017 1106     Intake/Output Summary (Last 24 hours) at 12/06/2024 0850 Last data filed at 12/06/2024 0531 Gross per 24 hour  Intake 313.27 ml  Output --  Net 313.27 ml      Assessment/Plan:  64 y.o. female admitted with left great toe discoloration and rest pain   - She reports that her left great toe still feels very sore -Continue heparin  drip for Eliquis  washout -The patient remains agreeable for angiogram tomorrow.  Will plan for left lower extremity angiogram with Dr. Magda.  Will make n.p.o. at midnight  and place consent order   Ahmed Holster, PA-C [facility] [phone] 12/06/2024 8:50 AM  VASCULAR STAFF ADDENDUM: I have independently interviewed and examined the patient. I agree with the above.   Debby SAILOR. Magda, MD [facility] Phone Number: [phone] 12/06/2024 1:46 PM      "

## 2024-12-06 NOTE — Plan of Care (Signed)
  Problem: Clinical Measurements: Goal: Will remain free from infection Outcome: Progressing   Problem: Activity: Goal: Risk for activity intolerance will decrease Outcome: Progressing   Problem: Nutrition: Goal: Adequate nutrition will be maintained Outcome: Progressing   Problem: Coping: Goal: Level of anxiety will decrease Outcome: Progressing   Problem: Skin Integrity: Goal: Risk for impaired skin integrity will decrease Outcome: Progressing

## 2024-12-06 NOTE — Progress Notes (Signed)
 PHARMACIST LIPID MONITORING   Alice Bennett is a 64 y.o. female admitted on 12/05/2024 with PVD.  Pharmacy has been consulted to optimize lipid-lowering therapy with the indication of secondary prevention for clinical ASCVD.  Patient/Protocol Exclusion: No, patient meets the inclusion criteria for the lipid protocol.   Recent Labs:  Lipid Panel (last 6 months):   Lab Results  Component Value Date   CHOL 146 10/18/2024   TRIG 210 (H) 10/18/2024   HDL 31 (L) 10/18/2024   CHOLHDL 4.7 10/18/2024   VLDL 42 (H) 10/18/2024   LDLCALC 73 10/18/2024    Hepatic function panel (last 6 months):   Lab Results  Component Value Date   AST 13 (L) 12/06/2024   ALT 8 12/06/2024   ALKPHOS 84 12/06/2024   BILITOT <0.2 12/06/2024    SCr (since admission):   Serum creatinine: 0.88 mg/dL 98/70/73 9656 Estimated creatinine clearance: 82.2 mL/min  Current therapy and lipid therapy tolerance Prior to admission lipid-lowering therapy: atorvastatin  80mg /day, ezetimibe  10mg /day Documented or reported allergies or intolerances to lipid-lowering therapies (if applicable): none LDL goal : < 55  Plan:    1.Statin intensity (high intensity recommended for all patients regardless of the LDL):  No statin changes. The patient is already on a high intensity statin.  2.Add ezetimibe  (if any one of the following):    -already on ezetimibe   3.Refer ([id number]) to [facility] pharmacist clinic:    -She already has an appointment with [facility] lipid clinic in February  4.Follow-up provider:   [facility] lipid clinic  5.Follow-up after discharge:  [facility] lipid clinic  Prentice Poisson, PharmD Clinical Pharmacist **Pharmacist phone directory can now be found on [email or web address] ([email or web address]).  Listed under [facility].

## 2024-12-06 NOTE — Progress Notes (Signed)
" °  Progress Note   Patient: Alice Bennett FMW:[id number] DOB: 07/26/1961 DOA: 12/05/2024     1 DOS: the patient was seen and examined on 12/06/2024   Brief hospital course: No notes on file 64 year old with past medical history relevant for coronary artery disease status post stents in 2018, type 2 diabetes, hypertension, hyperlipidemia, anxiety, tobacco abuse, peripheral chill disease status post left to right femoral-femoral bypass in 2024 in the setting of a failed iliac stent, recent endovascular intervention on 12/10 to 10/18/2024 for critical limb ischemia on left who presents with left toe discoloration and pain.   Assessment and Plan: No notes have been filed under this hospital service. Service: Hospitalist  #) Left toe ischemia/peripheral vascular disease: Known vasculopath.  History of left to right femorofemoral bypass in 2024, recent endovascular intervention on left in December 2025 for critical limb ischemia.  Appears to be possibly embolic. - Hold apixaban , on heparin  drip - Vascular surgery following, plan for angiogram tomorrow - Continue aspirin  81 mg - N.p.o. at midnight  #) Type 2 diabetes: Does not appear to be on any therapy for this - SSI ACHS  #) Hypertension/hyperlipidemia: Has had lower blood pressures. - Decrease home carvedilol  to 3.125 mg twice daily - Continue atorvastatin  80 mg, Zetia  10 mg  #) Coronary artery disease status post stent in 2018: - On aspirin , statin, beta-blocker, Zetia   Fluids: None Electrolytes: Monitor and supplement Nutrition: Low-cholesterol diet  Prophylaxis: Subcu heparin   Disposition: Pending endovascular intervention  Full code      Subjective: Eating breakfast, doing well  Physical Exam: Vitals:   12/05/24 2127 12/05/24 2325 12/06/24 0329 12/06/24 0824  BP: (!) 158/68 (!) 152/90 (!) 157/83 99/85  Pulse: 64 62 74 69  Resp: 18 18 19 20   Temp: 97.6 F (36.4 C) (!) 97.4 F (36.3 C) 98.1 F (36.7 C) 97.8 F (36.6  C)  TempSrc: Oral Oral Oral Oral  SpO2: 98% 100% 98% 96%  Weight:      Height:       General Appearance:  awake, alert, oriented, in no acute distress Skin: Well-healed incisions Head/face:  NCAT Eyes:  Sclera nonicteric Ears:  exam deferred Nose/Sinuses:  negative Mouth/Throat:  Mucosa moist, no lesions; pharynx without erythema, edema or exudate. Neck:  neck- supple, no mass, non-tender Lungs:  Normal expansion.  Clear to auscultation.  No rales, rhonchi, or wheezing. Heart:  Heart sounds are normal.  Regular rate and rhythm without murmur, gallop or rub. Abdomen:  Soft, non-tender, normal bowel sounds; no bruits, organomegaly or masses. Extremities: no edema and pale, left toe is tender to palpation at base, purple and blue.  No cap refill Musculoskeletal:  negative Peripheral Pulses: Decreased pulses in right and left lower extremity Neurologic: Diminished sensation in lower extremity particularly around toe Psych exam: Appears appropriate  Data Reviewed:  Potassium 3.4, glucose 133  Family Communication: Called  Disposition: Status is: Inpatient Remains inpatient appropriate because: Endovascular intervention  Planned Discharge Destination: Home      Author: Maybelle JAYSON Pelton, MD 12/06/2024 10:29 AM  For on call review [email or web address].  "

## 2024-12-06 NOTE — Progress Notes (Signed)
 PHARMACY - ANTICOAGULATION CONSULT NOTE  Pharmacy Consult for heparin  Indication: VTE  Allergies[1]  Patient Measurements: Height: 5' 9 (175.3 cm) Weight: 99.8 kg (220 lb) IBW/kg (Calculated) : 66.2 HEPARIN  DW (KG): 87.9  Vital Signs: Temp: 97.8 F (36.6 C) (01/29 1638) Temp Source: Oral (01/29 1638) BP: 152/69 (01/29 1638) Pulse Rate: 68 (01/29 1638)  Labs: Recent Labs    12/05/24 1416 12/06/24 0343 12/06/24 0758 12/06/24 1554  HGB 14.3 13.4  --   --   HCT 43.8 39.7  --   --   PLT 186 159  --   --   APTT  --   --  66* 54*  HEPARINUNFRC  --   --  0.53 0.28*  CREATININE 0.97 0.88  --   --     Estimated Creatinine Clearance: 82.2 mL/min (by C-G formula based on SCr of 0.88 mg/dL).   Medical History: Past Medical History:  Diagnosis Date   CAD (coronary artery disease)    07/10/17 PCI/DES to LAD, EF 35-40%   Diabetes mellitus without complication (HCC)    Hyperlipidemia    Hypertension    NSTEMI (non-ST elevated myocardial infarction) [facility])    Assessment: 67 yoF presented with recurrent left great toe cyanosis and rest pain. PMH includes recent endovascular intervention s/p catheter directed thrombolysis and angioplasty d/c'd on eliquis  (10/2024). Last dose of eliquis : 1/27 2100. Pharmacy consulted to dose heparin  for VTE  aPTT under goal range at current rate 1400 units/hr. No issues with heparin  infusion or interruptions reported per RN.  Goal of Therapy:  Heparin  level 0.3-0.7 units/ml aPTT 66-102 seconds Monitor platelets by anticoagulation protocol: Yes   Plan:  Increase heparin  infusion to 1550 units/hr Check heparin  level in 8 hours and daily while on heparin  Monitor with aPTTs until correlates with heparin  level Continue to monitor H&H and platelets  Thank you for allowing pharmacy to be a part of this patients care.  Shelba Collier, PharmD, BCPS Clinical Pharmacist     [1]  Allergies Allergen Reactions   Codeine Itching

## 2024-12-07 ENCOUNTER — Encounter (HOSPITAL_COMMUNITY): Admission: EM | Disposition: A | Payer: Self-pay | Source: Home / Self Care | Attending: Internal Medicine

## 2024-12-07 ENCOUNTER — Ambulatory Visit (HOSPITAL_COMMUNITY): Admission: RE | Admit: 2024-12-07 | Source: Home / Self Care | Admitting: Vascular Surgery

## 2024-12-07 DIAGNOSIS — R230 Cyanosis: Secondary | ICD-10-CM

## 2024-12-07 DIAGNOSIS — Z95828 Presence of other vascular implants and grafts: Secondary | ICD-10-CM

## 2024-12-07 DIAGNOSIS — Z95820 Peripheral vascular angioplasty status with implants and grafts: Secondary | ICD-10-CM

## 2024-12-07 DIAGNOSIS — I70229 Atherosclerosis of native arteries of extremities with rest pain, unspecified extremity: Secondary | ICD-10-CM | POA: Diagnosis not present

## 2024-12-07 LAB — CBC WITH DIFFERENTIAL/PLATELET
Abs Immature Granulocytes: 0.01 10*3/uL (ref 0.00–0.07)
Basophils Absolute: 0 10*3/uL (ref 0.0–0.1)
Basophils Relative: 1 %
Eosinophils Absolute: 0.1 10*3/uL (ref 0.0–0.5)
Eosinophils Relative: 2 %
HCT: 38.9 % (ref 36.0–46.0)
Hemoglobin: 13.1 g/dL (ref 12.0–15.0)
Immature Granulocytes: 0 %
Lymphocytes Relative: 39 %
Lymphs Abs: 2.5 10*3/uL (ref 0.7–4.0)
MCH: 29.2 pg (ref 26.0–34.0)
MCHC: 33.7 g/dL (ref 30.0–36.0)
MCV: 86.8 fL (ref 80.0–100.0)
Monocytes Absolute: 0.4 10*3/uL (ref 0.1–1.0)
Monocytes Relative: 6 %
Neutro Abs: 3.4 10*3/uL (ref 1.7–7.7)
Neutrophils Relative %: 52 %
Platelets: 152 10*3/uL (ref 150–400)
RBC: 4.48 MIL/uL (ref 3.87–5.11)
RDW: 15.1 % (ref 11.5–15.5)
WBC: 6.4 10*3/uL (ref 4.0–10.5)
nRBC: 0 % (ref 0.0–0.2)

## 2024-12-07 LAB — GLUCOSE, CAPILLARY
Glucose-Capillary: 149 mg/dL — ABNORMAL HIGH (ref 70–99)
Glucose-Capillary: 128 mg/dL — ABNORMAL HIGH (ref 70–99)
Glucose-Capillary: 77 mg/dL (ref 70–99)
Glucose-Capillary: 116 mg/dL — ABNORMAL HIGH (ref 70–99)
Glucose-Capillary: 189 mg/dL — ABNORMAL HIGH (ref 70–99)

## 2024-12-07 LAB — BASIC METABOLIC PANEL WITH GFR
Anion gap: 10 (ref 5–15)
BUN: 14 mg/dL (ref 8–23)
CO2: 23 mmol/L (ref 22–32)
Calcium: 8.6 mg/dL — ABNORMAL LOW (ref 8.9–10.3)
Chloride: 103 mmol/L (ref 98–111)
Creatinine, Ser: 1.01 mg/dL — ABNORMAL HIGH (ref 0.44–1.00)
GFR, Estimated: >60 mL/min
Glucose, Bld: 169 mg/dL — ABNORMAL HIGH (ref 70–99)
Potassium: 3.8 mmol/L (ref 3.5–5.1)
Sodium: 137 mmol/L (ref 135–145)

## 2024-12-07 LAB — MAGNESIUM: Magnesium: 1.7 mg/dL (ref 1.7–2.4)

## 2024-12-07 LAB — APTT
aPTT: 82 s — ABNORMAL HIGH (ref 24–36)
aPTT: 71 s — ABNORMAL HIGH (ref 24–36)

## 2024-12-07 LAB — PHOSPHORUS: Phosphorus: 3 mg/dL (ref 2.5–4.6)

## 2024-12-07 LAB — HEPARIN LEVEL (UNFRACTIONATED)
Heparin Unfractionated: 0.48 [IU]/mL (ref 0.30–0.70)
Heparin Unfractionated: 0.42 [IU]/mL (ref 0.30–0.70)

## 2024-12-07 MED ORDER — MIDAZOLAM HCL 2 MG/2ML IJ SOLN
INTRAMUSCULAR | Status: AC
  Filled 2024-12-07: qty 2

## 2024-12-07 MED ORDER — MIDAZOLAM HCL (PF) 2 MG/2ML IJ SOLN
INTRAMUSCULAR | Status: DC | PRN
  Administered 2024-12-07 (×2): 1 mg via INTRAVENOUS

## 2024-12-07 MED ORDER — SODIUM CHLORIDE 0.9 % WEIGHT BASED INFUSION
1.0000 mL/kg/h | INTRAVENOUS | Status: AC
  Administered 2024-12-07: 1 mL/kg/h via INTRAVENOUS

## 2024-12-07 MED ORDER — SODIUM CHLORIDE 0.9% FLUSH: 3.0000 mL | INTRAVENOUS | Status: DC | PRN

## 2024-12-07 MED ORDER — FENTANYL CITRATE (PF) 100 MCG/2ML IJ SOLN
INTRAMUSCULAR | Status: DC | PRN
  Administered 2024-12-07: 50 ug via INTRAVENOUS

## 2024-12-07 MED ORDER — ASPIRIN 81 MG PO TBEC: 81.0000 mg | DELAYED_RELEASE_TABLET | Freq: Every day | ORAL | Status: DC

## 2024-12-07 MED ORDER — FENTANYL CITRATE (PF) 100 MCG/2ML IJ SOLN
INTRAMUSCULAR | Status: AC
  Filled 2024-12-07: qty 2

## 2024-12-07 MED ORDER — LIDOCAINE HCL (PF) 1 % IJ SOLN
INTRAMUSCULAR | Status: AC
  Filled 2024-12-07: qty 30

## 2024-12-07 MED ORDER — HYDRALAZINE HCL 20 MG/ML IJ SOLN: 5.0000 mg | INTRAMUSCULAR | Status: DC | PRN

## 2024-12-07 MED ORDER — SODIUM CHLORIDE 0.9 % IV SOLN: 250.0000 mL | INTRAVENOUS | Status: DC | PRN

## 2024-12-07 MED ORDER — NITROGLYCERIN 0.4 MG/HR TD PT24
0.4000 mg | MEDICATED_PATCH | Freq: Every day | TRANSDERMAL | Status: DC
  Administered 2024-12-07 – 2024-12-08 (×2): 0.4 mg via TRANSDERMAL
  Filled 2024-12-07 (×2): qty 1

## 2024-12-07 MED ORDER — NICOTINE 7 MG/24HR TD PT24
7.0000 mg | MEDICATED_PATCH | Freq: Every day | TRANSDERMAL | Status: DC
  Filled 2024-12-07 (×2): qty 1

## 2024-12-07 MED ORDER — IODIXANOL 320 MG/ML IV SOLN
INTRAVENOUS | Status: DC | PRN
  Administered 2024-12-07: 50 mL

## 2024-12-07 MED ORDER — SODIUM CHLORIDE 0.9% FLUSH
3.0000 mL | Freq: Two times a day (BID) | INTRAVENOUS | Status: DC
  Administered 2024-12-08 (×2): 3 mL via INTRAVENOUS

## 2024-12-07 MED ORDER — LIDOCAINE HCL (PF) 1 % IJ SOLN
INTRAMUSCULAR | Status: DC | PRN
  Administered 2024-12-07: 15 mL

## 2024-12-07 MED ORDER — LABETALOL HCL 5 MG/ML IV SOLN: 10.0000 mg | INTRAVENOUS | Status: DC | PRN

## 2024-12-07 NOTE — Interval H&P Note (Signed)
 History and Physical Interval Note:  12/07/2024 1:09 PM  Alice Bennett  has presented today for surgery, with the diagnosis of I70.229.  The various methods of treatment have been discussed with the patient and family. After consideration of risks, benefits and other options for treatment, the patient has consented to  Procedures: ABDOMINAL AORTOGRAM W/LOWER EXTREMITY (N/A) as a surgical intervention.  The patient's history has been reviewed, patient examined, no change in status, stable for surgery.  I have reviewed the patient's chart and labs.  Questions were answered to the patient's satisfaction.     Malvina New

## 2024-12-07 NOTE — Plan of Care (Signed)
" °  Problem: Education: Goal: Knowledge of General Education information will improve Description: Including pain rating scale, medication(s)/side effects and non-pharmacologic comfort measures Outcome: Progressing   Problem: Clinical Measurements: Goal: Ability to maintain clinical measurements within normal limits will improve Outcome: Progressing Goal: Will remain free from infection Outcome: Progressing Goal: Diagnostic test results will improve Outcome: Progressing Goal: Respiratory complications will improve Outcome: Progressing Goal: Cardiovascular complication will be avoided Outcome: Progressing   Problem: Tissue Perfusion: Goal: Adequacy of tissue perfusion will improve Outcome: Progressing   Problem: Cardiovascular: Goal: Ability to achieve and maintain adequate cardiovascular perfusion will improve Outcome: Progressing Goal: Vascular access site(s) Level 0-1 will be maintained Outcome: Progressing   "

## 2024-12-07 NOTE — Progress Notes (Signed)
 PHARMACY - ANTICOAGULATION CONSULT NOTE  Pharmacy Consult for heparin  Indication: VTE  Labs: Recent Labs    12/05/24 1416 12/06/24 0343 12/06/24 0758 12/06/24 1554 12/07/24 0137  HGB 14.3 13.4  --   --  13.1  HCT 43.8 39.7  --   --  38.9  PLT 186 159  --   --  152  APTT  --   --  66* 54* 82*  HEPARINUNFRC  --   --  0.53 0.28* 0.48  CREATININE 0.97 0.88  --   --   --    Assessment/Plan:  64yo female therapeutic on heparin  after rate change. Will continue infusion at current rate of 1550 units/hr and confirm stable with additional PTT.  Marvetta Dauphin, PharmD, BCPS 12/07/2024 2:00 AM

## 2024-12-07 NOTE — Progress Notes (Signed)
 Patient back from procedure.

## 2024-12-07 NOTE — Progress Notes (Addendum)
 " PROGRESS NOTE    Alice Bennett  FMW:[id number] DOB: 1961-09-30 DOA: 12/05/2024 PCP: Silvano Angeline JULIANNA, NP   Brief Narrative:  64 year old with past medical history relevant for coronary artery disease status post stents in 2018, type 2 diabetes, hypertension, hyperlipidemia, anxiety, tobacco abuse, peripheral chill disease status post left to right femoral-femoral bypass in 2024 in the setting of a failed iliac stent, recent endovascular intervention on 12/10 to 10/18/2024 for critical limb ischemia on left who presents with left great toe discoloration and pain.  Vascular consulted  Plan for LE angiogram on 1/30   Assessment & Plan:  Principal Problem:   Critical lower limb ischemia (HCC) Active Problems:   Hypertension associated with diabetes (HCC)   Tobacco use   Peripheral arterial disease   CAD (coronary artery disease)   Type 2 diabetes mellitus with diabetic peripheral angiopathy without gangrene, without long-term current use of insulin  (HCC)    Left great toe ischemia/peripheral vascular disease: Known vasculopath.  History of left to right femorofemoral bypass in 2024, recent endovascular intervention on left in December 2025 for critical limb ischemia.  Appears to be possibly embolic. - Hold apixaban , on heparin  drip - Vascular surgery following, plan for LE angiogram today. - Continue aspirin  81 mg - N.p.o. at midnight   Type 2 diabetes mellitus: -A1c of 7% on 10/16/24. - SSI ACHS   Hypertension/hyperlipidemia:  - Decreased home carvedilol  to 3.125 mg twice daily - Continue atorvastatin  80 mg, Zetia  10 mg   Coronary artery disease status post DES to LAD in 2018: - On aspirin , statin, beta-blocker, Zetia  -Continue to smoke  Tobacco abuse: Counseled extensively regarding tobacco cessation. Ordered nicotine  patch  Major moderate depression,POA: Continue with sertraline  100 mg daily and klonopin  2 mg daily QHS.   Class I Obesity,POA: BMI of 32.5. Likely candidate  for outpatient initiation of GLP-1 RA therapy.   Disposition: Lives at home with her significant other.  DVT prophylaxis: On heparin  drip     Code Status: Full Code Family Communication:  None at the bedside Status is: Inpatient Remains inpatient appropriate because: Left great toe discoloration/ischemic, need LE angiogram    Subjective:  Complaining of pain in the left great toe. She first noticed the discoloration couple of weeks back. She does smoke 4-5 cigarettes per day and her significant other smokes heavily. Plan is to LE angiogram today.   Examination:  General exam: Appears calm and comfortable  Respiratory system: Clear to auscultation. Respiratory effort normal. Cardiovascular system: S1 & S2 heard, RRR. No JVD, murmurs, rubs, gallops or clicks. No pedal edema. Gastrointestinal system: Abdomen is nondistended, soft and nontender. No organomegaly or masses felt. Normal bowel sounds heard. Central nervous system: Alert and oriented. No focal neurological deficits. Extremities: Symmetric 5 x 5 power. Skin: purplish discoloration of left great toe  Wound 10/16/24 1100 Pressure Injury Buttocks Left Stage 1 -  Intact skin with non-blanchable redness of a localized area usually over a bony prominence. (Active)     Diet Orders (From admission, onward)     Start     Ordered   12/07/24 0001  Diet NPO time specified Except for: Ice Chips, Sips with Meds  Diet effective midnight       Question Answer Comment  Except for Ice Chips   Except for Sips with Meds      12/06/24 1240            Objective: Vitals:   12/06/24 1217 12/06/24 1638 12/06/24 1951 12/07/24  0817  BP: (!) 153/55 (!) 152/69 (!) 141/66 (!) 152/54  Pulse: 69 68 81   Resp: 20 20 20 20   Temp: 98.1 F (36.7 C) 97.8 F (36.6 C) 98.4 F (36.9 C) 98.4 F (36.9 C)  TempSrc: Oral Oral Oral Oral  SpO2: 97% 97% 97%   Weight:      Height:        Intake/Output Summary (Last 24 hours) at 12/07/2024  1114 Last data filed at 12/06/2024 2000 Gross per 24 hour  Intake 720 ml  Output --  Net 720 ml   Filed Weights   12/05/24 1310  Weight: 99.8 kg    Scheduled Meds:  aspirin  EC  81 mg Oral Daily   atorvastatin   80 mg Oral q1800   carvedilol   3.125 mg Oral BID WC   clonazePAM   2 mg Oral QHS   ezetimibe   10 mg Oral Daily   insulin  aspart  0-9 Units Subcutaneous TID WC   pantoprazole   40 mg Oral QAC breakfast   pregabalin   150 mg Oral q morning   And   pregabalin   300 mg Oral QHS   sertraline   100 mg Oral Daily   Continuous Infusions:  heparin  1,550 Units/hr (12/07/24 1048)    Nutritional status     Body mass index is 32.49 kg/m.  Data Reviewed:   CBC: Recent Labs  Lab 12/05/24 1416 12/06/24 0343 12/07/24 0137  WBC 8.8 6.7 6.4  NEUTROABS 5.4  --  3.4  HGB 14.3 13.4 13.1  HCT 43.8 39.7 38.9  MCV 89.0 86.3 86.8  PLT 186 159 152   Basic Metabolic Panel: Recent Labs  Lab 12/05/24 1416 12/06/24 0343 12/07/24 0137  NA 140 137 137  K 5.0 3.4* 3.8  CL 102 103 103  CO2 27 26 23   GLUCOSE 138* 183* 169*  BUN 13 10 14   CREATININE 0.97 0.88 1.01*  CALCIUM  9.2 8.8* 8.6*  MG  --   --  1.7  PHOS  --   --  3.0   GFR: Estimated Creatinine Clearance: 71.6 mL/min (A) (by C-G formula based on SCr of 1.01 mg/dL (H)). Liver Function Tests: Recent Labs  Lab 12/06/24 0343  AST 13*  ALT 8  ALKPHOS 84  BILITOT <0.2  PROT 6.5  ALBUMIN 3.3*   No results for input(s): LIPASE, AMYLASE in the last 168 hours. No results for input(s): AMMONIA in the last 168 hours. Coagulation Profile: No results for input(s): INR, PROTIME in the last 168 hours. Cardiac Enzymes: No results for input(s): CKTOTAL, CKMB, CKMBINDEX, TROPONINI in the last 168 hours. BNP (last 3 results) No results for input(s): PROBNP in the last 8760 hours. HbA1C: No results for input(s): HGBA1C in the last 72 hours. CBG: Recent Labs  Lab 12/06/24 0652 12/06/24 1216  12/06/24 1637 12/06/24 2118 12/07/24 0602  GLUCAP 182* 179* 180* 175* 149*   Lipid Profile: No results for input(s): CHOL, HDL, LDLCALC, TRIG, CHOLHDL, LDLDIRECT in the last 72 hours. Thyroid Function Tests: No results for input(s): TSH, T4TOTAL, FREET4, T3FREE, THYROIDAB in the last 72 hours. Anemia Panel: No results for input(s): VITAMINB12, FOLATE, FERRITIN, TIBC, IRON, RETICCTPCT in the last 72 hours. Sepsis Labs: No results for input(s): PROCALCITON, LATICACIDVEN in the last 168 hours.  No results found for this or any previous visit (from the past 240 hours).       Radiology Studies: CT ANGIO LOWER EXT BILAT W &/OR WO CONTRAST Result Date: 12/05/2024 EXAM: CTA BILATERAL LOWER EXTREMITY  12/05/2024 07:45:00 PM TECHNIQUE: Contrast-enhanced computed tomography angiography of the lower extremity was performed with multiplanar reconstructions. Maximum intensity projection images were created on a separate workstation and reviewed. Automated exposure control, iterative reconstruction, and/or weight based adjustment of the mA/kV was utilized to reduce the radiation dose to as low as reasonably achievable. 100 mL of iohexol  (OMNIPAQUE ) 350 MG/ML injection was administered. COMPARISON: 10/15/2024 CLINICAL HISTORY: Left lower extremity toe is purple, history of arterial occlusion, no pulses. Abnormal pulse exam with no pulses in the left foot, left foot digital cyanosis. FINDINGS: ARTERIAL: LEFT COMMON ILIAC ARTERY: Widely patent. LEFT EXTERNAL ILIAC ARTERY: Patent. COMMON FEMORAL ARTERY: Left: No hemodynamically significant stenosis (less than 50% stenosis noted just proximal to the bypass graft, but not reported as per instructions). Right: The right common femoral artery is patent. RIGHT COMMON ILIAC ARTERY: Stented and chronically occluded. RIGHT INTERNAL ILIAC ARTERY: Proximally occluded and reconstituted via iliolumbar collaterals. RIGHT EXTERNAL ILIAC  ARTERY: Patent. FEMOROFEMORAL BYPASS GRAFT: Femorofemoral bypass grafting is again identified and is widely patent. SUPERFICIAL FEMORAL ARTERY: Left: Interval recanalization. Now demonstrates patency with a focal 50% stenosis within the adductor canal (202/4). Right: Patent but demonstrates a focal 50% stenosis within the adductor canal (202/4). PROFUNDA FEMORAL ARTERY: Left: Abrupt occlusion just distal to its second muscular perforating branch, unchanged from prior examination. Right: Abrupt occlusion just distal to its second muscular perforating branch, unchanged from prior examination. POPLITEAL ARTERY: Left: Widely patent. Right: Wide patency. TIBIOPERONEAL TRUNK: Left: The tibioperoneal trunk is patent. Right: The tibioperoneal trunk is patent. ANTERIOR TIBIAL ARTERY: Left: Occluded shortly after its takeoff. Right: Occluded shortly after its takeoff. PERONEAL ARTERY: Left: Does not clearly reconstitute the dorsalis pedis artery though this may be related to relatively delayed enhancement of the left foot in relation to the right. Right: Reconstitutes the dorsalis pedis artery and lateral calcaneal artery. POSTERIOR TIBIAL ARTERY: Left: Patent and forms the plantar arch. Right: Forms the plantar arch. BONES AND SOFT TISSUES: No significant osseous or soft tissue abnormalities seen within the field of view. IMPRESSION: 1. Left lower extremity arterial inflow is widely patent, with a widely patent femorofemoral bypass graft. 2. Left lower extremity arterial outflow demonstrates interval recanalization with patency of the superficial femoral artery, focal 50% stenosis within the adductor canal, and chronic abrupt occlusion of the profunda femoral artery just distal to its second muscular perforating branch. 3. Left lower extremity arterial runoff demonstrates occlusion of the anterior tibial artery shortly after its takeoff with 2-vessel runoff to the ankle via the peroneal and posterior tibial arteries, without  clear reconstitution of the dorsalis pedis artery from the peroneal artery, which may be related to relatively delayed enhancement. The plantar arch is patent. 4. Right lower extremity arterial inflow is stented and chronically occluded, with proximal occlusion of the right internal iliac artery and reconstitution via iliolumbar collaterals. 5. Right lower extremity arterial outflow is patent with focal 50% stenosis within the adductor canal and a widely patent popliteal artery. 6. Right lower extremity arterial runoff demonstrates occlusion of the anterior tibial artery shortly after its takeoff with 2-vessel runoff to the ankle via the peroneal and posterior tibial arteries, with reconstitution of the dorsalis pedis artery and plantar arch . Electronically signed by: Dorethia Molt MD 12/05/2024 08:01 PM EST RP Workstation: [id number]   DG Toe Great Left Result Date: 12/05/2024 CLINICAL DATA:  Pain and discoloration of the LEFT great toe EXAM: LEFT GREAT TOE COMPARISON:  LEFT foot radiographs 10/15/2024 FINDINGS: Mild diffuse soft tissue  swelling of the great toe without underlying fracture or dislocation. No radiopaque foreign bodies. Mild hallux valgus deformity of the great toe. Mild degenerative changes of the first metatarsophalangeal joint. IMPRESSION: Mild diffuse soft tissue swelling of the LEFT great toe, without underlying fracture or dislocation. Electronically Signed   By: Aliene Lloyd M.D.   On: 12/05/2024 13:47           LOS: 2 days   Time spent= 35 mins    Deliliah Room, MD [facility]  If 7PM-7AM, please contact night-coverage  12/07/2024, 11:14 AM  "

## 2024-12-07 NOTE — Progress Notes (Signed)
 PHARMACY - ANTICOAGULATION CONSULT NOTE  Pharmacy Consult for heparin  Indication: r/o VTE, LLE PAD   Allergies[1]  Patient Measurements: Height: 5' 9 (175.3 cm) Weight: 99.8 kg (220 lb) IBW/kg (Calculated) : 66.2 HEPARIN  DW (KG): 87.9  Vital Signs: Temp: 98.4 F (36.9 C) (01/30 0817) Temp Source: Oral (01/30 0817) BP: 152/54 (01/30 0817)  Labs: Recent Labs    12/05/24 1416 12/06/24 0343 12/06/24 0758 12/06/24 1554 12/07/24 0137 12/07/24 0924  HGB 14.3 13.4  --   --  13.1  --   HCT 43.8 39.7  --   --  38.9  --   PLT 186 159  --   --  152  --   APTT  --   --    < > 54* 82* 71*  HEPARINUNFRC  --   --    < > 0.28* 0.48 0.42  CREATININE 0.97 0.88  --   --  1.01*  --    < > = values in this interval not displayed.    Estimated Creatinine Clearance: 71.6 mL/min (A) (by C-G formula based on SCr of 1.01 mg/dL (H)).   Medical History: Past Medical History:  Diagnosis Date   CAD (coronary artery disease)    07/10/17 PCI/DES to LAD, EF 35-40%   Diabetes mellitus without complication (HCC)    Hyperlipidemia    Hypertension    NSTEMI (non-ST elevated myocardial infarction) [facility])    Assessment: 53 yoF presented with recurrent left great toe cyanosis and rest pain. PMH includes recent endovascular intervention s/p catheter directed thrombolysis and angioplasty. Discharged on eliquis  (10/2024), last dose 1/27 2100. Apixaban  now held for possible procedure. Pending doppler for possible LE DVT. Pharmacy consulted to dose heparin .   Heparin  level 0.42 and is correlating with APT 71, both are therapeutic on 1550 units/hr. Will stop APTT monitoring.   Goal of Therapy:  Heparin  level 0.3-0.7 units/ml aPTT 66-102 seconds Monitor platelets by anticoagulation protocol: Yes   Plan:  Heparin  infusion 1550 units/hr Monitor daily heparin  level, CBC, signs/symptoms of bleeding  F/u restart apixaban    Thank you for allowing pharmacy to be a part of this patients care.  Jinnie Door,  PharmD, BCPS, BCCP Clinical Pharmacist  Please check AMION for all [facility] Pharmacy phone numbers After 10:00 PM, call Main Pharmacy [phone]     [1]  Allergies Allergen Reactions   Codeine Itching

## 2024-12-07 NOTE — Op Note (Signed)
" ° ° °  Patient name: Alice Bennett MRN: [id number] DOB: [birth year] Sex: female  12/07/2024 Pre-operative Diagnosis: Left blue toe Post-operative diagnosis:  Same Surgeon:  Malvina New Procedure Performed:  1.  Ultrasound-guided access, left to right femoral-femoral bypass graft in the right groin  2.  Bilateral lower extremity angiogram  3.  Catheter placement and left common femoral artery  4.  Conscious sedation, 16 minutes  5.  Closure device, Celt    Indications: This is a 64 year old female with history of iliac stents which ultimately occluded and led to a left to right femoral-femoral bypass graft.  She was in the hospital 2 months ago with a left gluteal toe and was found to have left SFA occlusion which was treated with thrombolysis.  She had been doing well until she developed new symptoms in her toe and comes in for angiography.  Procedure:  The patient was identified in the holding area and taken to room 8.  The patient was then placed supine on the table and prepped and draped in the usual sterile fashion.  A time out was called.  Conscious sedation was administered with the use of IV fentanyl  and Versed  under continuous physician and nurse monitoring.  Heart rate, blood pressure, and oxygen saturation were continuously monitored.  Total sedation time was 16 minutes.  Ultrasound was used to evaluate the right common femoral artery and femoral-femoral bypass graft.  It was patent .  A digital ultrasound image was acquired.  A micropuncture needle was used to access the femoral-femoral bypass graft in the right groin under ultrasound guidance.  An 018 wire was advanced without resistance and a micropuncture sheath was placed into the femoral-femoral graft.  The 018 wire was removed and a benson wire was placed.  The micropuncture sheath was exchanged for a 5 french sheath.  I then advanced a Berenstein 2 catheter into the left common femoral artery and performed angiography of the left leg  followed by right leg injections through the sheath.  The sheath was removed and the access site closed with a Celt  Findings:     Right Lower Extremity: The femorofemoral bypass graft anastomosis in the right groin is widely patent.  The common femoral artery is widely patent as is the profunda.  The superficial femoral artery and popliteal artery are patent without hemodynamically significant stenosis.  The dominant runoff is the posterior tibial artery  Left Lower Extremity: Left common femoral is widely patent.  The profunda and its major branches appear to be patent.  The superficial femoral artery and popliteal artery are widely patent without hemodynamically significant stenosis.  The dominant runoff is the posterior tibial artery which does perfuse the digital arteries   Impression:  #1  Widely patent left to right femoral-femoral bypass graft  #2  No hemodynamically significant lesions were identified in either extremity.  The dominant runoff is the posterior tibial artery bilaterally  #3  No obvious explanation for her blue toe.  #4  Recommend restarting Eliquis  in 8 hours along with aspirin .  Will also add nitroglycerin  patches to her foot.  #5  Anticipate discharge home in the morning                                        V. Malvina New, M.D., [facility] [place] Office: [phone] Pager:  [phone]  "

## 2024-12-08 ENCOUNTER — Other Ambulatory Visit (HOSPITAL_COMMUNITY): Payer: Self-pay

## 2024-12-08 ENCOUNTER — Encounter (HOSPITAL_COMMUNITY): Payer: Self-pay | Admitting: Surgery

## 2024-12-08 DIAGNOSIS — Z7901 Long term (current) use of anticoagulants: Secondary | ICD-10-CM

## 2024-12-08 DIAGNOSIS — I70229 Atherosclerosis of native arteries of extremities with rest pain, unspecified extremity: Secondary | ICD-10-CM | POA: Diagnosis not present

## 2024-12-08 LAB — CBC
HCT: 36.9 % (ref 36.0–46.0)
Hemoglobin: 12.2 g/dL (ref 12.0–15.0)
MCH: 28.9 pg (ref 26.0–34.0)
MCHC: 33.1 g/dL (ref 30.0–36.0)
MCV: 87.4 fL (ref 80.0–100.0)
Platelets: 149 10*3/uL — ABNORMAL LOW (ref 150–400)
RBC: 4.22 MIL/uL (ref 3.87–5.11)
RDW: 15.2 % (ref 11.5–15.5)
WBC: 6.6 10*3/uL (ref 4.0–10.5)
nRBC: 0 % (ref 0.0–0.2)

## 2024-12-08 LAB — GLUCOSE, CAPILLARY: Glucose-Capillary: 178 mg/dL — ABNORMAL HIGH (ref 70–99)

## 2024-12-08 LAB — LIPID PANEL
Cholesterol: 129 mg/dL (ref 0–200)
HDL: 34 mg/dL — ABNORMAL LOW
LDL Cholesterol: 66 mg/dL (ref 0–99)
Total CHOL/HDL Ratio: 3.8 ratio
Triglycerides: 146 mg/dL
VLDL: 29 mg/dL (ref 0–40)

## 2024-12-08 LAB — HEPARIN LEVEL (UNFRACTIONATED): Heparin Unfractionated: <0.1 [IU]/mL — ABNORMAL LOW (ref 0.30–0.70)

## 2024-12-08 MED ORDER — NITROGLYCERIN 0.4 MG/HR TD PT24
0.4000 mg | MEDICATED_PATCH | Freq: Every day | TRANSDERMAL | 0 refills | Status: AC
  Filled 2024-12-08: qty 10, 10d supply, fill #0

## 2024-12-08 MED ORDER — APIXABAN 5 MG PO TABS
5.0000 mg | ORAL_TABLET | Freq: Two times a day (BID) | ORAL | 1 refills | Status: AC
  Filled 2024-12-08: qty 60, 30d supply, fill #0

## 2024-12-08 MED ORDER — APIXABAN 5 MG PO TABS
5.0000 mg | ORAL_TABLET | Freq: Two times a day (BID) | ORAL | Status: DC
  Administered 2024-12-08: 5 mg via ORAL
  Filled 2024-12-08: qty 1

## 2024-12-08 NOTE — Progress Notes (Addendum)
" °  Progress Note    12/08/2024 8:24 AM 1 Day Post-Op  Subjective: No complaints, denies any pain in the left great toe currently    Vitals:   12/08/24 0735 12/08/24 0805  BP: (!) 146/53 (!) 146/53  Pulse: 65 64  Resp: 20   Temp: 98.1 F (36.7 C)   SpO2: 97%     Physical Exam: General: Laying in bed, NAD Cardiac: Regular Lungs: Nonlabored Incisions: Right groin access site soft and intact without hematoma Extremities: Brisk left PT Doppler signal   CBC    Component Value Date/Time   WBC 6.6 12/08/2024 0316   RBC 4.22 12/08/2024 0316   HGB 12.2 12/08/2024 0316   HGB 13.8 07/07/2022 1531   HCT 36.9 12/08/2024 0316   HCT 41.8 07/07/2022 1531   PLT 149 (L) 12/08/2024 0316   PLT 192 07/07/2022 1531   MCV 87.4 12/08/2024 0316   MCV 86 07/07/2022 1531   MCH 28.9 12/08/2024 0316   MCHC 33.1 12/08/2024 0316   RDW 15.2 12/08/2024 0316   RDW 14.2 07/07/2022 1531   LYMPHSABS 2.5 12/07/2024 0137   MONOABS 0.4 12/07/2024 0137   EOSABS 0.1 12/07/2024 0137   BASOSABS 0.0 12/07/2024 0137    BMET    Component Value Date/Time   NA 137 12/07/2024 0137   NA 142 07/07/2022 1531   K 3.8 12/07/2024 0137   CL 103 12/07/2024 0137   CO2 23 12/07/2024 0137   GLUCOSE 169 (H) 12/07/2024 0137   BUN 14 12/07/2024 0137   BUN 8 07/07/2022 1531   CREATININE 1.01 (H) 12/07/2024 0137   CALCIUM  8.6 (L) 12/07/2024 0137   GFRNONAA >60 12/07/2024 0137   GFRAA 53 (L) 06/09/2020 0331    INR    Component Value Date/Time   INR 0.93 07/10/2017 1106     Intake/Output Summary (Last 24 hours) at 12/08/2024 0824 Last data filed at 12/08/2024 0419 Gross per 24 hour  Intake 1460.6 ml  Output --  Net 1460.6 ml      Assessment/Plan:  64 y.o. female is 1 day postop, s/p: Diagnostic bilateral lower extremity angiogram  -The patient underwent diagnostic bilateral lower extremity angiogram yesterday for discoloration of the left great toe.  There were no hemodynamically significant lesions  or thrombus in either leg.  Her dominant runoff on the left is the PT which does perfuse the toes -The patient's heparin  drip has been discontinued.  She can restart her Eliquis  this morning along with daily ASA 81 mg -The patient has also been prescribed nitroglycerin  patches to put on the left foot to help with perfusion to the great toe -On exam her right groin access site is soft without hematoma.  She has a brisk left PT Doppler signal. -She is stable for discharge home today on Eliquis , aspirin , and statin.  Will arrange follow-up with our office in 4 to 6 weeks with LLE arterial duplex and ABIs   Ahmed Holster, PA-C [facility] [phone] 12/08/2024 8:24 AM  I agree with the above.  I have seen and evaluated the patient.  She is status post angiography yesterday that shows no obvious source for her blue toe.  She has a nitroglycerin  patch on her toe.  She will need Eliquis  and aspirin .  Will follow-up with her in the office.  She is stable for discharge  Wells Berda Shelvin "

## 2024-12-08 NOTE — Discharge Summary (Signed)
 " Physician Discharge Summary   Patient: Alice Bennett MRN: [id number] DOB: 06-08-1961  Admit date:     12/05/2024  Discharge date: 12/08/24  Discharge Physician: Deliliah Room   PCP: Silvano Angeline JULIANNA, NP   Recommendations at discharge:    F/u with your PCP in one week Follow-up with vascular surgeon in 1 month. Continue take medications as prescribed Return to the emergency room if you develop bleeding from any site, chest pain, fever, worsening lower extremity/foot pain or shortness of breath.  Discharge Diagnoses: Principal Problem:   Critical lower limb ischemia (HCC) Active Problems:   Hypertension associated with diabetes (HCC)   Tobacco use   Peripheral arterial disease   CAD (coronary artery disease)   Type 2 diabetes mellitus with diabetic peripheral angiopathy without gangrene, without long-term current use of insulin  [facility])    Hospital Course:  64 year old with past medical history relevant for coronary artery disease status post stents in 2018, type 2 diabetes, hypertension, hyperlipidemia, anxiety, tobacco abuse, peripheral chill disease status post left to right femoral-femoral bypass in 2024 in the setting of a failed iliac stent, recent endovascular intervention on 12/10 to 10/18/2024 for critical limb ischemia on left who presents with left great toe discoloration and pain.   Left great toe ischemia/peripheral vascular disease: Known vasculopath.  History of left to right femorofemoral bypass in 2024, recent endovascular intervention on left in December 2025 for critical limb ischemia.  - Off of heparin  drip.  Restart Eliquis  5 mg p.o. twice daily - Vascular surgery on board.  On 12/07/2024, she underwent diagnostic bilateral lower extremity angiogram for discoloration of the left great toe. There were no hemodynamically significant lesions or thrombus in either leg. Her dominant runoff on the left is the PT which does perfuse the toes  - Continue aspirin  81 mg,  Eliquis  and statin -  outpatient follow-up with vascular surgery in 1 month with left lower extremity arterial duplex and ABIs. -She was also prescribed nitroglycerin  patch for her left great toe and she had no evidence of left great toe discoloration at the time of the discharge.   Type 2 diabetes mellitus: -A1c of 7% on 10/16/24. - Continue with home regimen   Hypertension/hyperlipidemia:  - Decreased home carvedilol  to 3.125 mg twice daily - Continue atorvastatin  80 mg, Zetia  10 mg   Coronary artery disease status post DES to LAD in 2018: - On aspirin , statin, beta-blocker, Zetia  -Continues to smoke, counseled extensively regarding tobacco cessation   Tobacco abuse: Counseled extensively regarding tobacco cessation. Ordered nicotine  patch   Major moderate depression,POA: Continue with sertraline  100 mg daily and klonopin  2 mg daily QHS.    Class I Obesity,POA: BMI of 32.5.  On Ozempic at home  Disposition: Lives at home with her significant other.     Consultants: Vascular surgery Procedures performed: : Diagnostic bilateral lower extremity angiogram   Disposition: Home Diet recommendation:  Cardiac diet DISCHARGE MEDICATION: Allergies as of 12/08/2024       Reactions   Codeine Itching        Medication List     STOP taking these medications    nitroGLYCERIN  0.2 mg/hr patch Commonly known as: NITRODUR - Dosed in mg/24 hr   nitroGLYCERIN  0.4 MG SL tablet Commonly known as: NITROSTAT  Replaced by: nitroGLYCERIN  0.4 mg/hr patch   olmesartan  20 MG tablet Commonly known as: BENICAR        TAKE these medications    albuterol  108 (90 Base) MCG/ACT inhaler Commonly known as: VENTOLIN   HFA Inhale 1 puff into the lungs every 6 (six) hours as needed for shortness of breath.   apixaban  5 MG Tabs tablet Commonly known as: ELIQUIS  Take 1 tablet (5 mg total) by mouth 2 (two) times daily.   aspirin  EC 81 MG tablet Take 1 tablet (81 mg total) by mouth daily.    atorvastatin  80 MG tablet Commonly known as: LIPITOR  Take 1 tablet (80 mg total) by mouth daily at 6 PM.   carvedilol  3.125 MG tablet Commonly known as: COREG  Take 2 tablets (6.25 mg total) by mouth 2 (two) times daily with a meal.   clonazePAM  1 MG tablet Commonly known as: KLONOPIN  Take 2 mg by mouth at bedtime.   D3 High Potency 125 MCG (5000 UT) capsule Generic drug: Cholecalciferol  Take 5,000 Units by mouth daily.   ezetimibe  10 MG tablet Commonly known as: ZETIA  Take 1 tablet (10 mg total) by mouth daily.   Farxiga  10 MG Tabs tablet Generic drug: dapagliflozin  propanediol Take 10 mg by mouth daily.   fluticasone 50 MCG/ACT nasal spray Commonly known as: FLONASE Place 1 spray into both nostrils 2 (two) times daily as needed for allergies or rhinitis.   nitroGLYCERIN  0.4 mg/hr patch Commonly known as: NITRODUR - Dosed in mg/24 hr Place 1 patch (0.4 mg total) onto the skin daily. Apply to left great toe Start taking on: December 09, 2024 Replaces: nitroGLYCERIN  0.4 MG SL tablet   Ozempic (2 MG/DOSE) 8 MG/3ML Sopn Generic drug: Semaglutide (2 MG/DOSE) Inject 2 mg into the skin once a week.   pantoprazole  40 MG tablet Commonly known as: PROTONIX  Take 1 tablet (40 mg total) by mouth daily. What changed: when to take this   pregabalin  150 MG capsule Commonly known as: LYRICA  Take 150-300 mg by mouth See admin instructions. Take one tablet by mouth in the morning and then take two tablet by mouth in the evening because of severe toe pain per patient   sertraline  100 MG tablet Commonly known as: ZOLOFT  Take 100 mg by mouth daily.        Follow-up Information     [facility] Follow up in 4 week(s).   Specialty: Vascular Surgery Why: Office will call to arrange your appt(s) Contact information: [address] [place]  [address] [phone]        Silvano Angeline FALCON, NP.  Schedule an appointment as soon as possible for a visit in 1 week(s).   Contact information: [address] [place] [address] [phone]                Discharge Exam: [name] Weights   12/05/24 1310  Weight: 99.8 kg   Constitutional: NAD, calm, comfortable Eyes: PERRL, lids and conjunctivae normal ENMT: Mucous membranes are moist. Posterior pharynx clear of any exudate or lesions.Normal dentition.  Neck: normal, supple, no masses, no thyromegaly Respiratory: clear to auscultation bilaterally, no wheezing, no crackles. Normal respiratory effort. No accessory muscle use.  Cardiovascular: Regular rate and rhythm, no murmurs / rubs / gallops. No extremity edema. 2+ pedal pulses. No carotid bruits.  Abdomen: no tenderness, no masses palpated. No hepatosplenomegaly. Bowel sounds positive.  Musculoskeletal: no clubbing / cyanosis. No joint deformity upper and lower extremities. Good ROM, no contractures. Normal muscle tone.  Skin: no rashes, lesions, ulcers. No induration Neurologic: CN 2-12 grossly intact. Sensation intact, DTR normal. Strength 5/5 x all 4 extremities.  Psychiatric: Normal judgment and insight. Alert and oriented x 3. Normal mood.    Condition at discharge: good  The results of significant diagnostics from this hospitalization (including imaging, microbiology, ancillary and laboratory) are listed below for reference.   Imaging Studies: PERIPHERAL VASCULAR CATHETERIZATION Result Date: 12/07/2024 Patient name: TEKA CHANDA MRN: [id number] DOB: [birth year] Sex: female 12/07/2024 Pre-operative Diagnosis: Left blue toe Post-operative diagnosis:  Same Surgeon:  Malvina New Procedure Performed:  1.  Ultrasound-guided access, left to right femoral-femoral bypass graft in the right groin  2.  Bilateral lower extremity angiogram  3.  Catheter placement and left common femoral artery  4.  Conscious sedation, 16 minutes  5.  Closure device, Celt  Indications: This is a  64 year old female with history of iliac stents which ultimately occluded and led to a left to right femoral-femoral bypass graft.  She was in the hospital 2 months ago with a left gluteal toe and was found to have left SFA occlusion which was treated with thrombolysis.  She had been doing well until she developed new symptoms in her toe and comes in for angiography. Procedure:  The patient was identified in the holding area and taken to room 8.  The patient was then placed supine on the table and prepped and draped in the usual sterile fashion.  A time out was called.  Conscious sedation was administered with the use of IV fentanyl  and Versed  under continuous physician and nurse monitoring.  Heart rate, blood pressure, and oxygen saturation were continuously monitored.  Total sedation time was 16 minutes.  Ultrasound was used to evaluate the right common femoral artery and femoral-femoral bypass graft.  It was patent .  A digital ultrasound image was acquired.  A micropuncture needle was used to access the femoral-femoral bypass graft in the right groin under ultrasound guidance.  An 018 wire was advanced without resistance and a micropuncture sheath was placed into the femoral-femoral graft.  The 018 wire was removed and a benson wire was placed.  The micropuncture sheath was exchanged for a 5 french sheath.  I then advanced a Berenstein 2 catheter into the left common femoral artery and performed angiography of the left leg followed by right leg injections through the sheath.  The sheath was removed and the access site closed with a Celt Findings:   Right Lower Extremity: The femorofemoral bypass graft anastomosis in the right groin is widely patent.  The common femoral artery is widely patent as is the profunda.  The superficial femoral artery and popliteal artery are patent without hemodynamically significant stenosis.  The dominant runoff is the posterior tibial artery  Left Lower Extremity: Left common  femoral is widely patent.  The profunda and its major branches appear to be patent.  The superficial femoral artery and popliteal artery are widely patent without hemodynamically significant stenosis.  The dominant runoff is the posterior tibial artery which does perfuse the digital arteries Impression:  #1  Widely patent left to right femoral-femoral bypass graft  #2  No hemodynamically significant lesions were identified in either extremity.  The dominant runoff is the posterior tibial artery bilaterally  #3  No obvious explanation for her blue toe.  #4  Recommend restarting Eliquis  in 8 hours along with aspirin .  Will also add nitroglycerin  patches to her foot.  #5  Anticipate discharge home in the morning  ALONSO Malvina New, M.D., FACS [facility] Office: [phone] Pager:  [phone]   CT ANGIO LOWER EXT BILAT W &/OR WO CONTRAST Result Date: 12/05/2024 EXAM: CTA BILATERAL LOWER EXTREMITY 12/05/2024 07:45:00 PM TECHNIQUE: Contrast-enhanced computed tomography angiography of the lower extremity was performed with multiplanar reconstructions. Maximum intensity projection images were created on a separate workstation and reviewed. Automated exposure control, iterative reconstruction, and/or weight based adjustment of the mA/kV was utilized to reduce the radiation dose to as low as reasonably achievable. 100 mL of iohexol  (OMNIPAQUE ) 350 MG/ML injection was administered. COMPARISON: 10/15/2024 CLINICAL HISTORY: Left lower extremity toe is purple, history of arterial occlusion, no pulses. Abnormal pulse exam with no pulses in the left foot, left foot digital cyanosis. FINDINGS: ARTERIAL: LEFT COMMON ILIAC ARTERY: Widely patent. LEFT EXTERNAL ILIAC ARTERY: Patent. COMMON FEMORAL ARTERY: Left: No hemodynamically significant stenosis (less than 50% stenosis noted just proximal to the bypass graft, but not reported as per instructions). Right: The  right common femoral artery is patent. RIGHT COMMON ILIAC ARTERY: Stented and chronically occluded. RIGHT INTERNAL ILIAC ARTERY: Proximally occluded and reconstituted via iliolumbar collaterals. RIGHT EXTERNAL ILIAC ARTERY: Patent. FEMOROFEMORAL BYPASS GRAFT: Femorofemoral bypass grafting is again identified and is widely patent. SUPERFICIAL FEMORAL ARTERY: Left: Interval recanalization. Now demonstrates patency with a focal 50% stenosis within the adductor canal (202/4). Right: Patent but demonstrates a focal 50% stenosis within the adductor canal (202/4). PROFUNDA FEMORAL ARTERY: Left: Abrupt occlusion just distal to its second muscular perforating branch, unchanged from prior examination. Right: Abrupt occlusion just distal to its second muscular perforating branch, unchanged from prior examination. POPLITEAL ARTERY: Left: Widely patent. Right: Wide patency. TIBIOPERONEAL TRUNK: Left: The tibioperoneal trunk is patent. Right: The tibioperoneal trunk is patent. ANTERIOR TIBIAL ARTERY: Left: Occluded shortly after its takeoff. Right: Occluded shortly after its takeoff. PERONEAL ARTERY: Left: Does not clearly reconstitute the dorsalis pedis artery though this may be related to relatively delayed enhancement of the left foot in relation to the right. Right: Reconstitutes the dorsalis pedis artery and lateral calcaneal artery. POSTERIOR TIBIAL ARTERY: Left: Patent and forms the plantar arch. Right: Forms the plantar arch. BONES AND SOFT TISSUES: No significant osseous or soft tissue abnormalities seen within the field of view. IMPRESSION: 1. Left lower extremity arterial inflow is widely patent, with a widely patent femorofemoral bypass graft. 2. Left lower extremity arterial outflow demonstrates interval recanalization with patency of the superficial femoral artery, focal 50% stenosis within the adductor canal, and chronic abrupt occlusion of the profunda femoral artery just distal to its second muscular perforating  branch. 3. Left lower extremity arterial runoff demonstrates occlusion of the anterior tibial artery shortly after its takeoff with 2-vessel runoff to the ankle via the peroneal and posterior tibial arteries, without clear reconstitution of the dorsalis pedis artery from the peroneal artery, which may be related to relatively delayed enhancement. The plantar arch is patent. 4. Right lower extremity arterial inflow is stented and chronically occluded, with proximal occlusion of the right internal iliac artery and reconstitution via iliolumbar collaterals. 5. Right lower extremity arterial outflow is patent with focal 50% stenosis within the adductor canal and a widely patent popliteal artery. 6. Right lower extremity arterial runoff demonstrates occlusion of the anterior tibial artery shortly after its takeoff with 2-vessel runoff to the ankle via the peroneal and posterior tibial arteries, with reconstitution of the dorsalis pedis artery and plantar arch . Electronically signed by: Dorethia Molt MD 12/05/2024 08:01 PM EST RP Workstation: [id number]  DG Toe Great Left Result Date: 12/05/2024 CLINICAL DATA:  Pain and discoloration of the LEFT great toe EXAM: LEFT GREAT TOE COMPARISON:  LEFT foot radiographs 10/15/2024 FINDINGS: Mild diffuse soft tissue swelling of the great toe without underlying fracture or dislocation. No radiopaque foreign bodies. Mild hallux valgus deformity of the great toe. Mild degenerative changes of the first metatarsophalangeal joint. IMPRESSION: Mild diffuse soft tissue swelling of the LEFT great toe, without underlying fracture or dislocation. Electronically Signed   By: Aliene Lloyd M.D.   On: 12/05/2024 13:47    Microbiology: Results for orders placed or performed during the hospital encounter of 10/15/24  MRSA Next Gen by PCR, Nasal     Status: None   Collection Time: 10/16/24  5:46 PM   Specimen: Nasal Mucosa; Nasal Swab  Result Value Ref Range Status   MRSA by PCR Next  Gen NOT DETECTED NOT DETECTED Final    Comment: (NOTE) The GeneXpert MRSA Assay (FDA approved for NASAL specimens only), is one component of a comprehensive MRSA colonization surveillance program. It is not intended to diagnose MRSA infection nor to guide or monitor treatment for MRSA infections. Test performance is not FDA approved in patients less than 93 years old. Performed at [facility], [address] [place], [place] [address]     Labs: CBC: Recent Labs  Lab 12/05/24 1416 12/06/24 0343 12/07/24 0137 12/08/24 0316  WBC 8.8 6.7 6.4 6.6  NEUTROABS 5.4  --  3.4  --   HGB 14.3 13.4 13.1 12.2  HCT 43.8 39.7 38.9 36.9  MCV 89.0 86.3 86.8 87.4  PLT 186 159 152 149*   Basic Metabolic Panel: Recent Labs  Lab 12/05/24 1416 12/06/24 0343 12/07/24 0137  NA 140 137 137  K 5.0 3.4* 3.8  CL 102 103 103  CO2 27 26 23   GLUCOSE 138* 183* 169*  BUN 13 10 14   CREATININE 0.97 0.88 1.01*  CALCIUM  9.2 8.8* 8.6*  MG  --   --  1.7  PHOS  --   --  3.0   Liver Function Tests: Recent Labs  Lab 12/06/24 0343  AST 13*  ALT 8  ALKPHOS 84  BILITOT <0.2  PROT 6.5  ALBUMIN 3.3*   CBG: Recent Labs  Lab 12/07/24 1231 12/07/24 1314 12/07/24 1601 12/07/24 2104 12/08/24 0543  GLUCAP 128* 77 116* 189* 178*    Discharge time spent: 40 minutes.  Signed: Deliliah Room, MD [facility] 12/08/2024 "

## 2024-12-08 NOTE — Progress Notes (Addendum)
 DISCHARGE NOTE HOME Alice Bennett to be discharged Home per MD order. Discussed prescriptions and follow up appointments with the patient. Prescriptions given to patient; medication list explained in detail. Patient verbalized understanding.  Reviewed stoke educatioon verbally  Skin clean, dry and intact without evidence of skin break down, no evidence of skin tears noted. IV catheter discontinued intact. Site without signs and symptoms of complications. Dressing and pressure applied. Pt denies pain at the site currently. No complaints noted.  See LDA for wound left buttock Patient free of lines, drains, and wounds.   An After Visit Summary (AVS) was printed and given to the patient. Patient escorted via wheelchair, and discharged home via private auto.  [name], RN

## 2024-12-08 NOTE — Plan of Care (Signed)
?  Problem: Clinical Measurements: ?Goal: Will remain free from infection ?Outcome: Progressing ?Goal: Diagnostic test results will improve ?Outcome: Progressing ?Goal: Respiratory complications will improve ?Outcome: Progressing ?  ?

## 2024-12-08 NOTE — Discharge Instructions (Addendum)

## 2024-12-08 NOTE — Plan of Care (Signed)
" °  Problem: Clinical Measurements: Goal: Will remain free from infection 12/08/2024 0446 by Neville Arland SAUNDERS, RN Outcome: Progressing 12/08/2024 0444 by Neville Arland SAUNDERS, RN Outcome: Progressing Goal: Diagnostic test results will improve Outcome: Progressing Goal: Respiratory complications will improve Outcome: Progressing   "

## 2024-12-08 NOTE — TOC Transition Note (Signed)
 Transition of Care [facility]) - Discharge Note   Patient Details  Name: Alice Bennett MRN: [id number] Date of Birth: 05-Jun-1961  Transition of Care [facility]) CM/SW Contact:  Tom-Johnson, Harvest Muskrat, RN Phone Number: 12/08/2024, 9:36 AM   Clinical Narrative:     Patient is scheduled for discharge today.  Readmission Risk Assessment done. Outpatient f/u, hospital f/u and discharge instructions on AVS. Prescriptions sent to [facility] pharmacy and patient will receive meds prior discharge. No ICM needs or recommendations noted. Alyse Craze will transport at discharge.  No further ICM needs noted.      Final next level of care: Home/Self Care Barriers to Discharge: Barriers Resolved   Patient Goals and CMS Choice Patient states their goals for this hospitalization and ongoing recovery are:: To return home CMS Medicare.gov Compare Post Acute Care list provided to:: Patient Choice offered to / list presented to : NA      Discharge Placement                Patient to be transferred to facility by: Alyse Craze      Discharge Plan and Services Additional resources added to the After Visit Summary for                  DME Arranged: N/A DME Agency: NA       HH Arranged: NA HH Agency: NA        Social Drivers of Health (SDOH) Interventions SDOH Screenings   Food Insecurity: No Food Insecurity (12/05/2024)  Housing: Low Risk (12/05/2024)  Recent Concern: Housing - High Risk (10/16/2024)  Transportation Needs: No Transportation Needs (12/05/2024)  Utilities: Not At Risk (12/05/2024)  Stress: No Stress Concern Present (08/09/2024)   Received from [facility]  Tobacco Use: Medium Risk (12/05/2024)     Readmission Risk Interventions    12/08/2024    9:34 AM 05/06/2023    4:37 PM  Readmission Risk Prevention Plan  Transportation Screening Complete Complete  PCP or Specialist Appt within 5-7 Days Complete Complete  Home Care Screening Complete Complete  Medication  Review (RN CM) Referral to Pharmacy Complete

## 2024-12-17 ENCOUNTER — Ambulatory Visit: Admitting: Pharmacist

## 2025-01-07 ENCOUNTER — Encounter
